# Patient Record
Sex: Female | Born: 1950 | Race: White | Hispanic: No | Marital: Married | Smoking: Never smoker
Health system: Southern US, Community
[De-identification: ages and names within clinical notes are randomized; demographics above are authoritative.]

## PROBLEM LIST (undated history)

## (undated) DIAGNOSIS — J189 Pneumonia, unspecified organism: Secondary | ICD-10-CM

## (undated) DIAGNOSIS — R0602 Shortness of breath: Secondary | ICD-10-CM

## (undated) DIAGNOSIS — I341 Nonrheumatic mitral (valve) prolapse: Secondary | ICD-10-CM

## (undated) DIAGNOSIS — R296 Repeated falls: Secondary | ICD-10-CM

## (undated) DIAGNOSIS — Z87898 Personal history of other specified conditions: Secondary | ICD-10-CM

## (undated) DIAGNOSIS — R0600 Dyspnea, unspecified: Secondary | ICD-10-CM

## (undated) DIAGNOSIS — R5383 Other fatigue: Secondary | ICD-10-CM

## (undated) DIAGNOSIS — K219 Gastro-esophageal reflux disease without esophagitis: Secondary | ICD-10-CM

## (undated) DIAGNOSIS — M3500 Sicca syndrome, unspecified: Secondary | ICD-10-CM

## (undated) DIAGNOSIS — R208 Other disturbances of skin sensation: Secondary | ICD-10-CM

## (undated) DIAGNOSIS — R911 Solitary pulmonary nodule: Secondary | ICD-10-CM

## (undated) DIAGNOSIS — J45909 Unspecified asthma, uncomplicated: Secondary | ICD-10-CM

## (undated) DIAGNOSIS — N319 Neuromuscular dysfunction of bladder, unspecified: Secondary | ICD-10-CM

## (undated) DIAGNOSIS — Z8489 Family history of other specified conditions: Secondary | ICD-10-CM

## (undated) DIAGNOSIS — G373 Acute transverse myelitis in demyelinating disease of central nervous system: Secondary | ICD-10-CM

## (undated) DIAGNOSIS — R112 Nausea with vomiting, unspecified: Secondary | ICD-10-CM

## (undated) DIAGNOSIS — R011 Cardiac murmur, unspecified: Secondary | ICD-10-CM

## (undated) DIAGNOSIS — R159 Full incontinence of feces: Secondary | ICD-10-CM

## (undated) DIAGNOSIS — R261 Paralytic gait: Secondary | ICD-10-CM

## (undated) DIAGNOSIS — Z978 Presence of other specified devices: Secondary | ICD-10-CM

## (undated) DIAGNOSIS — Z860100 Personal history of colon polyps, unspecified: Secondary | ICD-10-CM

## (undated) DIAGNOSIS — G35 Multiple sclerosis: Secondary | ICD-10-CM

## (undated) DIAGNOSIS — Z8719 Personal history of other diseases of the digestive system: Secondary | ICD-10-CM

## (undated) DIAGNOSIS — Z9889 Other specified postprocedural states: Secondary | ICD-10-CM

## (undated) DIAGNOSIS — M1990 Unspecified osteoarthritis, unspecified site: Secondary | ICD-10-CM

## (undated) DIAGNOSIS — G43909 Migraine, unspecified, not intractable, without status migrainosus: Secondary | ICD-10-CM

## (undated) HISTORY — PX: HEMORROIDECTOMY: SUR656

## (undated) HISTORY — PX: BREAST ENHANCEMENT SURGERY: SHX7

## (undated) HISTORY — PX: ANTERIOR CERVICAL DECOMP/DISCECTOMY FUSION: SHX1161

## (undated) HISTORY — DX: Acute transverse myelitis in demyelinating disease of central nervous system: G37.3

## (undated) HISTORY — DX: Multiple sclerosis: G35

## (undated) HISTORY — PX: TONSILLECTOMY: SUR1361

## (undated) HISTORY — PX: BUNIONECTOMY: SHX129

## (undated) HISTORY — PX: OTHER SURGICAL HISTORY: SHX169

## (undated) HISTORY — PX: SHOULDER ARTHROSCOPY: SHX128

## (undated) HISTORY — PX: ELBOW SURGERY: SHX618

---

## 1979-12-28 HISTORY — PX: VAGINAL HYSTERECTOMY: SUR661

## 1980-12-27 HISTORY — PX: BILATERAL SALPINGOOPHORECTOMY: SHX1223

## 1987-12-28 DIAGNOSIS — G35 Multiple sclerosis: Secondary | ICD-10-CM

## 1987-12-28 HISTORY — DX: Multiple sclerosis: G35

## 1992-12-27 HISTORY — PX: BLADDER SUSPENSION: SHX72

## 1998-03-27 ENCOUNTER — Encounter: Admission: RE | Admit: 1998-03-27 | Discharge: 1998-06-25 | Payer: Self-pay | Admitting: Family Medicine

## 1998-06-26 ENCOUNTER — Encounter: Admission: RE | Admit: 1998-06-26 | Discharge: 1998-09-24 | Payer: Self-pay | Admitting: Family Medicine

## 1998-11-05 ENCOUNTER — Other Ambulatory Visit: Admission: RE | Admit: 1998-11-05 | Discharge: 1998-11-05 | Payer: Self-pay | Admitting: *Deleted

## 1999-12-22 ENCOUNTER — Other Ambulatory Visit: Admission: RE | Admit: 1999-12-22 | Discharge: 1999-12-22 | Payer: Self-pay | Admitting: *Deleted

## 2000-05-11 ENCOUNTER — Ambulatory Visit (HOSPITAL_COMMUNITY): Admission: RE | Admit: 2000-05-11 | Discharge: 2000-05-11 | Payer: Self-pay | Admitting: Gastroenterology

## 2000-12-23 ENCOUNTER — Other Ambulatory Visit: Admission: RE | Admit: 2000-12-23 | Discharge: 2000-12-23 | Payer: Self-pay | Admitting: *Deleted

## 2002-01-04 ENCOUNTER — Other Ambulatory Visit: Admission: RE | Admit: 2002-01-04 | Discharge: 2002-01-04 | Payer: Self-pay | Admitting: *Deleted

## 2002-10-07 ENCOUNTER — Emergency Department (HOSPITAL_COMMUNITY): Admission: EM | Admit: 2002-10-07 | Discharge: 2002-10-07 | Payer: Self-pay | Admitting: Emergency Medicine

## 2003-01-07 ENCOUNTER — Other Ambulatory Visit: Admission: RE | Admit: 2003-01-07 | Discharge: 2003-01-07 | Payer: Self-pay | Admitting: Obstetrics & Gynecology

## 2003-05-08 ENCOUNTER — Inpatient Hospital Stay (HOSPITAL_COMMUNITY): Admission: RE | Admit: 2003-05-08 | Discharge: 2003-05-09 | Payer: Self-pay | Admitting: Neurosurgery

## 2003-05-08 ENCOUNTER — Encounter: Payer: Self-pay | Admitting: Neurosurgery

## 2003-07-10 ENCOUNTER — Ambulatory Visit (HOSPITAL_COMMUNITY): Admission: RE | Admit: 2003-07-10 | Discharge: 2003-07-10 | Payer: Self-pay | Admitting: Neurosurgery

## 2003-07-10 ENCOUNTER — Encounter: Payer: Self-pay | Admitting: Neurosurgery

## 2003-12-13 ENCOUNTER — Ambulatory Visit (HOSPITAL_COMMUNITY): Admission: RE | Admit: 2003-12-13 | Discharge: 2003-12-13 | Payer: Self-pay | Admitting: Neurosurgery

## 2004-01-10 ENCOUNTER — Other Ambulatory Visit: Admission: RE | Admit: 2004-01-10 | Discharge: 2004-01-10 | Payer: Self-pay | Admitting: Obstetrics and Gynecology

## 2004-04-28 ENCOUNTER — Encounter: Admission: RE | Admit: 2004-04-28 | Discharge: 2004-06-25 | Payer: Self-pay | Admitting: *Deleted

## 2004-12-03 ENCOUNTER — Encounter: Admission: RE | Admit: 2004-12-03 | Discharge: 2005-01-11 | Payer: Self-pay | Admitting: Neurology

## 2005-01-20 ENCOUNTER — Ambulatory Visit (HOSPITAL_COMMUNITY): Admission: RE | Admit: 2005-01-20 | Discharge: 2005-01-20 | Payer: Self-pay | Admitting: Neurology

## 2005-02-12 ENCOUNTER — Ambulatory Visit (HOSPITAL_COMMUNITY): Admission: RE | Admit: 2005-02-12 | Discharge: 2005-02-12 | Payer: Self-pay | Admitting: Neurosurgery

## 2005-02-15 ENCOUNTER — Inpatient Hospital Stay (HOSPITAL_COMMUNITY): Admission: RE | Admit: 2005-02-15 | Discharge: 2005-02-17 | Payer: Self-pay | Admitting: Neurosurgery

## 2005-03-25 ENCOUNTER — Ambulatory Visit (HOSPITAL_COMMUNITY): Admission: RE | Admit: 2005-03-25 | Discharge: 2005-03-25 | Payer: Self-pay | Admitting: Neurosurgery

## 2005-04-21 ENCOUNTER — Encounter: Admission: RE | Admit: 2005-04-21 | Discharge: 2005-07-12 | Payer: Self-pay | Admitting: Neurology

## 2005-08-11 ENCOUNTER — Ambulatory Visit (HOSPITAL_COMMUNITY): Admission: RE | Admit: 2005-08-11 | Discharge: 2005-08-11 | Payer: Self-pay | Admitting: Neurosurgery

## 2005-10-13 ENCOUNTER — Ambulatory Visit (HOSPITAL_COMMUNITY): Admission: RE | Admit: 2005-10-13 | Discharge: 2005-10-13 | Payer: Self-pay | Admitting: Orthopedic Surgery

## 2005-10-13 ENCOUNTER — Ambulatory Visit (HOSPITAL_BASED_OUTPATIENT_CLINIC_OR_DEPARTMENT_OTHER): Admission: RE | Admit: 2005-10-13 | Discharge: 2005-10-13 | Payer: Self-pay | Admitting: Orthopedic Surgery

## 2005-11-02 ENCOUNTER — Encounter: Admission: RE | Admit: 2005-11-02 | Discharge: 2006-01-05 | Payer: Self-pay | Admitting: Orthopedic Surgery

## 2006-05-24 ENCOUNTER — Ambulatory Visit (HOSPITAL_COMMUNITY): Admission: EM | Admit: 2006-05-24 | Discharge: 2006-05-24 | Payer: Self-pay | Admitting: *Deleted

## 2006-07-21 ENCOUNTER — Ambulatory Visit (HOSPITAL_COMMUNITY): Admission: RE | Admit: 2006-07-21 | Discharge: 2006-07-21 | Payer: Self-pay | Admitting: Neurosurgery

## 2006-10-25 ENCOUNTER — Emergency Department (HOSPITAL_COMMUNITY): Admission: EM | Admit: 2006-10-25 | Discharge: 2006-10-26 | Payer: Self-pay | Admitting: Emergency Medicine

## 2007-07-12 ENCOUNTER — Encounter: Admission: RE | Admit: 2007-07-12 | Discharge: 2007-08-24 | Payer: Self-pay | Admitting: Neurology

## 2007-07-24 ENCOUNTER — Ambulatory Visit (HOSPITAL_COMMUNITY): Admission: RE | Admit: 2007-07-24 | Discharge: 2007-07-24 | Payer: Self-pay | Admitting: Neurology

## 2007-08-24 ENCOUNTER — Encounter: Admission: RE | Admit: 2007-08-24 | Discharge: 2007-08-24 | Payer: Self-pay | Admitting: Neurology

## 2007-09-21 ENCOUNTER — Ambulatory Visit (HOSPITAL_BASED_OUTPATIENT_CLINIC_OR_DEPARTMENT_OTHER): Admission: RE | Admit: 2007-09-21 | Discharge: 2007-09-21 | Payer: Self-pay | Admitting: Orthopedic Surgery

## 2007-09-21 HISTORY — PX: SHOULDER ARTHROSCOPY WITH DISTAL CLAVICLE RESECTION: SHX5675

## 2007-09-28 ENCOUNTER — Encounter: Admission: RE | Admit: 2007-09-28 | Discharge: 2007-10-25 | Payer: Self-pay | Admitting: Orthopedic Surgery

## 2007-10-26 ENCOUNTER — Encounter: Admission: RE | Admit: 2007-10-26 | Discharge: 2007-11-21 | Payer: Self-pay | Admitting: Orthopedic Surgery

## 2007-11-22 ENCOUNTER — Encounter: Admission: RE | Admit: 2007-11-22 | Discharge: 2007-12-27 | Payer: Self-pay | Admitting: Orthopedic Surgery

## 2007-12-25 ENCOUNTER — Encounter: Admission: RE | Admit: 2007-12-25 | Discharge: 2007-12-25 | Payer: Self-pay | Admitting: Gastroenterology

## 2007-12-27 ENCOUNTER — Encounter: Admission: RE | Admit: 2007-12-27 | Discharge: 2007-12-27 | Payer: Self-pay | Admitting: Neurosurgery

## 2008-01-18 ENCOUNTER — Encounter: Admission: RE | Admit: 2008-01-18 | Discharge: 2008-03-04 | Payer: Self-pay | Admitting: Neurology

## 2008-02-05 ENCOUNTER — Encounter: Admission: RE | Admit: 2008-02-05 | Discharge: 2008-02-05 | Payer: Self-pay | Admitting: Orthopedic Surgery

## 2008-03-13 ENCOUNTER — Ambulatory Visit: Payer: Self-pay | Admitting: Internal Medicine

## 2008-03-13 DIAGNOSIS — R059 Cough, unspecified: Secondary | ICD-10-CM | POA: Insufficient documentation

## 2008-03-13 DIAGNOSIS — G43909 Migraine, unspecified, not intractable, without status migrainosus: Secondary | ICD-10-CM

## 2008-03-13 DIAGNOSIS — J310 Chronic rhinitis: Secondary | ICD-10-CM

## 2008-03-13 DIAGNOSIS — G35 Multiple sclerosis: Secondary | ICD-10-CM

## 2008-03-13 DIAGNOSIS — R05 Cough: Secondary | ICD-10-CM

## 2008-03-13 HISTORY — DX: Chronic rhinitis: J31.0

## 2008-03-13 HISTORY — DX: Migraine, unspecified, not intractable, without status migrainosus: G43.909

## 2008-03-18 ENCOUNTER — Encounter: Payer: Self-pay | Admitting: Internal Medicine

## 2008-05-16 ENCOUNTER — Encounter: Payer: Self-pay | Admitting: Internal Medicine

## 2008-05-28 ENCOUNTER — Ambulatory Visit (HOSPITAL_COMMUNITY): Admission: RE | Admit: 2008-05-28 | Discharge: 2008-05-28 | Payer: Self-pay | Admitting: Gastroenterology

## 2008-06-04 ENCOUNTER — Encounter: Payer: Self-pay | Admitting: Internal Medicine

## 2008-06-24 ENCOUNTER — Encounter: Admission: RE | Admit: 2008-06-24 | Discharge: 2008-06-24 | Payer: Self-pay | Admitting: Sports Medicine

## 2008-07-02 ENCOUNTER — Encounter: Admission: RE | Admit: 2008-07-02 | Discharge: 2008-09-11 | Payer: Self-pay | Admitting: Sports Medicine

## 2008-07-10 ENCOUNTER — Encounter: Admission: RE | Admit: 2008-07-10 | Discharge: 2008-07-10 | Payer: Self-pay | Admitting: Sports Medicine

## 2008-08-12 ENCOUNTER — Inpatient Hospital Stay (HOSPITAL_COMMUNITY): Admission: EM | Admit: 2008-08-12 | Discharge: 2008-08-13 | Payer: Self-pay | Admitting: Emergency Medicine

## 2008-09-10 ENCOUNTER — Ambulatory Visit: Payer: Self-pay | Admitting: Critical Care Medicine

## 2008-09-10 DIAGNOSIS — J441 Chronic obstructive pulmonary disease with (acute) exacerbation: Secondary | ICD-10-CM

## 2008-09-10 DIAGNOSIS — K219 Gastro-esophageal reflux disease without esophagitis: Secondary | ICD-10-CM | POA: Insufficient documentation

## 2008-09-10 HISTORY — DX: Gastro-esophageal reflux disease without esophagitis: K21.9

## 2008-09-10 HISTORY — DX: Chronic obstructive pulmonary disease with (acute) exacerbation: J44.1

## 2008-09-11 ENCOUNTER — Encounter: Payer: Self-pay | Admitting: Critical Care Medicine

## 2008-09-24 ENCOUNTER — Ambulatory Visit: Payer: Self-pay | Admitting: Critical Care Medicine

## 2008-09-24 DIAGNOSIS — R0602 Shortness of breath: Secondary | ICD-10-CM | POA: Insufficient documentation

## 2008-09-25 ENCOUNTER — Encounter: Payer: Self-pay | Admitting: Critical Care Medicine

## 2008-10-14 ENCOUNTER — Ambulatory Visit: Payer: Self-pay | Admitting: Critical Care Medicine

## 2008-10-24 ENCOUNTER — Telehealth (INDEPENDENT_AMBULATORY_CARE_PROVIDER_SITE_OTHER): Payer: Self-pay | Admitting: *Deleted

## 2008-10-24 ENCOUNTER — Ambulatory Visit: Payer: Self-pay | Admitting: Internal Medicine

## 2008-11-01 ENCOUNTER — Ambulatory Visit (HOSPITAL_COMMUNITY): Admission: RE | Admit: 2008-11-01 | Discharge: 2008-11-01 | Payer: Self-pay | Admitting: Obstetrics

## 2008-11-07 ENCOUNTER — Encounter: Admission: RE | Admit: 2008-11-07 | Discharge: 2008-12-06 | Payer: Self-pay | Admitting: Sports Medicine

## 2008-12-03 HISTORY — PX: BREAST IMPLANT REMOVAL: SHX5361

## 2009-01-17 ENCOUNTER — Encounter: Admission: RE | Admit: 2009-01-17 | Discharge: 2009-01-17 | Payer: Self-pay | Admitting: Neurosurgery

## 2009-01-17 ENCOUNTER — Encounter: Payer: Self-pay | Admitting: Critical Care Medicine

## 2009-01-21 ENCOUNTER — Encounter: Payer: Self-pay | Admitting: Critical Care Medicine

## 2009-01-22 ENCOUNTER — Ambulatory Visit: Payer: Self-pay | Admitting: Critical Care Medicine

## 2009-01-22 DIAGNOSIS — J984 Other disorders of lung: Secondary | ICD-10-CM

## 2009-01-22 DIAGNOSIS — R911 Solitary pulmonary nodule: Secondary | ICD-10-CM | POA: Insufficient documentation

## 2009-01-22 HISTORY — DX: Solitary pulmonary nodule: R91.1

## 2009-04-03 ENCOUNTER — Encounter: Admission: RE | Admit: 2009-04-03 | Discharge: 2009-04-03 | Payer: Self-pay | Admitting: Gastroenterology

## 2009-04-07 ENCOUNTER — Ambulatory Visit: Payer: Self-pay | Admitting: Internal Medicine

## 2009-04-15 ENCOUNTER — Ambulatory Visit: Payer: Self-pay | Admitting: Critical Care Medicine

## 2009-06-18 ENCOUNTER — Ambulatory Visit: Payer: Self-pay | Admitting: Critical Care Medicine

## 2009-08-25 ENCOUNTER — Encounter: Admission: RE | Admit: 2009-08-25 | Discharge: 2009-11-23 | Payer: Self-pay | Admitting: Neurology

## 2009-09-15 ENCOUNTER — Encounter: Payer: Self-pay | Admitting: Critical Care Medicine

## 2009-10-15 ENCOUNTER — Ambulatory Visit: Payer: Self-pay | Admitting: Genetic Counselor

## 2009-10-21 ENCOUNTER — Ambulatory Visit: Payer: Self-pay | Admitting: Critical Care Medicine

## 2009-10-21 DIAGNOSIS — R1312 Dysphagia, oropharyngeal phase: Secondary | ICD-10-CM | POA: Insufficient documentation

## 2009-10-21 DIAGNOSIS — J383 Other diseases of vocal cords: Secondary | ICD-10-CM

## 2009-10-21 HISTORY — DX: Other diseases of vocal cords: J38.3

## 2009-10-21 HISTORY — DX: Dysphagia, oropharyngeal phase: R13.12

## 2009-10-29 ENCOUNTER — Telehealth: Payer: Self-pay | Admitting: Critical Care Medicine

## 2009-10-30 ENCOUNTER — Encounter: Payer: Self-pay | Admitting: Critical Care Medicine

## 2009-10-30 ENCOUNTER — Ambulatory Visit (HOSPITAL_COMMUNITY): Admission: RE | Admit: 2009-10-30 | Discharge: 2009-10-30 | Payer: Self-pay | Admitting: Critical Care Medicine

## 2009-11-03 ENCOUNTER — Encounter: Payer: Self-pay | Admitting: Critical Care Medicine

## 2009-11-07 ENCOUNTER — Ambulatory Visit (HOSPITAL_COMMUNITY): Admission: RE | Admit: 2009-11-07 | Discharge: 2009-11-07 | Payer: Self-pay | Admitting: Obstetrics

## 2009-11-24 ENCOUNTER — Encounter: Admission: RE | Admit: 2009-11-24 | Discharge: 2009-12-26 | Payer: Self-pay | Admitting: Critical Care Medicine

## 2009-12-05 ENCOUNTER — Encounter: Payer: Self-pay | Admitting: Critical Care Medicine

## 2009-12-25 ENCOUNTER — Telehealth: Payer: Self-pay | Admitting: Critical Care Medicine

## 2009-12-27 DIAGNOSIS — C801 Malignant (primary) neoplasm, unspecified: Secondary | ICD-10-CM

## 2009-12-27 HISTORY — DX: Malignant (primary) neoplasm, unspecified: C80.1

## 2010-01-03 ENCOUNTER — Encounter: Admission: RE | Admit: 2010-01-03 | Discharge: 2010-01-03 | Payer: Self-pay | Admitting: Psychiatry

## 2010-01-07 ENCOUNTER — Telehealth: Payer: Self-pay | Admitting: Critical Care Medicine

## 2010-01-07 ENCOUNTER — Encounter: Admission: RE | Admit: 2010-01-07 | Discharge: 2010-04-07 | Payer: Self-pay | Admitting: Critical Care Medicine

## 2010-01-15 ENCOUNTER — Encounter: Payer: Self-pay | Admitting: Critical Care Medicine

## 2010-01-15 ENCOUNTER — Ambulatory Visit
Admission: RE | Admit: 2010-01-15 | Discharge: 2010-01-15 | Payer: Self-pay | Source: Home / Self Care | Admitting: Critical Care Medicine

## 2010-01-23 ENCOUNTER — Encounter: Admission: RE | Admit: 2010-01-23 | Discharge: 2010-02-04 | Payer: Self-pay | Admitting: Neurology

## 2010-01-30 ENCOUNTER — Encounter: Payer: Self-pay | Admitting: Critical Care Medicine

## 2010-04-14 ENCOUNTER — Encounter (HOSPITAL_COMMUNITY): Admission: RE | Admit: 2010-04-14 | Discharge: 2010-07-13 | Payer: Self-pay | Admitting: Neurology

## 2010-04-21 ENCOUNTER — Telehealth: Payer: Self-pay | Admitting: Critical Care Medicine

## 2010-04-22 ENCOUNTER — Ambulatory Visit: Payer: Self-pay | Admitting: Critical Care Medicine

## 2010-08-04 ENCOUNTER — Encounter (HOSPITAL_COMMUNITY): Admission: RE | Admit: 2010-08-04 | Discharge: 2010-11-02 | Payer: Self-pay | Admitting: Neurology

## 2010-08-05 ENCOUNTER — Ambulatory Visit (HOSPITAL_COMMUNITY): Admission: RE | Admit: 2010-08-05 | Discharge: 2010-08-05 | Payer: Self-pay | Admitting: General Surgery

## 2010-10-02 ENCOUNTER — Encounter: Admission: RE | Admit: 2010-10-02 | Discharge: 2010-10-02 | Payer: Self-pay | Admitting: Neurology

## 2010-11-25 ENCOUNTER — Encounter (HOSPITAL_COMMUNITY)
Admission: RE | Admit: 2010-11-25 | Discharge: 2011-01-26 | Payer: Self-pay | Source: Home / Self Care | Attending: Neurology | Admitting: Neurology

## 2010-12-16 ENCOUNTER — Encounter
Admission: RE | Admit: 2010-12-16 | Discharge: 2010-12-24 | Payer: Self-pay | Source: Home / Self Care | Attending: Sports Medicine | Admitting: Sports Medicine

## 2010-12-29 ENCOUNTER — Encounter
Admission: RE | Admit: 2010-12-29 | Discharge: 2011-01-26 | Payer: Self-pay | Source: Home / Self Care | Attending: Sports Medicine | Admitting: Sports Medicine

## 2011-01-07 ENCOUNTER — Encounter
Admission: RE | Admit: 2011-01-07 | Discharge: 2011-01-26 | Payer: Self-pay | Source: Home / Self Care | Attending: Obstetrics and Gynecology | Admitting: Obstetrics and Gynecology

## 2011-01-07 ENCOUNTER — Encounter: Admit: 2011-01-07 | Payer: Self-pay | Admitting: Sports Medicine

## 2011-01-12 ENCOUNTER — Encounter: Admit: 2011-01-12 | Payer: Self-pay | Admitting: Sports Medicine

## 2011-01-15 ENCOUNTER — Encounter: Admit: 2011-01-15 | Payer: Self-pay | Admitting: Sports Medicine

## 2011-01-17 ENCOUNTER — Encounter: Payer: Self-pay | Admitting: Critical Care Medicine

## 2011-01-17 ENCOUNTER — Encounter: Payer: Self-pay | Admitting: Neurology

## 2011-01-26 NOTE — Progress Notes (Signed)
Summary: appointment  Phone Note Call from Patient   Caller: x-ray//[name] Call For: [name] Summary of Call: FYI:  Patient cancelled her appointment yesterday 1/11. Initial call taken by: Darletta Moll,  January 07, 2010 11:25 AM  Follow-up for Phone Call        will forward to PW as FYI.  Gweneth Dimitri RN  January 07, 2010 12:30 PM   Additional Follow-up for Phone Call Additional follow up Details #1::        noted   Additional Follow-up by: Storm Frisk MD,  January 07, 2010 1:45 PM

## 2011-01-26 NOTE — Progress Notes (Signed)
Summary: appt  Phone Note Call from Patient Call back at [phone]   Caller: Patient Call For: Suhan Paci Reason for Call: Talk to Nurse Summary of Call: pt c/o cough, more she talks more cough, just started tysabri infusion, have pockets of emphysema.  Feels like she's in a smoke filled room.  Nothing has helped.  Need to see PW.  Need to make sure she isn't contagious. Initial call taken by: Eugene Gavia,  April 21, 2010 8:08 AM  Follow-up for Phone Call        Pt c/o productive cough with white phlegm, increased SOB, chest congestion x 3 days.  Pt states she is on Tysabri infusion for her MS and she has to be careful what meds she takes. She states Dr. Sandria Manly cleared her to take Sudafed, but this has not helped. SHe denies fever. She is asking for rx for this as well as wants to know is she contagious? She has a sister going through chemo and she wants to know should she stay away form her? Also pt states she cannot take prednisone 2 weeks before an infusion or she will have to skip her treatment. her next infusion date is May 17th.  Please advise.  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 She is scheduled to see MW tomorrow 04/22/10 at 11:55. pt aware if gets worse to call our office or go to ER. Carron Curie CMA  April 21, 2010 10:02 AM

## 2011-01-26 NOTE — Miscellaneous (Signed)
Summary: Discharge Summary/[facility]  Discharge Summary/[facility]   Imported By: Sherian Rein 02/17/2010 07:27:16  _____________________________________________________________________  External Attachment:    Type:   Image     Comment:   External Document

## 2011-01-26 NOTE — Assessment & Plan Note (Signed)
Summary: Pulmonary/ acute exac cough    Copy to:  Avie Echevaria Primary Provider/Referring Provider:  Theresia Lo  CC:  Acute visit.  Pt c/o increased SOB and chest congestion x 8 days.  She states that she gets SOB with just bending over.  She also has had prod cough with yellow sputum.  Denies fever.Marland Kitchen  History of Present Illness: 69  yowf never smoker with MS since 1989 weak tingling bladder incont  New onset cough 3/09 felt initially to be upper airway syndrome rhinitis vs GERD.   June 18, 2009 Since last ov heartburn and cough are much better.  Now off copaxane.  Now is more weak with MS. Dyspnea is stable.  Trying to follow diet better > upper airway symptoms better.   October 21, 2009  dx laryngitis and bronchitis 2 weeks prior to ov per pcp:  rx avelox x 7 days and 6 days of pred dose pak. better,  less hoarse cc cough now is less productive,  heartburn is better  Regarding her MS:  was off mercaptopurine and folic acid   not working ,  more weakness in legs in arms ? new oral meds to be considered per neurology The pt has more difficulty with liquids than solid food and will aspirate and cough after eating. The pt had a swallow eval 2009   April 22, 2010 Acute visit.  Pt c/o increased SOB and chest congestion x 8 days with SOB with just bending over.  She also has had prod cough with yellow sputum.   Pt denies any significant sore throat, dysphagia, itching, sneezing,  nasal congestion or excess secretions,  fever, chills, sweats, unintended wt loss, pleuritic or exertional cp, hempoptysis, change in activity tolerance  orthopnea pnd or leg swelling Pt also denies any obvious fluctuation in symptoms with weather or environmental change or other alleviating or aggravating factors.       Current Medications (verified): 1)  Premarin 0.3 Mg  Tabs (Estrogens Conjugated) .... Once Daily 2)  Loratadine 10 Mg  Tabs (Loratadine) .... Once Daily 3)  Lyrica 50 Mg Caps (Pregabalin) .... 2 By  Mouth At Bedtime 4)  Multivitamins   Tabs (Multiple Vitamin) .... Once Daily 5)  Calcium-Vitamin D 600-125 Mg-Unit  Tabs (Calcium-Vitamin D) .... Three Times A Day 6)  Albuterol Sulfate (2.5 Mg/56ml) 0.083% Nebu (Albuterol Sulfate) .... 4-6 Times Daily As Needed 7)  Reclast 5 Mg/167ml Soln (Zoledronic Acid) .Marland Kitchen.. 1 Injection Yearly 8)  Tysabri 300 Mg/20ml Conc (Natalizumab) .Marland Kitchen.. 1 Infusion Monthly 9)  Omeprazole 40 Mg Cpdr (Omeprazole) .Marland Kitchen.. 1 Two Times A Day 10)  Premarin 0.625 Mg/gm Crea (Estrogens, Conjugated) .... As Directed Twice Weekly 11)  Topiramate 25 Mg Tabs (Topiramate) .... 2 Once Daily 12)  Treximet 85-500 Mg Tabs (Sumatriptan-Naproxen Sodium) .... As Directed As Needed 13)  Meclizine Hcl 25 Mg Chew (Meclizine Hcl) .... 1/2 Three Times A Day As Needed 14)  Symbicort 160-4.5 Mcg/act Aero (Budesonide-Formoterol Fumarate) .... 2 Puffs Two Times A Day 15)  Qvar 40 Mcg/act Aers (Beclomethasone Dipropionate) .... 2 Puffs Every 4-6 Hours As Needed  Allergies (verified): 1)  ! * Betaseron 2)  ! Imitrex 3)  ! * Latex 4)  Codeine  Past History:  Past Medical History: MIGRAINE, CHRONIC (ICD-346.90) MULTIPLE SCLEROSIS (ICD-340)............................................................Marland KitchenLove     -dx 33      - Tysabri started 04/14/2010 [place]  dr Rudy Jew D.....................................................................................................[place]   - Pos  Esophageal stricture Chronic Cough onset 3/09        -  eval 3/09 Kylie Gros---->cyclic cough/GERD likely dx      -  CT Chest  08/12/2008 :   centrilobular emphysema;  mild chronic bronchitic changes only      -  PFT's nl 09/24/08 Palpitations Mitral Valve Prolapse Sjorgren's syndrome    -Dx 2000  Mouth and eyes dry  Vital Signs:  Patient profile:   60 year old female Weight:      149.50 pounds O2 Sat:      100 % on Room air Temp:     97.6 degrees F oral Pulse rate:   87 / minute BP sitting:   128 / 78  (left  arm)  Vitals Entered By: Vernie Murders (April 22, 2010 12:15 PM)  O2 Flow:  Room air  Physical Exam  Additional Exam:  Ambulatory healthy appearing in no acute distress, pseudowheeze resolves with purse lip maneuver  wt 143 > 149 April 22, 2010  HEENT: nl dentition, turbinates, and orophanx. Neck without JVD/Nodes/TM Lungs  minimal bilateral end exp wheezes  RRR no s3 or murmur or increase in P2 Abd soft and benign with nl excursion in the supine position. No bruits or organomegaly Ext warm without calf tenderness, cyanosis clubbing or edema Skin warm and dry without lesions     Impression & Recommendations:  Problem # 1:  COUGH (ICD-786.2)  prob uri vs allergic rhinitis with exac of chronic cough.  Explained natural h/o uri and why it's necessary in patients at risk to rx short term with PPI to reduce risk of evolving cyclical cough triggered by epithelial injury and a heightened sensitivty to the effects of any upper airway irritants,  most importantly acid - related    Each maintenance medication was reviewed in detail including most importantly the difference between maintenance prns and under what circumstances the prns are to be used.  In addition, these two groups (for which the patient should keep up with refills) were distinguished from a third group :  meds that are used only short term with the intent to complete a course of therapy and then not refill them.  The med list was then fully reconciled and reorganized to reflect this important distinction. See instructions for specific recommendations   Orders: Est. Patient Level IV ([address])  Medications Added to Medication List This Visit: 1)  Tysabri 300 Mg/36ml Conc (Natalizumab) .Marland Kitchen.. 1 infusion monthly 2)  Omeprazole 40 Mg Cpdr (Omeprazole) .Marland Kitchen.. 1 two times a day 3)  Omeprazole 40 Mg Cpdr (Omeprazole) .... Take one 30-60 min before first and last meals of the day 4)  Premarin 0.625 Mg/gm Crea (Estrogens, conjugated) ....  As directed twice weekly 5)  Topiramate 25 Mg Tabs (Topiramate) .... 2 once daily 6)  Treximet 85-500 Mg Tabs (Sumatriptan-naproxen sodium) .... As directed as needed 7)  Meclizine Hcl 25 Mg Chew (Meclizine hcl) .... 1/2 three times a day as needed 8)  Symbicort 160-4.5 Mcg/act Aero (Budesonide-formoterol fumarate) .... 2 puffs two times a day 9)  Qvar 40 Mcg/act Aers (Beclomethasone dipropionate) .... 2 puffs every 4-6 hours as needed 10)  Xopenex 0.63 Mg/51ml Nebu (Levalbuterol hcl) .... One every 6 hours if cough short of breath wheeze 11)  Doxycycline Hyclate 100 Mg Tabs (Doxycycline hyclate) .... One twice daily with water before eat 12)  Prednisone 10 Mg Tabs (Prednisone) .... 4 each am x 2days, 2x2days, 1x2days and stop  Patient Instructions: 1)  Symbicort 160 2 puffs first thing  in am and 2 puffs again in pm about  12 hours later if any respiratory symptoms at all 2)  Xopenex 0.6 mg every 6 hours if needed for wheeze, cough, sob 3)  Doxy x 7 days should mucus back to clear 4)  Prednisone 4 each am x 2days, 2x2days, 1x2days and stop 5)  Delsym 2 tsp every 12 hours for cough as needed  6)  GERD (REFLUX)  is a common cause of respiratory symptoms. It commonly presents without heartburn and can be treated with medication, but also with lifestyle changes including avoidance of late meals, excessive alcohol, smoking cessation, and avoid fatty foods, chocolate, peppermint, colas, red wine, and acidic juices such as orange juice. NO MINT OR MENTHOL PRODUCTS SO NO COUGH DROPS  7)  USE SUGARLESS CANDY INSTEAD (jolley ranchers)  8)  NO OIL BASED VITAMINS  9)  call if not improving  Prescriptions: PREDNISONE 10 MG  TABS (PREDNISONE) 4 each am x 2days, 2x2days, 1x2days and stop  #14 x 0   Entered and Authorized by:   Nyoka Cowden MD   Signed by:   Nyoka Cowden MD on 04/22/2010   Method used:   Electronically to        [facility] (retail)       [address]       [place], [place]  [address]       Ph: [phone]       Fax: [phone]   RxID:   [id number] DOXYCYCLINE HYCLATE 100 MG TABS (DOXYCYCLINE HYCLATE) one twice daily with water before eat  #14 x 0   Entered and Authorized by:   Nyoka Cowden MD   Signed by:   Nyoka Cowden MD on 04/22/2010   Method used:   Electronically to        [facility] (retail)       [address]       [place], [place]  [address]       Ph: [phone]       Fax: [phone]   RxID:   [id number] SYMBICORT 160-4.5 MCG/ACT AERO (BUDESONIDE-FORMOTEROL FUMARATE) 2 puffs two times a day  #1 x 11   Entered and Authorized by:   Nyoka Cowden MD   Signed by:   Nyoka Cowden MD on 04/22/2010   Method used:   Electronically to        [facility] [place] [facility] (retail)       [address]       [place], [place]  [address]       Ph: [phone]       Fax: [phone]   RxID:   [id number] XOPENEX 0.63 MG/3ML NEBU (LEVALBUTEROL HCL) one every 6 hours if cough short of breath wheeze  #25 x 11   Entered and Authorized by:   Nyoka Cowden MD   Signed by:   Nyoka Cowden MD on 04/22/2010   Method used:   Electronically to        [facility] (retail)       [address]       [place], [place]  [address]       Ph: [phone]       Fax: [phone]  RxID:   [id number]

## 2011-01-28 ENCOUNTER — Ambulatory Visit: Payer: Medicare Other | Attending: Sports Medicine | Admitting: Physical Therapy

## 2011-01-28 ENCOUNTER — Ambulatory Visit: Payer: Medicare Other | Attending: Obstetrics and Gynecology | Admitting: Physical Therapy

## 2011-01-28 DIAGNOSIS — M545 Low back pain, unspecified: Secondary | ICD-10-CM | POA: Insufficient documentation

## 2011-01-28 DIAGNOSIS — R262 Difficulty in walking, not elsewhere classified: Secondary | ICD-10-CM | POA: Insufficient documentation

## 2011-01-28 DIAGNOSIS — IMO0001 Reserved for inherently not codable concepts without codable children: Secondary | ICD-10-CM | POA: Insufficient documentation

## 2011-01-28 DIAGNOSIS — M25559 Pain in unspecified hip: Secondary | ICD-10-CM | POA: Insufficient documentation

## 2011-02-02 ENCOUNTER — Ambulatory Visit: Payer: Medicare Other | Admitting: Physical Therapy

## 2011-02-04 ENCOUNTER — Ambulatory Visit: Payer: Medicare Other | Admitting: Physical Therapy

## 2011-02-09 ENCOUNTER — Ambulatory Visit: Payer: Medicare Other | Admitting: Physical Therapy

## 2011-02-11 ENCOUNTER — Ambulatory Visit: Payer: Medicare Other | Admitting: Physical Therapy

## 2011-02-16 ENCOUNTER — Ambulatory Visit: Payer: Medicare Other | Admitting: Physical Therapy

## 2011-02-18 ENCOUNTER — Ambulatory Visit: Payer: Medicare Other | Admitting: Physical Therapy

## 2011-02-23 ENCOUNTER — Ambulatory Visit: Payer: Medicare Other | Admitting: Physical Therapy

## 2011-02-23 ENCOUNTER — Encounter (HOSPITAL_COMMUNITY)
Admission: RE | Admit: 2011-02-23 | Discharge: 2011-02-23 | Disposition: A | Discharge status: Home / Self Care | Payer: Medicare Other | Source: Ambulatory Visit | Attending: Neurology | Admitting: Neurology

## 2011-02-23 DIAGNOSIS — M810 Age-related osteoporosis without current pathological fracture: Secondary | ICD-10-CM | POA: Insufficient documentation

## 2011-02-23 DIAGNOSIS — G35 Multiple sclerosis: Secondary | ICD-10-CM | POA: Insufficient documentation

## 2011-02-25 ENCOUNTER — Ambulatory Visit: Payer: Medicare Other | Attending: Sports Medicine | Admitting: Physical Therapy

## 2011-02-25 DIAGNOSIS — IMO0001 Reserved for inherently not codable concepts without codable children: Secondary | ICD-10-CM | POA: Insufficient documentation

## 2011-02-25 DIAGNOSIS — M25559 Pain in unspecified hip: Secondary | ICD-10-CM | POA: Insufficient documentation

## 2011-02-25 DIAGNOSIS — M545 Low back pain, unspecified: Secondary | ICD-10-CM | POA: Insufficient documentation

## 2011-02-25 DIAGNOSIS — R262 Difficulty in walking, not elsewhere classified: Secondary | ICD-10-CM | POA: Insufficient documentation

## 2011-03-02 ENCOUNTER — Ambulatory Visit: Payer: Medicare Other | Admitting: Physical Therapy

## 2011-03-04 ENCOUNTER — Ambulatory Visit: Payer: Medicare Other | Admitting: Physical Therapy

## 2011-03-09 ENCOUNTER — Encounter: Payer: Medicare Other | Admitting: Physical Therapy

## 2011-03-11 ENCOUNTER — Ambulatory Visit: Payer: Medicare Other | Admitting: Physical Therapy

## 2011-03-12 ENCOUNTER — Encounter: Payer: Medicare Other | Admitting: Physical Therapy

## 2011-03-12 LAB — BASIC METABOLIC PANEL
CO2: 30 mEq/L (ref 19–32)
Chloride: 107 mEq/L (ref 96–112)
GFR calc Af Amer: >60 mL/min (ref >60)
Potassium: 3.3 mEq/L — ABNORMAL LOW (ref 3.5–5.1)
Sodium: 140 mEq/L (ref 135–145)

## 2011-03-12 LAB — CBC
HCT: 38 % (ref 36.0–46.0)
Hemoglobin: 12.6 g/dL (ref 12.0–15.0)
MCH: 29.7 pg (ref 26.0–34.0)
MCHC: 33.2 g/dL (ref 30.0–36.0)
MCV: 89.6 fL (ref 78.0–100.0)
Platelets: 161 K/uL (ref 150–400)
RBC: 4.24 MIL/uL (ref 3.87–5.11)
RDW: 13.2 % (ref 11.5–15.5)
WBC: 5 K/uL (ref 4.0–10.5)

## 2011-03-16 ENCOUNTER — Ambulatory Visit: Payer: Medicare Other | Admitting: Physical Therapy

## 2011-03-23 ENCOUNTER — Ambulatory Visit (INDEPENDENT_AMBULATORY_CARE_PROVIDER_SITE_OTHER): Payer: Medicare Other

## 2011-03-23 DIAGNOSIS — G35 Multiple sclerosis: Secondary | ICD-10-CM

## 2011-05-11 NOTE — Discharge Summary (Signed)
[name], [name]            ACCOUNT NO.:  [id number]   MEDICAL RECORD NO.:  [id number]          PATIENT TYPE:  INP   LOCATION:  1502                         FACILITY[facility]  [facility]   PHYSICIAN:  Corinna L. Lendell Caprice, MDDATE OF BIRTH:  15-Jun-1951   DATE OF ADMISSION:  08/12/2008  DATE OF DISCHARGE:  08/13/2008                               DISCHARGE SUMMARY   DISCHARGE DIAGNOSES:  1. Acute bronchitis with bronchospasm and suspect underlying asthma.  2. Hypokalemia.  3. Steroid-induced hyperglycemia.  4. History of ulcerative colitis.  5. Multiple sclerosis.  6. Gastroesophageal reflux disease.  7. Osteoporosis.   DISCHARGE MEDICATIONS:  1. Avelox 400 mg a day until gone.  2. Prednisone taper.  3. Prilosec OTC daily.  4. Tessalon Perles 200 mg p.o. t.i.d. as needed for cough.  5. Continue Lyrica 100 mg 3 times a day.  6. Avonex.  7. Premarin 0.3 mg a day.  8. Mercaptopurine 25 mg daily.  9. Apriso 0.375 g 4 p.o. daily.  10.Folic acid 1 mg a day.  11.Topamax 20 mg a day.  12.Treximet 85/500 as needed for migraines.  13.Loratadine 10 mg a day.  14.Multivitamin a day.  15.Calcium with vitamin D 600 mg p.o. t.i.d.  16.Vitamin D 5000 international units weekly.  17.Actonel 150 mg monthly.  18.Mucinex as needed.  19.Albuterol MDI 2 puffs every 4 hours as needed.   CONDITION:  Stable.   ACTIVITY:  Increase slowly.   CONSULTATIONS:  None.   PROCEDURES:  None.   FOLLOWUP:  With primary care physician in 2-4 weeks.   DIET:  Regular.   LABORATORY DATA:  CBC unremarkable.  Basic metabolic panel on admission  significant for a potassium of 2.9, glucose of 288, and creatinine of  1.3.  Her hemoglobin A1c was 5.8.  At discharge, her potassium is 4.0  and her creatinine is 0.73.   SPECIAL STUDIES/RADIOLOGY:  Chest x-ray showed nothing acute.  CT of the  chest showed bronchitis and equivocal finding suggestive of mild central  lobular emphysema.   HISTORY AND HOSPITAL COURSE:   [name] is a pleasant 60 year old  white female who has been struggling with bronchitis for several weeks  now.  She had been on a Z-Pak and subsequently Avelox.  She also was  given an albuterol inhaler to use as needed.  She presented to the  emergency room due to severe shortness of breath and wheezing.  She does  not smoke, but she reports perfumes and secondhand cigarette smoke makes  her chest tight.  She initially was tachypneic.  She had a normal  oxygen saturation.  She had a cough, which was continuous.  She had dry  mucous membranes.  She had bilateral expiratory wheeze.  She was  admitted and started on steroids, handheld nebulizers, and antibiotics.  She also had hypokalemia and dehydration on admission, which was  corrected.  She had a lot of questions about hoarseness and her lungs  and her multiple sclerosis.  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  She reports occasional reflux and  has been on  reflux medication in the past, but she apparently only took  it as needed.  In addition to treating her acute bronchitis with  bronchospasm, we talked about starting a proton pump inhibitor back to  see if this would help.   At the time of discharge, her wheezing was much improved.  She had no  shortness of breath, and she was ready for discharge.  Total time on the  day of discharge is   Dictation ended at this point.      Corinna L. Lendell Caprice, MD  Electronically Signed     CLS/MEDQ  D:  08/13/2008  T:  08/14/2008  Job:  [id number]   cc:   Vikki Ports, M.D.

## 2011-05-11 NOTE — H&P (Signed)
[name], [name]            ACCOUNT NO.:  [id number]   MEDICAL RECORD NO.:  [id number]          PATIENT TYPE:  OBV   LOCATION:  0101                         [facility]ACILITY[facility]  [facility]   PHYSICIAN:  Hollice Espy, M.D.DATE OF BIRTH:  [birth year]   DATE OF ADMISSION:  08/12/2008  DATE OF DISCHARGE:                              HISTORY & PHYSICAL   PRIMARY CARE PHYSICIAN:  Dr. Lanell Persons.   CHIEF COMPLAINT:  Shortness of breath and wheezing.   HISTORY OF PRESENT ILLNESS:  The patient is a 60 year old white female  with past medical history of MS, migraines and ulcerative colitis who as  of late has been having problems with worsening shortness of breath and  wheezing.  She has been followed by her PCP and his partner after they  noted that her x-ray showed some increased atelectasis.  Last night she  was having especially marked shortness of breath and wheezing which  initially went away, but then today came back without cessation.  She  came in to the emergency room feeling very short of breath and wheezing  quite a bit.  She had recently been on a course of Z-Pak which had not  been done much since then to relieve her symptoms.  Which she initially  thought had helped her symptoms.  When she came the emergency room,  chest x-ray showed chronic lung changes, low lung volumes of vascular  crowding and streaky bibasilar atelectasis.  Labs were done.  She is  having normal white count with no shift.  However she was slightly  tachycardia with heart rate 117, and her respiratory rate was quite  labored.  She had 40 down to only as low as 26.  She was however, noted  to be satting 97% on room air.  The patient was given a dose of IV  antibiotics, multiple doses of nebulizer treatment and IV steroids, but  her wheezing quite persisted.  With these findings, it was felt best she  come for further evaluation and treatment.  Currently, she is doing  okay.  She still is having some  wheezing and shortness of breath.  She  denies any headaches, vision changes, dysphagia, no chest pain,  palpitations, some mild cough but nonproductive.  No abdominal pain.  No  hematuria, dysuria, constipation, diarrhea, no focal extremity numbness,  weakness or pain.   REVIEW OF SYSTEMS:  Otherwise negative.  The patient's past medical  history includes recently diagnosed ulcerative colitis and a previous  history of MS as well as osteoporosis.   MEDICATIONS:  1. Lyrica 100, believed to be t.i.d.  2. Avonex 30 mcg IM.  3. Premarin 0.2 mg p.o. daily.  4. Mercaptopurine 50 mg p.o. daily.  5. Topamax 25 mg p.o. b.i.d. p.r.n.  6. Treximet 85/500 p.o. p.r.n. migraines.  7. Claritin 10 p.o. daily.  8. Multivitamin p.o. daily.  9. Os-Cal D 700 mg p.o. daily.  10.Simply Sleep p.r.n.  11.Actonel 150 mg p.o. daily.  12.Mucinex 1200 p.o. daily.  13.Albuterol inhaler.   ALLERGIES:  CODEINE, IMITREX AND B INJECTION   SOCIAL HISTORY:  No tobacco, alcohol  or drug use.   FAMILY HISTORY:  Noncontributory.   PHYSICAL EXAMINATION:  VITAL SIGNS:  Vitals on admission temperature  97.7, heart rate 117, blood pressure 147/101, now down to 137/69,  respirations 40 now down to 26, O2 sat 95% on room air.  GENERAL:  She is alert and oriented x3 in no apparent distress.  HEENT:  Normocephalic atraumatic.  Mucous membranes are slightly dry.  She has no carotid bruits.  HEART:  Regular rhythm, mild tachycardia.  LUNGS:  She has bilateral expiratory wheezing throughout.  ABDOMEN:  Soft, nontender, nondistended.  Positive bowel sounds.  EXTREMITIES:  Clubbing, cyanosis, trace pitting edema.   LABORATORY DATA:  White count 3.7, H&H 13.4 and 39, MCV 94, platelet  count 156, 40% neutrophils.  Sodium 142, potassium 2.9, chloride 111,  BUN 15, creatinine 1.3, glucose 228.  Chest x-ray as per HPI.   ASSESSMENT/PLAN:  1. Bronchitis.  Will go ahead and check a CT scan of the chest given      reports of  PCP seeing previous lung issues to look for any other      type of underlying lung disease.  In the meantime, will treat with      IV antibiotics, round the clock nebulizer treatments and steroids      plus oxygen.  2. Hypokalemia.  Will replace.  3. Hyperglycemia.  I will give him these labs to be checked after she      got her dose of steroids.  Will cover with sliding scale.  Continue      to follow.  4. History of MS.  currently she is stable.  5. Ulcerative colitis.  Continue Apriso.      Hollice Espy, M.D.  Electronically Signed     SKK/MEDQ  D:  08/12/2008  T:  08/12/2008  Job:  [id number]   cc:   Vikki Ports, M.D.  Fax: [phone]

## 2011-05-11 NOTE — Op Note (Signed)
NAMECARSON, [name]            ACCOUNT NO.:  [id number]   MEDICAL RECORD NO.:  [id number]          PATIENT TYPE:  AMB   LOCATION:  DSC                          FACILITY:  [facility]   PHYSICIAN:  Loreta Ave, M.D. DATE OF BIRTH:  1951/05/06   DATE OF PROCEDURE:  09/21/2007  DATE OF DISCHARGE:  09/21/2007                               OPERATIVE REPORT   PREOPERATIVE DIAGNOSES:  1. Left shoulder impingement.  2. Distal clavicle osteolysis.   POSTOPERATIVE DIAGNOSES:  1. Left shoulder impingement.  2. Distal clavicle osteolysis.  3. Partial tearing of rotator cuff above and below.   PROCEDURE:  Left shoulder exam under anesthesia, arthroscopy,  debridement rotator cuff.  Acromioplasty, coracoacromial ligament  release, and excision of distal clavicle.   SURGEON:  Loreta Ave, M.D.   ASSISTANT:  Zonia Kief, P.A.   ANESTHESIA:  General.   BLOOD LOSS:  Minimal.   SPECIMENS:  None.   COMPLICATIONS:  None.   PROCEDURE:  Soft compressive with sling.   PROCEDURE:  The patient was brought to the operating room and placed on  the operative table in supine position.  After adequate anesthesia had  been obtained, left shoulder was examined.  Full motion and stable  shoulder.  Placed in a beach-chair position on the shoulder positioner,  prepped and draped in the usual sterile fashion.  Three portals,  anterior, posterior and lateral.  Shoulder entered with a blunt  obturator, arthroscope introduced, shoulder distended and inspected.  Partial tearing of the undersurface of the supraspinatus tendon and  subscapularis tendon all debrided.  No full-thickness tears.  Articular  cartilage, capsule, ligamentous structures, labrum, biceps tendon and  biceps anchor intact.  Cannula redirected subacromially.  Type II to III  acromion.  Obvious impingement.  Abrasive tearing on the top of the  cuff, the cuff debrided, bursa resected.  Acromioplasty to a type 1  acromion, releasing  the CA ligament.  Grade 3 changes approaching grade  4 AC joint.  Periarticular spurs and the lateral centimeter of clavicle  resected.  Adequacy of decompression confirmed, viewing from all  portals.  Instruments and fluid removed.  Portals in shoulder and bursa  injected with Marcaine.  Portals closed with 4-0 nylon.  Sterile  compressive dressing applied.  Anesthesia reversed.  Brought to recovery  room.  Tolerated surgery well.  No complications.      Loreta Ave, M.D.  Electronically Signed     DFM/MEDQ  D:  09/21/2007  T:  09/22/2007  Job:  [id number]

## 2011-05-14 ENCOUNTER — Other Ambulatory Visit: Payer: Self-pay | Admitting: Gastroenterology

## 2011-05-14 NOTE — Procedures (Signed)
[facility]  Patient:    [name], [name]                   MRN: [id number] Proc. Date: 05/11/00 Adm. Date:  [id number] Disc. Date: [id number] Attending:  Nelda Marseille CC:         Petra Kuba, M.D.             Vikki Ports, M.D.                           Procedure Report  PROCEDURE:  Colonoscopy with biopsy.  INDICATIONS FOR PROCEDURE:  This is a patient with history of bright red blood per rectum, IBS-like symptoms over the years, but actually those have improved.  Referred for colonoscopy.  Consent was signed after risks, benefits, methods, and options thoroughly discussed in the office.  MEDICINES USED:  Demerol 75, versed 7.  DESCRIPTION OF THE PROCEDURE:  Rectal inspection is pertinent for external hemorrhoids.  Digital exam was negative.  The video pediatric colonoscope was inserted and with some difficulty due to a tortuous spastic sigmoid.  Were able to be advanced to the cecum once past the sigmoid.  The scope passed easily around the colon.  This did not require any abdominal pressure or any position changes.  No obvious abnormality was seen on insertion.  The cecum was identified by the appendiceal orifice and the ileocecal valve.  In the fact the scope was inserted showing in the terminal ileum which was normal. Photo documentation was obtained.  A few scattered biopsies of the terminal ileum were obtained.  We went ahead and did the biopsies because of a questionable history of Crohns years ago; although that was never proven. The scope was slowly withdrawn.  The prep was adequate.  There was some liquid stool that required washing and suctioning.  She did have a tortuous colon. Random occasional scattered biopsies of the colon were obtained and put in a second container.  The cecum, ascending, transverse, and majority of the descending was normal.  The sigmoid was tortuous.  There was a rare diverticulitis seen and  there was some wall edema probably due to scope trauma.  Some random biopsies of this area were obtained as well.  Once back in the rectum, the scope was retroflexed and pertinent for some small internal hemorrhoids.  Scope was drained, the evidence withdrawn, and the scope removed.  The patient tolerated the procedure well.  There was no obvious immediate complication.  ENDOSCOPIC DIAGNOSES: 1. Internal/external small hemorrhoids. 2. Left-sided sigmoid rare diverticula, tortuosity, and edema, status post    biopsy. 3. Otherwise within normal limits to the terminal ileum with random biopsies    occasionally throughout.  PLAN: 1. Trial of antispasmodics if okay with her neurologist and she will    confirm its use with Betaserone and her other medicines with them at    [facility]. 2. I will be able to see her back sooner p.r.n.  Will touch base with her in a    week about the biopsies and otherwise follow up in 6-8 weeks, recheck    symptoms, guaiacs, and make sure no further workup plans are needed. DD:  05/11/00 TD:  05/16/00 Job: [id number]

## 2011-05-21 ENCOUNTER — Ambulatory Visit
Admission: RE | Admit: 2011-05-21 | Discharge: 2011-05-21 | Disposition: A | Discharge status: Home / Self Care | Payer: Medicare Other | Source: Ambulatory Visit | Attending: Gastroenterology | Admitting: Gastroenterology

## 2011-05-21 MED ORDER — IOHEXOL 300 MG/ML  SOLN
100.0000 mL | Freq: Once | INTRAMUSCULAR | Status: AC | PRN
  Administered 2011-05-21: 100 mL via INTRAVENOUS

## 2011-05-28 DIAGNOSIS — D490 Neoplasm of unspecified behavior of digestive system: Secondary | ICD-10-CM | POA: Insufficient documentation

## 2011-06-04 ENCOUNTER — Encounter (INDEPENDENT_AMBULATORY_CARE_PROVIDER_SITE_OTHER): Payer: Self-pay | Admitting: General Surgery

## 2011-06-18 ENCOUNTER — Encounter (HOSPITAL_COMMUNITY)
Admission: RE | Admit: 2011-06-18 | Discharge: 2011-06-18 | Disposition: A | Discharge status: Home / Self Care | Payer: Medicare Other | Source: Ambulatory Visit | Attending: General Surgery | Admitting: General Surgery

## 2011-06-18 LAB — CBC
Hemoglobin: 13.7 g/dL (ref 12.0–15.0)
RBC: 4.54 MIL/uL (ref 3.87–5.11)

## 2011-06-18 LAB — COMPREHENSIVE METABOLIC PANEL
ALT: 17 U/L (ref 0–35)
Alkaline Phosphatase: 71 U/L (ref 39–117)
CO2: 32 mEq/L (ref 19–32)
Chloride: 106 mEq/L (ref 96–112)
GFR calc Af Amer: >60 mL/min (ref >60)
GFR calc non Af Amer: >60 mL/min (ref >60)
Glucose, Bld: 77 mg/dL (ref 70–99)
Potassium: 4.1 mEq/L (ref 3.5–5.1)
Sodium: 143 mEq/L (ref 135–145)

## 2011-06-18 LAB — DIFFERENTIAL
Basophils Absolute: 0.1 10*3/uL (ref 0.0–0.1)
Basophils Relative: 2 % — ABNORMAL HIGH (ref 0–1)
Eosinophils Relative: 4 % (ref 0–5)
Lymphocytes Relative: 35 % (ref 12–46)

## 2011-06-22 ENCOUNTER — Ambulatory Visit (HOSPITAL_COMMUNITY)
Admission: RE | Admit: 2011-06-22 | Discharge: 2011-06-22 | Disposition: A | Discharge status: Home / Self Care | Payer: Medicare Other | Source: Ambulatory Visit | Attending: General Surgery | Admitting: General Surgery

## 2011-06-22 ENCOUNTER — Other Ambulatory Visit (INDEPENDENT_AMBULATORY_CARE_PROVIDER_SITE_OTHER): Payer: Self-pay | Admitting: General Surgery

## 2011-06-22 DIAGNOSIS — K219 Gastro-esophageal reflux disease without esophagitis: Secondary | ICD-10-CM | POA: Insufficient documentation

## 2011-06-22 DIAGNOSIS — D126 Benign neoplasm of colon, unspecified: Secondary | ICD-10-CM | POA: Insufficient documentation

## 2011-06-22 DIAGNOSIS — G35 Multiple sclerosis: Secondary | ICD-10-CM | POA: Insufficient documentation

## 2011-06-22 DIAGNOSIS — Z01812 Encounter for preprocedural laboratory examination: Secondary | ICD-10-CM | POA: Insufficient documentation

## 2011-06-22 DIAGNOSIS — J45909 Unspecified asthma, uncomplicated: Secondary | ICD-10-CM | POA: Insufficient documentation

## 2011-06-22 DIAGNOSIS — R1031 Right lower quadrant pain: Secondary | ICD-10-CM | POA: Insufficient documentation

## 2011-06-22 DIAGNOSIS — Z79899 Other long term (current) drug therapy: Secondary | ICD-10-CM | POA: Insufficient documentation

## 2011-06-22 DIAGNOSIS — I059 Rheumatic mitral valve disease, unspecified: Secondary | ICD-10-CM | POA: Insufficient documentation

## 2011-06-22 HISTORY — PX: APPENDECTOMY: SHX54

## 2011-06-23 ENCOUNTER — Telehealth (INDEPENDENT_AMBULATORY_CARE_PROVIDER_SITE_OTHER): Payer: Self-pay | Admitting: General Surgery

## 2011-06-23 NOTE — Telephone Encounter (Signed)
Pt called and needed something for  Nausea// talked with dr Andrey Campanile and he ok zofran 4mg  po q6hr prn nausea called to [facility] [phone]/ [phone] eh

## 2011-06-24 ENCOUNTER — Telehealth (INDEPENDENT_AMBULATORY_CARE_PROVIDER_SITE_OTHER): Payer: Self-pay

## 2011-06-24 NOTE — Telephone Encounter (Signed)
Call completed

## 2011-06-24 NOTE — Telephone Encounter (Addendum)
Pt advised not to drink greasy broths, and hold dairy products. Hx of M/S was having diarrhea before surgery. Take immodium as directed--Dr. Andrey Campanile will be notified. --Per Dr. Andrey Campanile  Take Lomotil very sparingly. Charise Carwin 06/24/2011 11:45AM   Per Dr. Andrey Campanile make sure pt stays hydrated.  Patient aware.   Will call back if problem does not improve.Charise Carwin 06/24/2011 12:00PM

## 2011-06-27 ENCOUNTER — Encounter (INDEPENDENT_AMBULATORY_CARE_PROVIDER_SITE_OTHER): Payer: Self-pay | Admitting: General Surgery

## 2011-06-28 ENCOUNTER — Telehealth (INDEPENDENT_AMBULATORY_CARE_PROVIDER_SITE_OTHER): Payer: Self-pay | Admitting: General Surgery

## 2011-06-28 NOTE — Telephone Encounter (Signed)
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 Per Dr Andrey Campanile, they will discuss her at GI Tumor board and go over path report in more detail when she comes in for her appt on 07/09/11. Patient to call back with any questions or problems prior to this.

## 2011-06-28 NOTE — Telephone Encounter (Signed)
Message copied by Liliana Cline on Mon Jun 28, 2011 12:19 PM ------      Message from: Andrey Campanile, ERIC M      Created: Sun Jun 27, 2011  6:16 PM       Can call pt with results.  Will discuss in more detail at postop visit.  Needs no more treatment, but will discuss at GI tumor board on 7/11

## 2011-07-08 NOTE — Op Note (Signed)
[name], [name]            ACCOUNT NO.:  [id number]  MEDICAL RECORD NO.:  [id number]  LOCATION:  [facility]                         FACILITY:  [facility]  PHYSICIAN:  Mary Sella. Andrey Campanile, MD     DATE OF BIRTH:  March 05, 1951  DATE OF PROCEDURE:  06/22/2011 DATE OF DISCHARGE:                              OPERATIVE REPORT   PREOPERATIVE DIAGNOSIS:  Chronic right lower quadrant abdominal pain.  POSTOPERATIVE DIAGNOSIS:  Chronic right lower quadrant abdominal pain.  PROCEDURE: 1. Diagnostic laparoscopy. 2. Laparoscopic appendectomy.  SURGEON:  Mary Sella. Andrey Campanile, MD  ASSISTANT SURGEON:  None.  ANESTHESIA:  General plus 24 mL of 0.25% Marcaine with epi.  SPECIMEN:  Appendix.  ESTIMATED BLOOD LOSS:  Minimal.  FINDINGS:  She had one large filmy adhesive band in her ascending colon to the right lateral abdominal wall which was lysed.  The body of her appendix was somewhat dilated compared to the tip of the appendix. There was no gross adenopathy within the mesoappendix.  The small bowel appeared normal.  INDICATIONS FOR PROCEDURE:  The patient is a 60 year old Caucasian female with multiple sclerosis who has chronic right-sided abdominal pain.  It has been there for over a year.  It occurs on a daily basis, whereby increases in intensity with certain movements.  She also has alternating bouts of loose stool and solid stools, as well as constipation.  She has had some prior pelvic surgery with respect to hysterectomy and oophorectomy and bladder suspension.  She had a CT scan of her abdomen and pelvis which demonstrated a dilated fluid-filled appendix, possible early mucocele.  We talked about the risk and benefits of surgery versus additional workup such as a colonoscopy.  The patient elected to proceed to surgery which I agreed with.  The appendix had changed in appearance compared to previous imaging.  We talked about the possibility of need for additional surgery should she have  an abnormality on her final pathology report of her appendix.  We discussed the risks and benefits of surgery including but not limited to bleeding, infection, injury to surrounding structures, staple line complications such as bleeding or leak, DVT occurrence, urinary retention, multiple sclerosis flare, umbilical hernia, need for additional procedures, and failure to ameliorate her abdominal pain.  DESCRIPTION OF PROCEDURE:  The patient was given 100 mcg of octreotide subcutaneously prior to going to the operating room.  She has a nephew with history of appendiceal carcinoid although there was no radiological features consistent with carcinoid on her CT.  She did report a history of intermittent diarrhea, but no flushing.  She was then taken to the operating room and placed supine on the operating table.  General endotracheal anesthesia was established.  A Foley catheter was placed. Her abdomen was prepped and draped in usual standard surgical fashion. She received cefoxitin prior to skin incision.  Surgical time-out was performed.  I injected local at the base of her umbilicus.  I then made a curvilinear incision below her umbilicus through an older scar.  The fascia was grasped and lifted anteriorly.  The fascia was incised with #11 blade and the abdominal cavity was entered.  A pursestring suture was placed around the fascial edges  consisting of 0 Vicryl on the UR-6 needle.  The Hasson trocar was placed directly into the abdominal cavity on direct visualization.  Pneumoperitoneum was smoothly established up to a patient pressure of 15 mmHg.  The abdominal cavity was surveilled. There was no evidence of injury to surrounding structures.  I placed a 5- mm trocar in the suprapubic area using her old Pfannenstiel incision, as well as a 5-mm trocar in the left lower quadrant all under direct visualization after local has been infiltrated.  She had a thin filmy adhesion from her  ascending colon to her right lateral abdominal wall, this was taken down with a Harmonic scalpel.  I then visualized her appendix.  The tip was normal, however, the body of appendix appeared a little bit dilated, it was not inflamed but it did appear dilated.  I made a small window in the mesoappendix at the base with a Jola Babinski, then using an articulating laparoscopic 45-mm stapler with a blue load stapler across the base of the appendix taking a small amount of cecum. I then took down the mesoappendix in a serial fashion using the Harmonic scalpel.  The appendix was then placed in the specimen bag and brought out through the Hasson trocar and pneumoperitoneum was reestablished. Then, using atraumatic graspers, I ran the bowel starting at the terminal ileum all the way up to the ligament of Treitz.  There was no evidence of Meckel diverticulum.  There was no evidence of creeping fat. There were no signs of inflammation within the mesentery.  There were no signs of any type of stricture within the lumen of the small bowel.  The gallbladder appeared normal.  There were no signs of distention of the gallbladder.  The liver both the left and right lobes appeared normal without any signs of cirrhosis.  Stomach appeared normal.  Looking in the pelvis, the sigmoid colon and upper rectum appeared normal.  The right ureter and iliac vessels were identified with no signs of scar tissue or adhesions in the right pelvic area.  The left colon appeared normal.  I reinspected the staple line and there was a little bit of ooze from the mesoappendix.  I could not identify a particular bleeder in particular, just had a little bit of ooze, therefore, I placed a piece of surgical snow in the mesoappendix.  I reinspected the small bowel and then after several minutes looked back down in the right lower quadrant and hemostasis had been achieved.  I then removed the Hasson trocar and tied down the previously  placed pursestring suture thus obliterating the fascial defect.  There were no signs of air leak at the umbilicus and the fascia was well approximated.  I injected some additional local in the preperitoneal space.  The remaining trocars were removed.  All skin incisions were closed with 4-0 Monocryl in a subcuticular fashion followed by application of Dermabond.  All needle, instrument, and sponge counts were correct x2.  The Foley catheter was removed.  The patient was awakened and taken to recovery in stable condition.  There were no immediate complications.  The patient tolerated the procedure well.     Mary Sella. Andrey Campanile, MD     EMW/MEDQ  D:  06/22/2011  T:  06/22/2011  Job:  [id number]  cc:   Petra Kuba, M.D. Genene Churn. Love, M.D. Sigmund I. Patsi Sears, M.D.  Electronically Signed by Gaynelle Adu M.D. on 07/08/2011 02:23:39 PM

## 2011-07-09 ENCOUNTER — Ambulatory Visit (INDEPENDENT_AMBULATORY_CARE_PROVIDER_SITE_OTHER): Payer: Medicare Other | Admitting: General Surgery

## 2011-07-09 ENCOUNTER — Encounter (INDEPENDENT_AMBULATORY_CARE_PROVIDER_SITE_OTHER): Payer: Self-pay | Admitting: General Surgery

## 2011-07-09 DIAGNOSIS — D490 Neoplasm of unspecified behavior of digestive system: Secondary | ICD-10-CM

## 2011-07-09 DIAGNOSIS — L02214 Cutaneous abscess of groin: Secondary | ICD-10-CM

## 2011-07-09 DIAGNOSIS — L03319 Cellulitis of trunk, unspecified: Secondary | ICD-10-CM

## 2011-07-09 DIAGNOSIS — C181 Malignant neoplasm of appendix: Secondary | ICD-10-CM

## 2011-07-09 NOTE — Patient Instructions (Signed)
Try adding fiber to your diet.  Abscess/Boil Care After (Furuncle) An abscess (also called a boil or furuncle) is an infected area that contains a collection of pus. Signs and symptoms of an abscess include pain, tenderness, redness, or hardness, or you may feel a moveable soft area under your skin. An abscess can occur anywhere in the body. The infection may spread to surrounding tissues causing cellulitis. A cut (incision) by the surgeon was made over your abscess and the pus was drained out. Gauze may have been packed into the space to provide a drain that will allow the cavity to heal from the inside outwards. The boil may be painful for 5 to 7 days. Most people with a boil do not have high fevers. Your abscess, if seen early, may not have localized, and may not have been lanced. If not, another appointment may be required for this if it does not get better on its own or with medications. HOME CARE INSTRUCTIONS  Only take over-the-counter or prescription medicines for pain, discomfort, or fever as directed by your caregiver.   When you bathe, soak and then remove gauze or iodoform packs at least daily or as directed by your caregiver. You may then wash the wound gently with mild soapy water.place  gauze over wound or do as your caregiver directs.  SEEK IMMEDIATE MEDICAL CARE IF:  You develop increased pain, swelling, redness, drainage, or bleeding in the wound site.   You develop signs of generalized infection including muscle aches, chills, fever, or a general ill feeling.   An oral temperature above 101.5 develops, not controlled by medication.  See your caregiver for a recheck if you develop any of the symptoms described above. If medications (antibiotics) were prescribed, take them as directed. Document Released: 07/01/2005 Document Re-Released: 06/02/2010 [facility] Patient Information 2011 [place], [place].

## 2011-07-12 ENCOUNTER — Encounter (INDEPENDENT_AMBULATORY_CARE_PROVIDER_SITE_OTHER): Payer: Self-pay | Admitting: General Surgery

## 2011-07-12 NOTE — Progress Notes (Addendum)
Procedure: Diagnostic laparoscopy, laparoscopic appendectomy June 22, 2011  History of Present Ilness: 60 year old Caucasian female with history of multiple sclerosis who comes in today for her first postoperative appointment. She is accompanied by her family. We took her to the operating room on June 26 for her chronic right-sided abdominal pain. She was also found to have an abnormal appearing appendix on a preoperative CT scan consistent with a mucocele.  She went home the same day of surgery. She reports some continued intermittent diffuse abdominal pain. She denies any fevers, chills, nausea, or vomiting. She reports that her appetite is still not back to baseline. She also reports semi- formed stool. She denies any problems with her incisions.  She reports about a one-week history of a lump in her right inguinal region. It has been red and swollen. It is tender as well. She denies any drainage or injury to the area.  Physical Exam: There were no vitals taken for this visit.  Well-developed well-nourished Caucasian female in no apparent distress Lungs-clear to auscultation Cardiac-regular rate and rhythm- Abdomen-soft, nontender, nondistended. Well-healed 3 trocar incisions. No signs of incisional hernia. No rebound or guarding. No signs of cellulitis. Right groin shows a 2 cm x 1 cm area of fluctuance in the groin crease. There is no overlying cellulitis. But it is tender on exam.  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 No high-grade dysplasia. Margins are clear. No mucin in the lumen of the appendix  Assessment and Plan: 1-Status post laparoscopic appendectomy and diagnostic laparoscopy for chronic right-sided abdominal pain as well as low grade appendiceal mucinous neoplasm.  I'm hopeful that her bowel habits we'll regulate over the next week or 2. I encouraged her to try to have some fiber to her diet. We discussed foods that were high in fiber as well as  a supplemental fiber such as Benefiber. We also discussed taking some Imodium if needed. I'm also hopeful that her nonspecific abdominal discomfort will improve with time. However it may not and I did mention this to the patient and her family.  I discussed the operative findings. We discussed her pathology report in detail. I explained this is a very rare finding. I do not believe she will need any adjuvant therapy. However I will present her case of the GI tumor board which will make next Wednesday, July 18. I told her her family I would call them after that conference. During the diagnostic laparoscopy I did not appreciate any additional abnormal pathology.  She mentioned that she was informed that Dr. Lenis Noon at [facility] has a study regarding appendiceal tumors and she would like to be referred. I explained that I would like to wait to tumor board meets and if no additional treatment is recommended that I would be happy to refer her to Dr. Lenis Noon for consideration to enroll in the study. In the interim the patient has requested that we faxed her pathology report to Dr. Lenis Noon and I told her that I would be happy to do so.  She was given a copy of her pathology report. I'll see her back in the office in several weeks.  2- RT groin abscess  After obtaining verbal consent, the right groin was prepped with Betadine. 5 cc of 1% Xylocaine was infiltrated over the area of maximal fluctuance. I then made a 1-1/2 inch incision with a #11 blade. There is a little bit of drainage of purulent material. The wound was probed with  a hemostat. The wound was packed with iodoform gauze and covered with dry gauze. The patient tolerated the procedure well. She was given wound care instructions. I advised her to keep the wound packed for the remainder of the day. I told her she can remove the packing in the shower the following morning and just cover the wound with dry gauze. I do not  see a need to place her on antibiotics since there is no cellulitis.  She was instructed to call the office should she develop fever, worsening pain, persistent bleeding, or development of cellulitis.

## 2011-07-29 ENCOUNTER — Telehealth (INDEPENDENT_AMBULATORY_CARE_PROVIDER_SITE_OTHER): Payer: Self-pay | Admitting: General Surgery

## 2011-07-29 NOTE — Telephone Encounter (Signed)
I left message on machine with Britta Mccreedy after calling her back to let her know the patient should be okay to have a colonoscopy now per Dr Andrey Campanile and to call with any questions.

## 2011-08-06 ENCOUNTER — Encounter (INDEPENDENT_AMBULATORY_CARE_PROVIDER_SITE_OTHER): Payer: Medicare Other | Admitting: General Surgery

## 2011-08-19 ENCOUNTER — Ambulatory Visit (INDEPENDENT_AMBULATORY_CARE_PROVIDER_SITE_OTHER): Payer: Medicare Other | Admitting: General Surgery

## 2011-08-19 ENCOUNTER — Encounter (INDEPENDENT_AMBULATORY_CARE_PROVIDER_SITE_OTHER): Payer: Self-pay | Admitting: General Surgery

## 2011-08-19 VITALS — BP 128/86 | HR 60 | Temp 96.8°F

## 2011-08-19 DIAGNOSIS — Z09 Encounter for follow-up examination after completed treatment for conditions other than malignant neoplasm: Secondary | ICD-10-CM

## 2011-08-19 DIAGNOSIS — C181 Malignant neoplasm of appendix: Secondary | ICD-10-CM

## 2011-08-19 DIAGNOSIS — D490 Neoplasm of unspecified behavior of digestive system: Secondary | ICD-10-CM

## 2011-08-19 NOTE — Progress Notes (Signed)
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 I last saw her on July 13. Since she was last seen she states that her right-sided abdominal pain is better. It still occurs on a daily basis however it is not as intense. She will occasionally still get bloated. She denies any nausea or vomiting. She denies any fevers or chills. She denies any weight loss. Her appetite is improving. She was seen at [facility] for a second opinion regarding her surgical pathology and postoperative management. They agreed with our care plan. Unfortunately there is no known tumor marker or surveillance strategy for this type of appendiceal neoplasm. She is scheduled for colonoscopy next week.  Physical Exam: Well-developed well-nourished Caucasian female in no apparent distress Pulmonary-lungs are clear to auscultation Cardiac-regular rate and rhythm Abdomen-soft, nontender, nondistended. Well-healed trocar incisions. no incisional hernia. No masses   Assessment and Plan: Status post diagnostic laparoscopy and laparoscopic appendectomy for low grade appendiceal mucinous neoplasm.  I agree with the colonoscopy. I am not sure of the etiology of her chronic right-sided abdominal discomfort. It may be due to her underlying multiple sclerosis. There appear to be no complications with respect to her appendectomy.  I will see her in 6 months.

## 2011-09-03 ENCOUNTER — Other Ambulatory Visit: Payer: Self-pay | Admitting: Gastroenterology

## 2011-10-07 LAB — POCT HEMOGLOBIN-HEMACUE: Hemoglobin: 14.7

## 2011-12-27 ENCOUNTER — Other Ambulatory Visit: Payer: Self-pay | Admitting: Gastroenterology

## 2011-12-30 ENCOUNTER — Ambulatory Visit
Admission: RE | Admit: 2011-12-30 | Discharge: 2011-12-30 | Disposition: A | Discharge status: Home / Self Care | Payer: Medicare Other | Source: Ambulatory Visit | Attending: Gastroenterology | Admitting: Gastroenterology

## 2011-12-30 MED ORDER — IOHEXOL 300 MG/ML  SOLN
100.0000 mL | Freq: Once | INTRAMUSCULAR | Status: AC | PRN
  Administered 2011-12-30: 100 mL via INTRAVENOUS

## 2012-01-20 ENCOUNTER — Encounter (INDEPENDENT_AMBULATORY_CARE_PROVIDER_SITE_OTHER): Payer: Self-pay | Admitting: General Surgery

## 2012-02-15 ENCOUNTER — Ambulatory Visit: Payer: Medicare Other | Admitting: Physical Therapy

## 2012-03-07 ENCOUNTER — Ambulatory Visit: Payer: Medicare Other | Attending: Orthopedic Surgery | Admitting: Physical Therapy

## 2012-03-07 DIAGNOSIS — R5381 Other malaise: Secondary | ICD-10-CM | POA: Insufficient documentation

## 2012-03-07 DIAGNOSIS — IMO0001 Reserved for inherently not codable concepts without codable children: Secondary | ICD-10-CM | POA: Insufficient documentation

## 2012-03-07 DIAGNOSIS — M545 Low back pain, unspecified: Secondary | ICD-10-CM | POA: Insufficient documentation

## 2012-03-07 DIAGNOSIS — M25559 Pain in unspecified hip: Secondary | ICD-10-CM | POA: Insufficient documentation

## 2012-03-07 DIAGNOSIS — R262 Difficulty in walking, not elsewhere classified: Secondary | ICD-10-CM | POA: Insufficient documentation

## 2012-03-09 ENCOUNTER — Ambulatory Visit: Payer: Medicare Other

## 2012-03-13 ENCOUNTER — Encounter: Payer: Medicare Other | Admitting: Physical Therapy

## 2012-03-14 ENCOUNTER — Ambulatory Visit: Payer: Medicare Other | Admitting: Physical Therapy

## 2012-03-16 ENCOUNTER — Ambulatory Visit (INDEPENDENT_AMBULATORY_CARE_PROVIDER_SITE_OTHER): Payer: BC Managed Care – PPO | Admitting: General Surgery

## 2012-03-16 ENCOUNTER — Encounter (INDEPENDENT_AMBULATORY_CARE_PROVIDER_SITE_OTHER): Payer: Self-pay | Admitting: General Surgery

## 2012-03-16 VITALS — BP 122/77 | HR 56 | Temp 97.8°F | Ht 70.0 in | Wt 156.4 lb

## 2012-03-16 DIAGNOSIS — D490 Neoplasm of unspecified behavior of digestive system: Secondary | ICD-10-CM

## 2012-03-16 NOTE — Progress Notes (Signed)
Chief complaint: Followup  Procedure: Status post laparoscopic appendectomy June 22, 2011  History of Present Ilness: 61 year old Caucasian female comes in for long-term followup. She was found to have a low-grade mucinous neoplasm of her appendix. She has been doing well. She had some macular edema related to her MS. However her vision has improved since she started some eyedrops. She still has some intermittent right lower quadrant abdominal pain. It is overall better. Dr. Ewing Schlein put her on Amitiza which has helped. She has also undergone a colonoscopy since I last saw her. 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 She denies any weight loss. She denies any fevers or chills. She denies any diarrhea. She has some intermittent constipation. She has intermittent abdominal bloating. She denies any nausea or vomiting.  Physical Exam: BP 122/77  Pulse 56  Temp(Src) 97.8 F (36.6 C) (Temporal)  Ht 5\' 10"  (1.778 m)  Wt 156 lb 6.4 oz (70.943 kg)  BMI 22.44 kg/m2  SpO2 95%  Gen: alert, NAD, non-toxic appearing Pupils: equal, no scleral icterus Pulm: Lungs clear to auscultation, symmetric chest rise CV: regular rate and rhythm Abd: soft, nontender, nondistended. Well-healed trocar sites. No cellulitis. 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  overall doing well  She agrees with doing relatively good from a GI standpoint. 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  Followup p.r.n.

## 2012-03-17 ENCOUNTER — Ambulatory Visit: Payer: Medicare Other | Admitting: Physical Therapy

## 2012-03-21 ENCOUNTER — Ambulatory Visit: Payer: Medicare Other | Admitting: Physical Therapy

## 2012-03-23 ENCOUNTER — Ambulatory Visit: Payer: Medicare Other | Admitting: Physical Therapy

## 2012-03-28 ENCOUNTER — Ambulatory Visit: Payer: Medicare Other | Admitting: Physical Therapy

## 2012-03-30 ENCOUNTER — Ambulatory Visit: Payer: Medicare Other | Admitting: Physical Therapy

## 2012-04-04 ENCOUNTER — Ambulatory Visit: Payer: Medicare Other | Attending: Orthopedic Surgery | Admitting: Physical Therapy

## 2012-04-04 DIAGNOSIS — M545 Low back pain, unspecified: Secondary | ICD-10-CM | POA: Insufficient documentation

## 2012-04-04 DIAGNOSIS — M25559 Pain in unspecified hip: Secondary | ICD-10-CM | POA: Insufficient documentation

## 2012-04-04 DIAGNOSIS — R262 Difficulty in walking, not elsewhere classified: Secondary | ICD-10-CM | POA: Insufficient documentation

## 2012-04-04 DIAGNOSIS — R5381 Other malaise: Secondary | ICD-10-CM | POA: Insufficient documentation

## 2012-04-04 DIAGNOSIS — IMO0001 Reserved for inherently not codable concepts without codable children: Secondary | ICD-10-CM | POA: Insufficient documentation

## 2012-04-07 ENCOUNTER — Ambulatory Visit: Payer: Medicare Other | Admitting: Physical Therapy

## 2012-04-14 ENCOUNTER — Ambulatory Visit: Payer: Medicare Other | Admitting: Physical Therapy

## 2012-04-19 ENCOUNTER — Ambulatory Visit: Payer: Medicare Other | Admitting: Physical Therapy

## 2012-04-24 ENCOUNTER — Encounter: Payer: Medicare Other | Admitting: Physical Therapy

## 2012-04-26 ENCOUNTER — Encounter: Payer: Medicare Other | Admitting: Physical Therapy

## 2012-05-02 ENCOUNTER — Encounter: Payer: Medicare Other | Admitting: Physical Therapy

## 2012-05-05 ENCOUNTER — Encounter: Payer: Medicare Other | Admitting: Physical Therapy

## 2012-05-13 ENCOUNTER — Emergency Department (HOSPITAL_COMMUNITY)
Admission: EM | Admit: 2012-05-13 | Discharge: 2012-05-13 | Disposition: A | Discharge status: Home / Self Care | Payer: Medicare Other | Attending: Emergency Medicine | Admitting: Emergency Medicine

## 2012-05-13 ENCOUNTER — Encounter (HOSPITAL_COMMUNITY): Payer: Self-pay | Admitting: *Deleted

## 2012-05-13 DIAGNOSIS — J45909 Unspecified asthma, uncomplicated: Secondary | ICD-10-CM | POA: Insufficient documentation

## 2012-05-13 DIAGNOSIS — R1031 Right lower quadrant pain: Secondary | ICD-10-CM | POA: Insufficient documentation

## 2012-05-13 DIAGNOSIS — K921 Melena: Secondary | ICD-10-CM

## 2012-05-13 DIAGNOSIS — Z8509 Personal history of malignant neoplasm of other digestive organs: Secondary | ICD-10-CM | POA: Insufficient documentation

## 2012-05-13 DIAGNOSIS — Z79899 Other long term (current) drug therapy: Secondary | ICD-10-CM | POA: Insufficient documentation

## 2012-05-13 DIAGNOSIS — G35 Multiple sclerosis: Secondary | ICD-10-CM | POA: Insufficient documentation

## 2012-05-13 DIAGNOSIS — K625 Hemorrhage of anus and rectum: Secondary | ICD-10-CM | POA: Insufficient documentation

## 2012-05-13 LAB — CBC
HCT: 41 % (ref 36.0–46.0)
Hemoglobin: 13.9 g/dL (ref 12.0–15.0)
MCH: 30.8 pg (ref 26.0–34.0)
MCHC: 33.9 g/dL (ref 30.0–36.0)

## 2012-05-13 LAB — BASIC METABOLIC PANEL
BUN: 13 mg/dL (ref 6–23)
Chloride: 105 mEq/L (ref 96–112)
Creatinine, Ser: 0.94 mg/dL (ref 0.50–1.10)
GFR calc Af Amer: 75 mL/min — ABNORMAL LOW (ref >90)

## 2012-05-13 LAB — DIFFERENTIAL
Basophils Relative: 1 % (ref 0–1)
Monocytes Absolute: 0.6 10*3/uL (ref 0.1–1.0)
Monocytes Relative: 13 % — ABNORMAL HIGH (ref 3–12)
Neutro Abs: 2.6 10*3/uL (ref 1.7–7.7)

## 2012-05-13 NOTE — ED Notes (Signed)
Discharge instructions reviewed w pt, verbalizes understanding.

## 2012-05-13 NOTE — ED Notes (Signed)
Pt from home with reports of a large amount of bright red blood in toilet after bowel movement. Pt endorses hx of MS and hemorrhoids but denies constipation or N/V.

## 2012-05-13 NOTE — ED Provider Notes (Addendum)
History     CSN: [id number]  Arrival date & time 05/13/12  1009   First MD Initiated Contact with Patient 05/13/12 1025      Chief Complaint  Patient presents with  . Rectal Bleeding  . Abdominal Pain    RLQ    (Consider location/radiation/quality/duration/timing/severity/associated sxs/prior treatment) Patient is a 61 y.o. female presenting with hematochezia and abdominal pain. The history is provided by the patient and a relative.  Rectal Bleeding  Associated symptoms include abdominal pain. Pertinent negatives include no diarrhea, no nausea and no vomiting.  Abdominal Pain The primary symptoms of the illness include abdominal pain and hematochezia. The primary symptoms of the illness do not include shortness of breath, nausea, vomiting or diarrhea.  Symptoms associated with the illness do not include constipation.  24 y female c/o bright red blood per rectum.  She denies abd pain, constipation, light headedness, sob. She is not on anticoagulants.  She has hx of hemorrhoidectomy.   PCP is Duane Lope.  She has seen Dr. Ewing Schlein as well.  Told she had polyps.     Past Medical History  Diagnosis Date  . Multiple sclerosis   . Asthma   . Incontinence   . Joint pain   . Rectal bleeding   . Diarrhea   . 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 Back pain   . Cancer     Past Surgical History  Procedure Date  . Tonsillectomy   . Vaginal hysterectomy   . Oophorectomy   . Appendectomy 06/22/2011    laparoscopic; Dr Andrey Campanile  . Shoulder surgery   . Spine surgery     c3 and c4 fusion and c4 and c5 fusion  . Bladder suspension   . Bunion removal   . Foot surgery   . Breast surgery     augmentation and removal  . Excisional hemorrhoidectomy 08/05/2010    external; Dr Donell Beers    Family History  Problem Relation Age of Onset  . Liver disease Father   . Stroke Mother   . Cancer Brother     twin; tonsillar?  . Cancer Sister 40    breast ca, lung ca  .  Cancer Other     Nephew; appendiceal carcnoid, melanoma  . Cancer Paternal Grandmother     cervical  . Cancer Mother     oropharyngeal    History  Substance Use Topics  . Smoking status: Never Smoker   . Smokeless tobacco: Never Used  . Alcohol Use: Yes     socially    OB History    Grav Para Term Preterm Abortions TAB SAB Ect Mult Living                  Review of Systems  Respiratory: Negative for shortness of breath.   Cardiovascular: Negative for palpitations.  Gastrointestinal: Positive for abdominal pain, blood in stool and hematochezia. Negative for nausea, vomiting, diarrhea and constipation.  Neurological: Negative for dizziness and light-headedness.  Hematological: Does not bruise/bleed easily.  All other systems reviewed and are negative.    Allergies  Codeine; Gilenya; Latex; Morphine and related; Sumatriptan; and Tysabri  Home Medications   Current Outpatient Rx  Name Route Sig Dispense Refill  . ALBUTEROL SULFATE (2.5 MG/3ML) 0.083% IN NEBU Nebulization Take 2.5 mg by nebulization every 6 (six) hours as needed.      . BUDESONIDE-FORMOTEROL FUMARATE 160-4.5 MCG/ACT IN AERO Inhalation Inhale 2 puffs into the  lungs 2 (two) times daily.      Marland Kitchen CALCIUM 600 + D PO Oral Take by mouth 3 (three) times daily.      Marland Kitchen LORATADINE 10 MG PO TABS Oral Take 10 mg by mouth daily.      Marland Kitchen MECLIZINE HCL 25 MG PO TABS Oral Take 25 mg by mouth 3 (three) times daily as needed. Half tablet      . ONE-DAILY MULTI VITAMINS PO TABS Oral Take 1 tablet by mouth daily.      Marland Kitchen NATALIZUMAB 300 MG/15ML IV CONC Intravenous Inject into the vein every 30 (thirty) days.      Marland Kitchen NEPAFENAC 0.1 % OP SUSP Both Eyes Place 3 drops into both eyes 3 (three) times daily.    Marland Kitchen OMEPRAZOLE 40 MG PO CPDR Oral Take 40 mg by mouth daily.      Marland Kitchen PREDNISOLONE ACETATE 1 % OP SUSP Both Eyes Place 1 drop into both eyes 4 (four) times daily.    Marland Kitchen PREGABALIN 50 MG PO CAPS Oral Take 50 mg by mouth 3 (three) times  daily. At bedtime     . INHALER COMPANIONS MISC  Pro air inhaler 8.5 grams prn      . SUMATRIPTAN-NAPROXEN SODIUM 85-500 MG PO TABS Oral Take 1 tablet by mouth every 2 (two) hours as needed.      Marland Kitchen FORTEO [place] Subcutaneous Inject into the skin daily.        BP 118/67  Pulse 90  Temp(Src) 98.2 F (36.8 C) (Oral)  Resp 20  Ht 5\' 10"  (1.778 m)  Wt 150 lb (68.04 kg)  BMI 21.52 kg/m2  SpO2 99%  Physical Exam  Vitals reviewed. Constitutional: She is oriented to person, place, and time. She appears well-developed and well-nourished.  HENT:  Head: Normocephalic and atraumatic.  Eyes: Conjunctivae are normal.  Neck: Normal range of motion. Neck supple.  Pulmonary/Chest: Effort normal. No respiratory distress.  Abdominal: Soft. She exhibits no distension. There is no tenderness. There is no rebound and no guarding.  Genitourinary: Guaiac positive stool.       Rectal done with RN chaperone.  + Bright red blood.  Musculoskeletal: Normal range of motion. She exhibits no edema.  Neurological: She is alert and oriented to person, place, and time.  Skin: Skin is warm and dry.  Psychiatric: She has a normal mood and affect. Thought content normal.    ED Course  Procedures (including critical care time) hematechezia in 75 y female.  No sxs of orthostasis.  Will check labs.     Labs Reviewed  OCCULT BLOOD, POC DEVICE  CBC  DIFFERENTIAL  BASIC METABOLIC PANEL   No results found.   No diagnosis found.  12:14 PM Spoke with dr. Ewing Schlein.  He did not rec any further tests.  He knows her well.  He said can d/c pt. She should f/u in office next week. She should call for appt.   reeval'd pt.  She remains asx.  Explained findings and plan. She understands and agrees.    MDM  hematechezia No acute abdomen.  No anemia. No hypotension.         Cheri Guppy, MD 05/13/12 1112  Cheri Guppy, MD 05/13/12 [phone]

## 2012-05-13 NOTE — Discharge Instructions (Signed)
Are not anemic from your bleeding.  You do not have hemorrhoids.  Today.  Followup with Dr. Ewing Schlein next week.  Call for appointment time.  Return for uncontrolled pain, lightheadedness, shortness of breath, or any other concerns

## 2012-07-11 ENCOUNTER — Ambulatory Visit
Admission: RE | Admit: 2012-07-11 | Discharge: 2012-07-11 | Disposition: A | Discharge status: Home / Self Care | Payer: Medicare Other | Source: Ambulatory Visit | Attending: Gastroenterology | Admitting: Gastroenterology

## 2012-07-11 ENCOUNTER — Other Ambulatory Visit: Payer: Self-pay | Admitting: Gastroenterology

## 2012-07-11 DIAGNOSIS — R109 Unspecified abdominal pain: Secondary | ICD-10-CM

## 2012-07-11 DIAGNOSIS — K219 Gastro-esophageal reflux disease without esophagitis: Secondary | ICD-10-CM

## 2012-07-27 ENCOUNTER — Other Ambulatory Visit: Payer: Self-pay | Admitting: Gastroenterology

## 2012-08-02 ENCOUNTER — Other Ambulatory Visit: Payer: Self-pay | Admitting: Gastroenterology

## 2012-08-04 ENCOUNTER — Encounter (INDEPENDENT_AMBULATORY_CARE_PROVIDER_SITE_OTHER): Payer: Self-pay | Admitting: Surgery

## 2012-08-04 ENCOUNTER — Ambulatory Visit (INDEPENDENT_AMBULATORY_CARE_PROVIDER_SITE_OTHER): Payer: Medicare Other | Admitting: Surgery

## 2012-08-04 VITALS — BP 114/68 | HR 66 | Temp 97.3°F | Resp 14 | Ht 70.0 in | Wt 145.5 lb

## 2012-08-04 DIAGNOSIS — N6459 Other signs and symptoms in breast: Secondary | ICD-10-CM

## 2012-08-04 DIAGNOSIS — N6452 Nipple discharge: Secondary | ICD-10-CM

## 2012-08-04 NOTE — Patient Instructions (Signed)
Will send for ductogram.  Return after.

## 2012-08-04 NOTE — Progress Notes (Signed)
Patient ID: [name], female   DOB: Dec 17, 1951, 61 y.o.   MRN: [id number]  Chief Complaint  Patient presents with  . New Evaluation    Nipple lesion    HPI [name] is a 61 y.o. female.   HPIPatient's had the request of Dr. Billy Coast due to discharge the left nipple. It has been present for one to 2 weeks. Discharge is yellow in appearance. Patient denies any blood in the discharge. A smaller area crusty change the tip of the left nipple. There is mild tenderness. She has no lump. There's been no redness or or warmth.  Past Medical History  Diagnosis Date  . Multiple sclerosis   . Asthma   . Incontinence   . Joint pain   . Rectal bleeding   . Diarrhea   . 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 Back pain   . Cancer   . Emphysema of lung     non smoker  . Osteoporosis   . Neuromuscular disorder   . Rectal bleeding   . Visual disturbance   . Weakness   . Nipple lesion     Past Surgical History  Procedure Date  . Tonsillectomy   . Oophorectomy   . Appendectomy 06/22/2011    laparoscopic; Dr Andrey Campanile  . Shoulder surgery   . Bladder suspension   . Bunion removal   . Foot surgery   . Excisional hemorrhoidectomy 08/05/2010    external; Dr Donell Beers  . Vaginal hysterectomy   . Spine surgery     c3 and c4 fusion and c4 and c5 fusion  . Breast surgery     augmentation and removal    Family History  Problem Relation Age of Onset  . Liver disease Father   . Cirrhosis Father   . Stroke Mother   . Cancer Mother     oropharyngeal  . Heart attack Mother   . Cancer Brother     twin; tonsillar?  . Cancer Sister 80    breast ca, lung ca  . Cancer Other     Nephew; appendiceal carcnoid, melanoma  . Cancer Paternal Grandmother     cervical    Social History History  Substance Use Topics  . Smoking status: Never Smoker   . Smokeless tobacco: Never Used  . Alcohol Use: Yes     socially    Allergies  Allergen Reactions  .  Gilenya (Fingolimod Hydrochloride) Anaphylaxis    MACULAR EDEMA  . Betaseron (Interferon Beta-1b) Other (See Comments)    REACTION : HARDEN TISSUE AT THE INJECTION SITE, REQUIRED REMOVAL   . Codeine Nausea And Vomiting  . Morphine And Related Other (See Comments)    REACTION: VEINS BECAME RED STREAKED  . Tysabri (Natalizumab) Other (See Comments)    REACTiON: DEVELOPED ANTIBODY  . Latex Rash  . Sumatriptan Hives, Swelling and Palpitations    THIS REACTION IS ONLY WHEN USING THE INJECTION.  TABLET FORM CAUSES NO PROBLEMS    Current Outpatient Prescriptions  Medication Sig Dispense Refill  . albuterol (PROAIR HFA) 108 (90 BASE) MCG/ACT inhaler Inhale 2 puffs into the lungs as needed.      . Budesonide (UCERIS) 9 MG TB24 Take by mouth daily.      . budesonide-formoterol (SYMBICORT) 160-4.5 MCG/ACT inhaler Inhale 2 puffs into the lungs 2 (two) times daily.        . Calcium Carbonate-Vit D-Min (CALTRATE 600+D PLUS) 600-400 MG-UNIT per tablet Take  3 tablets by mouth every morning.       Marland Kitchen dexlansoprazole (DEXILANT) 60 MG capsule Take 60 mg by mouth 2 (two) times daily.      . meclizine (ANTIVERT) 25 MG tablet Take 25 mg by mouth 3 (three) times daily as needed. FOR MOTION SICKNESS       . meloxicam (MOBIC) 7.5 MG tablet Take 7.5 mg by mouth 2 (two) times daily.      . Multiple Vitamins-Minerals (MULTIVITAMIN PO) Take by mouth. Brand name - Mature Multivitamin from [employer]      . pregabalin (LYRICA) 50 MG capsule Take 150 mg by mouth at bedtime. At bedtime       . Teriparatide, Recombinant, (FORTEO [place]) Inject into the skin at bedtime.       Marland Kitchen DISCONTD: estrogens, conjugated, (PREMARIN) 0.3 MG tablet Take 0.3 mg by mouth every other day. Take daily for 21 days then do not take for 7 days.        Review of Systems Review of Systems  Constitutional: Negative.   HENT: Negative.   Eyes: Negative.   Respiratory: Negative.   Cardiovascular: Negative.   Gastrointestinal: Negative.     Genitourinary: Negative.   Musculoskeletal: Negative.   Neurological: Negative.   Hematological: Negative.   Psychiatric/Behavioral: Negative.     Blood pressure 114/68, pulse 66, temperature 97.3 F (36.3 C), temperature source Temporal, resp. rate 14, height 5\' 10"  (1.778 m), weight 145 lb 8 oz (65.998 kg).  Physical Exam Physical Exam  Constitutional: She is oriented to person, place, and time. She appears well-developed and well-nourished.  HENT:  Head: Normocephalic and atraumatic.  Eyes: EOM are normal. Pupils are equal, round, and reactive to light.  Neck: Normal range of motion. Neck supple.  Pulmonary/Chest: Effort normal and breath sounds normal. Left breast exhibits nipple discharge.    Musculoskeletal: Normal range of motion.  Neurological: She is alert and oriented to person, place, and time.  Skin: Skin is warm and dry.  Psychiatric: She has a normal mood and affect. Her behavior is normal. Judgment and thought content normal.      Assessment    Left breast nipple discharge. Family history of breast cancer    Plan    Ductogram left breast.  Obtain mammogram from [facility].  Return after.  If ductogram unrevealing or unable to do,  Punch biopsy of nipple.       Kacen Mellinger A. 08/04/2012, 10:08 AM

## 2012-08-08 ENCOUNTER — Telehealth (INDEPENDENT_AMBULATORY_CARE_PROVIDER_SITE_OTHER): Payer: Self-pay | Admitting: General Surgery

## 2012-08-08 NOTE — Telephone Encounter (Signed)
[facility] at [facility]

## 2012-08-08 NOTE — Telephone Encounter (Signed)
Called [facility]- confirmed patient scheduled 08/09/12 for ductogram. Janett Billow and made her aware this is a [facility] patient.

## 2012-08-08 NOTE — Telephone Encounter (Signed)
[name] with [facility] called re: ductogram order. Patient has not had any imaging at their location. I don't see where a mammogram is in the system. Has patient had a mammogram somewhere else or do we need to order mammogram? Please advise.

## 2012-08-18 ENCOUNTER — Encounter (INDEPENDENT_AMBULATORY_CARE_PROVIDER_SITE_OTHER): Payer: Self-pay | Admitting: Surgery

## 2012-08-18 ENCOUNTER — Ambulatory Visit (INDEPENDENT_AMBULATORY_CARE_PROVIDER_SITE_OTHER): Payer: Medicare Other | Admitting: Surgery

## 2012-08-18 VITALS — BP 114/76 | HR 76 | Resp 14 | Ht 70.0 in | Wt 147.2 lb

## 2012-08-18 DIAGNOSIS — N6459 Other signs and symptoms in breast: Secondary | ICD-10-CM

## 2012-08-18 DIAGNOSIS — N6452 Nipple discharge: Secondary | ICD-10-CM

## 2012-08-18 NOTE — Progress Notes (Signed)
Subjective:     Patient ID: [name], female   DOB: 12-Aug-1951, 61 y.o.   MRN: [id number]  HPI Patient returns after attempted ductogram for left breast nipple discharge. The ductogram could not be performed. Ultrasound was done and no suspicious abnormality is located under left nipple. A small skin tag fell off in the left nipple.new more significant drainage from the left breast noted.  Review of Systems  Constitutional: Negative.   HENT: Negative.        Objective:   Physical Exam  Pulmonary/Chest:         Assessment:     Left breast nipple drainage resolved.  Negative U/S .    Plan:     Return in 6 months for recheck give family history of breast cancer

## 2012-08-18 NOTE — Patient Instructions (Signed)
Return 6 months.  Call if drainage increases or becomes bloody.

## 2012-08-23 ENCOUNTER — Encounter (INDEPENDENT_AMBULATORY_CARE_PROVIDER_SITE_OTHER): Payer: Medicare Other | Admitting: General Surgery

## 2012-09-11 ENCOUNTER — Encounter (INDEPENDENT_AMBULATORY_CARE_PROVIDER_SITE_OTHER): Payer: Self-pay

## 2012-11-01 ENCOUNTER — Other Ambulatory Visit: Payer: Self-pay | Admitting: Dermatology

## 2013-01-23 ENCOUNTER — Other Ambulatory Visit: Payer: Self-pay | Admitting: Dermatology

## 2013-02-21 ENCOUNTER — Ambulatory Visit: Payer: Medicare Other | Attending: Neurology | Admitting: Physical Therapy

## 2013-02-21 DIAGNOSIS — IMO0001 Reserved for inherently not codable concepts without codable children: Secondary | ICD-10-CM | POA: Insufficient documentation

## 2013-02-21 DIAGNOSIS — R5381 Other malaise: Secondary | ICD-10-CM | POA: Insufficient documentation

## 2013-02-21 DIAGNOSIS — M2550 Pain in unspecified joint: Secondary | ICD-10-CM | POA: Insufficient documentation

## 2013-02-21 DIAGNOSIS — G35 Multiple sclerosis: Secondary | ICD-10-CM | POA: Insufficient documentation

## 2013-02-21 DIAGNOSIS — Z9181 History of falling: Secondary | ICD-10-CM | POA: Insufficient documentation

## 2013-02-22 ENCOUNTER — Ambulatory Visit: Payer: Medicare Other | Admitting: Physical Therapy

## 2013-02-23 ENCOUNTER — Ambulatory Visit: Payer: Medicare Other | Admitting: Physical Therapy

## 2013-02-26 ENCOUNTER — Ambulatory Visit: Payer: Medicare Other | Attending: Neurology | Admitting: Physical Therapy

## 2013-02-26 DIAGNOSIS — R5381 Other malaise: Secondary | ICD-10-CM | POA: Insufficient documentation

## 2013-02-26 DIAGNOSIS — G35 Multiple sclerosis: Secondary | ICD-10-CM | POA: Insufficient documentation

## 2013-02-26 DIAGNOSIS — IMO0001 Reserved for inherently not codable concepts without codable children: Secondary | ICD-10-CM | POA: Insufficient documentation

## 2013-02-26 DIAGNOSIS — Z9181 History of falling: Secondary | ICD-10-CM | POA: Insufficient documentation

## 2013-02-26 DIAGNOSIS — M810 Age-related osteoporosis without current pathological fracture: Secondary | ICD-10-CM | POA: Insufficient documentation

## 2013-03-02 ENCOUNTER — Ambulatory Visit: Payer: Medicare Other | Admitting: Physical Therapy

## 2013-03-05 ENCOUNTER — Ambulatory Visit: Payer: Medicare Other | Admitting: Physical Therapy

## 2013-03-08 ENCOUNTER — Ambulatory Visit: Payer: Medicare Other | Admitting: Physical Therapy

## 2013-03-12 ENCOUNTER — Encounter: Payer: Medicare Other | Admitting: Physical Therapy

## 2013-03-15 ENCOUNTER — Encounter: Payer: Medicare Other | Admitting: Physical Therapy

## 2013-03-19 ENCOUNTER — Ambulatory Visit: Payer: Medicare Other | Admitting: Physical Therapy

## 2013-03-20 ENCOUNTER — Other Ambulatory Visit: Payer: Self-pay

## 2013-03-20 DIAGNOSIS — G35 Multiple sclerosis: Secondary | ICD-10-CM

## 2013-03-21 LAB — HEPATIC FUNCTION PANEL
ALT: 21 IU/L (ref 0–32)
AST: 19 IU/L (ref 0–40)
Albumin: 4.3 g/dL (ref 3.6–4.8)
Alkaline Phosphatase: 79 IU/L (ref 39–117)

## 2013-03-22 ENCOUNTER — Ambulatory Visit: Payer: Medicare Other | Admitting: Physical Therapy

## 2013-03-26 ENCOUNTER — Ambulatory Visit: Payer: Medicare Other | Admitting: Physical Therapy

## 2013-03-29 ENCOUNTER — Ambulatory Visit: Payer: Medicare Other | Attending: Neurology | Admitting: Physical Therapy

## 2013-03-29 DIAGNOSIS — IMO0001 Reserved for inherently not codable concepts without codable children: Secondary | ICD-10-CM | POA: Insufficient documentation

## 2013-03-29 DIAGNOSIS — Z9181 History of falling: Secondary | ICD-10-CM | POA: Insufficient documentation

## 2013-03-29 DIAGNOSIS — G35 Multiple sclerosis: Secondary | ICD-10-CM | POA: Insufficient documentation

## 2013-03-29 DIAGNOSIS — M810 Age-related osteoporosis without current pathological fracture: Secondary | ICD-10-CM | POA: Insufficient documentation

## 2013-03-29 DIAGNOSIS — R5381 Other malaise: Secondary | ICD-10-CM | POA: Insufficient documentation

## 2013-04-02 ENCOUNTER — Ambulatory Visit (INDEPENDENT_AMBULATORY_CARE_PROVIDER_SITE_OTHER): Payer: Medicare Other | Admitting: Diagnostic Neuroimaging

## 2013-04-02 ENCOUNTER — Ambulatory Visit: Payer: Medicare Other | Admitting: Physical Therapy

## 2013-04-02 ENCOUNTER — Encounter: Payer: Self-pay | Admitting: Diagnostic Neuroimaging

## 2013-04-02 VITALS — BP 121/70 | HR 80 | Ht 70.0 in | Wt 145.0 lb

## 2013-04-02 DIAGNOSIS — G35 Multiple sclerosis: Secondary | ICD-10-CM

## 2013-04-02 NOTE — Progress Notes (Signed)
[facility]  PATIENT: [name] DOB: [birth year]  REFERRING CLINICIAN:  HISTORY FROM: patient REASON FOR VISIT: follow up   HISTORICAL  CHIEF COMPLAINT:  Chief Complaint  Patient presents with  . Multiple Sclerosis    HISTORY OF PRESENT ILLNESS:   UPDATE 04/02/13: since last visit, patient continues to have gait difficulty and is working with physical therapy. She's tolerating aubagio better now (hair loss stabilized). Over past 5 weeks she has had some increasing numbness in her right hand and right leg. She continues to use a cane for ambulation. I reviewed her initial symptoms, diagnosis and treatment as well. Patient tells me that her first symptoms and diagnosis was in 1988 with spinal fluid analysis. She tells me she was started on Betaseron in 1993 with in a clinical trial. Over the years she was tried on Avonex, Copaxone, Tysabri, Gilenya, Tecfidera. Interestingly her MRI studies have never demonstrated abnormal T2 hyperintensities.  PRIOR HPI (11/13/12, Dr. Sandria Manly): "62 year old right-handed white married female with bilateral lower and upper extremity weakness, normal MRI studies of the brain and cervical spine, the last 10/02/2010, and abnormal CSF with positive oligoclonal bands in 1995. She has a history of Betaseron therapy for suspected multiple sclerosis and was changed to Avonex in 2008 because of injection site sores. On Avonex she did not do well and was changed to Copaxone but did not tolerate that medication  She has recurrent falls with surgery to her right foot 06/2009 and an injury from a fall 11/10 when she "cracked her pelvis in 2 places and her left hip".  She has osteoporosis and is being followed by Dr. Charlett Blake. She  changed from Reclast  to [facility]. She has urinary incontinence followed by Dr. Patsi Sears and colitis symptoms followed by Dr. Ewing Schlein. She had been on 6 mercaptopurine. This was discontinued for 6 months and then she was started on  Tysabri. She  had 12 doses of Tysabri. Prior to beginning Tysabri she was evaluated by Dr. Leotis Shames regarding the use of that medication. With the lack of positive information except CSF oligoclonal IgG. It was then decided to give her a 6 month trial. She denied side effects from the medication except recurrent  vaginitis. She has  cramps in her right lower  leg and in her feet and hands at night. She has not improved in her neurologic symptoms. She became JC virus anybody positive and had been on 6 mercaptopurine giving her a  risk factor of 1-500  for PML. She discontinued Tysabri 02/2011 and started [facility] 06/2011. She was seen by Dr. Randon Goldsmith 01/26/2012 with evidence of macular edema left eye greater than right.Gilenya was discontinued.  I received a telephone call from Dr. Randon Goldsmith 5/31 indicating that her left eye vision never totally improved. Her visual fields were worse in her left eye. She complained of pain in moving the left eye. He was concerned about retrobulbar neuritis.   He obtained visual acuity 20/25 right eye and 20/50 in the left. I saw her 05/29/2012 with visual acuity of 20/70 right eye 20/200 and the left. There was no Marcus Gunn pupil. Visual evoked responses were normal. She started Tecfidera 05/2012 and developed GI symptoms. Tecfidera has been discontinued She has severe bladder and bowel incontinence followed with Dr. Patsi Sears which had improved. She has  back pain radiating to buttock and left leg. She has seen Dr. Newell Coral with lumbar MRI at L4-5 and L5-S1 and evidence of degenerative disc disease She has pain in  her left buttock which goes into her left leg. she notes increasing problems picking up her left leg. She has been on Aubagio for 2 months.  She has intermittent right sided  headache pain, lying down suggestive of right occipital neuralgia. This comes and goes"  REVIEW OF SYSTEMS: Full 14 system review of systems performed and notable only for fatigue eye pain incontinence  trouble swallowing cramps feeling hot feeling cold easy bruising numbness weakness difficulty swallowing.  ALLERGIES: Allergies  Allergen Reactions  . Gilenya (Fingolimod Hydrochloride) Anaphylaxis    MACULAR EDEMA  . Betaseron (Interferon Beta-1b) Other (See Comments)    REACTION : HARDEN TISSUE AT THE INJECTION SITE, REQUIRED REMOVAL   . Codeine Nausea And Vomiting  . Morphine And Related Other (See Comments)    REACTION: VEINS BECAME RED STREAKED  . Tysabri (Natalizumab) Other (See Comments)    DEVELOPED JCV ANTIBODY  . Latex Rash  . Sumatriptan Hives, Swelling and Palpitations    THIS REACTION IS ONLY WHEN USING THE INJECTION.  TABLET FORM CAUSES NO PROBLEMS    HOME MEDICATIONS: Outpatient Prescriptions Prior to Visit  Medication Sig Dispense Refill  . albuterol (PROAIR HFA) 108 (90 BASE) MCG/ACT inhaler Inhale 2 puffs into the lungs as needed.      . Budesonide (UCERIS) 9 MG TB24 Take by mouth daily.      . budesonide-formoterol (SYMBICORT) 160-4.5 MCG/ACT inhaler Inhale 2 puffs into the lungs 2 (two) times daily.        . Calcium Carbonate-Vit D-Min (CALTRATE 600+D PLUS) 600-400 MG-UNIT per tablet Take 3 tablets by mouth every morning.       Marland Kitchen dexlansoprazole (DEXILANT) 60 MG capsule Take 60 mg by mouth 2 (two) times daily.      . Multiple Vitamins-Minerals (MULTIVITAMIN PO) Take by mouth. Brand name - Mature Multivitamin from [employer]      . pregabalin (LYRICA) 50 MG capsule Take 150 mg by mouth at bedtime. At bedtime       . Teriparatide, Recombinant, (FORTEO [place]) Inject into the skin at bedtime.       . meclizine (ANTIVERT) 25 MG tablet Take 25 mg by mouth 3 (three) times daily as needed. FOR MOTION SICKNESS       . meloxicam (MOBIC) 7.5 MG tablet Take 7.5 mg by mouth 2 (two) times daily.       No facility-administered medications prior to visit.    PAST MEDICAL HISTORY: Past Medical History  Diagnosis Date  . Multiple sclerosis   . Asthma   . Incontinence   .  Joint pain   . Rectal bleeding   . Diarrhea   . 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 Back pain   . Cancer   . Emphysema of lung     non smoker  . Osteoporosis   . Neuromuscular disorder   . Rectal bleeding   . Visual disturbance   . Weakness   . Nipple lesion   . Multiple sclerosis     PAST SURGICAL HISTORY: Past Surgical History  Procedure Laterality Date  . Tonsillectomy    . Oophorectomy    . Appendectomy  06/22/2011    laparoscopic; Dr Andrey Campanile  . Shoulder surgery    . Bladder suspension    . Bunion removal    . Foot surgery    . Excisional hemorrhoidectomy  08/05/2010    external; Dr Donell Beers  . Vaginal hysterectomy    . Spine surgery  c3 and c4 fusion and c4 and c5 fusion  . Breast surgery      augmentation and removal    FAMILY HISTORY: Family History  Problem Relation Age of Onset  . Liver disease Father   . Cirrhosis Father   . Stroke Mother   . Cancer Mother     oropharyngeal  . Heart attack Mother   . Cancer Brother     twin; tonsillar?  . Cancer Sister 64    breast ca, lung ca  . Cancer Other     Nephew; appendiceal carcnoid, melanoma  . Cancer Paternal Grandmother     cervical    SOCIAL HISTORY:  History   Social History  . Marital Status: Married    Spouse Name: N/A    Number of Children: 2  . Years of Education: college   Occupational History  . Not on file.   Social History Main Topics  . Smoking status: Never Smoker   . Smokeless tobacco: Never Used  . Alcohol Use: Yes     Comment: socially  . Drug Use: No  . Sexually Active: Not on file   Other Topics Concern  . Not on file   Social History Narrative   Pt lives at home with her spouse of 37 years.   Caffeine Use- Drinks caffeine occasionally.     PHYSICAL EXAM  Filed Vitals:   04/02/13 1326  BP: 121/70  Pulse: 80  Height: 5\' 10"  (1.778 m)  Weight: 145 lb (65.772 kg)   Body mass index is 20.81 kg/(m^2).  GENERAL EXAM: Patient  is in no distress  CARDIOVASCULAR: Regular rate and rhythm, no murmurs, no carotid bruits  NEUROLOGIC: MENTAL STATUS: awake, alert, language fluent, comprehension intact, naming intact CRANIAL NERVE: no papilledema on fundoscopic exam, pupils equal and reactive to light, visual fields full to confrontation, extraocular muscles intact, no nystagmus, facial sensation and strength symmetric, uvula midline, shoulder shrug symmetric, tongue midline. MOTOR: POSTURAL TREMOR WITH "SEE-SAW" OF BUE WITH EYES CLOSED AND ARMS EXTENDED. Normal bulk and tone. SIGNIFICANT GIVE WAY WEAKNESS IN BUE AND BLE. DIFFICULT TO TELL IF THIS IS EFFORT DEPENDED OR NOT. SENSORY: DECR IN BUE AND RLE.  COORDINATION: ATAXIA IN BUE FTN. REFLEXES: TRACE IN ARMS, ABSENT IN BLE. GAIT/STATION: ATAXIC GAIT WITH PELVIC TILTING. USES SINGLE POINT CANE.   DIAGNOSTIC DATA (LABS, IMAGING, TESTING) - I reviewed patient records, labs, notes, testing and imaging myself where available.  Lab Results  Component Value Date   WBC 4.9 05/13/2012   HGB 13.9 05/13/2012   HCT 41.0 05/13/2012   MCV 90.9 05/13/2012   PLT 209 05/13/2012      Component Value Date/Time   NA 141 05/13/2012 1050   K 3.6 05/13/2012 1050   CL 105 05/13/2012 1050   CO2 25 05/13/2012 1050   GLUCOSE 120* 05/13/2012 1050   BUN 13 05/13/2012 1050   CREATININE 0.94 05/13/2012 1050   CALCIUM 9.3 05/13/2012 1050   PROT 6.3 03/20/2013 0933   PROT 6.1 06/18/2011 0932   ALBUMIN 3.8 06/18/2011 0932   AST 19 03/20/2013 0933   ALT 21 03/20/2013 0933   ALKPHOS 79 03/20/2013 0933   BILITOT 0.3 03/20/2013 0933   GFRNONAA 65* 05/13/2012 1050   GFRAA 75* 05/13/2012 1050   No results found for this basename: CHOL, HDL, LDLCALC, LDLDIRECT, TRIG, CHOLHDL   No results found for this basename: HGBA1C   No results found for this basename: VITAMINB12   No results found for this  basename: TSH    ASSESSMENT AND PLAN  62 y.o. year old female  has a past medical history of Multiple  sclerosis; Asthma; Incontinence; Joint pain; Rectal bleeding; Diarrhea; Neoplasm of appendix (June 2012); Back pain; Cancer; Emphysema of lung; Osteoporosis; Neuromuscular disorder; Rectal bleeding; Visual disturbance; Weakness; Nipple lesion; and Multiple sclerosis. here with followup evaluation of MS. In the past she has been diagnosed on the basis of "positive oligoclonal bands" from the late 1980s. However MRI studies thus far have been unremarkable.  I will check MRI brain and cervical spine, to evaluate for progression as well as the recent onset of right-sided numbness symptoms. She also has diffusely decreased reflexes throughout, and I will check EMG nerve conduction study for further evaluation. For now she should continue on her current medication and I will renew physical therapy.   Orders Placed This Encounter  Procedures  . MR Brain W Wo Contrast  . MR Cervical Spine W Wo Contrast  . NCV with EMG(electromyography)    Suanne Marker, MD 04/02/2013, 1:38 PM Certified in Neurology, Neurophysiology and Neuroimaging  [facility] [address] [place], [place] [address] [phone]

## 2013-04-02 NOTE — Patient Instructions (Addendum)
I will check MRI brain and cervical spine, as well as EMG studies. I will renew physical therapy. Continue current medications.

## 2013-04-04 ENCOUNTER — Ambulatory Visit: Payer: Medicare Other | Admitting: Physical Therapy

## 2013-04-09 ENCOUNTER — Encounter (INDEPENDENT_AMBULATORY_CARE_PROVIDER_SITE_OTHER): Payer: Medicare Other

## 2013-04-09 ENCOUNTER — Ambulatory Visit: Payer: Medicare Other | Admitting: Physical Therapy

## 2013-04-09 ENCOUNTER — Ambulatory Visit (INDEPENDENT_AMBULATORY_CARE_PROVIDER_SITE_OTHER): Payer: Medicare Other | Admitting: Diagnostic Neuroimaging

## 2013-04-09 DIAGNOSIS — R200 Anesthesia of skin: Secondary | ICD-10-CM

## 2013-04-09 DIAGNOSIS — Z0289 Encounter for other administrative examinations: Secondary | ICD-10-CM

## 2013-04-09 DIAGNOSIS — R209 Unspecified disturbances of skin sensation: Secondary | ICD-10-CM

## 2013-04-09 DIAGNOSIS — G35 Multiple sclerosis: Secondary | ICD-10-CM

## 2013-04-09 NOTE — Procedures (Signed)
   [facility]  NCS (NERVE CONDUCTION STUDY) WITH EMG (ELECTROMYOGRAPHY) REPORT   STUDY DATE: 04/09/13 PATIENT NAME: [name] DOB: 1951-08-17 MRN: [id number]  ORDERING CLINICIAN: Joycelyn Schmid, MD   TECHNOLOGIST: Gearldine Shown ELECTROMYOGRAPHER: Glenford Bayley. Shatara Stanek, MD  CLINICAL INFORMATION: 62 year old female with bilateral upper and left lower extremity numbness.  FINDINGS: NERVE CONDUCTION STUDY: Bilateral median, ulnar, peroneal and tibial motor responses have normal distal latencies, amplitudes, conduction velocities and F-wave latencies. Bilateral median, ulnar, superficial peroneal sensory responses are normal. Right H reflex responses with stimulation of the tibial nerves and recording over the soleus muscle is normal.  NEEDLE ELECTROMYOGRAPHY: Needle examination of selected muscles of the left upper extremity (deltoid, biceps, triceps, flexor carpi radialis, first dorsal interosseous) shows occasional complex repetitive discharges in the left first dorsal interosseous muscle but otherwise normal. There is intermittent motor unit recruitment on exertion.  IMPRESSION:  Normal study. No electrodiagnostic evidence of large fiber neuropathy at this time.   INTERPRETING PHYSICIAN:  Suanne Marker, MD Certified in Neurology, Neurophysiology and Neuroimaging  [facility] [address] [place], [place] [address] [phone]

## 2013-04-10 ENCOUNTER — Other Ambulatory Visit: Payer: Medicare Other

## 2013-04-10 ENCOUNTER — Ambulatory Visit
Admission: RE | Admit: 2013-04-10 | Discharge: 2013-04-10 | Disposition: A | Discharge status: Home / Self Care | Payer: Medicare Other | Source: Ambulatory Visit | Attending: Diagnostic Neuroimaging | Admitting: Diagnostic Neuroimaging

## 2013-04-10 DIAGNOSIS — G35 Multiple sclerosis: Secondary | ICD-10-CM

## 2013-04-10 MED ORDER — GADOBENATE DIMEGLUMINE 529 MG/ML IV SOLN
13.0000 mL | Freq: Once | INTRAVENOUS | Status: AC | PRN
  Administered 2013-04-10: 13 mL via INTRAVENOUS

## 2013-04-11 ENCOUNTER — Ambulatory Visit: Payer: Medicare Other | Admitting: Physical Therapy

## 2013-04-12 ENCOUNTER — Other Ambulatory Visit: Payer: Self-pay | Admitting: Diagnostic Neuroimaging

## 2013-04-12 ENCOUNTER — Encounter: Payer: Medicare Other | Admitting: Physical Therapy

## 2013-04-12 DIAGNOSIS — G35 Multiple sclerosis: Secondary | ICD-10-CM

## 2013-04-16 ENCOUNTER — Ambulatory Visit: Payer: Medicare Other | Admitting: Physical Therapy

## 2013-04-18 ENCOUNTER — Ambulatory Visit: Payer: Medicare Other | Admitting: Physical Therapy

## 2013-04-20 ENCOUNTER — Ambulatory Visit: Payer: Medicare Other | Admitting: Physical Therapy

## 2013-04-23 ENCOUNTER — Ambulatory Visit: Payer: Medicare Other | Admitting: Physical Therapy

## 2013-04-25 ENCOUNTER — Telehealth: Payer: Self-pay | Admitting: Diagnostic Neuroimaging

## 2013-04-25 ENCOUNTER — Ambulatory Visit: Payer: Medicare Other | Admitting: Physical Therapy

## 2013-04-25 NOTE — Telephone Encounter (Signed)
Pt called and is asking about her MRI results.    I called her and gave her the results.   No change of cervical MRI from 10-02-10.  Faxed to Dr. Pati Gallo (reports) since pt is having L arm/neck pain.  She saw Dr. Farris Has yesterday.  I relayed he may want to look at CD ([facility]).

## 2013-04-30 ENCOUNTER — Ambulatory Visit: Payer: Medicare Other | Attending: Neurology | Admitting: Physical Therapy

## 2013-04-30 DIAGNOSIS — M25519 Pain in unspecified shoulder: Secondary | ICD-10-CM | POA: Insufficient documentation

## 2013-04-30 DIAGNOSIS — M549 Dorsalgia, unspecified: Secondary | ICD-10-CM | POA: Insufficient documentation

## 2013-04-30 DIAGNOSIS — IMO0001 Reserved for inherently not codable concepts without codable children: Secondary | ICD-10-CM | POA: Insufficient documentation

## 2013-04-30 DIAGNOSIS — R5381 Other malaise: Secondary | ICD-10-CM | POA: Insufficient documentation

## 2013-04-30 DIAGNOSIS — R209 Unspecified disturbances of skin sensation: Secondary | ICD-10-CM | POA: Insufficient documentation

## 2013-05-02 ENCOUNTER — Ambulatory Visit: Payer: Medicare Other | Attending: Sports Medicine | Admitting: Physical Therapy

## 2013-05-02 ENCOUNTER — Ambulatory Visit: Payer: Medicare Other | Admitting: Physical Therapy

## 2013-05-02 DIAGNOSIS — IMO0001 Reserved for inherently not codable concepts without codable children: Secondary | ICD-10-CM | POA: Insufficient documentation

## 2013-05-02 DIAGNOSIS — M25529 Pain in unspecified elbow: Secondary | ICD-10-CM | POA: Insufficient documentation

## 2013-05-02 DIAGNOSIS — R5381 Other malaise: Secondary | ICD-10-CM | POA: Insufficient documentation

## 2013-05-02 DIAGNOSIS — R209 Unspecified disturbances of skin sensation: Secondary | ICD-10-CM | POA: Insufficient documentation

## 2013-05-02 DIAGNOSIS — M549 Dorsalgia, unspecified: Secondary | ICD-10-CM | POA: Insufficient documentation

## 2013-05-07 ENCOUNTER — Ambulatory Visit: Payer: Medicare Other | Admitting: Physical Therapy

## 2013-05-09 ENCOUNTER — Ambulatory Visit: Payer: Medicare Other | Admitting: Physical Therapy

## 2013-05-14 ENCOUNTER — Ambulatory Visit: Payer: Medicare Other | Admitting: Physical Therapy

## 2013-05-16 ENCOUNTER — Encounter: Payer: Medicare Other | Admitting: Physical Therapy

## 2013-05-16 ENCOUNTER — Ambulatory Visit: Payer: Medicare Other | Admitting: Physical Therapy

## 2013-05-22 ENCOUNTER — Ambulatory Visit: Payer: Medicare Other | Admitting: Physical Therapy

## 2013-05-24 ENCOUNTER — Ambulatory Visit: Payer: Medicare Other | Admitting: Physical Therapy

## 2013-05-28 ENCOUNTER — Ambulatory Visit: Payer: Medicare Other | Attending: Sports Medicine | Admitting: Physical Therapy

## 2013-05-28 DIAGNOSIS — M549 Dorsalgia, unspecified: Secondary | ICD-10-CM | POA: Insufficient documentation

## 2013-05-28 DIAGNOSIS — M25529 Pain in unspecified elbow: Secondary | ICD-10-CM | POA: Insufficient documentation

## 2013-05-28 DIAGNOSIS — IMO0001 Reserved for inherently not codable concepts without codable children: Secondary | ICD-10-CM | POA: Insufficient documentation

## 2013-05-28 DIAGNOSIS — R5381 Other malaise: Secondary | ICD-10-CM | POA: Insufficient documentation

## 2013-05-28 DIAGNOSIS — R209 Unspecified disturbances of skin sensation: Secondary | ICD-10-CM | POA: Insufficient documentation

## 2013-05-30 ENCOUNTER — Ambulatory Visit: Payer: Medicare Other | Attending: Neurology | Admitting: Physical Therapy

## 2013-05-30 DIAGNOSIS — M25539 Pain in unspecified wrist: Secondary | ICD-10-CM | POA: Insufficient documentation

## 2013-05-30 DIAGNOSIS — M542 Cervicalgia: Secondary | ICD-10-CM | POA: Insufficient documentation

## 2013-05-30 DIAGNOSIS — M25519 Pain in unspecified shoulder: Secondary | ICD-10-CM | POA: Insufficient documentation

## 2013-05-30 DIAGNOSIS — IMO0001 Reserved for inherently not codable concepts without codable children: Secondary | ICD-10-CM | POA: Insufficient documentation

## 2013-05-31 ENCOUNTER — Ambulatory Visit: Payer: Medicare Other | Admitting: Physical Therapy

## 2013-06-04 ENCOUNTER — Ambulatory Visit: Payer: Medicare Other | Admitting: Physical Therapy

## 2013-06-05 ENCOUNTER — Ambulatory Visit: Payer: Medicare Other | Admitting: Physical Therapy

## 2013-09-03 ENCOUNTER — Telehealth: Payer: Self-pay

## 2013-09-03 NOTE — Telephone Encounter (Signed)
[facility] called, left message saying they have tried to contact this patient multiple times regarding co-pay assistance for Aubagio, but have not been able to reach her.  They need her to return the application if she would still to be considered for assisatnce.  She asked that we try to reach out to the patient and see if we are able to get in touch with her.  I called the patient.  She said they have been out of the country, and just returned last week.  Says her husband has gotten all of the info together that the program has requested and they will contact the program/mail them the info needed.

## 2013-09-12 ENCOUNTER — Telehealth: Payer: Self-pay | Admitting: Neurology

## 2013-09-12 NOTE — Telephone Encounter (Signed)
I Spoke with [facility].  They will try to contact the patient again today.

## 2013-09-17 ENCOUNTER — Other Ambulatory Visit: Payer: Self-pay | Admitting: Family Medicine

## 2013-09-17 DIAGNOSIS — R634 Abnormal weight loss: Secondary | ICD-10-CM

## 2013-09-24 ENCOUNTER — Ambulatory Visit
Admission: RE | Admit: 2013-09-24 | Discharge: 2013-09-24 | Disposition: A | Discharge status: Home / Self Care | Payer: Medicare Other | Source: Ambulatory Visit | Attending: Family Medicine | Admitting: Family Medicine

## 2013-09-24 DIAGNOSIS — R918 Other nonspecific abnormal finding of lung field: Secondary | ICD-10-CM | POA: Insufficient documentation

## 2013-09-24 DIAGNOSIS — R634 Abnormal weight loss: Secondary | ICD-10-CM

## 2013-09-24 MED ORDER — IOHEXOL 300 MG/ML  SOLN
100.0000 mL | Freq: Once | INTRAMUSCULAR | Status: AC | PRN
  Administered 2013-09-24: 100 mL via INTRAVENOUS

## 2013-09-30 ENCOUNTER — Other Ambulatory Visit: Payer: Self-pay

## 2013-09-30 MED ORDER — TERIFLUNOMIDE 7 MG PO TABS: 1.0000 | ORAL_TABLET | Freq: Every day | ORAL | Status: DC

## 2013-10-15 ENCOUNTER — Telehealth: Payer: Self-pay | Admitting: Diagnostic Neuroimaging

## 2013-10-17 ENCOUNTER — Telehealth (INDEPENDENT_AMBULATORY_CARE_PROVIDER_SITE_OTHER): Payer: Self-pay

## 2013-10-17 DIAGNOSIS — N6452 Nipple discharge: Secondary | ICD-10-CM

## 2013-10-17 NOTE — Telephone Encounter (Signed)
Called patient regarding 6 month follow up. She doesn't recall receiving a phone call to schedule appt. Does not think she has had recent mammogram. Advised I will put an order in for her to have one at [facility] and we will call her with the appt and a f/u appt with Dr Luisa Hart.

## 2013-10-18 ENCOUNTER — Telehealth (INDEPENDENT_AMBULATORY_CARE_PROVIDER_SITE_OTHER): Payer: Self-pay | Admitting: *Deleted

## 2013-10-18 NOTE — Telephone Encounter (Signed)
I spoke with pt to inform her of her appt for MM at [facility] located on [address] on 10/19/13 at 10:15am.  I also informed pt of her f/u appt with Dr. Luisa Hart on 11/26/13 with an arrival time of 10:25am.  Pt agreeable.

## 2013-10-25 ENCOUNTER — Other Ambulatory Visit: Payer: Self-pay | Admitting: Dermatology

## 2013-10-29 ENCOUNTER — Encounter (INDEPENDENT_AMBULATORY_CARE_PROVIDER_SITE_OTHER): Payer: Self-pay

## 2013-10-30 NOTE — Telephone Encounter (Signed)
I called pt and after speaking to her.  Last seen 03/2013 and having sx after restarting aubagio.  Did not have these sx when on aubagio before,  increased tingling/nerve feeling thighs down, joints sore knuckles and fingers R > L.  Made appt with Dr. Marjory Lies 11-02-13 at 1030, wanted to see MD.  Will address labs, after restarting on aubagio one month ago.

## 2013-10-30 NOTE — Telephone Encounter (Signed)
Message copied by Hermenia Fiscal on Tue Oct 30, 2013 12:51 PM ------      Message from: Seth Bake      Created: Mon Oct 29, 2013  4:26 PM       Calling again.  Called Oct. 20 and did not get a call back.  Please call [phone] ------

## 2013-10-31 ENCOUNTER — Encounter (INDEPENDENT_AMBULATORY_CARE_PROVIDER_SITE_OTHER): Payer: Self-pay

## 2013-11-02 ENCOUNTER — Ambulatory Visit (INDEPENDENT_AMBULATORY_CARE_PROVIDER_SITE_OTHER): Payer: Medicare Other | Admitting: Diagnostic Neuroimaging

## 2013-11-02 ENCOUNTER — Encounter (INDEPENDENT_AMBULATORY_CARE_PROVIDER_SITE_OTHER): Payer: Self-pay

## 2013-11-02 ENCOUNTER — Encounter: Payer: Self-pay | Admitting: Diagnostic Neuroimaging

## 2013-11-02 VITALS — BP 143/73 | HR 79 | Temp 98.2°F | Ht 70.0 in | Wt 136.0 lb

## 2013-11-02 DIAGNOSIS — R209 Unspecified disturbances of skin sensation: Secondary | ICD-10-CM

## 2013-11-02 DIAGNOSIS — G35 Multiple sclerosis: Secondary | ICD-10-CM

## 2013-11-02 DIAGNOSIS — R200 Anesthesia of skin: Secondary | ICD-10-CM

## 2013-11-02 MED ORDER — GABAPENTIN 300 MG PO CAPS: 300.0000 mg | ORAL_CAPSULE | Freq: Three times a day (TID) | ORAL | Status: DC

## 2013-11-02 NOTE — Progress Notes (Signed)
[facility]  PATIENT: [name] DOB: 07-25-1951  REFERRING CLINICIAN:  HISTORY FROM: patient REASON FOR VISIT: follow up   HISTORICAL  CHIEF COMPLAINT:  Chief Complaint  Patient presents with  . Follow-up    MS    HISTORY OF PRESENT ILLNESS:   UPDATE 11/02/13: Since last visit patient had to stopaubagio do to loss of insurance. She was off of medication from June to September 2014. She is restarted the medication has been on for past 21 days. She's doing well otherwise. Continues to have intermittent numbness and tingling in her hands and arms. She feels weakness in her arms and right leg. She continues to have fatigue memory loss numbness weakness difficulty swallowing tremor weight loss.  UPDATE 04/02/13: since last visit, patient continues to have gait difficulty and is working with physical therapy. She's tolerating aubagio better now (hair loss stabilized). Over past 5 weeks she has had some increasing numbness in her right hand and right leg. She continues to use a cane for ambulation. I reviewed her initial symptoms, diagnosis and treatment as well. Patient tells me that her first symptoms and diagnosis was in 1988 with spinal fluid analysis. She tells me she was started on Betaseron in 1993 with in a clinical trial. Over the years she was tried on Avonex, Copaxone, Tysabri, Gilenya, Tecfidera. Interestingly her MRI studies have never demonstrated abnormal T2 hyperintensities.  PRIOR HPI (11/13/12, Dr. Sandria Manly): "62 year old right-handed white married female with bilateral lower and upper extremity weakness, normal MRI studies of the brain and cervical spine, the last 10/02/2010, and abnormal CSF with positive oligoclonal bands in 1995. She has a history of Betaseron therapy for suspected multiple sclerosis and was changed to Avonex in 2008 because of injection site sores. On Avonex she did not do well and was changed to Copaxone but did not tolerate that  medication  She has recurrent falls with surgery to her right foot 06/2009 and an injury from a fall 11/10 when she "cracked her pelvis in 2 places and her left hip".  She has osteoporosis and is being followed by Dr. Charlett Blake. She changed from Reclast to [facility]. She has urinary incontinence followed by Dr. Patsi Sears and colitis symptoms followed by Dr. Ewing Schlein. She had been on 6 mercaptopurine. This was discontinued for 6 months and then she was started on Tysabri. She  had 12 doses of Tysabri. Prior to beginning Tysabri she was evaluated by Dr. Leotis Shames regarding the use of that medication. With the lack of positive information except CSF oligoclonal IgG. It was then decided to give her a 6 month trial. She denied side effects from the medication except recurrent  vaginitis. She has  cramps in her right lower  leg and in her feet and hands at night. She has not improved in her neurologic symptoms. She became JC virus anybody positive and had been on 6 mercaptopurine giving her a  risk factor of 1-500  for PML. She discontinued Tysabri 02/2011 and started Gilenya 06/2011. She was seen by Dr. Randon Goldsmith 01/26/2012 with evidence of macular edema left eye greater than right. Gilenya was discontinued.  I received a telephone call from Dr. Randon Goldsmith 5/31 indicating that her left eye vision never totally improved. Her visual fields were worse in her left eye. She complained of pain in moving the left eye. He was concerned about retrobulbar neuritis.   He obtained visual acuity 20/25 right eye and 20/50 in the left. I saw her 05/29/2012 with visual acuity  of 20/70 right eye 20/200 and the left. There was no Marcus Gunn pupil. Visual evoked responses were normal. She started Tecfidera 05/2012 and developed GI symptoms. Tecfidera has been discontinued She has severe bladder and bowel incontinence followed with Dr. Patsi Sears which had improved. She has  back pain radiating to buttock and left leg. She has seen Dr. Newell Coral with lumbar MRI at  L4-5 and L5-S1 and evidence of degenerative disc disease She has pain in her left buttock which goes into her left leg. she notes increasing problems picking up her left leg. She has been on Aubagio for 2 months. She has intermittent right sided headache pain, lying down suggestive of right occipital neuralgia. This comes and goes"  REVIEW OF SYSTEMS: Full 14 system review of systems performed and notable only for weight loss fatigue swelling in legs trouble swallowing incontinence urination problems joint swelling tremor difficulty swallowing weakness numbness memory loss.   ALLERGIES: Allergies  Allergen Reactions  . Gilenya [Fingolimod Hydrochloride] Anaphylaxis    MACULAR EDEMA  . Betaseron [Interferon Beta-1b] Other (See Comments)    REACTION : HARDEN TISSUE AT THE INJECTION SITE, REQUIRED REMOVAL   . Codeine Nausea And Vomiting  . Morphine And Related Other (See Comments)    REACTION: VEINS BECAME RED STREAKED  . Tysabri [Natalizumab] Other (See Comments)    DEVELOPED JCV ANTIBODY  . Latex Rash  . Sumatriptan Hives, Swelling and Palpitations    THIS REACTION IS ONLY WHEN USING THE INJECTION.  TABLET FORM CAUSES NO PROBLEMS    HOME MEDICATIONS: Outpatient Prescriptions Prior to Visit  Medication Sig Dispense Refill  . albuterol (PROAIR HFA) 108 (90 BASE) MCG/ACT inhaler Inhale 2 puffs into the lungs as needed.      . budesonide-formoterol (SYMBICORT) 160-4.5 MCG/ACT inhaler Inhale 2 puffs into the lungs 2 (two) times daily.        . Calcium Carbonate-Vit D-Min (CALTRATE 600+D PLUS) 600-400 MG-UNIT per tablet Take 3 tablets by mouth every morning.       Marland Kitchen dexlansoprazole (DEXILANT) 60 MG capsule Take 60 mg by mouth 2 (two) times daily.      . meclizine (ANTIVERT) 25 MG tablet Take 25 mg by mouth 3 (three) times daily as needed. FOR MOTION SICKNESS       . Multiple Vitamins-Minerals (MULTIVITAMIN PO) Take by mouth. Brand name - Mature Multivitamin from [employer]      . Budesonide  (UCERIS) 9 MG TB24 Take by mouth daily.      . meloxicam (MOBIC) 7.5 MG tablet Take 7.5 mg by mouth 2 (two) times daily.      . pregabalin (LYRICA) 50 MG capsule Take 150 mg by mouth at bedtime. At bedtime       . Teriflunomide 7 MG TABS Take 1 tablet by mouth daily.  30 tablet  6  . Teriparatide, Recombinant, (FORTEO Turley) Inject into the skin at bedtime.        No facility-administered medications prior to visit.    PAST MEDICAL HISTORY: Past Medical History  Diagnosis Date  . Multiple sclerosis   . Asthma   . Incontinence   . Joint pain   . Rectal bleeding   . Diarrhea   . 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 Back pain   . Cancer   . Emphysema of lung     non smoker  . Osteoporosis   . Neuromuscular disorder   . Rectal bleeding   .  Visual disturbance   . Weakness   . Nipple lesion   . Multiple sclerosis     PAST SURGICAL HISTORY: Past Surgical History  Procedure Laterality Date  . Tonsillectomy    . Oophorectomy    . Appendectomy  06/22/2011    laparoscopic; Dr Andrey Campanile  . Shoulder surgery    . Bladder suspension    . Bunion removal    . Foot surgery    . Excisional hemorrhoidectomy  08/05/2010    external; Dr Donell Beers  . Vaginal hysterectomy    . Spine surgery      c3 and c4 fusion and c4 and c5 fusion  . Breast surgery      augmentation and removal    FAMILY HISTORY: Family History  Problem Relation Age of Onset  . Liver disease Father   . Cirrhosis Father   . Stroke Mother   . Cancer Mother     oropharyngeal  . Heart attack Mother   . Cancer Brother     twin; tonsillar?  . Cancer Sister 76    breast ca, lung ca  . Cancer Other     Nephew; appendiceal carcnoid, melanoma  . Cancer Paternal Grandmother     cervical    SOCIAL HISTORY:  History   Social History  . Marital Status: Married    Spouse Name: Tom    Number of Children: 2  . Years of Education: college   Occupational History  . Retired    Social  History Main Topics  . Smoking status: Never Smoker   . Smokeless tobacco: Never Used  . Alcohol Use: Yes     Comment: socially  . Drug Use: No  . Sexual Activity: Not on file   Other Topics Concern  . Not on file   Social History Narrative   Pt lives at home with her spouse of 37 years.   Caffeine Use- Drinks caffeine occasionally.     PHYSICAL EXAM  Filed Vitals:   11/02/13 1038  BP: 143/73  Pulse: 79  Temp: 98.2 F (36.8 C)  TempSrc: Oral  Height: 5\' 10"  (1.778 m)  Weight: 136 lb (61.689 kg)   Body mass index is 19.51 kg/(m^2).  GENERAL EXAM: Patient is in no distress  CARDIOVASCULAR: Regular rate and rhythm, no murmurs, no carotid bruits  NEUROLOGIC: MENTAL STATUS: awake, alert, language fluent, comprehension intact, naming intact CRANIAL NERVE: no papilledema on fundoscopic exam, pupils equal and reactive to light, visual fields full to confrontation, extraocular muscles intact, no nystagmus, facial sensation and strength symmetric, uvula midline, shoulder shrug symmetric, tongue midline. MOTOR: POSTURAL TREMOR WITH "SEE-SAW" OF BUE WITH EYES CLOSED AND ARMS EXTENDED. Normal bulk and tone. MODERATE GIVE WAY WEAKNESS IN BUE AND BLE.  SENSORY: DECR IN BUE AND RLE.  COORDINATION: ATAXIA IN BUE FTN. REFLEXES: TRACE IN ARMS, ABSENT IN BLE. GAIT/STATION: ATAXIC GAIT WITH PELVIC TILTING. USES SINGLE POINT CANE.   DIAGNOSTIC DATA (LABS, IMAGING, TESTING) - I reviewed patient records, labs, notes, testing and imaging myself where available.  Lab Results  Component Value Date   WBC 4.9 05/13/2012   HGB 13.9 05/13/2012   HCT 41.0 05/13/2012   MCV 90.9 05/13/2012   PLT 209 05/13/2012      Component Value Date/Time   NA 141 05/13/2012 1050   K 3.6 05/13/2012 1050   CL 105 05/13/2012 1050   CO2 25 05/13/2012 1050   GLUCOSE 120* 05/13/2012 1050   BUN 13 05/13/2012 1050   CREATININE 0.94  05/13/2012 1050   CALCIUM 9.3 05/13/2012 1050   PROT 6.3 03/20/2013 0933   PROT 6.1  06/18/2011 0932   ALBUMIN 3.8 06/18/2011 0932   AST 19 03/20/2013 0933   ALT 21 03/20/2013 0933   ALKPHOS 79 03/20/2013 0933   BILITOT 0.3 03/20/2013 0933   GFRNONAA 65* 05/13/2012 1050   GFRAA 75* 05/13/2012 1050   No results found for this basename: CHOL,  HDL,  LDLCALC,  LDLDIRECT,  TRIG,  CHOLHDL   No results found for this basename: HGBA1C   No results found for this basename: VITAMINB12   No results found for this basename: TSH    ASSESSMENT AND PLAN  62 y.o. year old female  has a past medical history of Multiple sclerosis; Asthma; Incontinence; Joint pain; Rectal bleeding; Diarrhea; Neoplasm of appendix (June 2012); Back pain; Cancer; Emphysema of lung; Osteoporosis; Neuromuscular disorder; Rectal bleeding; Visual disturbance; Weakness; Nipple lesion; and Multiple sclerosis. here with followup evaluation of MS. In the past she has been diagnosed on the basis of "positive oligoclonal bands" from the late 1980s. However MRI brain and cervical spine show no evidence of demyelinating plaques.   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 Based on her description this localizes to the thoracic spine. I will check MRI thoracic spine to see if we can confirm any evidence of chronic transverse myelitis/gliosis. Regarding patient's upper extremity symptoms, this may be related to patient's history of cervical spondylosis, disc herniation, status post decompression and fusion. Patient's more diffuse symptoms of fatigue and memory loss, variable, fluctuating neurologic exam with astasia abasia gait and seesaw movements of the upper extremities, may represent a nonorganic etiology.  Ddx: cervical radiculopathy/myelopathy, transverse myelitis, multiple sclerosis   PLAN: 1. MRI thoracic spine and repeat lumbar puncture for definitive diagnosis; if oligoclonal bands are negative and MRI of the  thoracic spine is unremarkable, then I will suggest patient stop Aubagio. Otherwise we will continue patient on medication and observe her over time. 2. Gabapentin for pain   Orders Placed This Encounter  Procedures  . MR Thoracic Spine W Wo Contrast  . DG FLUORO GUIDE LUMBAR PUNCTURE   Return in about 3 months (around 02/02/2014).     Suanne Marker, MD 11/02/2013, 11:46 AM Certified in Neurology, Neurophysiology and Neuroimaging  [facility] [address], [address] [place], [place] [address] [phone]

## 2013-11-02 NOTE — Patient Instructions (Signed)
Continue aubagio.  Start gabapentin 300mg  at bedtime.  I will check MRI thoracic spine and lumbar puncture.

## 2013-11-08 ENCOUNTER — Ambulatory Visit
Admission: RE | Admit: 2013-11-08 | Discharge: 2013-11-08 | Disposition: A | Discharge status: Home / Self Care | Payer: Medicare Other | Source: Ambulatory Visit | Attending: Diagnostic Neuroimaging | Admitting: Diagnostic Neuroimaging

## 2013-11-08 VITALS — BP 112/55 | HR 71

## 2013-11-08 DIAGNOSIS — G35 Multiple sclerosis: Secondary | ICD-10-CM

## 2013-11-08 DIAGNOSIS — J310 Chronic rhinitis: Secondary | ICD-10-CM

## 2013-11-08 DIAGNOSIS — D490 Neoplasm of unspecified behavior of digestive system: Secondary | ICD-10-CM

## 2013-11-08 DIAGNOSIS — R05 Cough: Secondary | ICD-10-CM

## 2013-11-08 DIAGNOSIS — R1312 Dysphagia, oropharyngeal phase: Secondary | ICD-10-CM

## 2013-11-08 DIAGNOSIS — G35D Multiple sclerosis, unspecified: Secondary | ICD-10-CM

## 2013-11-08 DIAGNOSIS — J984 Other disorders of lung: Secondary | ICD-10-CM

## 2013-11-08 DIAGNOSIS — R059 Cough, unspecified: Secondary | ICD-10-CM

## 2013-11-08 DIAGNOSIS — J441 Chronic obstructive pulmonary disease with (acute) exacerbation: Secondary | ICD-10-CM

## 2013-11-08 DIAGNOSIS — N6452 Nipple discharge: Secondary | ICD-10-CM

## 2013-11-08 DIAGNOSIS — R0602 Shortness of breath: Secondary | ICD-10-CM

## 2013-11-08 DIAGNOSIS — J383 Other diseases of vocal cords: Secondary | ICD-10-CM

## 2013-11-08 DIAGNOSIS — K219 Gastro-esophageal reflux disease without esophagitis: Secondary | ICD-10-CM

## 2013-11-08 DIAGNOSIS — G43909 Migraine, unspecified, not intractable, without status migrainosus: Secondary | ICD-10-CM

## 2013-11-08 DIAGNOSIS — R200 Anesthesia of skin: Secondary | ICD-10-CM

## 2013-11-08 LAB — CSF CELL COUNT WITH DIFFERENTIAL: WBC, CSF: 3 cu mm (ref 0–5)

## 2013-11-08 LAB — GLUCOSE, CSF: Glucose, CSF: 59 mg/dL (ref 43–76)

## 2013-11-08 NOTE — Progress Notes (Signed)
One tiger-topped tube of blood drawn for labs for LP; from left [facility] space without difficulty.  DD

## 2013-11-08 NOTE — Progress Notes (Signed)
Pt doing well post LP. Husband at bedside.

## 2013-11-09 ENCOUNTER — Encounter: Payer: Self-pay | Admitting: Diagnostic Radiology

## 2013-11-11 LAB — CSF CULTURE W GRAM STAIN
Gram Stain: NONE SEEN
Gram Stain: NONE SEEN

## 2013-11-12 ENCOUNTER — Other Ambulatory Visit: Payer: Self-pay | Admitting: Diagnostic Neuroimaging

## 2013-11-12 ENCOUNTER — Telehealth: Payer: Self-pay | Admitting: Diagnostic Neuroimaging

## 2013-11-12 DIAGNOSIS — G971 Other reaction to spinal and lumbar puncture: Secondary | ICD-10-CM

## 2013-11-12 LAB — CNS IGG SYNTHESIS RATE, CSF+BLOOD
Albumin, Serum(Neph): 4.5 g/dL (ref 3.2–4.6)
IgG Index, CSF: 0.47 (ref <0.66)
MS CNS IgG Synthesis Rate: -2.5 mg/24 h (ref <=3.3)

## 2013-11-12 MED ORDER — BUTALBITAL-APAP-CAFFEINE 50-325-40 MG PO TABS: 1.0000 | ORAL_TABLET | Freq: Three times a day (TID) | ORAL | Status: DC | PRN

## 2013-11-12 NOTE — Telephone Encounter (Signed)
Pt's prescription was faxed over to [facility] at [phone].

## 2013-11-12 NOTE — Telephone Encounter (Signed)
I called patient and she is still having significant orthostatic headache, s/p LP on 11/08/13. Worse with standing up and better when laying down. She had nausea vomiting over the weekend.  I asked patient to stay well-hydrated, use caffeine, and I called in Fioricet #[phone]. I will also order epidural blood patch for tomorrow afternoon. If patient is feeling better in the morning and she may call to cancel/postpone blood patch. Otherwise she'll proceed.  Suanne Marker, MD 11/12/2013, 3:51 PM Certified in Neurology, Neurophysiology and Neuroimaging  [facility] [address] [place], [place] [address] [phone]

## 2013-11-12 NOTE — Telephone Encounter (Signed)
Pt called at 07:35 this am to ask for a blood patch as she has had headache, nausea and vomiting all weekend. She spoke with Dr. Karin Golden over the weekend and he asked her to call us this morning.  Dr. Richrd Humbles office called at 8:05 am to ask for blood patch order.

## 2013-11-13 ENCOUNTER — Other Ambulatory Visit: Payer: Self-pay | Admitting: Diagnostic Neuroimaging

## 2013-11-13 ENCOUNTER — Inpatient Hospital Stay
Admission: RE | Admit: 2013-11-13 | Discharge: 2013-11-13 | Disposition: A | Discharge status: Home / Self Care | Payer: Medicare Other | Source: Ambulatory Visit

## 2013-11-13 ENCOUNTER — Ambulatory Visit
Admission: RE | Admit: 2013-11-13 | Discharge: 2013-11-13 | Disposition: A | Discharge status: Home / Self Care | Payer: Medicare Other | Source: Ambulatory Visit | Attending: Diagnostic Neuroimaging | Admitting: Diagnostic Neuroimaging

## 2013-11-13 DIAGNOSIS — G971 Other reaction to spinal and lumbar puncture: Secondary | ICD-10-CM

## 2013-11-13 MED ORDER — IOHEXOL 180 MG/ML  SOLN
1.0000 mL | Freq: Once | INTRAMUSCULAR | Status: AC | PRN
  Administered 2013-11-13: 1 mL via EPIDURAL

## 2013-11-13 NOTE — Progress Notes (Signed)
Discharge instructions explained for blood patch. At 8:16 am blood drawn from left [facility], site is unremarkable, and pt tolerated procedure well. 20 cc of blood obtained.

## 2013-11-14 NOTE — Telephone Encounter (Signed)
This encounter was created in error - please disregard.

## 2013-11-15 ENCOUNTER — Inpatient Hospital Stay: Admission: RE | Admit: 2013-11-15 | Payer: Medicare Other | Source: Ambulatory Visit

## 2013-11-26 ENCOUNTER — Ambulatory Visit (INDEPENDENT_AMBULATORY_CARE_PROVIDER_SITE_OTHER): Payer: Self-pay | Admitting: Surgery

## 2013-11-26 ENCOUNTER — Ambulatory Visit
Admission: RE | Admit: 2013-11-26 | Discharge: 2013-11-26 | Disposition: A | Discharge status: Home / Self Care | Payer: Medicare Other | Source: Ambulatory Visit | Attending: Diagnostic Neuroimaging | Admitting: Diagnostic Neuroimaging

## 2013-11-26 DIAGNOSIS — R200 Anesthesia of skin: Secondary | ICD-10-CM

## 2013-11-26 DIAGNOSIS — G35 Multiple sclerosis: Secondary | ICD-10-CM

## 2013-11-26 MED ORDER — GADOBENATE DIMEGLUMINE 529 MG/ML IV SOLN
12.0000 mL | Freq: Once | INTRAVENOUS | Status: AC | PRN
  Administered 2013-11-26: 12 mL via INTRAVENOUS

## 2013-12-02 ENCOUNTER — Other Ambulatory Visit: Payer: Self-pay

## 2013-12-02 MED ORDER — GABAPENTIN 300 MG PO CAPS: 300.0000 mg | ORAL_CAPSULE | Freq: Three times a day (TID) | ORAL | Status: DC

## 2013-12-27 HISTORY — PX: CATARACT EXTRACTION W/ INTRAOCULAR LENS  IMPLANT, BILATERAL: SHX1307

## 2013-12-27 HISTORY — PX: INTERCOSTAL NERVE BLOCK: SHX5021

## 2014-01-02 ENCOUNTER — Encounter: Payer: Self-pay | Admitting: Diagnostic Neuroimaging

## 2014-01-02 ENCOUNTER — Telehealth: Payer: Self-pay | Admitting: Diagnostic Neuroimaging

## 2014-01-02 NOTE — Telephone Encounter (Signed)
Left message for patient rescheduling 02/14/14 appointment, Dr. Leta Baptist not in office, letter sent.

## 2014-02-14 ENCOUNTER — Ambulatory Visit: Payer: Medicare Other | Admitting: Diagnostic Neuroimaging

## 2014-03-25 ENCOUNTER — Encounter: Payer: Self-pay | Admitting: Diagnostic Neuroimaging

## 2014-03-25 ENCOUNTER — Encounter (INDEPENDENT_AMBULATORY_CARE_PROVIDER_SITE_OTHER): Payer: Self-pay

## 2014-03-25 ENCOUNTER — Ambulatory Visit (INDEPENDENT_AMBULATORY_CARE_PROVIDER_SITE_OTHER): Payer: Medicare HMO | Admitting: Diagnostic Neuroimaging

## 2014-03-25 VITALS — BP 110/73 | HR 89 | Ht 70.0 in | Wt 131.0 lb

## 2014-03-25 DIAGNOSIS — G373 Acute transverse myelitis in demyelinating disease of central nervous system: Secondary | ICD-10-CM

## 2014-03-25 NOTE — Patient Instructions (Signed)
I will check additional lab tests.  Stop aubagio.  Keep up the good work with nutritional and activity habits.

## 2014-03-25 NOTE — Progress Notes (Signed)
[facility]  PATIENT: [name] DOB: 01/25/1951  REFERRING CLINICIAN:  HISTORY FROM: patient REASON FOR VISIT: follow up   HISTORICAL  CHIEF COMPLAINT:  Chief Complaint  Patient presents with  . Follow-up    MS    HISTORY OF PRESENT ILLNESS:   UPDATE 03/25/14: Since last visit, doing well. MRI thoracic spine and LP done and reviewed with patient. Still with intermittent arm weakness, balance difficulty, urinary issues. Still struggling with colitis. Has had 20 lb weight loss over 6-9 months (no change in diet or exercise).   UPDATE 11/02/13: Since last visit patient had to stop aubagio do to loss of insurance. She was off of medication from June to September 2014. She is restarted the medication has been on for past 21 days. She's doing well otherwise. Continues to have intermittent numbness and tingling in her hands and arms. She feels weakness in her arms and right leg. She continues to have fatigue memory loss numbness weakness difficulty swallowing tremor weight loss.  UPDATE 04/02/13: since last visit, patient continues to have gait difficulty and is working with physical therapy. She's tolerating aubagio better now (hair loss stabilized). Over past 5 weeks she has had some increasing numbness in her right hand and right leg. She continues to use a cane for ambulation. I reviewed her initial symptoms, diagnosis and treatment as well. Patient tells me that her first symptoms and diagnosis was in 1988 with spinal fluid analysis. She tells me she was started on Betaseron in 1993 with in a clinical trial. Over the years she was tried on Avonex, Copaxone, Tysabri, Gilenya, Tecfidera. Interestingly her MRI studies have never demonstrated abnormal T2 hyperintensities.  PRIOR HPI (11/13/12, Dr. Erling Cruz): "63 year old right-handed white married female with bilateral lower and upper extremity weakness, normal MRI studies of the brain and cervical spine, the last  10/02/2010, and abnormal CSF with positive oligoclonal bands in 1995. She has a history of Betaseron therapy for suspected multiple sclerosis and was changed to Avonex in 2008 because of injection site sores. On Avonex she did not do well and was changed to Copaxone but did not tolerate that medication  She has recurrent falls with surgery to her right foot 06/2009 and an injury from a fall 11/10 when she "cracked her pelvis in 2 places and her left hip".  She has osteoporosis and is being followed by Dr. Lynann Bologna. She changed from Reclast to [facility]. She has urinary incontinence followed by Dr. Gaynelle Arabian and colitis symptoms followed by Dr. Watt Climes. She had been on 6 mercaptopurine. This was discontinued for 6 months and then she was started on Tysabri. She  had 12 doses of Tysabri. Prior to beginning Tysabri she was evaluated by Dr. Jacqulynn Cadet regarding the use of that medication. With the lack of positive information except CSF oligoclonal IgG. It was then decided to give her a 6 month trial. She denied side effects from the medication except recurrent  vaginitis. She has  cramps in her right lower  leg and in her feet and hands at night. She has not improved in her neurologic symptoms. She became JC virus anybody positive and had been on 6 mercaptopurine giving her a  risk factor of 1-500  for PML. She discontinued Tysabri 02/2011 and started Gilenya 06/2011. She was seen by Dr. Prudencio Burly 01/26/2012 with evidence of macular edema left eye greater than right. Gilenya was discontinued.  I received a telephone call from Dr. Prudencio Burly 5/31 indicating that her left eye  vision never totally improved. Her visual fields were worse in her left eye. She complained of pain in moving the left eye. He was concerned about retrobulbar neuritis.   He obtained visual acuity 20/25 right eye and 20/50 in the left. I saw her 05/29/2012 with visual acuity of 20/70 right eye 20/200 and the left. There was no Marcus Gunn pupil. Visual evoked responses  were normal. She started Tecfidera 05/2012 and developed GI symptoms. Tecfidera has been discontinued She has severe bladder and bowel incontinence followed with Dr. Gaynelle Arabian which had improved. She has  back pain radiating to buttock and left leg. She has seen Dr. Sherwood Gambler with lumbar MRI at L4-5 and L5-S1 and evidence of degenerative disc disease She has pain in her left buttock which goes into her left leg. she notes increasing problems picking up her left leg. She has been on Aubagio for 2 months. She has intermittent right sided headache pain, lying down suggestive of right occipital neuralgia. This comes and goes"  REVIEW OF SYSTEMS: Full 14 system review of systems performed and notable only for weight loss fatigue swelling in legs trouble swallowing incontinence urination problems joint swelling tremor weakness numbness.   ALLERGIES: Allergies  Allergen Reactions  . Betaseron [Interferon Beta-1b] Other (See Comments)    REACTION : HARDEN TISSUE AT THE INJECTION SITE, REQUIRED REMOVAL   . Gilenya [Fingolimod Hydrochloride] Anaphylaxis    MACULAR EDEMA  . Morphine And Related Other (See Comments)    VEINS BECAME RED STREAKED   . Tysabri [Natalizumab] Other (See Comments)    DEVELOPED JCV ANTIBODY  . Codeine Nausea And Vomiting  . Latex Rash  . Sumatriptan Hives, Swelling and Palpitations    THIS REACTION IS ONLY WHEN USING THE INJECTION.  TABLET FORM CAUSES NO PROBLEMS    HOME MEDICATIONS: Outpatient Prescriptions Prior to Visit  Medication Sig Dispense Refill  . Calcium Carbonate-Vit D-Min (CALTRATE 600+D PLUS) 600-400 MG-UNIT per tablet Take 3 tablets by mouth every morning.       Marland Kitchen dexlansoprazole (DEXILANT) 60 MG capsule Take 60 mg by mouth 2 (two) times daily.      . Multiple Vitamins-Minerals (MULTIVITAMIN PO) Take by mouth. Brand name - Mature Multivitamin from [employer]      . Teriflunomide (AUBAGIO) 14 MG TABS Take 1 tablet by mouth daily.      Marland Kitchen albuterol (PROAIR HFA)  108 (90 BASE) MCG/ACT inhaler Inhale 2 puffs into the lungs as needed.      . budesonide-formoterol (SYMBICORT) 160-4.5 MCG/ACT inhaler Inhale 2 puffs into the lungs 2 (two) times daily.        . butalbital-acetaminophen-caffeine (FIORICET, ESGIC) 50-325-40 MG per tablet Take 1 tablet by mouth every 8 (eight) hours as needed for headache.  30 tablet  0  . gabapentin (NEURONTIN) 300 MG capsule Take 1 capsule (300 mg total) by mouth 3 (three) times daily.  270 capsule  3  . meclizine (ANTIVERT) 25 MG tablet Take 25 mg by mouth 3 (three) times daily as needed. FOR MOTION SICKNESS        No facility-administered medications prior to visit.    PAST MEDICAL HISTORY: Past Medical History  Diagnosis Date  . Multiple sclerosis   . Asthma   . Incontinence   . Joint pain   . Rectal bleeding   . Diarrhea   . 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 Back pain   . Cancer   . Emphysema of  lung     non smoker  . Osteoporosis   . Neuromuscular disorder   . Rectal bleeding   . Visual disturbance   . Weakness   . Nipple lesion   . Multiple sclerosis     PAST SURGICAL HISTORY: Past Surgical History  Procedure Laterality Date  . Tonsillectomy    . Oophorectomy    . Appendectomy  06/22/2011    laparoscopic; Dr Redmond Pulling  . Shoulder surgery    . Bladder suspension    . Bunion removal    . Foot surgery    . Excisional hemorrhoidectomy  08/05/2010    external; Dr Barry Dienes  . Vaginal hysterectomy    . Spine surgery      c3 and c4 fusion and c4 and c5 fusion  . Breast surgery      augmentation and removal    FAMILY HISTORY: Family History  Problem Relation Age of Onset  . Liver disease Father   . Cirrhosis Father   . Stroke Mother   . Cancer Mother     oropharyngeal  . Heart attack Mother   . Cancer Brother     twin; tonsillar?  . Cancer Sister 13    breast ca, lung ca  . Cancer Other     Nephew; appendiceal carcnoid, melanoma  . Cancer Paternal Grandmother      cervical    SOCIAL HISTORY:  History   Social History  . Marital Status: Married    Spouse Name: Tom    Number of Children: 2  . Years of Education: college   Occupational History  . Retired    Social History Main Topics  . Smoking status: Never Smoker   . Smokeless tobacco: Never Used  . Alcohol Use: Yes     Comment: socially  . Drug Use: No  . Sexual Activity: Not on file   Other Topics Concern  . Not on file   Social History Narrative   Pt lives at home with her spouse of 19 years.   Caffeine Use- Drinks caffeine occasionally.     PHYSICAL EXAM  Filed Vitals:   03/25/14 1359  BP: 110/73  Pulse: 89  Height: 5\' 10"  (1.778 m)  Weight: 131 lb (59.421 kg)   Body mass index is 18.8 kg/(m^2).  GENERAL EXAM: Patient is in no distress  CARDIOVASCULAR: Regular rate and rhythm, no murmurs, no carotid bruits  NEUROLOGIC: MENTAL STATUS: awake, alert, language fluent, comprehension intact, naming intact CRANIAL NERVE: no papilledema on fundoscopic exam, pupils equal and reactive to light, visual fields full to confrontation, extraocular muscles intact, no nystagmus, facial sensation and strength symmetric, uvula midline, shoulder shrug symmetric, tongue midline. MOTOR: POSTURAL TREMOR WITH MILD "SEE-SAW" OF BUE WITH EYES CLOSED AND ARMS EXTENDED. Normal bulk and tone. MILD GIVE WAY WEAKNESS IN BUE AND BLE.  SENSORY: DECR IN BUE AND RLE.  COORDINATION: ATAXIA IN BUE FTN. REFLEXES: TRACE IN ARMS, ABSENT IN BLE. GAIT/STATION: SPASTIC GAIT WITH PELVIC TILTING. SMOOTH STRIDE. USES SINGLE POINT CANE.   DIAGNOSTIC DATA (LABS, IMAGING, TESTING) - I reviewed patient records, labs, notes, testing and imaging myself where available.  Lab Results  Component Value Date   WBC 4.9 05/13/2012   HGB 13.9 05/13/2012   HCT 41.0 05/13/2012   MCV 90.9 05/13/2012   PLT 209 05/13/2012      Component Value Date/Time   NA 141 05/13/2012 1050   K 3.6 05/13/2012 1050   CL 105  05/13/2012 1050   CO2 25  05/13/2012 1050   GLUCOSE 120* 05/13/2012 1050   BUN 13 05/13/2012 1050   CREATININE 0.94 05/13/2012 1050   CALCIUM 9.3 05/13/2012 1050   PROT 6.3 03/20/2013 0933   PROT 6.1 06/18/2011 0932   ALBUMIN 3.8 06/18/2011 0932   AST 19 03/20/2013 0933   ALT 21 03/20/2013 0933   ALKPHOS 79 03/20/2013 0933   BILITOT 0.3 03/20/2013 0933   GFRNONAA 65* 05/13/2012 1050   GFRAA 75* 05/13/2012 1050   No results found for this basename: CHOL,  HDL,  LDLCALC,  LDLDIRECT,  TRIG,  CHOLHDL   No results found for this basename: HGBA1C   No results found for this basename: VITAMINB12   No results found for this basename: TSH    I reviewed images myself and agree with interpretation. -VRP  05/29/12 VEP - normal  04/10/13 MRI brain (with and without) - normal  04/10/13 MRI cervical spine (with and without) -  1. Anterior cervical interbody fusion of C4 and C5 with metalhardware 2. No spinal stenosis or foraminal narrowing  3. No abnormal intrinsic or enhancing spinal cord lesions 4. No significant change from MRI on 10/02/10  11/26/13 MRI thoracic spine (with and without): 1. Intrinsic T2 hyperintense spinal cord lesion at T4 level, measuring 3x3x31mm (APxtransxSI) may represent chronic myelitis, gliosis or chronic demyelinating plaque.  2. No abnormal enhancing lesions.  11/08/13 CSF studies: OCB none/zero, IgG index nl, WBC 3, RBC 250, protein 28, glucose 59   ASSESSMENT AND PLAN  64 y.o. year old female  has a past medical history of Multiple sclerosis; Asthma; Incontinence; Joint pain; Rectal bleeding; Diarrhea; Neoplasm of appendix (June 2012); Back pain; Cancer; Emphysema of lung; Osteoporosis; Neuromuscular disorder; Rectal bleeding; Visual disturbance; Weakness; Nipple lesion; and Multiple sclerosis. here with followup evaluation of MS. In the past she has been diagnosed on the basis of "positive oligoclonal bands" from the late 1980s. However MRI brain and cervical spine show no  evidence of demyelinating plaques. Repeat imaging confirms chronic lesion in thoracic spine (T4 level). CSF studies are essentially normal (i.e. No oligoclonal bands).  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 Based on her description this localizes to the thoracic spine. I will check MRI thoracic spine to see if we can confirm any evidence of chronic transverse myelitis/gliosis. Regarding patient's upper extremity symptoms, this may be related to patient's history of cervical spondylosis, disc herniation, status post decompression and fusion. Patient's more diffuse symptoms of fatigue and memory loss, variable, fluctuating neurologic exam with seesaw movements of the upper extremities, may represent a nonorganic etiology or related to other factors.  Ddx: cervical radiculopathy/myelopathy, idiopathic transverse myelitis, neuromyelitis optica  PLAN: 1. Stop Aubagio 2. Continue Gabapentin  3. Transverse myelitis workup labs   Orders Placed This Encounter  Procedures  . NMO IgG Autoantibodies  . Vitamin B12  . Ceruloplasmin  . Copper, Serum  . Vitamin E   Return in about 6 months (around 09/25/2014).     Penni Bombard, MD [id number], [id number] PM Certified in Neurology, Neurophysiology and Neuroimaging  [facility] [address] [place], [place] [address] [phone]

## 2014-03-28 ENCOUNTER — Telehealth: Payer: Self-pay | Admitting: Diagnostic Neuroimaging

## 2014-03-28 NOTE — Telephone Encounter (Signed)
Pt called and asked if someone could call with the results from the blood work she had done on Monday.  Please contact.  Thank you.

## 2014-03-28 NOTE — Telephone Encounter (Signed)
Called pt to inform her per Dr. Leta Baptist that her Lab results were normal except for the NMO antibody is still pending. I informed the pt that once the results come in that someone would be calling her concerning the results. I advised the pt that if she has any other problems, questions or concerns to call the office. Pt verbalized understanding.

## 2014-03-28 NOTE — Telephone Encounter (Signed)
Labs are normal except for NMO antibody, which is still pending. Let patient know. -VRP

## 2014-03-28 NOTE — Telephone Encounter (Signed)
Pt calling requesting Lab work results. Please advise

## 2014-03-29 LAB — VITAMIN E: VITAMIN E (ALPHA TOCOPHEROL): 13.8 mg/L (ref 4.6–17.8)

## 2014-03-29 LAB — COPPER, SERUM: Copper: 108 ug/dL (ref 72–166)

## 2014-03-29 LAB — CERULOPLASMIN: Ceruloplasmin: 24.4 mg/dL (ref 16.0–45.0)

## 2014-03-29 LAB — VITAMIN B12: VITAMIN B 12: 560 pg/mL (ref 211–946)

## 2014-03-29 LAB — NMO IGG AUTOANTIBODIES

## 2014-04-24 ENCOUNTER — Other Ambulatory Visit: Payer: Self-pay | Admitting: Dermatology

## 2014-06-26 ENCOUNTER — Ambulatory Visit: Payer: Medicare Other | Admitting: Diagnostic Neuroimaging

## 2014-08-27 ENCOUNTER — Other Ambulatory Visit: Payer: Self-pay | Admitting: Gastroenterology

## 2015-01-07 ENCOUNTER — Other Ambulatory Visit: Payer: Self-pay | Admitting: Orthopedic Surgery

## 2015-01-09 ENCOUNTER — Encounter (HOSPITAL_BASED_OUTPATIENT_CLINIC_OR_DEPARTMENT_OTHER): Payer: Self-pay | Admitting: *Deleted

## 2015-01-16 ENCOUNTER — Encounter (HOSPITAL_BASED_OUTPATIENT_CLINIC_OR_DEPARTMENT_OTHER): Payer: Self-pay | Admitting: Certified Registered"

## 2015-01-16 ENCOUNTER — Encounter (HOSPITAL_BASED_OUTPATIENT_CLINIC_OR_DEPARTMENT_OTHER)
Admission: RE | Disposition: A | Discharge status: Home / Self Care | Payer: Self-pay | Source: Ambulatory Visit | Attending: Orthopedic Surgery

## 2015-01-16 ENCOUNTER — Ambulatory Visit (HOSPITAL_BASED_OUTPATIENT_CLINIC_OR_DEPARTMENT_OTHER)
Admission: RE | Admit: 2015-01-16 | Discharge: 2015-01-16 | Disposition: A | Discharge status: Home / Self Care | Payer: Medicare HMO | Source: Ambulatory Visit | Attending: Orthopedic Surgery | Admitting: Orthopedic Surgery

## 2015-01-16 ENCOUNTER — Ambulatory Visit (HOSPITAL_BASED_OUTPATIENT_CLINIC_OR_DEPARTMENT_OTHER): Payer: Medicare HMO | Admitting: Certified Registered"

## 2015-01-16 DIAGNOSIS — Z859 Personal history of malignant neoplasm, unspecified: Secondary | ICD-10-CM | POA: Insufficient documentation

## 2015-01-16 DIAGNOSIS — M4690 Unspecified inflammatory spondylopathy, site unspecified: Secondary | ICD-10-CM | POA: Insufficient documentation

## 2015-01-16 DIAGNOSIS — K219 Gastro-esophageal reflux disease without esophagitis: Secondary | ICD-10-CM | POA: Insufficient documentation

## 2015-01-16 DIAGNOSIS — M65312 Trigger thumb, left thumb: Secondary | ICD-10-CM | POA: Insufficient documentation

## 2015-01-16 DIAGNOSIS — G43909 Migraine, unspecified, not intractable, without status migrainosus: Secondary | ICD-10-CM | POA: Diagnosis not present

## 2015-01-16 DIAGNOSIS — Z79899 Other long term (current) drug therapy: Secondary | ICD-10-CM | POA: Insufficient documentation

## 2015-01-16 DIAGNOSIS — J449 Chronic obstructive pulmonary disease, unspecified: Secondary | ICD-10-CM | POA: Insufficient documentation

## 2015-01-16 HISTORY — DX: Sjogren syndrome, unspecified: M35.00

## 2015-01-16 HISTORY — DX: Nonrheumatic mitral (valve) prolapse: I34.1

## 2015-01-16 HISTORY — PX: TRIGGER FINGER RELEASE: SHX641

## 2015-01-16 HISTORY — DX: Neuromuscular dysfunction of bladder, unspecified: N31.9

## 2015-01-16 HISTORY — DX: Repeated falls: R29.6

## 2015-01-16 HISTORY — DX: Unspecified osteoarthritis, unspecified site: M19.90

## 2015-01-16 HISTORY — DX: Family history of other specified conditions: Z84.89

## 2015-01-16 HISTORY — DX: Nausea with vomiting, unspecified: R11.2

## 2015-01-16 HISTORY — DX: Cardiac murmur, unspecified: R01.1

## 2015-01-16 HISTORY — DX: Gastro-esophageal reflux disease without esophagitis: K21.9

## 2015-01-16 HISTORY — DX: Migraine, unspecified, not intractable, without status migrainosus: G43.909

## 2015-01-16 HISTORY — DX: Other specified postprocedural states: Z98.890

## 2015-01-16 HISTORY — DX: Other specified postprocedural states: R11.2

## 2015-01-16 LAB — POCT HEMOGLOBIN-HEMACUE: Hemoglobin: 14 g/dL (ref 12.0–15.0)

## 2015-01-16 SURGERY — RELEASE, A1 PULLEY, FOR TRIGGER FINGER
Anesthesia: Regional | Site: Thumb | Laterality: Left

## 2015-01-16 MED ORDER — MIDAZOLAM HCL 5 MG/5ML IJ SOLN
INTRAMUSCULAR | Status: DC | PRN
  Administered 2015-01-16: 2 mg via INTRAVENOUS

## 2015-01-16 MED ORDER — LACTATED RINGERS IV SOLN
INTRAVENOUS | Status: DC
  Administered 2015-01-16: 08:00:00 via INTRAVENOUS
  Administered 2015-01-16: 10 mL/h via INTRAVENOUS
  Administered 2015-01-16: 08:00:00 via INTRAVENOUS

## 2015-01-16 MED ORDER — FENTANYL CITRATE 0.05 MG/ML IJ SOLN
INTRAMUSCULAR | Status: AC
  Filled 2015-01-16: qty 2

## 2015-01-16 MED ORDER — HYDROMORPHONE HCL 1 MG/ML IJ SOLN: 0.2500 mg | INTRAMUSCULAR | Status: DC | PRN

## 2015-01-16 MED ORDER — MIDAZOLAM HCL 2 MG/ML PO SYRP: 12.0000 mg | ORAL_SOLUTION | Freq: Once | ORAL | Status: DC | PRN

## 2015-01-16 MED ORDER — BUPIVACAINE HCL (PF) 0.25 % IJ SOLN
INTRAMUSCULAR | Status: DC | PRN
  Administered 2015-01-16: 5 mL

## 2015-01-16 MED ORDER — MIDAZOLAM HCL 2 MG/2ML IJ SOLN
INTRAMUSCULAR | Status: AC
  Filled 2015-01-16: qty 2

## 2015-01-16 MED ORDER — CEFAZOLIN SODIUM-DEXTROSE 2-3 GM-% IV SOLR
2.0000 g | INTRAVENOUS | Status: AC
  Administered 2015-01-16: 2 g via INTRAVENOUS

## 2015-01-16 MED ORDER — MEPERIDINE HCL 25 MG/ML IJ SOLN: 6.2500 mg | INTRAMUSCULAR | Status: DC | PRN

## 2015-01-16 MED ORDER — BUPIVACAINE HCL (PF) 0.25 % IJ SOLN
INTRAMUSCULAR | Status: AC
  Filled 2015-01-16: qty 30

## 2015-01-16 MED ORDER — CEFAZOLIN SODIUM-DEXTROSE 2-3 GM-% IV SOLR
INTRAVENOUS | Status: AC
  Filled 2015-01-16: qty 50

## 2015-01-16 MED ORDER — LIDOCAINE HCL (CARDIAC) 20 MG/ML IV SOLN
INTRAVENOUS | Status: DC | PRN
  Administered 2015-01-16: 30 mg via INTRAVENOUS

## 2015-01-16 MED ORDER — PROPOFOL INFUSION 10 MG/ML OPTIME
INTRAVENOUS | Status: DC | PRN
  Administered 2015-01-16: 75 ug/kg/min via INTRAVENOUS

## 2015-01-16 MED ORDER — LIDOCAINE HCL (PF) 0.5 % IJ SOLN
INTRAMUSCULAR | Status: DC | PRN
  Administered 2015-01-16: 30 mL via INTRAVENOUS

## 2015-01-16 MED ORDER — PROPOFOL 10 MG/ML IV EMUL
INTRAVENOUS | Status: AC
  Filled 2015-01-16: qty 50

## 2015-01-16 MED ORDER — CHLORHEXIDINE GLUCONATE 4 % EX LIQD: 60.0000 mL | Freq: Once | CUTANEOUS | Status: DC

## 2015-01-16 MED ORDER — FENTANYL CITRATE 0.05 MG/ML IJ SOLN: 50.0000 ug | INTRAMUSCULAR | Status: DC | PRN

## 2015-01-16 MED ORDER — MIDAZOLAM HCL 2 MG/2ML IJ SOLN: 1.0000 mg | INTRAMUSCULAR | Status: DC | PRN

## 2015-01-16 MED ORDER — ONDANSETRON HCL 4 MG/2ML IJ SOLN
INTRAMUSCULAR | Status: DC | PRN
  Administered 2015-01-16: 4 mg via INTRAVENOUS

## 2015-01-16 MED ORDER — HYDROCODONE-ACETAMINOPHEN 5-325 MG PO TABS: ORAL_TABLET | ORAL | Status: DC

## 2015-01-16 MED ORDER — FENTANYL CITRATE 0.05 MG/ML IJ SOLN
INTRAMUSCULAR | Status: DC | PRN
  Administered 2015-01-16: 50 ug via INTRAVENOUS

## 2015-01-16 MED ORDER — ONDANSETRON HCL 4 MG/2ML IJ SOLN
4.0000 mg | Freq: Once | INTRAMUSCULAR | Status: DC | PRN
Start: 2015-01-16 — End: 2015-01-16

## 2015-01-16 MED ORDER — SUCCINYLCHOLINE CHLORIDE 20 MG/ML IJ SOLN
INTRAMUSCULAR | Status: AC
  Filled 2015-01-16: qty 1

## 2015-01-16 SURGICAL SUPPLY — 35 items
BANDAGE COBAN STERILE 2 (GAUZE/BANDAGES/DRESSINGS) ×3 IMPLANT
BANDAGE GAUZE 4  KLING STR (GAUZE/BANDAGES/DRESSINGS) ×3 IMPLANT
BLADE MINI RND TIP GREEN BEAV (BLADE) IMPLANT
BLADE SURG 15 STRL LF DISP TIS (BLADE) ×2 IMPLANT
BLADE SURG 15 STRL SS (BLADE) ×4
BNDG COHESIVE 3X5 TAN STRL LF (GAUZE/BANDAGES/DRESSINGS) ×3 IMPLANT
BNDG CONFORM 2 STRL LF (GAUZE/BANDAGES/DRESSINGS) ×3 IMPLANT
BNDG ESMARK 4X9 LF (GAUZE/BANDAGES/DRESSINGS) IMPLANT
CHLORAPREP W/TINT 26ML (MISCELLANEOUS) ×3 IMPLANT
CORDS BIPOLAR (ELECTRODE) ×3 IMPLANT
COVER BACK TABLE 60X90IN (DRAPES) ×3 IMPLANT
COVER MAYO STAND STRL (DRAPES) ×3 IMPLANT
CUFF TOURNIQUET SINGLE 18IN (TOURNIQUET CUFF) ×3 IMPLANT
DRAPE EXTREMITY T 121X128X90 (DRAPE) ×3 IMPLANT
DRAPE SURG 17X23 STRL (DRAPES) ×3 IMPLANT
GAUZE SPONGE 4X4 12PLY STRL (GAUZE/BANDAGES/DRESSINGS) ×3 IMPLANT
GAUZE XEROFORM 1X8 LF (GAUZE/BANDAGES/DRESSINGS) ×3 IMPLANT
GLOVE BIO SURGEON STRL SZ7.5 (GLOVE) ×3 IMPLANT
GLOVE BIOGEL PI IND STRL 8 (GLOVE) ×1 IMPLANT
GLOVE BIOGEL PI INDICATOR 8 (GLOVE) ×2
GOWN STRL REUS W/ TWL LRG LVL3 (GOWN DISPOSABLE) ×1 IMPLANT
GOWN STRL REUS W/TWL LRG LVL3 (GOWN DISPOSABLE) ×2
GOWN STRL REUS W/TWL XL LVL3 (GOWN DISPOSABLE) ×3 IMPLANT
NEEDLE HYPO 25X1 1.5 SAFETY (NEEDLE) IMPLANT
NS IRRIG 1000ML POUR BTL (IV SOLUTION) ×3 IMPLANT
PACK BASIN DAY SURGERY FS (CUSTOM PROCEDURE TRAY) ×3 IMPLANT
PADDING CAST ABS 4INX4YD NS (CAST SUPPLIES) ×2
PADDING CAST ABS COTTON 4X4 ST (CAST SUPPLIES) ×1 IMPLANT
SPONGE GAUZE 4X4 12PLY STER LF (GAUZE/BANDAGES/DRESSINGS) ×3 IMPLANT
STOCKINETTE 4X48 STRL (DRAPES) ×3 IMPLANT
SUT ETHILON 4 0 PS 2 18 (SUTURE) ×3 IMPLANT
SYR BULB 3OZ (MISCELLANEOUS) ×3 IMPLANT
SYR CONTROL 10ML LL (SYRINGE) IMPLANT
TOWEL OR 17X24 6PK STRL BLUE (TOWEL DISPOSABLE) ×6 IMPLANT
UNDERPAD 30X30 INCONTINENT (UNDERPADS AND DIAPERS) ×3 IMPLANT

## 2015-01-16 NOTE — Op Note (Signed)
[id number] 

## 2015-01-16 NOTE — Op Note (Signed)
[name], [name]            ACCOUNT NO.:  [id number]  MEDICAL RECORD NO.:  [id number]  LOCATION:                                 FACILITY:  PHYSICIAN:  Leanora Cover, MD             DATE OF BIRTH:  DATE OF PROCEDURE:  01/16/2015 DATE OF DISCHARGE:                              OPERATIVE REPORT   PREOPERATIVE DIAGNOSIS:  Left trigger thumb.  POSTOPERATIVE DIAGNOSIS:  Left trigger thumb.  PROCEDURE:  Left trigger release.  SURGEON:  Leanora Cover, MD.  ASSISTANT:  None.  ANESTHESIA:  Bier block with sedation.  IV FLUIDS:  Per anesthesia flow sheet.  ESTIMATED BLOOD LOSS:  Minimal.  COMPLICATIONS:  None.  SPECIMENS:  None.  TOURNIQUET TIME:  19 minutes.  DISPOSITION:  Stable to PACU.  INDICATIONS:  [name] is a 64 year old female who has had triggering of the left thumb.  This has been injected twice with recurrence.  She wished to have it released.  Risks, benefits, alternatives of surgery were discussed including risk of blood loss, infection, damage to nerves, vessels, tendons, ligaments, bone; failure of surgery; need for additional surgery, complications with wound healing, continued pain, and recurrence of triggering.  She voiced understanding of these risks and elected to proceed.  OPERATIVE COURSE:  After being identified preoperatively by myself, the patient and I agreed upon procedure and site of procedure.  Surgical site was marked.  The risks, benefits, and alternatives of surgery were reviewed and she wished to proceed.  Surgical consent had been signed. She was given IV Ancef as preoperative antibiotic prophylaxis.  She was transferred to the operating room and placed on the operating table in supine position with the left upper extremity on arm board.  Bier block anesthesia was induced by anesthesiologist.  The left upper extremity was prepped and draped in normal sterile orthopedic fashion.  Surgical pause was performed between surgeons,  Anesthesia, and operating room staff, and all were in agreement as to the patient, procedure, and site of procedure.  Tourniquet at the proximal aspect of the forearm had been inflated for the Bier block.  Incision was made at the proximal flexion crease of the thumb and carried into subcutaneous tissues by spreading technique.  The radial and ulnar digital nerves were identified and protected throughout the case.  The A1 pulley was identified and sharply incised.  Care was taken to ensure complete decompression.  The tendon was brought through the wound and there was no triggering.  The thumb was placed through a range of motion.  There was no clicking.  Wounds were copiously irrigated with sterile saline and were closed with 4-0 nylon in a horizontal mattress fashion.  It was injected with 5 mL of 0.25% plain Marcaine to aid in postoperative analgesia.  It was then dressed with sterile Xeroform, 4x4s, and wrapped with a Coban dressing lightly.  The tourniquet was deflated at 19 minutes.  Fingertips were pink with brisk capillary refill after deflation of the tourniquet. Operative drapes were broken down.  The patient was awoken from anesthesia safely.  She was transferred back to stretcher and taken to PACU in stable condition.  I  will see her back in the office in 1 week for postoperative followup.  I will give her Norco 5/325, 1-2 p.o. q.6 hours p.r.n. pain, dispensed #30.     Leanora Cover, MD     KK/MEDQ  D:  01/16/2015  T:  01/16/2015  Job:  [id number]

## 2015-01-16 NOTE — Anesthesia Postprocedure Evaluation (Signed)
Anesthesia Post Note  Patient: [name]  Procedure(s) Performed: Procedure(s) (LRB): LEFT THUMB TRIGGER RELEASE  (Left)  Anesthesia type: general  Patient location: PACU  Post pain: Pain level controlled  Post assessment: Patient's Cardiovascular Status Stable  Last Vitals:  Filed Vitals:   01/16/15 1010  BP: 127/65  Pulse: 68  Temp: 36.6 C  Resp: 18    Post vital signs: Reviewed and stable  Level of consciousness: sedated  Complications: No apparent anesthesia complications

## 2015-01-16 NOTE — Transfer of Care (Signed)
Immediate Anesthesia Transfer of Care Note  Patient: [name]  Procedure(s) Performed: Procedure(s): LEFT THUMB TRIGGER RELEASE  (Left)  Patient Location: PACU  Anesthesia Type:Bier block  Level of Consciousness: awake, alert , oriented and patient cooperative  Airway & Oxygen Therapy: Patient Spontanous Breathing and Patient connected to face mask oxygen  Post-op Assessment: Report given to PACU RN and Post -op Vital signs reviewed and stable  Post vital signs: Reviewed and stable  Complications: No apparent anesthesia complications

## 2015-01-16 NOTE — Anesthesia Preprocedure Evaluation (Addendum)
Anesthesia Evaluation  Patient identified by MRN, date of birth, ID band Patient awake    Reviewed: Allergy & Precautions, NPO status , Patient's Chart, lab work & pertinent test results  History of Anesthesia Complications (+) PONV  Airway Mallampati: I  TM Distance: >3 FB Neck ROM: Full    Dental   Pulmonary shortness of breath, COPD         Cardiovascular     Neuro/Psych  Headaches,    GI/Hepatic GERD-  Medicated,  Endo/Other    Renal/GU      Musculoskeletal  (+) Arthritis -,   Abdominal   Peds  Hematology   Anesthesia Other Findings   Reproductive/Obstetrics                            Anesthesia Physical Anesthesia Plan  ASA: II  Anesthesia Plan: Bier Block   Post-op Pain Management:    Induction: Intravenous  Airway Management Planned: LMA and Simple Face Mask  Additional Equipment:   Intra-op Plan:   Post-operative Plan:   Informed Consent: I have reviewed the patients History and Physical, chart, labs and discussed the procedure including the risks, benefits and alternatives for the proposed anesthesia with the patient or authorized representative who has indicated his/her understanding and acceptance.     Plan Discussed with: CRNA and Surgeon  Anesthesia Plan Comments:         Anesthesia Quick Evaluation

## 2015-01-16 NOTE — Brief Op Note (Signed)
01/16/2015  9:01 AM  PATIENT:  [name]  64 y.o. female  PRE-OPERATIVE DIAGNOSIS:  LEFT THUMB TRIGGER DIGIT   POST-OPERATIVE DIAGNOSIS:  LEFT THUMB TRIGGER DIGIT   PROCEDURE:  Procedure(s): LEFT THUMB TRIGGER RELEASE  (Left)  SURGEON:  Surgeon(s) and Role:    * Leanora Cover, MD - Primary  PHYSICIAN ASSISTANT:   ASSISTANTS: none   ANESTHESIA:   Bier block with sedation  EBL:     BLOOD ADMINISTERED:none  DRAINS: none   LOCAL MEDICATIONS USED:  MARCAINE     SPECIMEN:  No Specimen  DISPOSITION OF SPECIMEN:  N/A  COUNTS:  YES  TOURNIQUET:   Total Tourniquet Time Documented: Forearm (Left) - 19 minutes Total: Forearm (Left) - 19 minutes   DICTATION: .Other Dictation: Dictation Number [id number]  PLAN OF CARE: Discharge to home after PACU  PATIENT DISPOSITION:  PACU - hemodynamically stable.

## 2015-01-16 NOTE — H&P (Signed)
[name] is an 64 y.o. female.   Chief Complaint: trigger thumb HPI: 64 yo rhd female with triggering of left thumb.  Has had this injected twice with recurrence.  She wishes to have a trigger digit release.    Past Medical History  Diagnosis Date  . Cancer   . Osteoporosis   . Multiple sclerosis   . PONV (postoperative nausea and vomiting)   . Migraines   . COPD (chronic obstructive pulmonary disease)     denies SOB with ADLs  . Arthritis     back  . Sjogren's disease     dryness of eyes, mouth  . GERD (gastroesophageal reflux disease)   . Neurogenic bladder   . Trigger thumb of left hand 12/2014  . Mitral valve prolapse     states no problem  . Heart murmur   . 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 Frequent falls     due to MS  . Dental crowns present     also dental caps  . Family history of adverse reaction to anesthesia     pt's mother had hx. of post-op nausea    Past Surgical History  Procedure Laterality Date  . Tonsillectomy  age 65  . Oophorectomy Bilateral   . Vaginal hysterectomy  age 37  . Cataract extraction Bilateral 2015  . Hemorroidectomy  08/05/2010    2-column hemorrhoidectomy  . Shoulder arthroscopy Bilateral   . Appendectomy  06/22/2011    laparoscopic  . Bunionectomy    . Cervical spine surgery  2002; 2005    x 2 - C3-4; C4-5  . Breast enhancement surgery Bilateral   . Breast implant removal Bilateral   . Incontinence surgery    . Elbow surgery Right   . Intercostal nerve block  2015    occipital    Family History  Problem Relation Age of Onset  . Liver disease Father   . Cirrhosis Father   . Stroke Mother   . Cancer Mother     oropharyngeal  . Heart attack Mother   . Anesthesia problems Mother     post-op nausea  . Cancer Brother     twin; tonsillar?  . Cancer Sister 36    breast ca, lung ca  . Cancer Other     Nephew; appendiceal carcnoid, melanoma  . Cancer Paternal Grandmother     cervical    Social History:  reports that she has never smoked. She has never used smokeless tobacco. She reports that she drinks alcohol. She reports that she does not use illicit drugs.  Allergies:  Allergies  Allergen Reactions  . Other Shortness Of Breath    PURPLE LETTUCE  . Betaseron [Interferon Beta-1b] Other (See Comments)    HARD NODULAR AREAS AT INJECTION SITE  . Codeine Nausea And Vomiting  . Gilenya [Fingolimod Hydrochloride] Other (See Comments)    MACULAR EDEMA  . Morphine And Related Other (See Comments)    IV ROUTE - RED STREAKS OF VEIN  . Sumatriptan Nausea Only    INJECTABLE ONLY - RAPID HEART RATE, FLUSHING  . Tysabri [Natalizumab] Other (See Comments)    DEVELOPED JCV ANTIBODY  . Latex Rash    Medications Prior to Admission  Medication Sig Dispense Refill  . albuterol (PROVENTIL HFA;VENTOLIN HFA) 108 (90 BASE) MCG/ACT inhaler Inhale 2 puffs into the lungs 2 (two) times daily.    . budesonide (ENTOCORT EC) 3 MG 24 hr capsule Take  3 mg by mouth 2 (two) times daily.    . Calcium Carbonate-Vit D-Min (CALTRATE 600+D PLUS) 600-400 MG-UNIT per tablet Take 3 tablets by mouth every morning.     . diphenhydramine-acetaminophen (TYLENOL PM) 25-500 MG TABS Take 1 tablet by mouth at bedtime as needed.    . loratadine (CLARITIN) 10 MG tablet Take 10 mg by mouth daily.    . mirabegron ER (MYRBETRIQ) 50 MG TB24 tablet Take 50 mg by mouth daily.    . Multiple Vitamins-Minerals (MULTIVITAMIN PO) Take by mouth. Brand name - Mature Multivitamin from [employer]    . pantoprazole (PROTONIX) 40 MG tablet Take 40 mg by mouth daily.      No results found for this or any previous visit (from the past 48 hour(s)).  No results found.   A comprehensive review of systems was negative except for: Constitutional: positive for weight loss Eyes: positive for cataracts, contacts/glasses and visual disturbance Ears, nose, mouth, throat, and face: positive for hearing loss and tinnitus Respiratory:  positive for asthma and cough Genitourinary: positive for dysuria Hematologic/lymphatic: positive for easy bruising Musculoskeletal: positive for arthralgias and back pain Neurological: positive for gait problems  Blood pressure 107/67, pulse 75, temperature 97.8 F (36.6 C), temperature source Oral, resp. rate 18, height 5\' 10"  (1.778 m), weight 63.73 kg (140 lb 8 oz), SpO2 99 %.  General appearance: alert, cooperative and appears stated age Head: Normocephalic, without obvious abnormality, atraumatic Neck: supple, symmetrical, trachea midline Resp: clear to auscultation bilaterally Cardio: regular rate and rhythm GI: non tender Extremities: intact sensation and capillary refill all digits.  +epl/fpl/io.  no wounds. Pulses: 2+ and symmetric Skin: Skin color, texture, turgor normal. No rashes or lesions Neurologic: Grossly normal Incision/Wound: none  Assessment/Plan Left trigger thumb.  Non operative and operative treatment options were discussed with the patient and patient wishes to proceed with operative treatment. Risks, benefits, and alternatives of surgery were discussed and the patient agrees with the plan of care.   [name] R 01/16/2015, 8:29 AM

## 2015-01-16 NOTE — Discharge Instructions (Addendum)

## 2015-01-16 NOTE — Anesthesia Procedure Notes (Addendum)
Anesthesia Regional Block:  Bier block (IV Regional)  Pre-Anesthetic Checklist: ,, timeout performed, Correct Patient, Correct Site, Correct Laterality, Correct Procedure,, site marked, surgical consent,, at surgeon's request Needles:  Injection technique: Single-shot  Needle Type: Other      Needle Gauge: 20 and 20 G    Additional Needles: Bier block (IV Regional) Narrative:   Performed by: Personally    Procedure Name: MAC Date/Time: 01/16/2015 8:35 AM Performed by: Morrissa Shein Pre-anesthesia Checklist: Patient identified, Emergency Drugs available, Suction available, Patient being monitored and Timeout performed Patient Re-evaluated:Patient Re-evaluated prior to inductionOxygen Delivery Method: Simple face mask

## 2015-01-17 ENCOUNTER — Encounter (HOSPITAL_BASED_OUTPATIENT_CLINIC_OR_DEPARTMENT_OTHER): Payer: Self-pay | Admitting: Orthopedic Surgery

## 2015-02-05 ENCOUNTER — Encounter: Payer: Self-pay | Admitting: Neurology

## 2015-02-05 ENCOUNTER — Ambulatory Visit (INDEPENDENT_AMBULATORY_CARE_PROVIDER_SITE_OTHER): Payer: Medicare HMO | Admitting: Neurology

## 2015-02-05 VITALS — BP 140/68 | HR 78 | Resp 14 | Ht 70.0 in | Wt 142.8 lb

## 2015-02-05 DIAGNOSIS — R5383 Other fatigue: Secondary | ICD-10-CM | POA: Diagnosis not present

## 2015-02-05 DIAGNOSIS — R350 Frequency of micturition: Secondary | ICD-10-CM | POA: Diagnosis not present

## 2015-02-05 DIAGNOSIS — R261 Paralytic gait: Secondary | ICD-10-CM | POA: Insufficient documentation

## 2015-02-05 DIAGNOSIS — R1314 Dysphagia, pharyngoesophageal phase: Secondary | ICD-10-CM | POA: Diagnosis not present

## 2015-02-05 DIAGNOSIS — G35 Multiple sclerosis: Secondary | ICD-10-CM

## 2015-02-05 DIAGNOSIS — G373 Acute transverse myelitis in demyelinating disease of central nervous system: Secondary | ICD-10-CM

## 2015-02-05 DIAGNOSIS — G0489 Other myelitis: Secondary | ICD-10-CM

## 2015-02-05 HISTORY — DX: Frequency of micturition: R35.0

## 2015-02-05 NOTE — Progress Notes (Signed)
[facility]  PATIENT: [name] DOB: 10-12-1951  REFERRING CLINICIAN: Crist Infante HISTORY FROM: patient REASON FOR VISIT: transverse myelitis, MS?   HISTORICAL  CHIEF COMPLAINT:  Chief Complaint  Patient presents with  . Numbness  . Extremity Weakness    Sts.around 1986 she saw Dr. Erling Cruz for unilateral numbness (can't remember which side), fatigue, gait/coordination problems.  Sts. mri at that time showed no lesions, but lp indicated MS. She started [employer], had inj. site problems, relapses of sx on Betaseron.  In 1993 she had sudden paralysis from waist down--was seen at [facility] and dx. with transverse myelitis.  She was still on Betaseron at that time, and additionally was given IV SoluMedrol for exacerbations of sx. When she was not able  . Gait Problem    to tolerate Betaseron any longer, she tried Gilenya, Tysabri, Avonex, but was either allergic to or unable to tolerate all of these. She has not been on any MS therapy in about a yr.  Sts. last mri was 1-2 yrs. ago.  Since that time she reports doing "fairly well."  Sts. fatigue is some worse, she continues to have gait/balance problems with falls more often when she is tired.  She also c/o weakness in her arms.  Also c/o "quivering" feeling in right arm/hand when she is still/trying to go to sleep./fim    HISTORY OF PRESENT ILLNESS:  [name] is a 64 yo woman who presented with extreme fatigue and sleepiness in 1987.   She saw Dr. Erling Cruz but no firm diagnosis was made.   The next year, she had episodes of dropping items.   The next year, 1989, she had severe numbness in one side of her body.  She had clumsiness and poor gait.  MRI was reportedly normal.     She saw Dr. Erling Cruz who did an LP that showed oligoclonal bands.   She received IV steroids and was told she had MS.   In 1993, she woke up numb from the waist down in both legs.  This numbness was more intense than the prior episodes and  gait was very poor.    She was admitted x 28 days, receiving many days of IV steroids followed by Rehab.     An MRI of the brain was normal but the MRI of the thoracic spine showed a plaque at T4.     She was started on Betaseron and did well in general.  She had some fluctuating symptom but nothing resembling any of the other 3 episodes.   In November 2014 she had additional imaging studies showing a normal MRI of the brain, an abnormal MRI of the thoracic spine with a focus at T4, and a normal cervical spinal cord. She did have evidence of prior fusion surgery in the neck. There is no myelopathy in the cervical spine. She also underwent a lumbar puncture in November 2014. It was normal and did not show oligoclonal bands or increased IgG index.  She was on Betaseron x many years and stopped due to large painful knots in the legs.  She was switched to Avonex for a short time and had a possible excacerbation treated with Solu-Medrol.     About 4 -5 years ago she went on Tysabri x one year but stopped due to being JCV Ab positive when tested.    She then went on Gilenya but had macula edema bilaterally.  Vision got better after stopping Gilenya.   She  thinks she was on Tecfidera briefly but had tolerability issues.   Then she was on Aubagio for several months but stopped due to insurance issues. She has stayed off medications the past 1 1/2.      She feels she is doing worse off medications.  Specifically, she is noting more gait issues and has tripped and fallen more.    She notes left leg is worse than the right.  She uses the cane mostly.    She falls more when tired and sometimes will use a wheelchair then.   He reports numbness in both hands of the left foot.  This is tingling mor ethan pain.    She notes a mild tremor sensation in the right hand and arm.  She has had issues with urinary frequency and incontinence since the early 1990s. She is currently on Myrbetriq with minimal benefit.   She also  reports difficulty with urinary hesitancy. He does not feel that she empties. Additionally, she has had fecal incontinence. This occurs a few times a week and she usually wears depends or other pads.  She has noted a lot of of fatigue. She has reduced stamina physically and also has cognitive fatigue with difficulty focusing. Her family has noticed other cognitive issues such as reduced memory. She sometimes has difficulty coming up with the right words. She has had difficulty multitasking and completing tasks.     She denies depression or anxiety.  He falls asleep easily and is able to fall back asleep easily when she wakes up to use the bathroom. This occurs 3 or 4 times a night on medication but was more frequent without   REVIEW OF SYSTEMS:  Constitutional: No fevers, chills, sweats, or change in .  She rpeorts fatigue Eyes: No visual changes, double vision, eye pain Ear, nose and throat: No hearing loss, ear pain, nasal congestion, sore throat Cardiovascular: No chest pain, palpitations Respiratory:  No shortness of breath at rest or with exertion.   No wheezes GastrointestinaI: No nausea, vomiting, diarrhea, abdominal pain.  Reports fecal incontinence.   Reports dysphagia Genitourinary:  see above Musculoskeletal:  No neck pain, back pain.   Hasmuscle cramps Integumentary: No rash, pruritus, skin lesions Neurological: as above Psychiatric: No depression at this time.  No anxiety Endocrine: No palpitations, diaphoresis, change in appetite, change in weigh or increased thirst Hematologic/Lymphatic:  No anemia, purpura, petechiae. Allergic/Immunologic: No itchy/runny eyes, nasal congestion, recent allergic reactions, rashes  ALLERGIES: Allergies  Allergen Reactions  . Other Shortness Of Breath    PURPLE LETTUCE  . Betaseron [Interferon Beta-1b] Other (See Comments)    HARD NODULAR AREAS AT INJECTION SITE  . Codeine Nausea And Vomiting  . Gilenya [Fingolimod Hydrochloride] Other  (See Comments)    MACULAR EDEMA  . Morphine And Related Other (See Comments)    IV ROUTE - RED STREAKS OF VEIN  . Sumatriptan Nausea Only    INJECTABLE ONLY - RAPID HEART RATE, FLUSHING  . Tysabri [Natalizumab] Other (See Comments)    DEVELOPED JCV ANTIBODY  . Latex Rash    HOME MEDICATIONS: Outpatient Prescriptions Prior to Visit  Medication Sig Dispense Refill  . albuterol (PROVENTIL HFA;VENTOLIN HFA) 108 (90 BASE) MCG/ACT inhaler Inhale 2 puffs into the lungs 2 (two) times daily.    . budesonide (ENTOCORT EC) 3 MG 24 hr capsule Take 3 mg by mouth 2 (two) times daily.    . Calcium Carbonate-Vit D-Min (CALTRATE 600+D PLUS) 600-400 MG-UNIT per tablet Take 3 tablets  by mouth every morning.     . diphenhydramine-acetaminophen (TYLENOL PM) 25-500 MG TABS Take 1 tablet by mouth at bedtime as needed.    Marland Kitchen HYDROcodone-acetaminophen (NORCO) 5-325 MG per tablet 1-2 tabs po q6 hours prn pain 30 tablet 0  . loratadine (CLARITIN) 10 MG tablet Take 10 mg by mouth daily.    . mirabegron ER (MYRBETRIQ) 50 MG TB24 tablet Take 50 mg by mouth daily.    . Multiple Vitamins-Minerals (MULTIVITAMIN PO) Take by mouth. Brand name - Mature Multivitamin from [employer]    . pantoprazole (PROTONIX) 40 MG tablet Take 40 mg by mouth daily.     No facility-administered medications prior to visit.    PAST MEDICAL HISTORY: Past Medical History  Diagnosis Date  . Cancer   . Osteoporosis   . Multiple sclerosis   . PONV (postoperative nausea and vomiting)   . Migraines   . COPD (chronic obstructive pulmonary disease)     denies SOB with ADLs  . Arthritis     back  . Sjogren's disease     dryness of eyes, mouth  . GERD (gastroesophageal reflux disease)   . Neurogenic bladder   . Trigger thumb of left hand 12/2014  . Mitral valve prolapse     states no problem  . Heart murmur   . 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 Frequent falls     due to MS  . Dental crowns present     also dental  caps  . Family history of adverse reaction to anesthesia     pt's mother had hx. of post-op nausea  . Movement disorder   . Transverse myelitis     PAST SURGICAL HISTORY: Past Surgical History  Procedure Laterality Date  . Tonsillectomy  age 57  . Oophorectomy Bilateral   . Vaginal hysterectomy  age 43  . Cataract extraction Bilateral 2015  . Hemorroidectomy  08/05/2010    2-column hemorrhoidectomy  . Shoulder arthroscopy Bilateral   . Appendectomy  06/22/2011    laparoscopic  . Bunionectomy    . Cervical spine surgery  2002; 2005    x 2 - C3-4; C4-5  . Breast enhancement surgery Bilateral   . Breast implant removal Bilateral   . Incontinence surgery    . Elbow surgery Right   . Intercostal nerve block  2015    occipital  . Trigger finger release Left 01/16/2015    Procedure: LEFT THUMB TRIGGER RELEASE ;  Surgeon: Leanora Cover, MD;  Location: [place];  Service: Orthopedics;  Laterality: Left;    FAMILY HISTORY: Family History  Problem Relation Age of Onset  . Liver disease Father   . Cirrhosis Father   . Stroke Mother   . Cancer Mother     oropharyngeal  . Heart attack Mother   . Anesthesia problems Mother     post-op nausea  . Cancer Brother     twin; tonsillar?  . Cancer Sister 71    breast ca, lung ca  . Cancer Other     Nephew; appendiceal carcnoid, melanoma  . Cancer Paternal Grandmother     cervical    SOCIAL HISTORY:  History   Social History  . Marital Status: Married    Spouse Name: Gershon Mussel  . Number of Children: 2  . Years of Education: college   Occupational History  . Retired    Social History Main Topics  . Smoking status: Never Smoker   .  Smokeless tobacco: Never Used  . Alcohol Use: Yes     Comment: occasionally  . Drug Use: No  . Sexual Activity: Not on file   Other Topics Concern  . Not on file   Social History Narrative   Pt lives at home with her spouse of 35 years.   Caffeine Use- Drinks caffeine  occasionally.     PHYSICAL EXAM  Filed Vitals:   02/05/15 0928  BP: 140/68  Pulse: 78  Resp: 14  Height: 5\' 10"  (1.778 m)  Weight: 142 lb 12.8 oz (64.774 kg)    Body mass index is 20.49 kg/(m^2).   General: The patient is well-developed and well-nourished and in no acute distress  Eyes:  Funduscopic exam shows normal optic discs and retinal vessels.  Neck: The neck is supple, no carotid bruits are noted.  The neck is nontender.  Respiratory: The respiratory examination is clear.  Cardiovascular: The cardiovascular examination reveals a regular rate and rhythm, no murmurs, gallops or rubs are noted.  Skin: Extremities are without significant edema.  Neurologic Exam  Mental status: The patient is alert and oriented x 3 at the time of the examination. The patient has apparent normal recent and remote memory, with an apparently normal attention span and concentration ability.   Speech is normal.  Cranial nerves: Extraocular movements are full. Pupils are equal, round, and reactive to light and accomodation.  Visual fields are full.  Facial symmetry is present. There is good facial sensation to soft touch bilaterally.Facial strength is normal.  Trapezius and sternocleidomastoid strength is normal. No dysarthria is noted.  The tongue is midline, and the patient has symmetric elevation of the soft palate. Hearing mildly better on the right  Motor:  Muscle bulk and tone are normal. Strength is  5 / 5 in the arms and right leg but 4/5 in ankle dorsiflexion 3/5 EHL.   Sensory: Sensory testing shows reduced sensation to touch in the left thumb region and reduced vibrartion in left hand.    Also reduced sensation to touch in peroneal/L5 distribution of left foot and decreased vibration in lower left leg/foot  Coordination: Cerebellar testing reveals good finger-nose-finger and reduced heel-to-shin, worse on left.  Gait and station: Station is stable eyes open.  Gait is slightly spastic  with a moderate left foot drop.   She can not tandem.  Romberg is positive.  Reflexes: Deep tendon reflexes are symmetric and normal bilaterally. Plantar responses are normal.  25 foot timed walk (average of 2 runs) is 8.9 seconds    DIAGNOSTIC DATA (LABS, IMAGING, TESTING) - I reviewed patient records, labs, notes, testing and imaging myself where available.     ASSESSMENT AND PLAN  Multiple sclerosis - Plan: Vitamin D, 25-hydroxy, MR Brain W Wo Contrast, MR Cervical Spine W Wo Contrast, MR Thoracic Spine W Wo Contrast, CMP  Transverse myelitis - Plan: Vitamin D, 25-hydroxy, ANA w/Reflex if Positive, MR Brain W Wo Contrast, MR Cervical Spine W Wo Contrast, MR Thoracic Spine W Wo Contrast, CMP  Dysphagia, pharyngoesophageal phase - Plan: Vitamin D, 25-hydroxy, ANA w/Reflex if Positive, MR Brain W Wo Contrast, CMP  Spastic gait  Urinary frequency  Other fatigue   In summary, Wendie Diskin is a 65 year old woman who carries a diagnosis of multiple sclerosis and continues to have difficulty with spastic gait, urinary frequency, fatigue and more recently has had difficulties with dysphagia. MRIs show a transverse myelitis at T4 but could easily explain some of her symptoms such as  the poor gait, fatigue and bladder dysfunction. The MRIs of the brain have been normal. I had a long discussion with her about the possibility of MS. Although her symptoms are definitely compatible with that diagnosis, a normal MRI for many years would be unlikely therefore I cannot be certain of her diagnosis. More likely, she has sequela from her transverse myelitis and perhaps other etiologies for her dysphagia.  She has been off any disease modifying therapy for over a year. I will obtain MRI of the brain, cervical and thoracic spine as for the possibility of further demyelination if further changes are noted, I would then recommend that she get back on Aubagio or other medications. If the dysphagia  worsens I would recommend that she see GI for further evaluation.  Her gait is poor, likely from the demyelinating plaque in the spinal cord. We discussed a trial of Ampyra and I will check her renal function and prescribe 10 mg by mouth every 12 hours if creatinine is fine.  Richard A. Felecia Shelling, MD, PhD 04/10/8308, 4:07 PM Certified in Neurology, Clinical Neurophysiology, Sleep Medicine, Pain Medicine and Neuroimaging  [facility] [address] [place], [place] [address] [phone]

## 2015-02-06 LAB — COMPREHENSIVE METABOLIC PANEL
ALBUMIN: 4.8 g/dL (ref 3.6–4.8)
ALK PHOS: 60 IU/L (ref 39–117)
ALT: 15 IU/L (ref 0–32)
AST: 18 IU/L (ref 0–40)
Albumin/Globulin Ratio: 2.5 (ref 1.1–2.5)
BUN / CREAT RATIO: 21 (ref 11–26)
BUN: 17 mg/dL (ref 8–27)
Bilirubin Total: 0.3 mg/dL (ref 0.0–1.2)
CALCIUM: 9.9 mg/dL (ref 8.7–10.3)
CO2: 28 mmol/L (ref 18–29)
CREATININE: 0.81 mg/dL (ref 0.57–1.00)
Chloride: 102 mmol/L (ref 97–108)
GFR calc Af Amer: 90 mL/min/{1.73_m2} (ref >59)
GFR calc non Af Amer: 78 mL/min/{1.73_m2} (ref >59)
GLOBULIN, TOTAL: 1.9 g/dL (ref 1.5–4.5)
Glucose: 87 mg/dL (ref 65–99)
Potassium: 4.8 mmol/L (ref 3.5–5.2)
Sodium: 142 mmol/L (ref 134–144)
Total Protein: 6.7 g/dL (ref 6.0–8.5)

## 2015-02-06 LAB — ANA W/REFLEX IF POSITIVE: ANA: NEGATIVE

## 2015-02-06 LAB — VITAMIN D 25 HYDROXY (VIT D DEFICIENCY, FRACTURES): VIT D 25 HYDROXY: 14.5 ng/mL — AB (ref 30.0–100.0)

## 2015-02-11 ENCOUNTER — Other Ambulatory Visit: Payer: Self-pay | Admitting: *Deleted

## 2015-02-11 ENCOUNTER — Telehealth: Payer: Self-pay | Admitting: *Deleted

## 2015-02-11 DIAGNOSIS — G35 Multiple sclerosis: Secondary | ICD-10-CM

## 2015-02-11 DIAGNOSIS — E559 Vitamin D deficiency, unspecified: Secondary | ICD-10-CM

## 2015-02-11 MED ORDER — VITAMIN D (ERGOCALCIFEROL) 1.25 MG (50000 UNIT) PO CAPS: 50000.0000 [IU] | ORAL_CAPSULE | ORAL | Status: DC

## 2015-02-11 NOTE — Telephone Encounter (Signed)
opened in error/fim

## 2015-02-11 NOTE — Telephone Encounter (Signed)
Spoke with [name] and per RAS, adivsed that vitamin d level is low--advised her of the need for rx. vit. d 50,000iu daily, then 5,000iu daily otc supplement.  She is agreeable with this plan.  Rx. escribed to [facility] on [address] per her request/fim

## 2015-02-11 NOTE — Telephone Encounter (Signed)
error/fim. 

## 2015-02-19 ENCOUNTER — Ambulatory Visit
Admission: RE | Admit: 2015-02-19 | Discharge: 2015-02-19 | Disposition: A | Discharge status: Home / Self Care | Payer: Medicare HMO | Source: Ambulatory Visit | Attending: Neurology | Admitting: Neurology

## 2015-02-19 DIAGNOSIS — G35 Multiple sclerosis: Secondary | ICD-10-CM

## 2015-02-19 DIAGNOSIS — G373 Acute transverse myelitis in demyelinating disease of central nervous system: Secondary | ICD-10-CM

## 2015-02-19 DIAGNOSIS — R1314 Dysphagia, pharyngoesophageal phase: Secondary | ICD-10-CM | POA: Diagnosis not present

## 2015-02-19 DIAGNOSIS — G0489 Other myelitis: Secondary | ICD-10-CM | POA: Diagnosis not present

## 2015-02-19 MED ORDER — GADOBENATE DIMEGLUMINE 529 MG/ML IV SOLN
13.0000 mL | Freq: Once | INTRAVENOUS | Status: AC | PRN
  Administered 2015-02-19: 13 mL via INTRAVENOUS

## 2015-02-25 ENCOUNTER — Encounter: Payer: Self-pay | Admitting: *Deleted

## 2015-05-06 ENCOUNTER — Ambulatory Visit (INDEPENDENT_AMBULATORY_CARE_PROVIDER_SITE_OTHER): Payer: Medicare HMO | Admitting: Neurology

## 2015-05-06 ENCOUNTER — Encounter: Payer: Self-pay | Admitting: Neurology

## 2015-05-06 VITALS — BP 142/82 | HR 64 | Resp 14 | Ht 70.0 in | Wt 146.8 lb

## 2015-05-06 DIAGNOSIS — G0489 Other myelitis: Secondary | ICD-10-CM

## 2015-05-06 DIAGNOSIS — M5481 Occipital neuralgia: Secondary | ICD-10-CM | POA: Diagnosis not present

## 2015-05-06 DIAGNOSIS — G373 Acute transverse myelitis in demyelinating disease of central nervous system: Secondary | ICD-10-CM

## 2015-05-06 DIAGNOSIS — R5383 Other fatigue: Secondary | ICD-10-CM

## 2015-05-06 DIAGNOSIS — R208 Other disturbances of skin sensation: Secondary | ICD-10-CM | POA: Diagnosis not present

## 2015-05-06 DIAGNOSIS — R350 Frequency of micturition: Secondary | ICD-10-CM

## 2015-05-06 DIAGNOSIS — R261 Paralytic gait: Secondary | ICD-10-CM

## 2015-05-06 DIAGNOSIS — M542 Cervicalgia: Secondary | ICD-10-CM | POA: Diagnosis not present

## 2015-05-06 HISTORY — DX: Occipital neuralgia: M54.81

## 2015-05-06 MED ORDER — LAMOTRIGINE 100 MG PO TABS: 100.0000 mg | ORAL_TABLET | Freq: Every day | ORAL | Status: DC

## 2015-05-06 MED ORDER — LAMOTRIGINE 25 MG PO TABS: ORAL_TABLET | ORAL | Status: DC

## 2015-05-06 NOTE — Progress Notes (Signed)
[facility]  PATIENT: [name] DOB: [birth year]  REFERRING CLINICIAN: Crist Infante HISTORY FROM: patient REASON FOR VISIT: transverse myelitis, MS?   HISTORICAL  CHIEF COMPLAINT:  Chief Complaint  Patient presents with  . Transverse Myelitis    Sts. gait is the same--she has not been able to start Ampyra due to cost ($700/month)  She will look into funding for this thru the [employer].  She has new c/o right sided h/a for the last 3-4 weeks.  Sts. onb's have helped in the past.  She also c/o visual disturbance right eye--seeing floaters--she has seen her opthalmologist and was dx. with vittreous gel detachment.  She also c/o increased difficulty swallowing--most often with fluids.[name]  . Vitamin D Deficiency    Sts. her pcp increased vit. d to 50,000iu twice weekly/fim    HISTORY OF PRESENT ILLNESS:  [name] is a 64 yo woman with transverse myelitis, gait disturbance, dysesthesia, urinary issues and right sided headache.     Transverse myelitis/gait/strength/sensation/bladder:   She has left worse than right leg weakness.   She drags her left leg.   She trips easily.    She has had difficulty getting Ampyra due to cost.  We discussed compounded 4-AP.  She used to use a splint (still has).    She feels similar to how she felt at the last visit but her dysesthesias are worse.    This involves both feet and hands with a burning stinging quality.    She stopped Gilenya in 2014 or 2015 due to macular edema.     I personally reviewed her MRI's form the last visit showing the T4 TM plaque, prior cervical fusion and a normal cervical spinal cord and normal brain.   She reports numbness in both hands of the left foot.  This is a burning tingling.    Bladder/bowel:  She has had issues with urinary frequency and incontinence since the early 1990s. She is currently on Myrbetriq with minimal benefit.   She also reports difficulty with urinary hesitancy.  He does not feel that she empties. Additionally, she has had fecal incontinence. This occurs a few times a week and she usually wears depends or other pads.    She sees Dr. Gaynelle Arabian   Right sided headaches:   She has pain in the right side of her head, starting in the occiput and radiating forward.    Pain started mild and built up to be more intense and to associated with visual changes.   Her opthalmologist checked her for macula/retinal issues (she was fine).    She has had ONB's in the past.   She has had most of them by Dr. Isaiah Blakes (Anesthesiology)  Fatigue/sleep/mood:  She has continued fatigue. She has reduced stamina physically and also has cognitive fatigue with difficulty focusing. She sleps well but wakes up x 3-4 for nocturia.   Her family has noticed mild cognitive issues such as reduced memory. She sometimes has difficulty coming up with the right words. She has had difficulty multitasking and completing tasks.   She denies depression or anxiety.   TM/MS History:  In 1989, she had severe numbness in one side of her body.  She had clumsiness and poor gait.  MRI was reportedly normal.     She saw Dr. Erling Cruz who did an LP that showed oligoclonal bands.   She received IV steroids and was told she had MS.   In 1993, she woke up numb from  the waist down in both legs.  This numbness was more intense than the prior episodes and gait was very poor.    She was admitted x 28 days, receiving many days of IV steroids followed by Rehab.     An MRI of the brain was normal but the MRI of the thoracic spine showed a plaque at T4.     She was started on Betaseron and did well in general.  She had some fluctuating symptom but nothing resembling any of the other 3 episodes.   In November 2014 she had additional imaging studies showing a normal MRI of the brain, an abnormal MRI of the thoracic spine with a focus at T4, and a normal cervical spinal cord. She did have evidence of prior fusion surgery in the neck.  There is no myelopathy in the cervical spine. She also underwent a lumbar puncture in November 2014. It was normal and did not show oligoclonal bands or increased IgG index.    She has been treated with Betaseron, Avonex, Tysabri and Gilenya for presumptive MS in the past.   Stopped Tysabri as was JCV Ab positive and stopped Gilenya as she had macula edema.   Tries Aubagio but stopped due to insurance.  No DMT x 2 years now.       REVIEW OF SYSTEMS:  Constitutional: No fevers, chills, sweats, or change in .  She rpeorts fatigue Eyes: No visual changes, double vision, eye pain Ear, nose and throat: No hearing loss, ear pain, nasal congestion, sore throat Cardiovascular: No chest pain, palpitations Respiratory:  No shortness of breath at rest or with exertion.   No wheezes GastrointestinaI: No nausea, vomiting, diarrhea, abdominal pain.  Reports fecal incontinence.   Reports dysphagia Genitourinary:  see above Musculoskeletal:  No neck pain, back pain.   Hasmuscle cramps Integumentary: No rash, pruritus, skin lesions Neurological: as above Psychiatric: No depression at this time.  No anxiety Endocrine: No palpitations, diaphoresis, change in appetite, change in weigh or increased thirst Hematologic/Lymphatic:  No anemia, purpura, petechiae. Allergic/Immunologic: No itchy/runny eyes, nasal congestion, recent allergic reactions, rashes  ALLERGIES: Allergies  Allergen Reactions  . Other Shortness Of Breath    PURPLE LETTUCE  . Betaseron [Interferon Beta-1b] Other (See Comments)    HARD NODULAR AREAS AT INJECTION SITE  . Codeine Nausea And Vomiting  . Gilenya [Fingolimod Hydrochloride] Other (See Comments)    MACULAR EDEMA  . Morphine And Related Other (See Comments)    IV ROUTE - RED STREAKS OF VEIN  . Sumatriptan Nausea Only    INJECTABLE ONLY - RAPID HEART RATE, FLUSHING  . Tysabri [Natalizumab] Other (See Comments)    DEVELOPED JCV ANTIBODY  . Latex Rash    HOME  MEDICATIONS: Outpatient Prescriptions Prior to Visit  Medication Sig Dispense Refill  . albuterol (PROVENTIL HFA;VENTOLIN HFA) 108 (90 BASE) MCG/ACT inhaler Inhale 2 puffs into the lungs 2 (two) times daily.    . ALBUTEROL IN Inhale into the lungs.    . Artificial Tear Ointment (ARTIFICIAL TEARS) ointment as needed.    . budesonide (ENTOCORT EC) 3 MG 24 hr capsule Take 3 mg by mouth 2 (two) times daily.    . budesonide-formoterol (SYMBICORT) 160-4.5 MCG/ACT inhaler Inhale 2 puffs into the lungs.    . calcium carbonate (OS-CAL) 600 MG TABS tablet Take 1,800 mg by mouth.    . Calcium Carbonate-Vit D-Min (CALTRATE 600+D PLUS) 600-400 MG-UNIT per tablet Take 3 tablets by mouth every morning.     Marland Kitchen  dalfampridine 10 MG TB12 Take 10 mg by mouth 2 (two) times daily.    . diphenhydramine-acetaminophen (TYLENOL PM) 25-500 MG TABS Take 1 tablet by mouth at bedtime as needed.    Marland Kitchen HYDROcodone-acetaminophen (NORCO) 5-325 MG per tablet 1-2 tabs po q6 hours prn pain 30 tablet 0  . loratadine (CLARITIN) 10 MG tablet Take 10 mg by mouth daily.    Marland Kitchen loratadine (CLARITIN) 10 MG tablet Take 10 mg by mouth.    . Meclizine HCl 25 MG CHEW Chew by mouth.    . mesalamine (LIALDA) 1.2 G EC tablet Take 1,200 mg by mouth.    . mirabegron ER (MYRBETRIQ) 50 MG TB24 tablet Take 50 mg by mouth daily.    . Multiple Vitamin (MULTIVITAMIN) tablet Take 1 tablet by mouth.    . Multiple Vitamins-Minerals (MULTIVITAMIN PO) Take by mouth. Brand name - Mature Multivitamin from [employer]    . pantoprazole (PROTONIX) 40 MG tablet Take 40 mg by mouth daily.    . Vitamin D, Ergocalciferol, (DRISDOL) 50000 UNITS CAPS capsule Take 1 capsule (50,000 Units total) by mouth every 7 (seven) days. When finished, take an otc daily vitamin d supplement of 5,000iu. 12 capsule 0   No facility-administered medications prior to visit.    PAST MEDICAL HISTORY: Past Medical History  Diagnosis Date  . Cancer   . Osteoporosis   . Multiple  sclerosis   . PONV (postoperative nausea and vomiting)   . Migraines   . COPD (chronic obstructive pulmonary disease)     denies SOB with ADLs  . Arthritis     back  . Sjogren's disease     dryness of eyes, mouth  . GERD (gastroesophageal reflux disease)   . Neurogenic bladder   . Trigger thumb of left hand 12/2014  . Mitral valve prolapse     states no problem  . Heart murmur   . 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 Frequent falls     due to MS  . Dental crowns present     also dental caps  . Family history of adverse reaction to anesthesia     pt's mother had hx. of post-op nausea  . Movement disorder   . Transverse myelitis     PAST SURGICAL HISTORY: Past Surgical History  Procedure Laterality Date  . Tonsillectomy  age 10  . Oophorectomy Bilateral   . Vaginal hysterectomy  age 7  . Cataract extraction Bilateral 2015  . Hemorroidectomy  08/05/2010    2-column hemorrhoidectomy  . Shoulder arthroscopy Bilateral   . Appendectomy  06/22/2011    laparoscopic  . Bunionectomy    . Cervical spine surgery  2002; 2005    x 2 - C3-4; C4-5  . Breast enhancement surgery Bilateral   . Breast implant removal Bilateral   . Incontinence surgery    . Elbow surgery Right   . Intercostal nerve block  2015    occipital  . Trigger finger release Left 01/16/2015    Procedure: LEFT THUMB TRIGGER RELEASE ;  Surgeon: Leanora Cover, MD;  Location: [facility];  Service: Orthopedics;  Laterality: Left;    FAMILY HISTORY: Family History  Problem Relation Age of Onset  . Liver disease Father   . Cirrhosis Father   . Stroke Mother   . Cancer Mother     oropharyngeal  . Heart attack Mother   . Anesthesia problems Mother     post-op nausea  .  Cancer Brother     twin; tonsillar?  . Cancer Sister 59    breast ca, lung ca  . Cancer Other     Nephew; appendiceal carcnoid, melanoma  . Cancer Paternal Grandmother     cervical    SOCIAL HISTORY:  History    Social History  . Marital Status: Married    Spouse Name: Gershon Mussel  . Number of Children: 2  . Years of Education: college   Occupational History  . Retired    Social History Main Topics  . Smoking status: Never Smoker   . Smokeless tobacco: Never Used  . Alcohol Use: Yes     Comment: occasionally  . Drug Use: No  . Sexual Activity: Not on file   Other Topics Concern  . Not on file   Social History Narrative   Pt lives at home with her spouse of 19 years.   Caffeine Use- Drinks caffeine occasionally.     PHYSICAL EXAM  Filed Vitals:   05/06/15 0854  BP: 142/82  Pulse: 64  Resp: 14  Height: 5\' 10"  (1.778 m)  Weight: 146 lb 12.8 oz (66.588 kg)    Body mass index is 21.06 kg/(m^2).   General: The patient is well-developed and well-nourished and in no acute distress  Skin: Extremities are without significant edema.  Neurologic Exam  Mental status: The patient is alert and oriented x 3 at the time of the examination. The patient has apparent normal recent and remote memory, with an apparently normal attention span and concentration ability.   Speech is normal.  Cranial nerves: Extraocular movements are full. Facial symmetry is present. There is good facial sensation to soft touch bilaterally.Facial strength is normal.  Trapezius and sternocleidomastoid strength is normal. No dysarthria is noted.  The tongue is midline, and the patient has symmetric elevation of the soft palate. Hearing mildly better on the right  Motor:  Muscle bulk and tone are normal. Strength is  5 / 5 in the arms and right leg but 4/5 in ankle dorsiflexion 3 to 4-/5 EHL.   Sensory: Sensory testing shows reduced sensation to touch in the left and reduced vibrartion in left hand.    Also reduced sensation to touch in peroneal/L5 distribution of left foot and decreased vibration in lower left leg/foot  Coordination: Cerebellar testing reveals good finger-nose-finger and reduced heel-to-shin, worse on  left.  Gait and station: Station is stable eyes open.  Gait is slightly spastic with a moderate left foot drop.   She can not tandem.   Reflexes: Deep tendon reflexes are symmetric and normal bilaterally.   25 foot timed walk (average of 2 runs) is 9.1 seconds    DIAGNOSTIC DATA (LABS, IMAGING, TESTING) - I reviewed patient records, labs, notes, testing and imaging myself where available.     ASSESSMENT AND PLAN  Transverse myelitis  Spastic gait  Urinary frequency  Other fatigue  Dysesthesia  Neck pain  Bilateral occipital neuralgia     1.   If unable to get Ampyra, consider compounded 4-AP (5 mg po tid) 2.  Lamotrigine titrate to 100 mg po bid for dysesthesia 3.  Remain active 4.  Right occipital nerve block with 80 mg Depo-Medrol in [place]. --   Pain was better after a few minutes RTC 4 months, sooner if problems   Stephie Xu A. Felecia Shelling, MD, PhD 07/21/3663, 4:03 AM Certified in Neurology, Clinical Neurophysiology, Sleep Medicine, Pain Medicine and Neuroimaging  [facility] [address], [address]  [place], [place] [address] [phone]

## 2015-05-08 ENCOUNTER — Telehealth: Payer: Self-pay | Admitting: Neurology

## 2015-05-08 ENCOUNTER — Encounter (HOSPITAL_COMMUNITY): Payer: Self-pay

## 2015-05-08 ENCOUNTER — Ambulatory Visit (HOSPITAL_COMMUNITY)
Admission: RE | Admit: 2015-05-08 | Discharge: 2015-05-08 | Disposition: A | Discharge status: Home / Self Care | Payer: Medicare HMO | Source: Ambulatory Visit | Attending: Internal Medicine | Admitting: Internal Medicine

## 2015-05-08 DIAGNOSIS — Z5181 Encounter for therapeutic drug level monitoring: Secondary | ICD-10-CM | POA: Diagnosis not present

## 2015-05-08 DIAGNOSIS — M810 Age-related osteoporosis without current pathological fracture: Secondary | ICD-10-CM | POA: Diagnosis not present

## 2015-05-08 MED ORDER — 4-AMINOPYRIDINE POWD: 5.0000 mg | Freq: Three times a day (TID) | Status: DC

## 2015-05-08 MED ORDER — DENOSUMAB 60 MG/ML ~~LOC~~ SOLN
60.0000 mg | Freq: Once | SUBCUTANEOUS | Status: AC
  Administered 2015-05-08: 60 mg via SUBCUTANEOUS
  Filled 2015-05-08: qty 1

## 2015-05-08 NOTE — Telephone Encounter (Signed)
Rx. called to [facility] at [facility].  I have also lmom for [name] to let her know this has been done/fim

## 2015-05-08 NOTE — Telephone Encounter (Signed)
noted/fim 

## 2015-05-08 NOTE — Telephone Encounter (Signed)
Misty from [employer] called with a question regarding the patients Rx. She could not tell me the name of the medication. Please call and advise.

## 2015-05-08 NOTE — Telephone Encounter (Signed)
Patient returned call, wanted to let Faith RN know that she received the message and wanted to thank her so much for letting her know, and no worries! No need to return call.

## 2015-05-08 NOTE — Progress Notes (Signed)
Patient in Short Stay at [facility] to receive Prolia injection. Has had this medicine previously at MD office. Tolerated well.

## 2015-05-08 NOTE — Discharge Instructions (Signed)
Prolia Denosumab injection What is this medicine? DENOSUMAB (den oh sue mab) slows bone breakdown. Prolia is used to treat osteoporosis in women after menopause and in men. Delton See is used to prevent bone fractures and other bone problems caused by cancer bone metastases. Delton See is also used to treat giant cell tumor of the bone. This medicine may be used for other purposes; ask your health care provider or pharmacist if you have questions. COMMON BRAND NAME(S): Prolia, XGEVA What should I tell my health care provider before I take this medicine? They need to know if you have any of these conditions: -dental disease -eczema -infection or history of infections -kidney disease or on dialysis -low blood calcium or vitamin D -malabsorption syndrome -scheduled to have surgery or tooth extraction -taking medicine that contains denosumab -thyroid or parathyroid disease -an unusual reaction to denosumab, other medicines, foods, dyes, or preservatives -pregnant or trying to get pregnant -breast-feeding How should I use this medicine? This medicine is for injection under the skin. It is given by a health care professional in a hospital or clinic setting. If you are getting Prolia, a special MedGuide will be given to you by the pharmacist with each prescription and refill. Be sure to read this information carefully each time. For Prolia, talk to your pediatrician regarding the use of this medicine in children. Special care may be needed. For Delton See, talk to your pediatrician regarding the use of this medicine in children. While this drug may be prescribed for children as young as 13 years for selected conditions, precautions do apply. Overdosage: If you think you've taken too much of this medicine contact a poison control center or emergency room at once. Overdosage: If you think you have taken too much of this medicine contact a poison control center or emergency room at once. NOTE: This medicine is only  for you. Do not share this medicine with others. What if I miss a dose? It is important not to miss your dose. Call your doctor or health care professional if you are unable to keep an appointment. What may interact with this medicine? Do not take this medicine with any of the following medications: -other medicines containing denosumab This medicine may also interact with the following medications: -medicines that suppress the immune system -medicines that treat cancer -steroid medicines like prednisone or cortisone This list may not describe all possible interactions. Give your health care provider a list of all the medicines, herbs, non-prescription drugs, or dietary supplements you use. Also tell them if you smoke, drink alcohol, or use illegal drugs. Some items may interact with your medicine. What should I watch for while using this medicine? Visit your doctor or health care professional for regular checks on your progress. Your doctor or health care professional may order blood tests and other tests to see how you are doing. Call your doctor or health care professional if you get a cold or other infection while receiving this medicine. Do not treat yourself. This medicine may decrease your body's ability to fight infection. You should make sure you get enough calcium and vitamin D while you are taking this medicine, unless your doctor tells you not to. Discuss the foods you eat and the vitamins you take with your health care professional. See your dentist regularly. Brush and floss your teeth as directed. Before you have any dental work done, tell your dentist you are receiving this medicine. Do not become pregnant while taking this medicine or for 5 months after  stopping it. Women should inform their doctor if they wish to become pregnant or think they might be pregnant. There is a potential for serious side effects to an unborn child. Talk to your health care professional or pharmacist for  more information. What side effects may I notice from receiving this medicine? Side effects that you should report to your doctor or health care professional as soon as possible: -allergic reactions like skin rash, itching or hives, swelling of the face, lips, or tongue -breathing problems -chest pain -fast, irregular heartbeat -feeling faint or lightheaded, falls -fever, chills, or any other sign of infection -muscle spasms, tightening, or twitches -numbness or tingling -skin blisters or bumps, or is dry, peels, or red -slow healing or unexplained pain in the mouth or jaw -unusual bleeding or bruising Side effects that usually do not require medical attention (Report these to your doctor or health care professional if they continue or are bothersome.): -muscle pain -stomach upset, gas This list may not describe all possible side effects. Call your doctor for medical advice about side effects. You may report side effects to FDA at 1-800-FDA-1088. Where should I keep my medicine? This medicine is only given in a clinic, doctor's office, or other health care setting and will not be stored at home. NOTE: This sheet is a summary. It may not cover all possible information. If you have questions about this medicine, talk to your doctor, pharmacist, or health care provider.  2015, Elsevier/Gold Standard. (2012-06-12 12:37:47)

## 2015-05-22 ENCOUNTER — Ambulatory Visit (HOSPITAL_COMMUNITY): Payer: Medicare HMO

## 2015-09-08 ENCOUNTER — Ambulatory Visit (INDEPENDENT_AMBULATORY_CARE_PROVIDER_SITE_OTHER): Payer: Medicare HMO | Admitting: Neurology

## 2015-09-08 ENCOUNTER — Encounter: Payer: Self-pay | Admitting: Neurology

## 2015-09-08 VITALS — BP 104/78 | HR 70 | Resp 14 | Ht 70.0 in | Wt 146.0 lb

## 2015-09-08 DIAGNOSIS — G35 Multiple sclerosis: Secondary | ICD-10-CM | POA: Diagnosis not present

## 2015-09-08 DIAGNOSIS — G373 Acute transverse myelitis in demyelinating disease of central nervous system: Secondary | ICD-10-CM

## 2015-09-08 DIAGNOSIS — G2581 Restless legs syndrome: Secondary | ICD-10-CM | POA: Diagnosis not present

## 2015-09-08 DIAGNOSIS — R350 Frequency of micturition: Secondary | ICD-10-CM

## 2015-09-08 DIAGNOSIS — R5383 Other fatigue: Secondary | ICD-10-CM

## 2015-09-08 DIAGNOSIS — R261 Paralytic gait: Secondary | ICD-10-CM

## 2015-09-08 DIAGNOSIS — R208 Other disturbances of skin sensation: Secondary | ICD-10-CM | POA: Diagnosis not present

## 2015-09-08 DIAGNOSIS — M5481 Occipital neuralgia: Secondary | ICD-10-CM | POA: Diagnosis not present

## 2015-09-08 DIAGNOSIS — M542 Cervicalgia: Secondary | ICD-10-CM

## 2015-09-08 DIAGNOSIS — G4489 Other headache syndrome: Secondary | ICD-10-CM

## 2015-09-08 DIAGNOSIS — G0489 Other myelitis: Secondary | ICD-10-CM

## 2015-09-08 MED ORDER — LAMOTRIGINE 100 MG PO TABS: 100.0000 mg | ORAL_TABLET | Freq: Two times a day (BID) | ORAL | Status: DC

## 2015-09-08 MED ORDER — ROPINIROLE HCL 0.25 MG PO TABS
ORAL_TABLET | ORAL | Status: DC
Start: 2015-09-08 — End: 2016-09-29

## 2015-09-08 NOTE — Progress Notes (Signed)
[facility]  PATIENT: [name] DOB: 1951-11-18  REFERRING CLINICIAN: Crist Infante HISTORY FROM: patient REASON FOR VISIT: transverse myelitis, MS?   HISTORICAL  CHIEF COMPLAINT:  Chief Complaint  Patient presents with  . Transverse Myelitis    Today [name] has c/o worsening RLS sx. at night only, also sts. hands and feet feel like they are on fire, only at night.  She did start Ampyra and feels walkig is some better./fim  . Headache    She also c/o worsening pain to back of head, requests onb today if appropriate/fim    HISTORY OF PRESENT ILLNESS:  [name] is a 64 yo woman with Multiple Sclerosis.   She has transverse myelitis, gait disturbance, dysesthesia, urinary issues and right sided headache.     Transverse myelitis/gait/strength/sensation/bladder:   She has left worse than right leg weakness.   She drags her left leg.   She trips easily.    She started Ampyra 5 mg po tid (compounded) and tolerates it well.   She feels it is helping her.  .  She does not wear her AFO as it hurts her when it rubs across her skin.     Her dysesthesias are worse.    This involves both feet and hands with a burning stinging quality.    She stopped Gilenya in 2014 or 2015 due to macular edema.  MRI'  show the T4 TM plaque, prior cervical fusion and a normal cervical spinal cord and normal brain.   She reports numbness in both hands of the left foot.  This is a burning tingling.    Bladder/bowel:  She has urinary frequency and incontinence since the early 1990s. She is prescribed on Myrbetriq with minimal benefit.   She also reports difficulty with urinary hesitancy. No recent UTI.     Flomax had not helped in the past.   Additionally, she has had fecal incontinence. This occurs a few times a week and she usually wears depends or other pads.    She sees Dr. Gaynelle Arabian   Right sided headaches:   She has pain in the right side of her head, at the occiput and radiating  forward.   Her right head is numb, too.   When pain is worse, she has  visual changes.   Her opthalmologist found that there was vitreous separation. She has had ONB's in the past.   Results are mixed.  Fatigue/sleep/mood:  She has continued fatigue. She has reduced stamina physically and also has cognitive fatigue with difficulty focusing. She is sleeping worse due to restless legs and PLMS.  Mood/Cognitive:   She denies depression or anxiety.  Her family has noticed mild cognitive issues such as reduced memory. She sometimes has difficulty coming up with the right words. She has had difficulty multitasking and completing tasks.     TM/MS History:  In 1989, she had severe numbness in one side of her body.  She had clumsiness and poor gait.  MRI was reportedly normal.     She saw Dr. Erling Cruz who did an LP that showed oligoclonal bands.   She received IV steroids and was told she had MS.   In 1993, she woke up numb from the waist down in both legs.  This numbness was more intense than the prior episodes and gait was very poor.    She was admitted x 28 days, receiving many days of IV steroids followed by Rehab.     An MRI  of the brain was normal but the MRI of the thoracic spine showed a plaque at T4.     She was started on Betaseron and did well in general.  She had some fluctuating symptom but nothing resembling any of the other 3 episodes.   In November 2014 she had additional imaging studies showing a normal MRI of the brain, an abnormal MRI of the thoracic spine with a focus at T4, and a normal cervical spinal cord. She did have evidence of prior fusion surgery in the neck. There is no myelopathy in the cervical spine. She also underwent a lumbar puncture in November 2014. It was normal and did not show oligoclonal bands or increased IgG index.    She has been treated with Betaseron, Avonex, Tysabri and Gilenya for presumptive MS in the past.   Stopped Tysabri as was JCV Ab positive and stopped Gilenya as she  had macula edema.   Tries Aubagio but stopped due to insurance.  No DMT x 2 years now.       REVIEW OF SYSTEMS:  Constitutional: No fevers, chills, sweats, or change in .  She rpeorts fatigue Eyes: No visual changes, double vision, eye pain Ear, nose and throat: No hearing loss, ear pain, nasal congestion, sore throat Cardiovascular: No chest pain, palpitations Respiratory:  No shortness of breath at rest or with exertion.   No wheezes GastrointestinaI: No nausea, vomiting, diarrhea, abdominal pain.  Reports fecal incontinence.   Reports dysphagia Genitourinary:  see above Musculoskeletal:  No neck pain, back pain.   Hasmuscle cramps Integumentary: No rash, pruritus, skin lesions Neurological: as above Psychiatric: No depression at this time.  No anxiety Endocrine: No palpitations, diaphoresis, change in appetite, change in weigh or increased thirst Hematologic/Lymphatic:  No anemia, purpura, petechiae. Allergic/Immunologic: No itchy/runny eyes, nasal congestion, recent allergic reactions, rashes  ALLERGIES: Allergies  Allergen Reactions  . Other Shortness Of Breath    PURPLE LETTUCE  . Betaseron [Interferon Beta-1b] Other (See Comments)    HARD NODULAR AREAS AT INJECTION SITE  . Codeine Nausea And Vomiting  . Gilenya [Fingolimod Hydrochloride] Other (See Comments)    MACULAR EDEMA  . Morphine And Related Other (See Comments)    IV ROUTE - RED STREAKS OF VEIN  . Sumatriptan Nausea Only    INJECTABLE ONLY - RAPID HEART RATE, FLUSHING  . Tysabri [Natalizumab] Other (See Comments)    DEVELOPED JCV ANTIBODY  . Latex Rash    HOME MEDICATIONS: Outpatient Prescriptions Prior to Visit  Medication Sig Dispense Refill  . albuterol (PROVENTIL HFA;VENTOLIN HFA) 108 (90 BASE) MCG/ACT inhaler Inhale 2 puffs into the lungs 2 (two) times daily.    . Artificial Tear Ointment (ARTIFICIAL TEARS) ointment as needed.    . budesonide-formoterol (SYMBICORT) 160-4.5 MCG/ACT inhaler Inhale 2 puffs  into the lungs.    . calcium carbonate (OS-CAL) 600 MG TABS tablet Take 1,800 mg by mouth.    . dalfampridine 10 MG TB12 Take 10 mg by mouth 2 (two) times daily.    . diphenhydramine-acetaminophen (TYLENOL PM) 25-500 MG TABS Take 1 tablet by mouth at bedtime as needed.    . lamoTRIgine (LAMICTAL) 100 MG tablet Take 1 tablet (100 mg total) by mouth daily. 60 tablet 11  . Multiple Vitamins-Minerals (MULTIVITAMIN PO) Take by mouth. Brand name - Mature Multivitamin from [employer]    . pantoprazole (PROTONIX) 40 MG tablet Take 40 mg by mouth daily.    . Vitamin D, Ergocalciferol, (DRISDOL) 50000 UNITS CAPS capsule  Take 1 capsule (50,000 Units total) by mouth every 7 (seven) days. When finished, take an otc daily vitamin d supplement of 5,000iu. (Patient taking differently: Take 50,000 Units by mouth 2 (two) times a week. When finished, take an otc daily vitamin d supplement of 5,000iu.) 12 capsule 0  . Calcium Carbonate-Vit D-Min (CALTRATE 600+D PLUS) 600-400 MG-UNIT per tablet Take 3 tablets by mouth every morning.     . budesonide (ENTOCORT EC) 3 MG 24 hr capsule Take 3 mg by mouth 2 (two) times daily.    Marland Kitchen loratadine (CLARITIN) 10 MG tablet Take 10 mg by mouth daily.    Marland Kitchen loratadine (CLARITIN) 10 MG tablet Take 10 mg by mouth.    . Meclizine HCl 25 MG CHEW Chew by mouth.    . mesalamine (LIALDA) 1.2 G EC tablet Take 1,200 mg by mouth.    . mirabegron ER (MYRBETRIQ) 50 MG TB24 tablet Take 50 mg by mouth daily.    . ALBUTEROL IN Inhale into the lungs.    . Dalfampridine (4-AMINOPYRIDINE) POWD Take 5 mg by mouth 3 (three) times daily. (Patient not taking: Reported on 09/08/2015) 100 g 0  . HYDROcodone-acetaminophen (NORCO) 5-325 MG per tablet 1-2 tabs po q6 hours prn pain (Patient not taking: Reported on 09/08/2015) 30 tablet 0  . lamoTRIgine (LAMICTAL) 25 MG tablet Take as directed (Patient not taking: Reported on 09/08/2015) 70 tablet 0  . Multiple Vitamin (MULTIVITAMIN) tablet Take 1 tablet by mouth.      No facility-administered medications prior to visit.    PAST MEDICAL HISTORY: Past Medical History  Diagnosis Date  . Cancer   . Osteoporosis   . Multiple sclerosis   . PONV (postoperative nausea and vomiting)   . Migraines   . COPD (chronic obstructive pulmonary disease)     denies SOB with ADLs  . Arthritis     back  . Sjogren's disease     dryness of eyes, mouth  . GERD (gastroesophageal reflux disease)   . Neurogenic bladder   . Trigger thumb of left hand 12/2014  . Mitral valve prolapse     states no problem  . Heart murmur   . 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 Frequent falls     due to MS  . Dental crowns present     also dental caps  . Family history of adverse reaction to anesthesia     pt's mother had hx. of post-op nausea  . Movement disorder   . Transverse myelitis     PAST SURGICAL HISTORY: Past Surgical History  Procedure Laterality Date  . Tonsillectomy  age 64  . Oophorectomy Bilateral   . Vaginal hysterectomy  age 64  . Cataract extraction Bilateral 2015  . Hemorroidectomy  08/05/2010    2-column hemorrhoidectomy  . Shoulder arthroscopy Bilateral   . Appendectomy  06/22/2011    laparoscopic  . Bunionectomy    . Cervical spine surgery  2002; 2005    x 2 - C3-4; C4-5  . Breast enhancement surgery Bilateral   . Breast implant removal Bilateral   . Incontinence surgery    . Elbow surgery Right   . Intercostal nerve block  2015    occipital  . Trigger finger release Left 01/16/2015    Procedure: LEFT THUMB TRIGGER RELEASE ;  Surgeon: Leanora Cover, MD;  Location: [facility];  Service: Orthopedics;  Laterality: Left;    FAMILY HISTORY: Family History  Problem Relation Age of Onset  . Liver disease Father   . Cirrhosis Father   . Stroke Mother   . Cancer Mother     oropharyngeal  . Heart attack Mother   . Anesthesia problems Mother     post-op nausea  . Cancer Brother     twin; tonsillar?  . Cancer Sister  28    breast ca, lung ca  . Cancer Other     Nephew; appendiceal carcnoid, melanoma  . Cancer Paternal Grandmother     cervical    SOCIAL HISTORY:  Social History   Social History  . Marital Status: Married    Spouse Name: Gershon Mussel  . Number of Children: 2  . Years of Education: college   Occupational History  . Retired    Social History Main Topics  . Smoking status: Never Smoker   . Smokeless tobacco: Never Used  . Alcohol Use: Yes     Comment: occasionally  . Drug Use: No  . Sexual Activity: Not on file   Other Topics Concern  . Not on file   Social History Narrative   Pt lives at home with her spouse of 35 years.   Caffeine Use- Drinks caffeine occasionally.     PHYSICAL EXAM  Filed Vitals:   09/08/15 0846  BP: 104/78  Pulse: 70  Resp: 14  Height: 5\' 10"  (1.778 m)  Weight: 146 lb (66.225 kg)    Body mass index is 20.95 kg/(m^2).   General: The patient is well-developed and well-nourished and in no acute distress  Skin: Extremities are without significant edema.  Neurologic Exam  Mental status: The patient is alert and oriented x 3 at the time of the examination. The patient has apparent normal recent and remote memory, with an apparently normal attention span and concentration ability.   Speech is normal.  Cranial nerves: Extraocular movements are full.  There is good facial sensation to soft touch bilaterally.Facial strength is normal.  Trapezius and sternocleidomastoid strength is normal. No dysarthria is noted.  The tongue is midline, and the patient has symmetric elevation of the soft palate. Hearing mildly better on the right  Motor:  Muscle bulk and tone are normal. Strength is  5 / 5 in the arms and right leg but 4/5 in ankle dorsiflexion 3 to 4-/5 EHL.   Sensory: Sensory testing shows reduced sensation to touch in the left and reduced vibrartion in left hand.    Also reduced sensation to touch in peroneal/L5 distribution of left foot and  decreased vibration in lower left leg/foot  Coordination: Cerebellar testing reveals good finger-nose-finger and reduced heel-to-shin, worse on left.  Gait and station: Station is stable eyes open.  Gait is slightly spastic with a moderate left foot drop.   She can not tandem.   Reflexes: Deep tendon reflexes are symmetric and normal bilaterally.   25 foot timed walk (average of 2 runs) is 6.9 seconds    DIAGNOSTIC DATA (LABS, IMAGING, TESTING) - I reviewed patient records, labs, notes, testing and imaging myself where available.     ASSESSMENT AND PLAN  Multiple sclerosis - Plan: Ferritin, CBC with Differential/Platelet  Transverse myelitis  Dysesthesia  Neck pain  Other fatigue  Spastic gait  Urinary frequency  Restless leg - Plan: Ferritin, CBC with Differential/Platelet  Occipital neuralgia of right side    1.   Continue Ampyra (5 mg po tid) 2.  Lamotrigine 100 mg po bid for dysesthesia.  Add ropinirole for RLS/PLMS and  check ferritin 3.  Remain active 4.  Right occipital nerve block with 40 mg Depo-Medrol in [place] using sterile technigue. Right C6, C7 paraspinal and right trapezius muscle trigger point injections using sterile technique --   Pain was better after a few minutes  RTC 4 months, sooner if problems   Elizabeth Paulsen A. Felecia Shelling, MD, PhD 1/82/9937, 1:69 AM Certified in Neurology, Clinical Neurophysiology, Sleep Medicine, Pain Medicine and Neuroimaging  [facility] [address], [place], [place] [address] [phone]

## 2015-09-09 ENCOUNTER — Telehealth: Payer: Self-pay | Admitting: *Deleted

## 2015-09-09 LAB — CBC WITH DIFFERENTIAL/PLATELET
BASOS: 1 %
Basophils Absolute: 0.1 10*3/uL (ref 0.0–0.2)
EOS (ABSOLUTE): 0.1 10*3/uL (ref 0.0–0.4)
EOS: 3 %
HEMOGLOBIN: 13.7 g/dL (ref 11.1–15.9)
Hematocrit: 42.3 % (ref 34.0–46.6)
Immature Grans (Abs): 0 10*3/uL (ref 0.0–0.1)
Immature Granulocytes: 0 %
Lymphocytes Absolute: 1.6 10*3/uL (ref 0.7–3.1)
Lymphs: 31 %
MCH: 30.4 pg (ref 26.6–33.0)
MCHC: 32.4 g/dL (ref 31.5–35.7)
MCV: 94 fL (ref 79–97)
MONOCYTES: 13 %
Monocytes Absolute: 0.7 10*3/uL (ref 0.1–0.9)
NEUTROS PCT: 52 %
Neutrophils Absolute: 2.6 10*3/uL (ref 1.4–7.0)
Platelets: 225 10*3/uL (ref 150–379)
RBC: 4.51 x10E6/uL (ref 3.77–5.28)
RDW: 12.6 % (ref 12.3–15.4)
WBC: 5.1 10*3/uL (ref 3.4–10.8)

## 2015-09-09 LAB — FERRITIN: Ferritin: 242 ng/mL — ABNORMAL HIGH (ref 15–150)

## 2015-09-09 NOTE — Telephone Encounter (Signed)
LMOM (identified vm,) that per RAS, fe levels look good, so rls is probable due to transverse myelitis.  She does not need to return this call unless she has questions/fim

## 2015-09-09 NOTE — Telephone Encounter (Signed)
-----   Message from Britt Bottom, MD sent at 09/09/2015  9:14 AM EDT ----- Please let her know the iron levels look good so her restless leg syndrome is probably more likely due to the transverse myelitis

## 2015-09-10 ENCOUNTER — Other Ambulatory Visit: Payer: Self-pay

## 2015-09-10 MED ORDER — LAMOTRIGINE 100 MG PO TABS: 100.0000 mg | ORAL_TABLET | Freq: Two times a day (BID) | ORAL | Status: DC

## 2015-09-15 ENCOUNTER — Encounter (HOSPITAL_COMMUNITY): Payer: Self-pay | Admitting: Emergency Medicine

## 2015-09-15 ENCOUNTER — Telehealth: Payer: Self-pay | Admitting: Neurology

## 2015-09-15 ENCOUNTER — Emergency Department (HOSPITAL_COMMUNITY)
Admission: EM | Admit: 2015-09-15 | Discharge: 2015-09-15 | Disposition: A | Discharge status: Home / Self Care | Payer: Medicare HMO | Attending: Emergency Medicine | Admitting: Emergency Medicine

## 2015-09-15 ENCOUNTER — Emergency Department (HOSPITAL_COMMUNITY): Payer: Medicare HMO

## 2015-09-15 DIAGNOSIS — G43909 Migraine, unspecified, not intractable, without status migrainosus: Secondary | ICD-10-CM | POA: Diagnosis not present

## 2015-09-15 DIAGNOSIS — R270 Ataxia, unspecified: Secondary | ICD-10-CM | POA: Diagnosis not present

## 2015-09-15 DIAGNOSIS — J449 Chronic obstructive pulmonary disease, unspecified: Secondary | ICD-10-CM | POA: Insufficient documentation

## 2015-09-15 DIAGNOSIS — K219 Gastro-esophageal reflux disease without esophagitis: Secondary | ICD-10-CM | POA: Insufficient documentation

## 2015-09-15 DIAGNOSIS — M1990 Unspecified osteoarthritis, unspecified site: Secondary | ICD-10-CM | POA: Diagnosis not present

## 2015-09-15 DIAGNOSIS — M859 Disorder of bone density and structure, unspecified: Secondary | ICD-10-CM | POA: Insufficient documentation

## 2015-09-15 DIAGNOSIS — R011 Cardiac murmur, unspecified: Secondary | ICD-10-CM | POA: Insufficient documentation

## 2015-09-15 DIAGNOSIS — Z79899 Other long term (current) drug therapy: Secondary | ICD-10-CM | POA: Insufficient documentation

## 2015-09-15 DIAGNOSIS — R269 Unspecified abnormalities of gait and mobility: Secondary | ICD-10-CM | POA: Diagnosis present

## 2015-09-15 DIAGNOSIS — R2681 Unsteadiness on feet: Secondary | ICD-10-CM

## 2015-09-15 DIAGNOSIS — M810 Age-related osteoporosis without current pathological fracture: Secondary | ICD-10-CM | POA: Diagnosis not present

## 2015-09-15 DIAGNOSIS — G35 Multiple sclerosis: Secondary | ICD-10-CM | POA: Diagnosis not present

## 2015-09-15 DIAGNOSIS — Z91040 Latex allergy status: Secondary | ICD-10-CM | POA: Diagnosis not present

## 2015-09-15 LAB — COMPREHENSIVE METABOLIC PANEL
ALBUMIN: 4.3 g/dL (ref 3.5–5.0)
ALK PHOS: 61 U/L (ref 38–126)
ALT: 17 U/L (ref 14–54)
ANION GAP: 7 (ref 5–15)
AST: 18 U/L (ref 15–41)
BUN: 17 mg/dL (ref 6–20)
CALCIUM: 10 mg/dL (ref 8.9–10.3)
CO2: 29 mmol/L (ref 22–32)
Chloride: 106 mmol/L (ref 101–111)
Creatinine, Ser: 1 mg/dL (ref 0.44–1.00)
GFR calc non Af Amer: 59 mL/min — ABNORMAL LOW (ref >60)
GLUCOSE: 92 mg/dL (ref 65–99)
Potassium: 4 mmol/L (ref 3.5–5.1)
Sodium: 142 mmol/L (ref 135–145)
Total Bilirubin: 0.5 mg/dL (ref 0.3–1.2)
Total Protein: 6.6 g/dL (ref 6.5–8.1)

## 2015-09-15 LAB — URINALYSIS, ROUTINE W REFLEX MICROSCOPIC
Bilirubin Urine: NEGATIVE
Glucose, UA: NEGATIVE mg/dL
Hgb urine dipstick: NEGATIVE
Ketones, ur: NEGATIVE mg/dL
Leukocytes, UA: NEGATIVE
NITRITE: NEGATIVE
Protein, ur: NEGATIVE mg/dL
SPECIFIC GRAVITY, URINE: 1.006 (ref 1.005–1.030)
UROBILINOGEN UA: 0.2 mg/dL (ref 0.0–1.0)
pH: 7.5 (ref 5.0–8.0)

## 2015-09-15 LAB — CBC
HCT: 43.2 % (ref 36.0–46.0)
HEMOGLOBIN: 14.3 g/dL (ref 12.0–15.0)
MCH: 30.9 pg (ref 26.0–34.0)
MCHC: 33.1 g/dL (ref 30.0–36.0)
MCV: 93.3 fL (ref 78.0–100.0)
Platelets: 236 10*3/uL (ref 150–400)
RBC: 4.63 MIL/uL (ref 3.87–5.11)
RDW: 12.3 % (ref 11.5–15.5)
WBC: 5.3 10*3/uL (ref 4.0–10.5)

## 2015-09-15 MED ORDER — GADOBENATE DIMEGLUMINE 529 MG/ML IV SOLN
13.0000 mL | Freq: Once | INTRAVENOUS | Status: AC | PRN
  Administered 2015-09-15: 13 mL via INTRAVENOUS

## 2015-09-15 NOTE — ED Notes (Signed)
Pt to MRI at this time.

## 2015-09-15 NOTE — ED Notes (Signed)
Pt up to bathroom attempting to void.

## 2015-09-15 NOTE — Discharge Instructions (Signed)
Follow up with Dr. Felecia Shelling tomorrow morning regarding your medication change.  STOP taking lamictal.  Fall Prevention and Home Safety Falls cause injuries and can affect all age groups. It is possible to use preventive measures to significantly decrease the likelihood of falls. There are many simple measures which can make your home safer and prevent falls. OUTDOORS  Repair cracks and edges of walkways and driveways.  Remove high doorway thresholds.  Trim shrubbery on the main path into your home.  Have good outside lighting.  Clear walkways of tools, rocks, debris, and clutter.  Check that handrails are not broken and are securely fastened. Both sides of steps should have handrails.  Have leaves, snow, and ice cleared regularly.  Use sand or salt on walkways during winter months.  In the garage, clean up grease or oil spills. BATHROOM  Install night lights.  Install grab bars by the toilet and in the tub and shower.  Use non-skid mats or decals in the tub or shower.  Place a plastic non-slip stool in the shower to sit on, if needed.  Keep floors dry and clean up all water on the floor immediately.  Remove soap buildup in the tub or shower on a regular basis.  Secure bath mats with non-slip, double-sided rug tape.  Remove throw rugs and tripping hazards from the floors. BEDROOMS  Install night lights.  Make sure a bedside light is easy to reach.  Do not use oversized bedding.  Keep a telephone by your bedside.  Have a firm chair with side arms to use for getting dressed.  Remove throw rugs and tripping hazards from the floor. KITCHEN  Keep handles on pots and pans turned toward the center of the stove. Use back burners when possible.  Clean up spills quickly and allow time for drying.  Avoid walking on wet floors.  Avoid hot utensils and knives.  Position shelves so they are not too high or low.  Place commonly used objects within easy reach.  If  necessary, use a sturdy step stool with a grab bar when reaching.  Keep electrical cables out of the way.  Do not use floor polish or wax that makes floors slippery. If you must use wax, use non-skid floor wax.  Remove throw rugs and tripping hazards from the floor. STAIRWAYS  Never leave objects on stairs.  Place handrails on both sides of stairways and use them. Fix any loose handrails. Make sure handrails on both sides of the stairways are as long as the stairs.  Check carpeting to make sure it is firmly attached along stairs. Make repairs to worn or loose carpet promptly.  Avoid placing throw rugs at the top or bottom of stairways, or properly secure the rug with carpet tape to prevent slippage. Get rid of throw rugs, if possible.  Have an electrician put in a light switch at the top and bottom of the stairs. OTHER FALL PREVENTION TIPS  Wear low-heel or rubber-soled shoes that are supportive and fit well. Wear closed toe shoes.  When using a stepladder, make sure it is fully opened and both spreaders are firmly locked. Do not climb a closed stepladder.  Add color or contrast paint or tape to grab bars and handrails in your home. Place contrasting color strips on first and last steps.  Learn and use mobility aids as needed. Install an electrical emergency response system.  Turn on lights to avoid dark areas. Replace light bulbs that burn out immediately. Get light  switches that glow.  Arrange furniture to create clear pathways. Keep furniture in the same place.  Firmly attach carpet with non-skid or double-sided tape.  Eliminate uneven floor surfaces.  Select a carpet pattern that does not visually hide the edge of steps.  Be aware of all pets. OTHER HOME SAFETY TIPS  Set the water temperature for 120 F (48.8 C).  Keep emergency numbers on or near the telephone.  Keep smoke detectors on every level of the home and near sleeping areas. Document Released: 12/03/2002  Document Revised: 06/13/2012 Document Reviewed: 03/03/2012 [facility] Patient Information 2015 [place], [place]. This information is not intended to replace advice given to you by your health care provider. Make sure you discuss any questions you have with your health care provider.

## 2015-09-15 NOTE — ED Notes (Signed)
Dr. Doy Mince in to see pt at this time.  Husband remains at bedside

## 2015-09-15 NOTE — ED Provider Notes (Signed)
CSN: [id number]     Arrival date & time 09/15/15  7628 History   First MD Initiated Contact with Patient 09/15/15 1008     Chief Complaint  Patient presents with  . gait problems      (Consider location/radiation/quality/duration/timing/severity/associated sxs/prior Treatment) HPI   [name] is a 64 y.o. female with PMH significant for MS, transverse myelitis, and spastic gait who presents with worsening gait issues.  She reports that her left side has always been weaker than her right.  She normally walks with a cane without difficulty.  She states that starting yesterday AM her balance was off and she was slamming into the wall on her left side.  She states that she feels like a wave is pushing her to the left and she has been unable to walk straight.  Throughout the day it seemed to get better, but it was worse this morning.  Denies hx of vertigo, SOB, CP, V/D, dysphagia, dizziness, HA, numbness, or changes in motor/sensory function.  She is seen by Dr. Felecia Shelling with [facility].  She had an appointment 7 days ago and he changed 2 of her medications.  She had a lot of family in this weekend and was very busy.  Husband states "she over did it".  Denies recent illnesses.   Past Medical History  Diagnosis Date  . Cancer   . Osteoporosis   . Multiple sclerosis   . PONV (postoperative nausea and vomiting)   . Migraines   . COPD (chronic obstructive pulmonary disease)     denies SOB with ADLs  . Arthritis     back  . Sjogren's disease     dryness of eyes, mouth  . GERD (gastroesophageal reflux disease)   . Neurogenic bladder   . Trigger thumb of left hand 12/2014  . Mitral valve prolapse     states no problem  . Heart murmur   . 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 Frequent falls     due to MS  . Dental crowns present     also dental caps  . Family history of adverse reaction to anesthesia     pt's mother had hx. of post-op nausea  . Movement  disorder   . Transverse myelitis    Past Surgical History  Procedure Laterality Date  . Tonsillectomy  age 60  . Oophorectomy Bilateral   . Vaginal hysterectomy  age 52  . Cataract extraction Bilateral 2015  . Hemorroidectomy  08/05/2010    2-column hemorrhoidectomy  . Shoulder arthroscopy Bilateral   . Appendectomy  06/22/2011    laparoscopic  . Bunionectomy    . Cervical spine surgery  2002; 2005    x 2 - C3-4; C4-5  . Breast enhancement surgery Bilateral   . Breast implant removal Bilateral   . Incontinence surgery    . Elbow surgery Right   . Intercostal nerve block  2015    occipital  . Trigger finger release Left 01/16/2015    Procedure: LEFT THUMB TRIGGER RELEASE ;  Surgeon: Leanora Cover, MD;  Location: [place];  Service: Orthopedics;  Laterality: Left;   Family History  Problem Relation Age of Onset  . Liver disease Father   . Cirrhosis Father   . Stroke Mother   . Cancer Mother     oropharyngeal  . Heart attack Mother   . Anesthesia problems Mother     post-op nausea  . Cancer  Brother     twin; tonsillar?  . Cancer Sister 59    breast ca, lung ca  . Cancer Other     Nephew; appendiceal carcnoid, melanoma  . Cancer Paternal Grandmother     cervical   Social History  Substance Use Topics  . Smoking status: Never Smoker   . Smokeless tobacco: Never Used  . Alcohol Use: No   OB History    No data available     Review of Systems All other systems negative unless otherwise stated in HPI    Allergies  Other; Betaseron; Codeine; Gilenya; Morphine and related; Sumatriptan; Tysabri; and Latex  Home Medications   Prior to Admission medications   Medication Sig Start Date End Date Taking? Authorizing Provider  albuterol (PROVENTIL HFA;VENTOLIN HFA) 108 (90 BASE) MCG/ACT inhaler Inhale 2 puffs into the lungs 2 (two) times daily.   Yes Historical Provider, MD  Artificial Tear Ointment (ARTIFICIAL TEARS) ointment Place 1 application into  both eyes as needed (dry eyes). as needed.   Yes Historical Provider, MD  calcium carbonate (OS-CAL) 600 MG TABS tablet Take 1,200 mg by mouth.    Yes Historical Provider, MD  dalfampridine 10 MG TB12 Take 10 mg by mouth 3 (three) times daily.    Yes Historical Provider, MD  diphenhydramine-acetaminophen (TYLENOL PM) 25-500 MG TABS Take 2 tablets by mouth at bedtime as needed (sleep).    Yes Historical Provider, MD  lamoTRIgine (LAMICTAL) 100 MG tablet Take 1 tablet (100 mg total) by mouth 2 (two) times daily. 09/10/15  Yes Britt Bottom, MD  loratadine (CLARITIN) 10 MG tablet Take 10 mg by mouth daily.   Yes Historical Provider, MD  Meclizine HCl 25 MG CHEW Chew 2 tablets by mouth as needed (dizziness).    Yes Historical Provider, MD  Multiple Vitamins-Minerals (MULTIVITAMIN PO) Take by mouth. Brand name - Mature Multivitamin from [employer]   Yes Historical Provider, MD  pantoprazole (PROTONIX) 40 MG tablet Take 40 mg by mouth daily.   Yes Historical Provider, MD  rOPINIRole (REQUIP) 0.25 MG tablet One or two at night 09/08/15  Yes Britt Bottom, MD  Vitamin D, Ergocalciferol, (DRISDOL) 50000 UNITS CAPS capsule Take 1 capsule (50,000 Units total) by mouth every 7 (seven) days. When finished, take an otc daily vitamin d supplement of 5,000iu. Patient taking differently: Take 50,000 Units by mouth 2 (two) times a week. When finished, take an otc daily vitamin d supplement of 5,000iu. 02/11/15  Yes Richard August Saucer, MD   BP 120/80 mmHg  Pulse 76  Temp(Src) 98.2 F (36.8 C) (Oral)  Resp 16  Ht 5\' 10"  (1.778 m)  Wt 129 lb 4 oz (58.627 kg)  BMI 18.55 kg/m2  SpO2 99% Physical Exam  Constitutional: She is oriented to person, place, and time. She appears well-developed and well-nourished.  HENT:  Head: Normocephalic and atraumatic.  Mouth/Throat: Oropharynx is clear and moist.  Eyes: Pupils are equal, round, and reactive to light.  Neck: Normal range of motion. Neck supple.  Cardiovascular:  Normal rate and regular rhythm.   No murmur heard. Pulmonary/Chest: Effort normal and breath sounds normal. No respiratory distress. She has no wheezes. She has no rales.  Abdominal: Soft. Bowel sounds are normal. She exhibits no distension. There is no tenderness.  Musculoskeletal: Normal range of motion.  Lymphadenopathy:    She has no cervical adenopathy.  Neurological: She is alert and oriented to person, place, and time.  Mental Status:   AOx3 Cranial  Nerves:  I-not tested  II-visual acuity grossly intact, PERRLA  III, IV, VI-EOMs intact  V-temporal and masseter strength intact  VII-symmetrical facial movements intact, no facial droop  VIII-hearing grossly intact bilaterally  IX, X-gag intact  XI-strength of sternomastoid and trapezius muscles 5/5  XII-tongue midline Motor:   Good muscle bulk and tone  Strength 5/5 on the right side in upper and lower extremities.  Strength 4/5 on the left side in upper and lower extremities.  Cerebellar--RAMs, finger to nose intact.  She is unable to walk without support.  She drags her left foot.  No pronator drift Sensory:  intact      Skin: Skin is warm and dry.  Psychiatric: She has a normal mood and affect. Her behavior is normal.    ED Course  Procedures (including critical care time) Labs Review Labs Reviewed  COMPREHENSIVE METABOLIC PANEL - Abnormal; Notable for the following:    GFR calc non Af Amer 59 (*)    All other components within normal limits  CBC  URINALYSIS, ROUTINE W REFLEX MICROSCOPIC (NOT AT [facility])    Imaging Review Mr Jeri Cos Wo Contrast  09/15/2015   CLINICAL DATA:  64 year old female with dizziness, unsteady gait for 2 days. Left side weakness. Initial encounter. Current history of multiple sclerosis.  EXAM: MRI HEAD WITHOUT AND WITH CONTRAST  TECHNIQUE: Multiplanar, multiecho pulse sequences of the brain and surrounding structures were obtained without and with intravenous contrast.  CONTRAST:  45mL  MULTIHANCE GADOBENATE DIMEGLUMINE 529 MG/ML IV SOLN  COMPARISON:  Brain MRI 02/19/2015, and earlier  FINDINGS: Stable cerebral volume. Major intracranial vascular flow voids are stable. No restricted diffusion to suggest acute infarction. No midline shift, mass effect, evidence of mass lesion, ventriculomegaly, extra-axial collection or acute intracranial hemorrhage. Cervicomedullary junction and pituitary are within normal limits.  Pearline Cables and white matter signal appears stable and essentially normal for age throughout the brain. No white matter lesions typical of demyelinating disease are identified. No cortical encephalomalacia or chronic cerebral blood products. No abnormal enhancement identified.  Visualized internal auditory structures appear stable and within normal limits. Mastoids are clear. Trace paranasal sinus mucosal thickening is stable. Postoperative changes to both globes re - identified. Negative scalp soft tissues. Postoperative ACDF type changes to the cervical spine are partially visible. Grossly negative visualized cervical spinal cord.  IMPRESSION: Stable and normal MRI appearance of the brain.   Electronically Signed   By: Genevie Ann M.D.   On: 09/15/2015 16:53   I have personally reviewed and evaluated these images and lab results as part of my medical decision-making.   EKG Interpretation None      MDM   Final diagnoses:  Abnormal gait   Patient presents with gait disturbance.  VSS, patient appears nontoxic, NAD.  On exam, decreased strength on the left side vs the right.  Difficulty walking, requires substantial support by her husband.   Suspect gait imbalance due to recent change in medication vs MS flare vs new myelitis.  Low suspicion for stroke, SAH, or infectious etiology.   Imaging not performed.  Labs include UA, CBC, CMP which are all normal .     Will consult neuro hospitalist and Dr. Felecia Shelling.  Per neurology, MR of brain. Dr. Felecia Shelling if the MRIs do not show any acute  findings, she will stop the lamotrigine as it could be causing CNS side effects.  MR of brain is stable and of normal appearance.  Per Dr. Doy Mince, will d/c lamictal and  pt will follow up tomorrow by calling Dr. Felecia Shelling with medication change.   Suspect gait abnormality secondary to recent medication change. Pt stable for d/c.  Advised to follow up tomorrow with Dr. Felecia Shelling.  Discussed return precautions and supportive care.  Patient acknowledges and agrees with the above plan.  Case has been discussed with and seen by Dr. Eulis Foster who agrees with the above plan.      Gloriann Loan, PA-C 09/15/15 1832  Daleen Bo, MD 09/15/15 [phone]

## 2015-09-15 NOTE — ED Notes (Signed)
Neurology at bedside to consult.

## 2015-09-15 NOTE — Telephone Encounter (Signed)
I received a phone call from the neurology service at [facility]. She was complaining of wooziness and veering to the left as she walked.  She will be having imaging studies in the emergency room.  At her last visit I started her on lamotrigine.  If the MRIs do not show any acute findings, she will stop the lamotrigine as it could be causing CNS side effects.

## 2015-09-15 NOTE — ED Notes (Signed)
Patient states for the last 2 days she has been leaning to the left when she tries to walk.   Patient states her baseline is always "off balance and uses a cane all the time".   Patient states she "goes to the left when I try to walk".   Patient denies any additional weakness, nothing more than normal.   Patient states she has been shaking some.   Patient states MS, transverse myelitis.

## 2015-09-15 NOTE — Consult Note (Signed)
NEURO HOSPITALIST CONSULT NOTE   Referring physician: Eulis Foster   Reason for Consult: falling to the left  HPI:                                                                                                                                          [name] is an 64 y.o. female with PMHx of transverse myelitis, gait disturbance, dysesthesia, urinary issues and right sided headache.She recently was seen by Dr. Felecia Shelling of [place]. Per records "She saw Dr. Erling Cruz who did an LP that showed oligoclonal bands. She received IV steroids and was told she had MS. In 1993, she woke up numb from the waist down in both legs. This numbness was more intense than the prior episodes and gait was very poor. She was admitted x 28 days, receiving many days of IV steroids followed by Rehab. An MRI of the brain was normal but the MRI of the thoracic spine showed a plaque at T4. She was started on Betaseron and did well in general. She had some fluctuating symptom but nothing resembling any of the other 3 episodes. In November 2014 she had additional imaging studies showing a normal MRI of the brain, an abnormal MRI of the thoracic spine with a focus at T4, and a normal cervical spinal cord. She did have evidence of prior fusion surgery in the neck. There is no myelopathy in the cervical spine. She also underwent a lumbar puncture in November 2014. It was normal and did not show oligoclonal bands or increased IgG index. She has been treated with Betaseron, Avonex, Tysabri and Gilenya for presumptive MS in the past. Stopped Tysabri as was JCV Ab positive and stopped Gilenya as she had macula edema."  She was last seen by Dr. Felecia Shelling on 09-08-2015.  At that time  She was placed on Lamictal 100 mg BID for dysesthesia   Past Medical History  Diagnosis Date  . Cancer   . Osteoporosis   . Multiple sclerosis   . PONV (postoperative nausea and vomiting)   . Migraines   . COPD (chronic  obstructive pulmonary disease)     denies SOB with ADLs  . Arthritis     back  . Sjogren's disease     dryness of eyes, mouth  . GERD (gastroesophageal reflux disease)   . Neurogenic bladder   . Trigger thumb of left hand 12/2014  . Mitral valve prolapse     states no problem  . Heart murmur   . 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 Frequent falls     due to MS  . Dental crowns present     also dental caps  . Family history of adverse reaction to anesthesia  pt's mother had hx. of post-op nausea  . Movement disorder   . Transverse myelitis     Past Surgical History  Procedure Laterality Date  . Tonsillectomy  age 59  . Oophorectomy Bilateral   . Vaginal hysterectomy  age 14  . Cataract extraction Bilateral 2015  . Hemorroidectomy  08/05/2010    2-column hemorrhoidectomy  . Shoulder arthroscopy Bilateral   . Appendectomy  06/22/2011    laparoscopic  . Bunionectomy    . Cervical spine surgery  2002; 2005    x 2 - C3-4; C4-5  . Breast enhancement surgery Bilateral   . Breast implant removal Bilateral   . Incontinence surgery    . Elbow surgery Right   . Intercostal nerve block  2015    occipital  . Trigger finger release Left 01/16/2015    Procedure: LEFT THUMB TRIGGER RELEASE ;  Surgeon: Leanora Cover, MD;  Location: [place];  Service: Orthopedics;  Laterality: Left;    Family History  Problem Relation Age of Onset  . Liver disease Father   . Cirrhosis Father   . Stroke Mother   . Cancer Mother     oropharyngeal  . Heart attack Mother   . Anesthesia problems Mother     post-op nausea  . Cancer Brother     twin; tonsillar?  . Cancer Sister 80    breast ca, lung ca  . Cancer Other     Nephew; appendiceal carcnoid, melanoma  . Cancer Paternal Grandmother     cervical     Social History:  reports that she has never smoked. She has never used smokeless tobacco. She reports that she does not drink alcohol or use illicit  drugs.  Allergies  Allergen Reactions  . Other Shortness Of Breath    PURPLE LETTUCE  . Betaseron [Interferon Beta-1b] Other (See Comments)    HARD NODULAR AREAS AT INJECTION SITE  . Codeine Nausea And Vomiting  . Gilenya [Fingolimod Hydrochloride] Other (See Comments)    MACULAR EDEMA  . Morphine And Related Other (See Comments)    IV ROUTE - RED STREAKS OF VEIN  . Sumatriptan Nausea Only    INJECTABLE ONLY - RAPID HEART RATE, FLUSHING  . Tysabri [Natalizumab] Other (See Comments)    DEVELOPED JCV ANTIBODY  . Latex Rash    MEDICATIONS:                                                                                                                     No current facility-administered medications for this encounter.   Current Outpatient Prescriptions  Medication Sig Dispense Refill  . albuterol (PROVENTIL HFA;VENTOLIN HFA) 108 (90 BASE) MCG/ACT inhaler Inhale 2 puffs into the lungs 2 (two) times daily.    . Artificial Tear Ointment (ARTIFICIAL TEARS) ointment Place 1 application into both eyes as needed (dry eyes). as needed.    . calcium carbonate (OS-CAL) 600 MG TABS tablet Take 1,200 mg by mouth.     . dalfampridine  10 MG TB12 Take 10 mg by mouth 3 (three) times daily.     . diphenhydramine-acetaminophen (TYLENOL PM) 25-500 MG TABS Take 2 tablets by mouth at bedtime as needed (sleep).     Marland Kitchen lamoTRIgine (LAMICTAL) 100 MG tablet Take 1 tablet (100 mg total) by mouth 2 (two) times daily. 60 tablet 11  . loratadine (CLARITIN) 10 MG tablet Take 10 mg by mouth daily.    . Meclizine HCl 25 MG CHEW Chew 2 tablets by mouth as needed (dizziness).     . Multiple Vitamins-Minerals (MULTIVITAMIN PO) Take by mouth. Brand name - Mature Multivitamin from [employer]    . pantoprazole (PROTONIX) 40 MG tablet Take 40 mg by mouth daily.    Marland Kitchen rOPINIRole (REQUIP) 0.25 MG tablet One or two at night 60 tablet 11  . Vitamin D, Ergocalciferol, (DRISDOL) 50000 UNITS CAPS capsule Take 1 capsule (50,000  Units total) by mouth every 7 (seven) days. When finished, take an otc daily vitamin d supplement of 5,000iu. (Patient taking differently: Take 50,000 Units by mouth 2 (two) times a week. When finished, take an otc daily vitamin d supplement of 5,000iu.) 12 capsule 0  . [DISCONTINUED] estrogens, conjugated, (PREMARIN) 0.3 MG tablet Take 0.3 mg by mouth every other day. Take daily for 21 days then do not take for 7 days.        ROS:                                                                                                                                       History obtained from the patient  General ROS: negative for - chills, fatigue, fever, night sweats, weight gain or weight loss Psychological ROS: negative for - behavioral disorder, hallucinations, memory difficulties, mood swings or suicidal ideation Ophthalmic ROS: negative for - blurry vision, double vision, eye pain or loss of vision ENT ROS: negative for - epistaxis, nasal discharge, oral lesions, sore throat, tinnitus or vertigo Allergy and Immunology ROS: negative for - hives or itchy/watery eyes Hematological and Lymphatic ROS: negative for - bleeding problems, bruising or swollen lymph nodes Endocrine ROS: negative for - galactorrhea, hair pattern changes, polydipsia/polyuria or temperature intolerance Respiratory ROS: negative for - cough, hemoptysis, shortness of breath or wheezing Cardiovascular ROS: negative for - chest pain, dyspnea on exertion, edema or irregular heartbeat Gastrointestinal ROS: negative for - abdominal pain, diarrhea, hematemesis, nausea/vomiting or stool incontinence Genito-Urinary ROS: negative for - dysuria, hematuria, incontinence or urinary frequency/urgency Musculoskeletal ROS: negative for - joint swelling or muscular weakness Neurological ROS: as noted in HPI Dermatological ROS: negative for rash and skin lesion changes   Blood pressure 112/78, pulse 79, temperature 98.2 F (36.8 C),  temperature source Oral, resp. rate 18, height 5\' 10"  (1.778 m), weight 58.627 kg (129 lb 4 oz), SpO2 100 %.   Neurologic Examination:  HEENT-  Normocephalic, no lesions, without obvious abnormality.  Normal external eye and conjunctiva.  Normal TM's bilaterally.  Normal auditory canals and external ears. Normal external nose, mucus membranes and septum.  Normal pharynx. Cardiovascular- S1, S2 normal, pulses palpable throughout   Lungs- chest clear, no wheezing, rales, normal symmetric air entry Abdomen- normal findings: bowel sounds normal Extremities- no edema Lymph-no adenopathy palpable Musculoskeletal-no joint tenderness, deformity or swelling Skin-warm and dry, no hyperpigmentation, vitiligo, or suspicious lesions  Neurological Examination Mental Status: Alert, oriented, thought content appropriate.  Speech fluent without evidence of aphasia.  Able to follow 3 step commands without difficulty. Cranial Nerves: II: Discs flat bilaterally; Visual fields grossly normal, pupils equal, round, reactive to light and accommodation III,IV, VI: ptosis not present, extra-ocular motions intact bilaterally V,VII: smile symmetric, facial light touch sensation normal bilaterally VIII: hearing normal bilaterally IX,X: uvula rises symmetrically XI: bilateral shoulder shrug XII: midline tongue extension Motor: Right : Upper extremity   5/5    Left:     Upper extremity   4/5  Lower extremity   5/5     Lower extremity   4/5 Tone and bulk:normal tone throughout; no atrophy noted Sensory: Pinprick and light touch intact throughout, bilaterally Deep Tendon Reflexes: 2+ and symmetric throughout UE and KJ with no AJ  Plantars: Right: downgoing   Left: downgoing Cerebellar: normal finger-to-nose shows mild dysmetria on the left with jerky movements and normal heel-to-shin test bilaterally with jerky  movements Gait: unsteady on her feet and tends to fall to the left.       Lab Results: Basic Metabolic Panel:  Recent Labs Lab 09/15/15 1006  NA 142  K 4.0  CL 106  CO2 29  GLUCOSE 92  BUN 17  CREATININE 1.00  CALCIUM 10.0    Liver Function Tests:  Recent Labs Lab 09/15/15 1006  AST 18  ALT 17  ALKPHOS 61  BILITOT 0.5  PROT 6.6  ALBUMIN 4.3   No results for input(s): LIPASE, AMYLASE in the last 168 hours. No results for input(s): AMMONIA in the last 168 hours.  CBC:  Recent Labs Lab 09/15/15 1006  WBC 5.3  HGB 14.3  HCT 43.2  MCV 93.3  PLT 236    Cardiac Enzymes: No results for input(s): CKTOTAL, CKMB, CKMBINDEX, TROPONINI in the last 168 hours.  Lipid Panel: No results for input(s): CHOL, TRIG, HDL, CHOLHDL, VLDL, LDLCALC in the last 168 hours.  CBG: No results for input(s): GLUCAP in the last 168 hours.  Microbiology: Results for orders placed or performed during the hospital encounter of 11/08/13  CSF culture     Status: None   Collection Time: 11/08/13  2:00 PM  Result Value Ref Range Status   Gram Stain CYTOSPIN  Final   Gram Stain No WBC Seen  Final   Gram Stain No Organisms Seen  Final   Organism ID, Bacteria NO GROWTH 3 DAYS  Final    Coagulation Studies: No results for input(s): LABPROT, INR in the last 72 hours.  Imaging: No results found.   Etta Quill PA-C [facility] [phone]  09/15/2015, 4:38 PM  Patient seen and examined.  Clinical course and management discussed.  Necessary edits performed.  I agree with the above.  Assessment and plan of care developed and discussed below.    Assessment/Plan: 64 year old with a history of MS and transverse myelitis presenting with gait ataxia.  Although her neurological examination makes a recurrent spinal lesion unlikely can not rule  out the possibility of a MS exacerbation versus acute infarct versus a medication sided effect.  New medications started about a week  ago.    Recommendations: 1.  MRI of the brain with and without contrast 2.  If MRI shows no evidence of am acute ischemic or demyelinating lesion would discontinue Lamictal and have patient follow up with Dr. Felecia Shelling as an outpatient.    Case discussed with Dr. Karn Pickler, MD [facility] [phone]  09/15/2015  4:53 PM

## 2015-09-15 NOTE — Telephone Encounter (Signed)
Dr. Felecia Shelling has spoken with Shanon Brow, [place] in the ER, this afternoon, regarding Dot/fim

## 2015-09-15 NOTE — ED Notes (Addendum)
Pt reports dizziness and balance issues since Sunday evening. Reports L sided weakness and difficulty walking, history of MS. States she does have balance issues per baseline, uses cane at home, but states symptoms have become worse. Denies pain. Pt is alert and oriented x4. Strong and equal bilateral grip strengths. Able to move all extremities, pt leans to L side with walking, requires assistance with ambulation.

## 2015-09-15 NOTE — ED Notes (Signed)
Pt returned from MRI.  Ambulatory to bathroom with assistance.

## 2015-09-15 NOTE — Telephone Encounter (Signed)
noted.  will check with pt. for update this afternoon or tomorrow/fim

## 2015-09-15 NOTE — ED Provider Notes (Signed)
  Face-to-face evaluation   History: She reports acute onset of left-sided balance problems, since yesterday. No headache, fever or current difficulty swallowing. She had some trouble swallowing. Last week. She was started on Lamictal and Requip, last week, by her neurologist. Today, she is unable to walk without support.  And feels like she could not walk even with a walker. She typically uses a cane for intermittent right and left lower extremity weakness.   Physical exam: Alert, calm, cooperative. Left arm and leg, clumsiness, greater than right, with bilateral lower extremity weakness.  Consult neuro hospitalist- 12:10- She will see in ED  Medical screening examination/treatment/procedure(s) were conducted as a shared visit with non-physician practitioner(s) and myself.  I personally evaluated the patient during the encounter  Daleen Bo, MD 09/15/15 2048

## 2015-09-15 NOTE — ED Notes (Signed)
Pt remains in MRI 

## 2015-09-15 NOTE — Telephone Encounter (Signed)
Patient called to advise that yesterday morning she got up to go to bathroom twice, "felt really woozy, fell into everything, somethin is really pulling me to the left, again this morning when she got up to go to the bathroom it was the same thing.It's like she's being slammed into everything, can't get her center of gravity, felt nauseated. At first she thought it was MS but now she's wondering if it is something else like vertigo or possibly stroke". I advised patient to go to the ED. Husband will take her.

## 2015-09-16 ENCOUNTER — Ambulatory Visit (INDEPENDENT_AMBULATORY_CARE_PROVIDER_SITE_OTHER): Payer: Medicare HMO | Admitting: Neurology

## 2015-09-16 ENCOUNTER — Encounter: Payer: Self-pay | Admitting: Neurology

## 2015-09-16 VITALS — BP 124/76 | HR 76 | Resp 16 | Ht 70.0 in | Wt 128.0 lb

## 2015-09-16 DIAGNOSIS — R350 Frequency of micturition: Secondary | ICD-10-CM | POA: Diagnosis not present

## 2015-09-16 DIAGNOSIS — G0489 Other myelitis: Secondary | ICD-10-CM | POA: Diagnosis not present

## 2015-09-16 DIAGNOSIS — G35 Multiple sclerosis: Secondary | ICD-10-CM

## 2015-09-16 DIAGNOSIS — R261 Paralytic gait: Secondary | ICD-10-CM

## 2015-09-16 DIAGNOSIS — G373 Acute transverse myelitis in demyelinating disease of central nervous system: Secondary | ICD-10-CM

## 2015-09-16 MED ORDER — LAMOTRIGINE 100 MG PO TABS: ORAL_TABLET | ORAL | Status: DC

## 2015-09-16 NOTE — Progress Notes (Signed)
[facility]  PATIENT: [name] DOB: November 16, 1951  REFERRING CLINICIAN: Crist Infante HISTORY FROM: patient REASON FOR VISIT: transverse myelitis, MS?   HISTORICAL  CHIEF COMPLAINT:  Chief Complaint  Patient presents with  . Transverse Myelitis    [name] is here today for hospital f/u.  Sts. onset 09-14-15 or much difficulty with balance, leaning to the left.  She was seen at [facility] ER yesterday and ruled out for stroke, sts. sx. possibly due to Lamictal.  Sts. sx. are improved but still present today/fim    HISTORY OF PRESENT ILLNESS:  [name] is a 64 yo woman with Multiple Sclerosis.   She has transverse myelitis, gait disturbance, dysesthesia, urinary issues and right sided headache.     She went to the ER yesterday presenting with poor balance.   Of note, we had started lamotrigine (titration) and ropinirole (night only).    In the past she had been on Neurontin but was switched to Lyrica when her dose became high.  She had weight gain on Lyrica and stopped.   MRI of the brain was almost normal and no new lesions.    Since it was normal, steroid was not infused.    She had bowel incontinence (uncommon for her) today.     Transverse myelitis/gait/strength/sensation/bladder:   She has a history of a T4 transverse myelitis. Since a couple days ago, she is noting more weakness in the legs and worsening gait. She is better today than yesterday.  She drags her left leg.   She trips easily.    She is on  Ampyra 5 mg po tid (compounded) and tolerates it well.   She feels it is helping her.    Dysesthesias improved on lamotrigine.    This involves both feet and hands with a burning stinging quality.     Bladder/bowel:  She has urinary frequency and incontinence since the early 1990s. She is prescribed on Myrbetriq with minimal benefit.   She also reports difficulty with urinary hesitancy but has no recent UTI.     Flomax had not helped.   Additionally, she  has had fecal incontinence. This occurs a few times a week and she usually wears depends or other pads.      Right sided headaches:   Cecilia spinal nerve block at the last visit, she has reported that her headaches are doing much better.    Fatigue/sleep/mood:  She has fatigue and feels it is worse this week. She has reduced stamina physically and also has cognitive fatigue with difficulty focusing. She is sleeping worse due to restless legs and PLMS.  Mood/Cognitive:   She denies depression or anxiety.  Her family has noticed mild cognitive issues such as reduced memory. She sometimes has difficulty coming up with the right words. She has had difficulty multitasking and completing tasks.     TM/MS History:  In 1989, she had severe numbness in one side of her body.  She had clumsiness and poor gait.  MRI was reportedly normal.     She saw Dr. Erling Cruz who did an LP that showed oligoclonal bands.   She received IV steroids and was told she had MS.   In 1993, she woke up numb from the waist down in both legs.  This numbness was more intense than the prior episodes and gait was very poor.    She was admitted x 28 days, receiving many days of IV steroids followed by Rehab.  An MRI of the brain was normal but the MRI of the thoracic spine showed a plaque at T4.     She was started on Betaseron and did well in general.  She had some fluctuating symptom but nothing resembling any of the other 3 episodes.   In November 2014 she had additional imaging studies showing a normal MRI of the brain, an abnormal MRI of the thoracic spine with a focus at T4, and a normal cervical spinal cord. She did have evidence of prior fusion surgery in the neck. There was no myelopathy in the cervical spine. She also underwent a lumbar puncture in November 2014. It was normal and did not show oligoclonal bands or increased IgG index.    She has been treated with Betaseron, Avonex, Tysabri and Gilenya for presumptive MS in the past.    She stopped Tysabri as was JCV Ab positive and stopped Gilenya as she had macula edema.   She tried [place] but stopped due to insurance.  No DMT x 2 years now.       REVIEW OF SYSTEMS:  Constitutional: No fevers, chills, sweats, or change in .  She rpeorts fatigue Eyes: No visual changes, double vision, eye pain Ear, nose and throat: No hearing loss, ear pain, nasal congestion, sore throat Cardiovascular: No chest pain, palpitations Respiratory:  No shortness of breath at rest or with exertion.   No wheezes GastrointestinaI: No nausea, vomiting, diarrhea, abdominal pain.  Reports fecal incontinence.   Reports dysphagia Genitourinary:  see above Musculoskeletal:  No neck pain, back pain.   Hasmuscle cramps Integumentary: No rash, pruritus, skin lesions Neurological: as above Psychiatric: No depression at this time.  No anxiety Endocrine: No palpitations, diaphoresis, change in appetite, change in weigh or increased thirst Hematologic/Lymphatic:  No anemia, purpura, petechiae. Allergic/Immunologic: No itchy/runny eyes, nasal congestion, recent allergic reactions, rashes  ALLERGIES: Allergies  Allergen Reactions  . Other Shortness Of Breath    PURPLE LETTUCE  . Betaseron [Interferon Beta-1b] Other (See Comments)    HARD NODULAR AREAS AT INJECTION SITE  . Codeine Nausea And Vomiting  . Gilenya [Fingolimod Hydrochloride] Other (See Comments)    MACULAR EDEMA  . Morphine And Related Other (See Comments)    IV ROUTE - RED STREAKS OF VEIN  . Sumatriptan Nausea Only    INJECTABLE ONLY - RAPID HEART RATE, FLUSHING  . Tysabri [Natalizumab] Other (See Comments)    DEVELOPED JCV ANTIBODY  . Latex Rash    HOME MEDICATIONS: Outpatient Prescriptions Prior to Visit  Medication Sig Dispense Refill  . albuterol (PROVENTIL HFA;VENTOLIN HFA) 108 (90 BASE) MCG/ACT inhaler Inhale 2 puffs into the lungs 2 (two) times daily.    . Artificial Tear Ointment (ARTIFICIAL TEARS) ointment Place 1  application into both eyes as needed (dry eyes). as needed.    . calcium carbonate (OS-CAL) 600 MG TABS tablet Take 1,200 mg by mouth.     . dalfampridine 10 MG TB12 Take 10 mg by mouth 3 (three) times daily.     . diphenhydramine-acetaminophen (TYLENOL PM) 25-500 MG TABS Take 2 tablets by mouth at bedtime as needed (sleep).     . loratadine (CLARITIN) 10 MG tablet Take 10 mg by mouth daily.    . Meclizine HCl 25 MG CHEW Chew 2 tablets by mouth as needed (dizziness).     . Multiple Vitamins-Minerals (MULTIVITAMIN PO) Take by mouth. Brand name - Mature Multivitamin from [employer]    . pantoprazole (PROTONIX) 40 MG tablet Take  40 mg by mouth daily.    . Vitamin D, Ergocalciferol, (DRISDOL) 50000 UNITS CAPS capsule Take 1 capsule (50,000 Units total) by mouth every 7 (seven) days. When finished, take an otc daily vitamin d supplement of 5,000iu. (Patient taking differently: Take 50,000 Units by mouth 2 (two) times a week. When finished, take an otc daily vitamin d supplement of 5,000iu.) 12 capsule 0  . rOPINIRole (REQUIP) 0.25 MG tablet One or two at night (Patient not taking: Reported on 09/16/2015) 60 tablet 11   No facility-administered medications prior to visit.    PAST MEDICAL HISTORY: Past Medical History  Diagnosis Date  . Cancer   . Osteoporosis   . Multiple sclerosis   . PONV (postoperative nausea and vomiting)   . Migraines   . COPD (chronic obstructive pulmonary disease)     denies SOB with ADLs  . Arthritis     back  . Sjogren's disease     dryness of eyes, mouth  . GERD (gastroesophageal reflux disease)   . Neurogenic bladder   . Trigger thumb of left hand 12/2014  . Mitral valve prolapse     states no problem  . Heart murmur   . 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 Frequent falls     due to MS  . Dental crowns present     also dental caps  . Family history of adverse reaction to anesthesia     pt's mother had hx. of post-op nausea  . Movement  disorder   . Transverse myelitis     PAST SURGICAL HISTORY: Past Surgical History  Procedure Laterality Date  . Tonsillectomy  age 51  . Oophorectomy Bilateral   . Vaginal hysterectomy  age 57  . Cataract extraction Bilateral 2015  . Hemorroidectomy  08/05/2010    2-column hemorrhoidectomy  . Shoulder arthroscopy Bilateral   . Appendectomy  06/22/2011    laparoscopic  . Bunionectomy    . Cervical spine surgery  2002; 2005    x 2 - C3-4; C4-5  . Breast enhancement surgery Bilateral   . Breast implant removal Bilateral   . Incontinence surgery    . Elbow surgery Right   . Intercostal nerve block  2015    occipital  . Trigger finger release Left 01/16/2015    Procedure: LEFT THUMB TRIGGER RELEASE ;  Surgeon: Leanora Cover, MD;  Location: [facility];  Service: Orthopedics;  Laterality: Left;    FAMILY HISTORY: Family History  Problem Relation Age of Onset  . Liver disease Father   . Cirrhosis Father   . Stroke Mother   . Cancer Mother     oropharyngeal  . Heart attack Mother   . Anesthesia problems Mother     post-op nausea  . Cancer Brother     twin; tonsillar?  . Cancer Sister 1    breast ca, lung ca  . Cancer Other     Nephew; appendiceal carcnoid, melanoma  . Cancer Paternal Grandmother     cervical    SOCIAL HISTORY:  Social History   Social History  . Marital Status: Married    Spouse Name: Gershon Mussel  . Number of Children: 2  . Years of Education: college   Occupational History  . Retired    Social History Main Topics  . Smoking status: Never Smoker   . Smokeless tobacco: Never Used  . Alcohol Use: No  . Drug Use: No  . Sexual Activity:  Not on file   Other Topics Concern  . Not on file   Social History Narrative   Pt lives at home with her spouse of 63 years.   Caffeine Use- Drinks caffeine occasionally.     PHYSICAL EXAM  Filed Vitals:   09/16/15 1450  BP: 124/76  Pulse: 76  Resp: 16  Height: 5\' 10"  (1.778 m)  Weight: 128  lb (58.06 kg)    Body mass index is 18.37 kg/(m^2).   General: The patient is well-developed and well-nourished and in no acute distress  Skin: Extremities are without significant edema.  Neurologic Exam  Mental status: The patient is alert and oriented x 3 at the time of the examination. The patient has apparent normal recent and remote memory, with an apparently normal attention span and concentration ability.   Speech is normal.  Cranial nerves: Extraocular movements are full.  There is good facial sensation to soft touch bilaterally.Facial strength is normal.  Trapezius and sternocleidomastoid strength is normal. No dysarthria is noted.  The tongue is midline, and the patient has symmetric elevation of the soft palate. Hearing mildly better on the right  Motor:  Muscle bulk and tone are normal. Strength is  4 / 5 in the left and 5/5 in the right arm.   Leg strength is 4-/5 now and ankle dorsiflexion 3 to 4-/5 EHL.   Sensory: Sensory testing shows reduced sensation to touch in left arm and leg and reduced vibration in left leg.     Coordination: Cerebellar testing reveals reduced finger-nose-finger (worse on left) and she has difficulty doing heel-to-shin, on either side.  Gait and station: Station is stable eyes open.  Gait is spastic and ataxic with a moderate left foot drop.   She can not tandem.  Romberg positive  Reflexes: Deep tendon reflexes are symmetric and normal bilaterally.      DIAGNOSTIC DATA (LABS, IMAGING, TESTING) - I reviewed patient records, labs, notes, testing and imaging myself where available.     ASSESSMENT AND PLAN  Multiple sclerosis  Transverse myelitis  Spastic gait  Urinary frequency    1.   I am concerned that her worsening gait and new onset symptoms in the arms are due to a cervical spine exacerbation. She also has a history of cervical spine fusion. We need to obtain an MRI of the cervical spine to determine if she has had a new  demyelinating event. If this has occurred, we would need to reconsider the addition of therapy. Additionally, we need to rule out a central disc herniation compressing the cord.  2.  Lamotrigine 50 mg by mouth twice a day 7-10 days then increase to 50 mg in the morning and 100 mg at night for dysesthesia.  continue ropinirole for RLS/PLMS 3.  IV Solu-Medrol 1 g today and return tomorrow for a second gram.   RTC 3-4 months, sooner if problems   Richard A. Felecia Shelling, MD, PhD 4/76/5465, 0:35 PM Certified in Neurology, Clinical Neurophysiology, Sleep Medicine, Pain Medicine and Neuroimaging  [facility] [address] [place], [place] [address] [phone]

## 2015-09-23 ENCOUNTER — Ambulatory Visit
Admission: RE | Admit: 2015-09-23 | Discharge: 2015-09-23 | Disposition: A | Discharge status: Home / Self Care | Payer: Medicare HMO | Source: Ambulatory Visit | Attending: Neurology | Admitting: Neurology

## 2015-09-23 DIAGNOSIS — R350 Frequency of micturition: Secondary | ICD-10-CM

## 2015-09-23 DIAGNOSIS — G35 Multiple sclerosis: Secondary | ICD-10-CM

## 2015-09-23 DIAGNOSIS — G373 Acute transverse myelitis in demyelinating disease of central nervous system: Secondary | ICD-10-CM

## 2015-09-23 DIAGNOSIS — R261 Paralytic gait: Secondary | ICD-10-CM

## 2015-09-23 MED ORDER — GADOBENATE DIMEGLUMINE 529 MG/ML IV SOLN
10.0000 mL | Freq: Once | INTRAVENOUS | Status: AC | PRN
  Administered 2015-09-23: 10 mL via INTRAVENOUS

## 2015-09-30 ENCOUNTER — Telehealth: Payer: Self-pay | Admitting: *Deleted

## 2015-09-30 NOTE — Telephone Encounter (Signed)
LMTC./fim 

## 2015-09-30 NOTE — Telephone Encounter (Signed)
-----   Message from Britt Bottom, MD sent at 09/29/2015  4:39 PM EDT ----- Please let her know that the MRI does not show anything new in the spinal cord.

## 2015-09-30 NOTE — Telephone Encounter (Signed)
Patient returned your call. Request that you call her back at your convenience [phone], she has a question regarding your message.

## 2015-09-30 NOTE — Telephone Encounter (Signed)
I have spoken with [name] this afternoon and verified that per RAS, there were no new changes noted in spinal cord.  She sts. she is definitely improving, occasionally still veers to the left, and still feels weak, but definitely is much better./fim

## 2015-09-30 NOTE — Telephone Encounter (Signed)
LMOM (identified vm) that per RAS, mri did not show anything new in spinal cord.  She does not need to return this call unless she has questions/fim

## 2015-10-17 ENCOUNTER — Telehealth: Payer: Self-pay | Admitting: Neurology

## 2015-10-17 NOTE — Telephone Encounter (Signed)
I have spoken with Margareta this morning and advised that Ampyra and dalfampridine are the same/fim

## 2015-10-17 NOTE — Telephone Encounter (Signed)
Pt called and needs to know the compound of the medication of dalfampridine 10 MG TB12. She is currently in a meeting with her insurance and needs to know as soon as possible. Please call [phone]

## 2015-11-12 ENCOUNTER — Ambulatory Visit (HOSPITAL_COMMUNITY): Payer: Medicare HMO

## 2015-11-17 ENCOUNTER — Encounter (HOSPITAL_COMMUNITY): Payer: Self-pay

## 2015-11-17 ENCOUNTER — Ambulatory Visit: Payer: Self-pay | Admitting: Neurology

## 2015-11-17 ENCOUNTER — Telehealth: Payer: Self-pay | Admitting: Neurology

## 2015-11-17 ENCOUNTER — Ambulatory Visit (INDEPENDENT_AMBULATORY_CARE_PROVIDER_SITE_OTHER): Payer: Medicare HMO | Admitting: Neurology

## 2015-11-17 ENCOUNTER — Encounter: Payer: Self-pay | Admitting: Neurology

## 2015-11-17 ENCOUNTER — Ambulatory Visit (HOSPITAL_COMMUNITY)
Admission: RE | Admit: 2015-11-17 | Discharge: 2015-11-17 | Disposition: A | Discharge status: Home / Self Care | Payer: Medicare HMO | Source: Ambulatory Visit | Attending: Internal Medicine | Admitting: Internal Medicine

## 2015-11-17 VITALS — BP 138/84 | HR 70 | Resp 16 | Ht 70.0 in | Wt 147.4 lb

## 2015-11-17 DIAGNOSIS — G373 Acute transverse myelitis in demyelinating disease of central nervous system: Secondary | ICD-10-CM | POA: Diagnosis not present

## 2015-11-17 DIAGNOSIS — G35 Multiple sclerosis: Secondary | ICD-10-CM | POA: Diagnosis not present

## 2015-11-17 DIAGNOSIS — M5481 Occipital neuralgia: Secondary | ICD-10-CM | POA: Diagnosis not present

## 2015-11-17 DIAGNOSIS — R261 Paralytic gait: Secondary | ICD-10-CM | POA: Diagnosis not present

## 2015-11-17 DIAGNOSIS — R208 Other disturbances of skin sensation: Secondary | ICD-10-CM | POA: Diagnosis not present

## 2015-11-17 DIAGNOSIS — M542 Cervicalgia: Secondary | ICD-10-CM | POA: Diagnosis not present

## 2015-11-17 DIAGNOSIS — R5383 Other fatigue: Secondary | ICD-10-CM | POA: Diagnosis not present

## 2015-11-17 DIAGNOSIS — M810 Age-related osteoporosis without current pathological fracture: Secondary | ICD-10-CM | POA: Diagnosis not present

## 2015-11-17 DIAGNOSIS — G4489 Other headache syndrome: Secondary | ICD-10-CM

## 2015-11-17 MED ORDER — DENOSUMAB 60 MG/ML ~~LOC~~ SOLN
60.0000 mg | Freq: Once | SUBCUTANEOUS | Status: AC
  Administered 2015-11-17: 60 mg via SUBCUTANEOUS
  Filled 2015-11-17: qty 1

## 2015-11-17 MED ORDER — LAMOTRIGINE 150 MG PO TABS: ORAL_TABLET | ORAL | Status: DC

## 2015-11-17 NOTE — Telephone Encounter (Signed)
I have spoken with [name] this morning and given appt. for 9am tomorrow am/fim

## 2015-11-17 NOTE — Progress Notes (Signed)
[facility]  PATIENT: [name] DOB: 06/24/1951  REFERRING CLINICIAN: Crist Infante HISTORY FROM: patient REASON FOR VISIT: transverse myelitis, MS?   HISTORICAL  CHIEF COMPLAINT:  Chief Complaint  Patient presents with  . Neck Pain    [name] is here this morning with c/o increased h/a, neck pain, requesting tpi's.  Sts. inj. given at last ov worked very well/fim  . Neck Pain    HISTORY OF PRESENT ILLNESS:  [name] is a 64 yo woman with Multiple Sclerosis.   She has transverse myelitis, gait disturbance, dysesthesia, urinary issues and right sided headache.    MRI of the cervical spine in September 2016 showed stable postoperative ACDF at C4-C5. The spinal cord appeared normal.    She notes she is clumsy, worse than earlier this year.      Right sided headaches and neck pain:   She reports pain in the right neck and occiput that radiates forward to above the right eye at times.   Similar pain was much better x 4-6 weeks after occipital nerve block and TPIs's but returned 3-4 weeks ago.      Transverse myelitis/gait/strength/sensation/bladder:  She feels her gait is worse than earlier in the year and she veers to the left.     She has a history of a T4 transverse myelitis. She drags her left leg.   She trips easily.    She is on  Ampyra 5 mg po tid (compounded) and tolerates it well.   We started lamotrigine and she feels it is helping her burning dysesthesias in both feet and hands.     Bladder/bowel:  She has urinary frequency and incontinence since the early 1990s. She is prescribed on Myrbetriq with minimal benefit.   She also reports difficulty with urinary hesitancy but has no recent UTI.     Flomax had not helped.   Additionally, she has had fecal incontinence. This occurs a few times a week and she usually wears depends or other pads.      Fatigue/sleep/mood:  She has fatigue and feels it is worse with more pain as her sleep is worse.   She  denies depression or anxiety.  Her family has noticed mild cognitive issues such as reduced memory. She sometimes has difficulty coming up with the right words. She has had difficulty multitasking and completing tasks.     TM/MS History:  In 1989, she had severe numbness in one side of her body.  She had clumsiness and poor gait.  MRI was reportedly normal.     She saw Dr. Erling Cruz who did an LP that showed oligoclonal bands.   She received IV steroids and was told she had MS.   In 1993, she woke up numb from the waist down in both legs.  This numbness was more intense than the prior episodes and gait was very poor.    She was admitted x 28 days, receiving many days of IV steroids followed by Rehab.     An MRI of the brain was normal but the MRI of the thoracic spine showed a plaque at T4.     She was started on Betaseron and did well in general.  She had some fluctuating symptom but nothing resembling any of the other 3 episodes.   In November 2014 she had additional imaging studies showing a normal MRI of the brain, an abnormal MRI of the thoracic spine with a focus at T4, and a normal cervical spinal  cord. She did have evidence of prior fusion surgery in the neck. There was no myelopathy in the cervical spine. She also underwent a lumbar puncture in November 2014. It was normal and did not show oligoclonal bands or increased IgG index.    She has been treated with Betaseron, Avonex, Tysabri and Gilenya for presumptive MS in the past.   She stopped Tysabri as was JCV Ab positive and stopped Gilenya as she had macula edema.   She tried [place] but stopped due to insurance.  No DMT x 2 years now.       REVIEW OF SYSTEMS:  Constitutional: No fevers, chills, sweats, or change in .  She rpeorts fatigue Eyes: No visual changes, double vision, eye pain Ear, nose and throat: No hearing loss, ear pain, nasal congestion, sore throat Cardiovascular: No chest pain, palpitations Respiratory:  No shortness of breath at  rest or with exertion.   No wheezes GastrointestinaI: No nausea, vomiting, diarrhea, abdominal pain.  Reports fecal incontinence.   Reports dysphagia Genitourinary:  see above Musculoskeletal:  No neck pain, back pain.   Hasmuscle cramps Integumentary: No rash, pruritus, skin lesions Neurological: as above Psychiatric: No depression at this time.  No anxiety Endocrine: No palpitations, diaphoresis, change in appetite, change in weigh or increased thirst Hematologic/Lymphatic:  No anemia, purpura, petechiae. Allergic/Immunologic: No itchy/runny eyes, nasal congestion, recent allergic reactions, rashes  ALLERGIES: Allergies  Allergen Reactions  . Other Shortness Of Breath    PURPLE LETTUCE  . Betaseron [Interferon Beta-1b] Other (See Comments)    HARD NODULAR AREAS AT INJECTION SITE  . Codeine Nausea And Vomiting  . Gilenya [Fingolimod Hydrochloride] Other (See Comments)    MACULAR EDEMA  . Morphine And Related Other (See Comments)    IV ROUTE - RED STREAKS OF VEIN  . Sumatriptan Nausea Only    INJECTABLE ONLY - RAPID HEART RATE, FLUSHING  . Tysabri [Natalizumab] Other (See Comments)    DEVELOPED JCV ANTIBODY  . Latex Rash    HOME MEDICATIONS: Outpatient Prescriptions Prior to Visit  Medication Sig Dispense Refill  . Artificial Tear Ointment (ARTIFICIAL TEARS) ointment Place 1 application into both eyes as needed (dry eyes). as needed.    . calcium carbonate (OS-CAL) 600 MG TABS tablet Take 1,200 mg by mouth.     . dalfampridine 10 MG TB12 Take 10 mg by mouth 3 (three) times daily.     . diphenhydramine-acetaminophen (TYLENOL PM) 25-500 MG TABS Take 2 tablets by mouth at bedtime as needed (sleep).     Marland Kitchen lamoTRIgine (LAMICTAL) 100 MG tablet 1/2 in am and 1 at night po 45 tablet 11  . loratadine (CLARITIN) 10 MG tablet Take 10 mg by mouth daily.    . Meclizine HCl 25 MG CHEW Chew 2 tablets by mouth as needed (dizziness).     . Multiple Vitamins-Minerals (MULTIVITAMIN PO) Take by  mouth. Brand name - Mature Multivitamin from [employer]    . pantoprazole (PROTONIX) 40 MG tablet Take 40 mg by mouth daily.    Marland Kitchen rOPINIRole (REQUIP) 0.25 MG tablet One or two at night 60 tablet 11  . Vitamin D, Ergocalciferol, (DRISDOL) 50000 UNITS CAPS capsule Take 1 capsule (50,000 Units total) by mouth every 7 (seven) days. When finished, take an otc daily vitamin d supplement of 5,000iu. (Patient taking differently: Take 50,000 Units by mouth 2 (two) times a week. When finished, take an otc daily vitamin d supplement of 5,000iu.) 12 capsule 0  . albuterol (PROVENTIL HFA;VENTOLIN  HFA) 108 (90 BASE) MCG/ACT inhaler Inhale 2 puffs into the lungs 2 (two) times daily.    Marland Kitchen tiZANidine (ZANAFLEX) 4 MG tablet TAKE 1 TABLET BY MOUTH EVERY 6 TO 8 HOURS AS NEEDED FOR SPASMS  0   No facility-administered medications prior to visit.    PAST MEDICAL HISTORY: Past Medical History  Diagnosis Date  . Cancer ([place])   . Osteoporosis   . Multiple sclerosis ([place])   . PONV (postoperative nausea and vomiting)   . Migraines   . COPD (chronic obstructive pulmonary disease) ([place])     denies SOB with ADLs  . Arthritis     back  . Sjogren's disease ([place])     dryness of eyes, mouth  . GERD (gastroesophageal reflux disease)   . Neurogenic bladder   . Trigger thumb of left hand 12/2014  . Mitral valve prolapse     states no problem  . Heart murmur   . 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 Frequent falls     due to MS  . Dental crowns present     also dental caps  . Family history of adverse reaction to anesthesia     pt's mother had hx. of post-op nausea  . Movement disorder   . Transverse myelitis ([place])     PAST SURGICAL HISTORY: Past Surgical History  Procedure Laterality Date  . Tonsillectomy  age 79  . Oophorectomy Bilateral   . Vaginal hysterectomy  age 71  . Cataract extraction Bilateral 2015  . Hemorroidectomy  08/05/2010    2-column hemorrhoidectomy  . Shoulder arthroscopy  Bilateral   . Appendectomy  06/22/2011    laparoscopic  . Bunionectomy    . Cervical spine surgery  2002; 2005    x 2 - C3-4; C4-5  . Breast enhancement surgery Bilateral   . Breast implant removal Bilateral   . Incontinence surgery    . Elbow surgery Right   . Intercostal nerve block  2015    occipital  . Trigger finger release Left 01/16/2015    Procedure: LEFT THUMB TRIGGER RELEASE ;  Surgeon: Leanora Cover, MD;  Location: [facility];  Service: Orthopedics;  Laterality: Left;    FAMILY HISTORY: Family History  Problem Relation Age of Onset  . Liver disease Father   . Cirrhosis Father   . Stroke Mother   . Cancer Mother     oropharyngeal  . Heart attack Mother   . Anesthesia problems Mother     post-op nausea  . Cancer Brother     twin; tonsillar?  . Cancer Sister 3    breast ca, lung ca  . Cancer Other     Nephew; appendiceal carcnoid, melanoma  . Cancer Paternal Grandmother     cervical    SOCIAL HISTORY:  Social History   Social History  . Marital Status: Married    Spouse Name: Gershon Mussel  . Number of Children: 2  . Years of Education: college   Occupational History  . Retired    Social History Main Topics  . Smoking status: Never Smoker   . Smokeless tobacco: Never Used  . Alcohol Use: No  . Drug Use: No  . Sexual Activity: Not on file   Other Topics Concern  . Not on file   Social History Narrative   Pt lives at home with her spouse of 72 years.   Caffeine Use- Drinks caffeine occasionally.     PHYSICAL  EXAM  Filed Vitals:   11/17/15 0947  BP: 138/84  Pulse: 70  Resp: 16  Height: 5\' 10"  (1.778 m)  Weight: 147 lb 6.4 oz (66.86 kg)    Body mass index is 21.15 kg/(m^2).   General: The patient is well-developed and well-nourished and in no acute distress  Skin: Extremities are without significant edema.  Neck:   There is tenderness at the right occiput with radiation forward. Lifting the head reduces the tenderness. She also  has tenderness in the lower cervical paraspinal muscles, trapezius and rhomboids on the right. Right shoulder has good range of motion.  Neurologic Exam  Mental status: The patient is alert and oriented x 3 at the time of the examination. The patient has apparent normal recent and remote memory, with an apparently normal attention span and concentration ability.   Speech is normal.  Cranial nerves: Extraocular movements are full.  There is good facial sensation to soft touch bilaterally.Facial strength is normal.  Trapezius and sternocleidomastoid strength is normal. No dysarthria is noted.  The tongue is midline, and the patient has symmetric elevation of the soft palate. Hearing mildly better on the right  Motor:  Muscle bulk and tone are normal. Strength is  4 / 5 in the left and 5/5 in the right arm.   Leg strength is 4-/5 now and ankle dorsiflexion 3 to 4-/5 EHL.   Sensory: Sensory testing shows reduced sensation to touch in left arm and leg and reduced vibration in left leg.     Coordination: Cerebellar testing reveals reduced finger-nose-finger (worse on left) and she has difficulty doing heel-to-shin, on either side.  Gait and station: Station is stable eyes open.  Gait is spastic and ataxic with a moderate left foot drop.   She can not tandem.  Romberg positive  Reflexes: Deep tendon reflexes are symmetric and normal bilaterally.      DIAGNOSTIC DATA (LABS, IMAGING, TESTING) - I reviewed patient records, labs, notes, testing and imaging myself where available.     ASSESSMENT AND PLAN  MULTIPLE SCLEROSIS  Transverse myelitis (H[facility])  Dysesthesia  Neck pain  Occipital neuralgia of right side  Other fatigue  Spastic gait  Other headache syndrome    1.   Right occipital nerve block with 40 mg Depo-Medrol in [place] using sterile technique. She tolerated the procedure well and there were no complications. 2.   Trigger point injection right C6-C7 paraspinal, right  trapezius and right rhomboid muscles with 40 mg Depo-Medrol in [place]. She tolerated the procedure well and there were no complications 3.  Increase lamotrigine to 150 mg twice a day.   RTC 3-4 months, sooner if problems   Richard A. Felecia Shelling, MD, PhD [id number], [id number] AM Certified in Neurology, Clinical Neurophysiology, Sleep Medicine, Pain Medicine and Neuroimaging  [facility] [address], [place], [place] [address] [phone]

## 2015-11-17 NOTE — Telephone Encounter (Signed)
Pt called requesting rt occipital injections. She is having a lot of pain in head and neck and down neck. Please call and advise

## 2015-11-18 ENCOUNTER — Ambulatory Visit: Payer: Self-pay | Admitting: Neurology

## 2015-12-15 ENCOUNTER — Ambulatory Visit (INDEPENDENT_AMBULATORY_CARE_PROVIDER_SITE_OTHER): Payer: Medicare HMO | Admitting: Neurology

## 2015-12-15 ENCOUNTER — Telehealth: Payer: Self-pay | Admitting: Neurology

## 2015-12-15 ENCOUNTER — Encounter: Payer: Self-pay | Admitting: Neurology

## 2015-12-15 VITALS — BP 128/84 | HR 74 | Resp 16 | Ht 70.0 in | Wt 147.0 lb

## 2015-12-15 DIAGNOSIS — G373 Acute transverse myelitis in demyelinating disease of central nervous system: Secondary | ICD-10-CM

## 2015-12-15 DIAGNOSIS — G4489 Other headache syndrome: Secondary | ICD-10-CM

## 2015-12-15 DIAGNOSIS — R261 Paralytic gait: Secondary | ICD-10-CM | POA: Diagnosis not present

## 2015-12-15 DIAGNOSIS — R350 Frequency of micturition: Secondary | ICD-10-CM | POA: Diagnosis not present

## 2015-12-15 DIAGNOSIS — R208 Other disturbances of skin sensation: Secondary | ICD-10-CM | POA: Diagnosis not present

## 2015-12-15 NOTE — Telephone Encounter (Signed)
I have spoken with [name] this morning and given appt. with RAS this morning--she will be here between 10-1129/fim

## 2015-12-15 NOTE — Telephone Encounter (Signed)
Pt called sts she has been off balance for several days. Yesterday was worse. She said is it not as bad as when she has these symptoms 3 mths ago as she can walk now but she sts she is not stable but she is concerned it could get that bad. Please call and advise

## 2015-12-15 NOTE — Progress Notes (Signed)
[facility]  PATIENT: [name] DOB: 02-15-1951  REFERRING CLINICIAN: Crist Infante HISTORY FROM: patient REASON FOR VISIT: transverse myelitis, MS?   HISTORICAL  CHIEF COMPLAINT:  Chief Complaint  Patient presents with  . Multiple Sclerosis    Sts. increased difficulty with balance, several falls, onset 4-5 days ago./fim    HISTORY OF PRESENT ILLNESS:  [name] is a 64 yo woman with Multiple Sclerosis / transverse myelitis.    This week, she feels weaker and balance is worse.    Her left leg seems weaker and she has fallen x 2.      MRI's earlier this year showed near normal brian and T4 TM, just left of midline.      Transverse myelitis/gait/strength/sensation/bladder:   She has a history of a T4 transverse myelitis. This week, she is noting more weakness in the legs and worsening gait. If she falls, she has trouble getting backup. She drags her left leg.   She trips easily.    She is on  Ampyra 5 mg po tid (compounded) and tolerates it well.   She feels it is helping her.    Dysesthesias (burning in feet and hands) improved on lamotrigine and she will be increasing dose further.     A few months ago, with similar symptoms, she improved after IV Solu-Medrol.       Bladder/bowel:  She has no change in urinary dysfunction with frequency and incontinenceShe also reports difficulty with urinary hesitancy but has no recent UTI.     Flomax had not helped.   Additionally, she has had fecal incontinence. This occurs a few times a week and she usually wears depends or other pads.      Right sided headaches:   She continues to do well since the occipital nerve block last visit.     Fatigue/sleep/mood:  She has fatigue, unchanged this week. She has reduced stamina physically and also has cognitive fatigue with difficulty focusing. She is sleeping poorly due to frequent urination.   RLS/PLMS is not as bad as earlier.  .  Mood/Cognitive:   She denies depression  or anxiety.  . She sometimes has difficulty coming up with the right words. She has had difficulty multitasking and completing tasks.     TM/MS History:  In 1989, she had severe numbness in one side of her body.  She had clumsiness and poor gait.  MRI was reportedly normal.     She saw Dr. Erling Cruz who did an LP that showed oligoclonal bands.   She received IV steroids and was told she had MS.   In 1993, she woke up numb from the waist down in both legs.  This numbness was more intense than the prior episodes and gait was very poor.    She was admitted x 28 days, receiving many days of IV steroids followed by Rehab.     An MRI of the brain was normal but the MRI of the thoracic spine showed a plaque at T4.     She was started on Betaseron and did well in general.  She had some fluctuating symptom but nothing resembling any of the other 3 episodes.   In November 2014 she had additional imaging studies showing a normal MRI of the brain, an abnormal MRI of the thoracic spine with a focus at T4, and a normal cervical spinal cord. She did have evidence of prior fusion surgery in the neck. There was no myelopathy in the  cervical spine. She also underwent a lumbar puncture in November 2014. It was normal and did not show oligoclonal bands or increased IgG index.    She has been treated with Betaseron, Avonex, Tysabri and Gilenya for presumptive MS in the past.   She stopped Tysabri as was JCV Ab positive and stopped Gilenya as she had macula edema.   She tried [place] but stopped due to insurance.  No DMT x 2 years now.       REVIEW OF SYSTEMS:  Constitutional: No fevers, chills, sweats, or change in .  She rpeorts fatigue Eyes: No visual changes, double vision, eye pain Ear, nose and throat: No hearing loss, ear pain, nasal congestion, sore throat Cardiovascular: No chest pain, palpitations Respiratory:  No shortness of breath at rest or with exertion.   No wheezes GastrointestinaI: No nausea, vomiting, diarrhea,  abdominal pain.  Reports fecal incontinence.   Reports dysphagia Genitourinary:  see above Musculoskeletal:  No neck pain, back pain.   Hasmuscle cramps Integumentary: No rash, pruritus, skin lesions Neurological: as above Psychiatric: No depression at this time.  No anxiety Endocrine: No palpitations, diaphoresis, change in appetite, change in weigh or increased thirst Hematologic/Lymphatic:  No anemia, purpura, petechiae. Allergic/Immunologic: No itchy/runny eyes, nasal congestion, recent allergic reactions, rashes  ALLERGIES: Allergies  Allergen Reactions  . Other Shortness Of Breath    PURPLE LETTUCE  . Betaseron [Interferon Beta-1b] Other (See Comments)    HARD NODULAR AREAS AT INJECTION SITE  . Codeine Nausea And Vomiting  . Gilenya [Fingolimod Hydrochloride] Other (See Comments)    MACULAR EDEMA  . Morphine And Related Other (See Comments)    IV ROUTE - RED STREAKS OF VEIN  . Sumatriptan Nausea Only    INJECTABLE ONLY - RAPID HEART RATE, FLUSHING  . Tysabri [Natalizumab] Other (See Comments)    DEVELOPED JCV ANTIBODY  . Latex Rash    HOME MEDICATIONS: Outpatient Prescriptions Prior to Visit  Medication Sig Dispense Refill  . albuterol (PROVENTIL HFA;VENTOLIN HFA) 108 (90 BASE) MCG/ACT inhaler Inhale 2 puffs into the lungs 2 (two) times daily.    . Artificial Tear Ointment (ARTIFICIAL TEARS) ointment Place 1 application into both eyes as needed (dry eyes). as needed.    . calcium carbonate (OS-CAL) 600 MG TABS tablet Take 1,200 mg by mouth.     . dalfampridine 10 MG TB12 Take 5 mg by mouth 3 (three) times daily. Dose change    . diphenhydramine-acetaminophen (TYLENOL PM) 25-500 MG TABS Take 2 tablets by mouth at bedtime as needed (sleep).     Marland Kitchen lamoTRIgine (LAMICTAL) 150 MG tablet One po bid 60 tablet 11  . loratadine (CLARITIN) 10 MG tablet Take 10 mg by mouth daily.    . Meclizine HCl 25 MG CHEW Chew 2 tablets by mouth as needed (dizziness).     . Multiple  Vitamins-Minerals (MULTIVITAMIN PO) Take by mouth. Brand name - Mature Multivitamin from [employer]    . pantoprazole (PROTONIX) 40 MG tablet Take 40 mg by mouth daily.    Marland Kitchen rOPINIRole (REQUIP) 0.25 MG tablet One or two at night 60 tablet 11  . Vitamin D, Ergocalciferol, (DRISDOL) 50000 UNITS CAPS capsule Take 1 capsule (50,000 Units total) by mouth every 7 (seven) days. When finished, take an otc daily vitamin d supplement of 5,000iu. (Patient taking differently: Take 50,000 Units by mouth 2 (two) times a week. When finished, take an otc daily vitamin d supplement of 5,000iu.) 12 capsule 0  . Teriparatide, Recombinant,  600 MCG/2.4ML SOLN Inject 20 mcg into the skin. Reported on 12/15/2015    . tiZANidine (ZANAFLEX) 4 MG tablet Reported on 12/15/2015  0   No facility-administered medications prior to visit.    PAST MEDICAL HISTORY: Past Medical History  Diagnosis Date  . Osteoporosis   . Multiple sclerosis ([place])   . PONV (postoperative nausea and vomiting)   . Migraines   . COPD (chronic obstructive pulmonary disease) ([place])     denies SOB with ADLs  . Arthritis     back  . Sjogren's disease ([place])     dryness of eyes, mouth  . GERD (gastroesophageal reflux disease)   . Neurogenic bladder   . Trigger thumb of left hand 12/2014  . Mitral valve prolapse     states no problem  . Heart murmur   . 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 Frequent falls     due to MS  . Dental crowns present     also dental caps  . Family history of adverse reaction to anesthesia     pt's mother had hx. of post-op nausea  . Movement disorder   . Transverse myelitis ([place])   . Cancer [facility]) 2011    PAST SURGICAL HISTORY: Past Surgical History  Procedure Laterality Date  . Tonsillectomy  age 22  . Oophorectomy Bilateral   . Vaginal hysterectomy  age 55  . Cataract extraction Bilateral 2015  . Hemorroidectomy  08/05/2010    2-column hemorrhoidectomy  . Shoulder arthroscopy Bilateral   .  Appendectomy  06/22/2011    laparoscopic  . Bunionectomy    . Cervical spine surgery  2002; 2005    x 2 - C3-4; C4-5  . Breast enhancement surgery Bilateral   . Breast implant removal Bilateral   . Incontinence surgery    . Elbow surgery Right   . Intercostal nerve block  2015    occipital  . Trigger finger release Left 01/16/2015    Procedure: LEFT THUMB TRIGGER RELEASE ;  Surgeon: Leanora Cover, MD;  Location: [facility];  Service: Orthopedics;  Laterality: Left;    FAMILY HISTORY: Family History  Problem Relation Age of Onset  . Liver disease Father   . Cirrhosis Father   . Stroke Mother   . Cancer Mother     oropharyngeal  . Heart attack Mother   . Anesthesia problems Mother     post-op nausea  . Cancer Brother     twin; tonsillar?  . Cancer Sister 90    breast ca, lung ca  . Cancer Other     Nephew; appendiceal carcnoid, melanoma  . Cancer Paternal Grandmother     cervical    SOCIAL HISTORY:  Social History   Social History  . Marital Status: Married    Spouse Name: Gershon Mussel  . Number of Children: 2  . Years of Education: college   Occupational History  . Retired    Social History Main Topics  . Smoking status: Never Smoker   . Smokeless tobacco: Never Used  . Alcohol Use: No  . Drug Use: No  . Sexual Activity: Not on file   Other Topics Concern  . Not on file   Social History Narrative   Pt lives at home with her spouse of 17 years.   Caffeine Use- Drinks caffeine occasionally.     PHYSICAL EXAM  Filed Vitals:   12/15/15 1117  BP: 128/84  Pulse: 74  Resp: 16  Height: 5\' 10"  (1.778 m)  Weight: 147 lb (66.679 kg)    Body mass index is 21.09 kg/(m^2).   General: The patient is well-developed and well-nourished and in no acute distress  Skin: Extremities are without significant edema.  Neurologic Exam  Mental status: The patient is alert and oriented x 3 at the time of the examination. The patient has apparent normal recent  and remote memory, with an apparently normal attention span and concentration ability.   Speech is normal.  Cranial nerves: Extraocular movements are full.  There is good facial sensation to soft touch bilaterally.Facial strength is normal.  Trapezius and sternocleidomastoid strength is normal. No dysarthria is noted.   Motor:  Muscle bulk and tone are normal. Strength is  4 / 5 in the left and 5/5 in the right arm.   Leg strength is 4+/5 and ankle dorsiflexion 4-/5 EHL, worse on left.   Sensory: Sensory testing shows reduced sensation to touch and vibration in left leg.     Coordination: Cerebellar testing reveals reduced poor heel-to-shin, on either side.  Gait and station: Station is stable eyes open.  Gait is spastic and ataxic with a moderate left > right foot drop.   She can not tandem.  Romberg positive  Reflexes: Deep tendon reflexes are symmetric and normal bilaterally.      DIAGNOSTIC DATA (LABS, IMAGING, TESTING) - I reviewed patient records, labs, notes, testing and imaging myself where available.     ASSESSMENT AND PLAN  Transverse myelitis (HCC)  Spastic gait  Urinary frequency  Dysesthesia  Other headache syndrome    1.     IV Solu-Medrol 1 g today and return tomorrow for a second gram IV 2.     If she does not improve over the next week, consider repeat MRI of the cervical spine and physical therapy. 3.     Continue medications. 4.     RTC 4 months, sooner if problems   Richard A. Felecia Shelling, MD, PhD [id number], [id number] AM Certified in Neurology, Clinical Neurophysiology, Sleep Medicine, Pain Medicine and Neuroimaging  [facility] [address], [place], [place] [address] [phone]

## 2016-01-08 DIAGNOSIS — M25512 Pain in left shoulder: Secondary | ICD-10-CM | POA: Diagnosis not present

## 2016-01-08 DIAGNOSIS — M25562 Pain in left knee: Secondary | ICD-10-CM | POA: Diagnosis not present

## 2016-01-08 DIAGNOSIS — M25552 Pain in left hip: Secondary | ICD-10-CM | POA: Diagnosis not present

## 2016-01-08 DIAGNOSIS — G35 Multiple sclerosis: Secondary | ICD-10-CM | POA: Diagnosis not present

## 2016-01-08 DIAGNOSIS — Z6820 Body mass index (BMI) 20.0-20.9, adult: Secondary | ICD-10-CM | POA: Diagnosis not present

## 2016-01-09 DIAGNOSIS — S8992XA Unspecified injury of left lower leg, initial encounter: Secondary | ICD-10-CM | POA: Diagnosis not present

## 2016-01-09 DIAGNOSIS — S4992XA Unspecified injury of left shoulder and upper arm, initial encounter: Secondary | ICD-10-CM | POA: Diagnosis not present

## 2016-01-09 DIAGNOSIS — S79912A Unspecified injury of left hip, initial encounter: Secondary | ICD-10-CM | POA: Diagnosis not present

## 2016-01-12 ENCOUNTER — Telehealth: Payer: Self-pay | Admitting: Neurology

## 2016-01-12 ENCOUNTER — Ambulatory Visit: Payer: Medicare HMO | Admitting: Neurology

## 2016-01-12 NOTE — Telephone Encounter (Signed)
Patient called regarding Right Occiptal nerve block, states head has been hurting a lot and would like to speak to Faith about this.

## 2016-01-12 NOTE — Telephone Encounter (Signed)
I have spoken with [name] this morning--appt. given for onb tomorrow am/fim

## 2016-01-13 ENCOUNTER — Ambulatory Visit (INDEPENDENT_AMBULATORY_CARE_PROVIDER_SITE_OTHER): Payer: PPO | Admitting: Neurology

## 2016-01-13 ENCOUNTER — Encounter: Payer: Self-pay | Admitting: Neurology

## 2016-01-13 VITALS — BP 120/74 | HR 64 | Resp 16 | Ht 70.0 in | Wt 147.0 lb

## 2016-01-13 DIAGNOSIS — G373 Acute transverse myelitis in demyelinating disease of central nervous system: Secondary | ICD-10-CM | POA: Diagnosis not present

## 2016-01-13 DIAGNOSIS — M542 Cervicalgia: Secondary | ICD-10-CM | POA: Diagnosis not present

## 2016-01-13 DIAGNOSIS — M5481 Occipital neuralgia: Secondary | ICD-10-CM | POA: Diagnosis not present

## 2016-01-13 DIAGNOSIS — R261 Paralytic gait: Secondary | ICD-10-CM

## 2016-01-13 DIAGNOSIS — G4489 Other headache syndrome: Secondary | ICD-10-CM | POA: Diagnosis not present

## 2016-01-13 NOTE — Progress Notes (Signed)
[facility]  PATIENT: [name] DOB: [birth year]  REFERRING CLINICIAN: Crist Infante HISTORY FROM: patient REASON FOR VISIT: transverse myelitis, MS?   HISTORICAL  CHIEF COMPLAINT:  Chief Complaint  Patient presents with  . History of Transverse Myelitis    Sts. she is having more pain/burning back of head over the last 7-10 days.  She requests an onb today if appropriate.  One fall since last ov, but sts. she tripped over threshold--3 weeks ago at home--x-rays show left shoulder separation which she will see ortho for/fim  . Headache    HISTORY OF PRESENT ILLNESS:  [name] is a 65 yo woman with Multiple Sclerosis.   She has transverse myelitis, gait disturbance, dysesthesia, urinary issues and right sided headache.      Right sided headaches and neck pain:   She reports pain in the right neck and occiput that radiates forward to the right vertex.     Similar pain was much better x 7-8 weeks after occipital nerve block and TPIs's but returned last week.   Nothing really intensifies the pain but heat helps a little bit.   Tylenol has not helped.      Transverse myelitis/gait/strength/sensation/bladder:    She has a history of a T4 transverse myelitis.   Gait is about the same but she did have a fall recently (tripped and could not recover) landing on her shoulder (seeing ortho Thursday).   When tired, she drags her left leg and she trips easily.    She is on  Ampyra 5 mg po tid (compounded) and tolerates it well.   Lamotrigine is helping her burning dysesthesias in both feet and hands.     Bladder:  She has urinary frequency and incontinence since the early 1990s. Myrbetriq helped some but other med's did not.   She also reports difficulty with urinary hesitancy but has no recent UTI.     Flomax had not helped.     Fatigue/sleep/mood:  She has more fatigue recently and feels it is because of sore shoulder and headache.  and feels it is worse with more  pain as her sleep is worse.   She denies depression or anxiety.  She sometimes has difficulty coming up with the right words. She has had difficulty multitasking and completing tasks.     TM/MS History:  In 1989, she had severe numbness in one side of her body.  She had clumsiness and poor gait.  MRI was reportedly normal.     She saw Dr. Erling Cruz who did an LP that showed oligoclonal bands.   She received IV steroids and was told she had MS.   In 1993, she woke up numb from the waist down in both legs.  This numbness was more intense than the prior episodes and gait was very poor.    She was admitted x 28 days, receiving many days of IV steroids followed by Rehab.     An MRI of the brain was normal but the MRI of the thoracic spine showed a plaque at T4.     She was started on Betaseron and did well in general.  She had some fluctuating symptom but nothing resembling any of the other 3 episodes.   In November 2014 she had additional imaging studies showing a normal MRI of the brain, an abnormal MRI of the thoracic spine with a focus at T4, and a normal cervical spinal cord. She did have evidence of prior fusion surgery in  the neck.  MRI of the cervical spine in September 2016 showed stable postoperative ACDF at C4-C5. The spinal cord appeared normal.   There was no myelopathy in the cervical spine. She also underwent a lumbar puncture in November 2014. It was normal and did not show oligoclonal bands or increased IgG index.    She has been treated with Betaseron, Avonex, Tysabri and Gilenya for presumptive MS in the past.   She stopped Tysabri as was JCV Ab positive and stopped Gilenya as she had macula edema.   She tried [place] but stopped due to insurance.  No DMT since 2014.       REVIEW OF SYSTEMS:  Constitutional: No fevers, chills, sweats, or change in .  She rpeorts fatigue Eyes: No visual changes, double vision, eye pain Ear, nose and throat: No hearing loss, ear pain, nasal congestion, sore  throat Cardiovascular: No chest pain, palpitations Respiratory:  No shortness of breath at rest or with exertion.   No wheezes GastrointestinaI: No nausea, vomiting, diarrhea, abdominal pain.  Reports fecal incontinence.   Reports dysphagia Genitourinary:  see above Musculoskeletal:  No neck pain, back pain.   Hasmuscle cramps Integumentary: No rash, pruritus, skin lesions Neurological: as above Psychiatric: No depression at this time.  No anxiety Endocrine: No palpitations, diaphoresis, change in appetite, change in weigh or increased thirst Hematologic/Lymphatic:  No anemia, purpura, petechiae. Allergic/Immunologic: No itchy/runny eyes, nasal congestion, recent allergic reactions, rashes  ALLERGIES: Allergies  Allergen Reactions  . Other Shortness Of Breath    PURPLE LETTUCE  . Betaseron [Interferon Beta-1b] Other (See Comments)    HARD NODULAR AREAS AT INJECTION SITE  . Codeine Nausea And Vomiting  . Gilenya [Fingolimod Hydrochloride] Other (See Comments)    MACULAR EDEMA  . Morphine And Related Other (See Comments)    IV ROUTE - RED STREAKS OF VEIN  . Sumatriptan Nausea Only    INJECTABLE ONLY - RAPID HEART RATE, FLUSHING  . Tysabri [Natalizumab] Other (See Comments)    DEVELOPED JCV ANTIBODY  . Latex Rash    HOME MEDICATIONS: Outpatient Prescriptions Prior to Visit  Medication Sig Dispense Refill  . Artificial Tear Ointment (ARTIFICIAL TEARS) ointment Place 1 application into both eyes as needed (dry eyes). as needed.    . calcium carbonate (OS-CAL) 600 MG TABS tablet Take 1,200 mg by mouth.     . dalfampridine 10 MG TB12 Take 5 mg by mouth 3 (three) times daily. Dose change    . diphenhydramine-acetaminophen (TYLENOL PM) 25-500 MG TABS Take 2 tablets by mouth at bedtime as needed (sleep). Reported on 01/13/2016    . lamoTRIgine (LAMICTAL) 150 MG tablet One po bid 60 tablet 11  . loratadine (CLARITIN) 10 MG tablet Take 10 mg by mouth daily.    . Meclizine HCl 25 MG CHEW  Chew 2 tablets by mouth as needed (dizziness).     . Multiple Vitamins-Minerals (MULTIVITAMIN PO) Take by mouth. Brand name - Mature Multivitamin from [employer]    . pantoprazole (PROTONIX) 40 MG tablet Take 40 mg by mouth daily.    . Teriparatide, Recombinant, 600 MCG/2.4ML SOLN Inject 20 mcg into the skin. Reported on 12/15/2015    . Vitamin D, Ergocalciferol, (DRISDOL) 50000 UNITS CAPS capsule Take 1 capsule (50,000 Units total) by mouth every 7 (seven) days. When finished, take an otc daily vitamin d supplement of 5,000iu. (Patient taking differently: Take 50,000 Units by mouth 2 (two) times a week. When finished, take an otc daily vitamin d  supplement of 5,000iu.) 12 capsule 0  . albuterol (PROVENTIL HFA;VENTOLIN HFA) 108 (90 BASE) MCG/ACT inhaler Inhale 2 puffs into the lungs 2 (two) times daily. Reported on 01/13/2016    . rOPINIRole (REQUIP) 0.25 MG tablet One or two at night (Patient not taking: Reported on 01/13/2016) 60 tablet 11  . tiZANidine (ZANAFLEX) 4 MG tablet Reported on 01/13/2016  0   No facility-administered medications prior to visit.    PAST MEDICAL HISTORY: Past Medical History  Diagnosis Date  . Osteoporosis   . Multiple sclerosis ([place])   . PONV (postoperative nausea and vomiting)   . Migraines   . COPD (chronic obstructive pulmonary disease) ([place])     denies SOB with ADLs  . Arthritis     back  . Sjogren's disease ([place])     dryness of eyes, mouth  . GERD (gastroesophageal reflux disease)   . Neurogenic bladder   . Trigger thumb of left hand 12/2014  . Mitral valve prolapse     states no problem  . Heart murmur   . 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 Frequent falls     due to MS  . Dental crowns present     also dental caps  . Family history of adverse reaction to anesthesia     pt's mother had hx. of post-op nausea  . Movement disorder   . Transverse myelitis ([place])   . Cancer [facility]) 2011    PAST SURGICAL HISTORY: Past Surgical History   Procedure Laterality Date  . Tonsillectomy  age 66  . Oophorectomy Bilateral   . Vaginal hysterectomy  age 57  . Cataract extraction Bilateral 2015  . Hemorroidectomy  08/05/2010    2-column hemorrhoidectomy  . Shoulder arthroscopy Bilateral   . Appendectomy  06/22/2011    laparoscopic  . Bunionectomy    . Cervical spine surgery  2002; 2005    x 2 - C3-4; C4-5  . Breast enhancement surgery Bilateral   . Breast implant removal Bilateral   . Incontinence surgery    . Elbow surgery Right   . Intercostal nerve block  2015    occipital  . Trigger finger release Left 01/16/2015    Procedure: LEFT THUMB TRIGGER RELEASE ;  Surgeon: Leanora Cover, MD;  Location: [facility];  Service: Orthopedics;  Laterality: Left;    FAMILY HISTORY: Family History  Problem Relation Age of Onset  . Liver disease Father   . Cirrhosis Father   . Stroke Mother   . Cancer Mother     oropharyngeal  . Heart attack Mother   . Anesthesia problems Mother     post-op nausea  . Cancer Brother     twin; tonsillar?  . Cancer Sister 89    breast ca, lung ca  . Cancer Other     Nephew; appendiceal carcnoid, melanoma  . Cancer Paternal Grandmother     cervical    SOCIAL HISTORY:  Social History   Social History  . Marital Status: Married    Spouse Name: Gershon Mussel  . Number of Children: 2  . Years of Education: college   Occupational History  . Retired    Social History Main Topics  . Smoking status: Never Smoker   . Smokeless tobacco: Never Used  . Alcohol Use: No  . Drug Use: No  . Sexual Activity: Not on file   Other Topics Concern  . Not on file   Social History Narrative  Pt lives at home with her spouse of 37 years.   Caffeine Use- Drinks caffeine occasionally.     PHYSICAL EXAM  Filed Vitals:   01/13/16 0850  BP: 120/74  Pulse: 64  Resp: 16  Height: 5\' 10"  (1.778 m)  Weight: 147 lb (66.679 kg)    Body mass index is 21.09 kg/(m^2).   General: The patient is  well-developed and well-nourished and in no acute distress  Neck:   There is tenderness at the right > left occiput with radiation forward. Lifting the head reduces the tenderness. She also has tenderness in the lower cervical paraspinal muscles, trapezius and rhomboids on the right. Right shoulder has good range of motion.   Very tender over left shoulder with reduced ROM  Neurologic Exam  Mental status: The patient is alert and oriented x 3 at the time of the examination. The patient has apparent normal recent and remote memory, with an apparently normal attention span and concentration ability.   Speech is normal.  Cranial nerves: Extraocular movements are full.  There is good facial sensation to soft touch bilaterally.Facial strength is normal.  Trapezius and sternocleidomastoid strength is normal. No dysarthria is noted.    Motor:  Muscle bulk and tone are normal. Strength is  4+/ 5 in the left and 5/5 in the right arm.   Leg strength is 4-/5 now and ankle dorsiflexion 3 to 4-/5 EHL, slightly worse on left  Sensory: Sensory testing shows reduced sensation to touch in left arm and leg and reduced vibration in left leg.     Coordination: Cerebellar testing reveals reduced left finger-nose-finger and she has difficulty doing heel-to-shin, on either side.  Gait and station: Station is stable eyes open.  Gait is spastic and ataxic with a moderate left foot drop.   She can not tandem.  Romberg positive  Reflexes: Deep tendon reflexes are symmetric and normal bilaterally.      DIAGNOSTIC DATA (LABS, IMAGING, TESTING) - I reviewed patient records, labs, notes, testing and imaging myself where available.     ASSESSMENT AND PLAN  MULTIPLE SCLEROSIS  Transverse myelitis (HCC)  Neck pain  Other headache syndrome  Spastic gait    1.   Right occipital nerve block with 40 mg Depo-Medrol in [place] using sterile technique. She tolerated the procedure well and there were no  complications. 2.   Trigger point injection right C6-C7 and C7T1 paraspinal, right trapezius and right rhomboid muscles with 40 mg Depo-Medrol in [place]. She tolerated the procedure well and there were no complications 3.  Continue lamotrigine 150 mg twice a day.   RTC 4 months, sooner if problems   Kamsiyochukwu Spickler A. Felecia Shelling, MD, PhD [id number], [id number] AM Certified in Neurology, Clinical Neurophysiology, Sleep Medicine, Pain Medicine and Neuroimaging  [facility] [address], [place], [place] [address] [phone]

## 2016-01-15 DIAGNOSIS — M25512 Pain in left shoulder: Secondary | ICD-10-CM | POA: Diagnosis not present

## 2016-01-26 ENCOUNTER — Telehealth: Payer: Self-pay | Admitting: Neurology

## 2016-01-26 NOTE — Telephone Encounter (Signed)
Pt called and says she needs a Rx for new leg braces, whatever the physician feels is necessary. The ones she has now are too old and do not fit. Please call and advise [phone]

## 2016-01-26 NOTE — Telephone Encounter (Signed)
LMTC./fim 

## 2016-01-26 NOTE — Telephone Encounter (Signed)
Patient returned Faith's call °

## 2016-01-27 NOTE — Telephone Encounter (Signed)
I have spoken with [name] this morning--she sts. current afo's are around 10 yrs. old, don't fit well anymore.  I have faxed new order for bilat AFO's to [employer]/fim

## 2016-02-22 DIAGNOSIS — S63502A Unspecified sprain of left wrist, initial encounter: Secondary | ICD-10-CM | POA: Diagnosis not present

## 2016-02-23 ENCOUNTER — Telehealth: Payer: Self-pay | Admitting: Neurology

## 2016-02-23 NOTE — Telephone Encounter (Signed)
Patient is calling back and states that [employer] sent forms to Dr. Felecia Shelling to sign and get back to them so they can send to the insurance company.  Thanks!

## 2016-02-23 NOTE — Telephone Encounter (Signed)
Noted.  Will address forms once I receive them/fim

## 2016-03-01 DIAGNOSIS — M5412 Radiculopathy, cervical region: Secondary | ICD-10-CM | POA: Diagnosis not present

## 2016-03-01 DIAGNOSIS — M25512 Pain in left shoulder: Secondary | ICD-10-CM | POA: Diagnosis not present

## 2016-03-08 DIAGNOSIS — M5412 Radiculopathy, cervical region: Secondary | ICD-10-CM | POA: Diagnosis not present

## 2016-03-08 DIAGNOSIS — M25512 Pain in left shoulder: Secondary | ICD-10-CM | POA: Diagnosis not present

## 2016-03-11 DIAGNOSIS — M542 Cervicalgia: Secondary | ICD-10-CM | POA: Diagnosis not present

## 2016-03-15 DIAGNOSIS — M542 Cervicalgia: Secondary | ICD-10-CM | POA: Diagnosis not present

## 2016-03-15 DIAGNOSIS — M25512 Pain in left shoulder: Secondary | ICD-10-CM | POA: Diagnosis not present

## 2016-03-16 ENCOUNTER — Ambulatory Visit: Payer: Medicare HMO | Admitting: Neurology

## 2016-03-19 DIAGNOSIS — G35 Multiple sclerosis: Secondary | ICD-10-CM | POA: Diagnosis not present

## 2016-03-22 DIAGNOSIS — M5412 Radiculopathy, cervical region: Secondary | ICD-10-CM | POA: Diagnosis not present

## 2016-03-22 DIAGNOSIS — M542 Cervicalgia: Secondary | ICD-10-CM | POA: Diagnosis not present

## 2016-04-13 DIAGNOSIS — E559 Vitamin D deficiency, unspecified: Secondary | ICD-10-CM | POA: Diagnosis not present

## 2016-04-13 DIAGNOSIS — N390 Urinary tract infection, site not specified: Secondary | ICD-10-CM | POA: Diagnosis not present

## 2016-04-13 DIAGNOSIS — M810 Age-related osteoporosis without current pathological fracture: Secondary | ICD-10-CM | POA: Diagnosis not present

## 2016-04-13 DIAGNOSIS — R8299 Other abnormal findings in urine: Secondary | ICD-10-CM | POA: Diagnosis not present

## 2016-04-13 DIAGNOSIS — K529 Noninfective gastroenteritis and colitis, unspecified: Secondary | ICD-10-CM | POA: Diagnosis not present

## 2016-04-13 DIAGNOSIS — Z0000 Encounter for general adult medical examination without abnormal findings: Secondary | ICD-10-CM | POA: Diagnosis not present

## 2016-04-14 ENCOUNTER — Ambulatory Visit: Payer: Medicare HMO | Admitting: Neurology

## 2016-04-19 DIAGNOSIS — G373 Acute transverse myelitis in demyelinating disease of central nervous system: Secondary | ICD-10-CM | POA: Diagnosis not present

## 2016-04-19 DIAGNOSIS — M25512 Pain in left shoulder: Secondary | ICD-10-CM | POA: Diagnosis not present

## 2016-04-19 DIAGNOSIS — E559 Vitamin D deficiency, unspecified: Secondary | ICD-10-CM | POA: Diagnosis not present

## 2016-04-19 DIAGNOSIS — M3500 Sicca syndrome, unspecified: Secondary | ICD-10-CM | POA: Diagnosis not present

## 2016-04-19 DIAGNOSIS — M25562 Pain in left knee: Secondary | ICD-10-CM | POA: Diagnosis not present

## 2016-04-19 DIAGNOSIS — Z681 Body mass index (BMI) 19 or less, adult: Secondary | ICD-10-CM | POA: Diagnosis not present

## 2016-04-19 DIAGNOSIS — Z0000 Encounter for general adult medical examination without abnormal findings: Secondary | ICD-10-CM | POA: Diagnosis not present

## 2016-04-19 DIAGNOSIS — Z1389 Encounter for screening for other disorder: Secondary | ICD-10-CM | POA: Diagnosis not present

## 2016-04-19 DIAGNOSIS — G35 Multiple sclerosis: Secondary | ICD-10-CM | POA: Diagnosis not present

## 2016-04-19 DIAGNOSIS — M25552 Pain in left hip: Secondary | ICD-10-CM | POA: Diagnosis not present

## 2016-04-19 DIAGNOSIS — J45998 Other asthma: Secondary | ICD-10-CM | POA: Diagnosis not present

## 2016-04-19 DIAGNOSIS — Z23 Encounter for immunization: Secondary | ICD-10-CM | POA: Diagnosis not present

## 2016-04-23 ENCOUNTER — Other Ambulatory Visit (HOSPITAL_COMMUNITY): Payer: Self-pay | Admitting: *Deleted

## 2016-04-23 DIAGNOSIS — L814 Other melanin hyperpigmentation: Secondary | ICD-10-CM | POA: Diagnosis not present

## 2016-04-23 DIAGNOSIS — B078 Other viral warts: Secondary | ICD-10-CM | POA: Diagnosis not present

## 2016-04-23 DIAGNOSIS — Z85828 Personal history of other malignant neoplasm of skin: Secondary | ICD-10-CM | POA: Diagnosis not present

## 2016-04-26 ENCOUNTER — Ambulatory Visit (HOSPITAL_COMMUNITY)
Admission: RE | Admit: 2016-04-26 | Discharge: 2016-04-26 | Disposition: A | Discharge status: Home / Self Care | Payer: PPO | Source: Ambulatory Visit | Attending: Internal Medicine | Admitting: Internal Medicine

## 2016-04-26 DIAGNOSIS — M810 Age-related osteoporosis without current pathological fracture: Secondary | ICD-10-CM | POA: Diagnosis not present

## 2016-04-26 MED ORDER — DENOSUMAB 60 MG/ML ~~LOC~~ SOLN
60.0000 mg | Freq: Once | SUBCUTANEOUS | Status: AC
  Administered 2016-04-26: 60 mg via SUBCUTANEOUS
  Filled 2016-04-26: qty 1

## 2016-04-30 ENCOUNTER — Institutional Professional Consult (permissible substitution): Payer: PPO | Admitting: Pulmonary Disease

## 2016-05-03 ENCOUNTER — Ambulatory Visit (INDEPENDENT_AMBULATORY_CARE_PROVIDER_SITE_OTHER): Payer: PPO | Admitting: Pulmonary Disease

## 2016-05-03 ENCOUNTER — Encounter: Payer: Self-pay | Admitting: Pulmonary Disease

## 2016-05-03 VITALS — BP 130/70 | HR 97 | Ht 70.0 in | Wt 141.2 lb

## 2016-05-03 DIAGNOSIS — K219 Gastro-esophageal reflux disease without esophagitis: Secondary | ICD-10-CM | POA: Diagnosis not present

## 2016-05-03 DIAGNOSIS — M3500 Sicca syndrome, unspecified: Secondary | ICD-10-CM | POA: Diagnosis not present

## 2016-05-03 DIAGNOSIS — R059 Cough, unspecified: Secondary | ICD-10-CM

## 2016-05-03 DIAGNOSIS — R05 Cough: Secondary | ICD-10-CM | POA: Diagnosis not present

## 2016-05-03 MED ORDER — ALBUTEROL SULFATE HFA 108 (90 BASE) MCG/ACT IN AERS: 2.0000 | INHALATION_SPRAY | RESPIRATORY_TRACT | Status: DC | PRN

## 2016-05-03 NOTE — Patient Instructions (Signed)
   Please call me if you have any worsening or cough or new breathing problems before your next appointment  We will review your test results at your next appointment  I will see you back in 3-4 weeks  TESTS ORDERED: 1. CT chest without contrast 2. Esophagogram 3. Full pulmonary function testing on or before next appointment 4. Blood work today

## 2016-05-03 NOTE — Progress Notes (Signed)
Subjective:    Patient ID: [name], female    DOB: Jan 02, 1951, 65 y.o.   MRN: [id number]  HPI She reports she started with a cough 2.5 months ago. She reports at the time she had sinus congestion & post nasal drainage with her eyes watering. She reports she had no fever, chills, or sweats at the time. She reports her sinus congestion has resolved but her cough has progressed. Her cough produces a "thick, ropy" mucus. She reports that cough drops and medicines don't seem to help her cough significantly. She has had post-tussive emesis at times as well. She reports she will wake up coughing at times. She does hear wheezing with her cough. She reports dyspnea that seems to be worsening slightly. She reports talking and singing seem to trigger her cough. She reports dyspnea and coughing with bending over. She has been started on Protonix with Prilosec for breakthrough reflux. She reports previously she had a brash water taste that has improved. Denies any dyspepsia. She reports she will cough with water at times. Denies any dysphagia or odynophagia. She has had problems with aspiration with liquids in the past. She has been on Symbicort without any relief.   Review of Systems No chest pain, pressure, or tightness. No rashes but does bruise easily. A pertinent 14 point review of systems is negative except as per the history of presenting illness.  Allergies  Allergen Reactions  . Other Shortness Of Breath    PURPLE LETTUCE  . Betaseron [Interferon Beta-1b] Other (See Comments)    HARD NODULAR AREAS AT INJECTION SITE  . Codeine Nausea And Vomiting  . Gilenya [Fingolimod Hydrochloride] Other (See Comments)    MACULAR EDEMA  . Morphine And Related Other (See Comments)    IV ROUTE - RED STREAKS OF VEIN  . Sumatriptan Nausea Only    INJECTABLE ONLY - RAPID HEART RATE, FLUSHING  . Tysabri [Natalizumab] Other (See Comments)    DEVELOPED JCV ANTIBODY  . Latex Rash    Current Outpatient  Prescriptions on File Prior to Visit  Medication Sig Dispense Refill  . calcium carbonate (OS-CAL) 600 MG TABS tablet Take 1,200 mg by mouth.     . dalfampridine 10 MG TB12 Take 5 mg by mouth 3 (three) times daily. Dose change    . diphenhydramine-acetaminophen (TYLENOL PM) 25-500 MG TABS Take 2 tablets by mouth at bedtime as needed (sleep). Reported on 01/13/2016    . lamoTRIgine (LAMICTAL) 150 MG tablet One po bid 60 tablet 11  . loratadine (CLARITIN) 10 MG tablet Take 10 mg by mouth daily.    . Meclizine HCl 25 MG CHEW Chew 2 tablets by mouth as needed (dizziness).     . Multiple Vitamins-Minerals (MULTIVITAMIN PO) Take by mouth. Brand name - Mature Multivitamin from [employer]    . pantoprazole (PROTONIX) 40 MG tablet Take 40 mg by mouth daily.    Marland Kitchen rOPINIRole (REQUIP) 0.25 MG tablet One or two at night 60 tablet 11  . Teriparatide, Recombinant, 600 MCG/2.4ML SOLN Inject 20 mcg into the skin. Reported on 12/15/2015    . tiZANidine (ZANAFLEX) 4 MG tablet Reported on 01/13/2016  0  . Vitamin D, Ergocalciferol, (DRISDOL) 50000 UNITS CAPS capsule Take 1 capsule (50,000 Units total) by mouth every 7 (seven) days. When finished, take an otc daily vitamin d supplement of 5,000iu. (Patient taking differently: Take 50,000 Units by mouth 2 (two) times a week. When finished, take an otc daily vitamin d supplement of  5,000iu.) 12 capsule 0  . [DISCONTINUED] estrogens, conjugated, (PREMARIN) 0.3 MG tablet Take 0.3 mg by mouth every other day. Take daily for 21 days then do not take for 7 days.     No current facility-administered medications on file prior to visit.    Past Medical History  Diagnosis Date  . Osteoporosis   . Multiple sclerosis ([place])   . PONV (postoperative nausea and vomiting)   . Migraines   . COPD (chronic obstructive pulmonary disease) ([place])     denies SOB with ADLs  . Arthritis     back  . Sjogren's disease ([place])     dryness of eyes, mouth  . GERD (gastroesophageal reflux  disease)   . Neurogenic bladder   . Trigger thumb of left hand 12/2014  . Mitral valve prolapse     states no problem  . Heart murmur   . 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 Frequent falls     due to MS  . Dental crowns present     also dental caps  . Family history of adverse reaction to anesthesia     pt's mother had hx. of post-op nausea  . Movement disorder   . Transverse myelitis ([place])   . Cancer [facility]) 2011    Past Surgical History  Procedure Laterality Date  . Tonsillectomy  age 12  . Oophorectomy Bilateral   . Vaginal hysterectomy  age 77  . Cataract extraction Bilateral 2015  . Hemorroidectomy  08/05/2010    2-column hemorrhoidectomy  . Shoulder arthroscopy Bilateral   . Appendectomy  06/22/2011    laparoscopic  . Bunionectomy    . Cervical spine surgery  2002; 2005    x 2 - C3-4; C4-5  . Breast enhancement surgery Bilateral   . Breast implant removal Bilateral   . Incontinence surgery    . Elbow surgery Right   . Intercostal nerve block  2015    occipital  . Trigger finger release Left 01/16/2015    Procedure: LEFT THUMB TRIGGER RELEASE ;  Surgeon: Leanora Cover, MD;  Location: [place];  Service: Orthopedics;  Laterality: Left;    Family History  Problem Relation Age of Onset  . Liver disease Father   . Cirrhosis Father   . Stroke Mother   . Cancer Mother     oropharyngeal  . Heart attack Mother   . Anesthesia problems Mother     post-op nausea  . Cancer Brother     twin; tonsillar?  . Cancer Sister 46    breast ca, lung ca  . Cancer Other     Nephew; appendiceal carcnoid, melanoma  . Cancer Paternal Grandmother     cervical    Social History   Social History  . Marital Status: Married    Spouse Name: Gershon Mussel  . Number of Children: 2  . Years of Education: college   Occupational History  . Retired    Social History Main Topics  . Smoking status: Passive Smoke Exposure - Never Smoker  . Smokeless tobacco: Never  Used     Comment: parents, husband, & multiple family members  . Alcohol Use: No  . Drug Use: No  . Sexual Activity: Not Asked   Other Topics Concern  . None   Social History Narrative   Pt lives at home with her spouse of 78 years.   Caffeine Use- Drinks caffeine occasionally.      [facility] [facility]:  From [place]. Previously did administrative work. She currently has a cat & dog. No bird exposure. No mold or hot tub exposure. No carpet or draperies.             Objective:   Physical Exam BP 130/70 mmHg  Pulse 97  Ht 5\' 10"  (1.778 m)  Wt 141 lb 3.2 oz (64.048 kg)  BMI 20.26 kg/m2  SpO2 98% General:  Awake. Alert. No acute distress. Thin Caucasian female.  Integument:  Warm & dry. No rash on exposed skin. No bruising. Lymphatics:  No appreciated cervical or supraclavicular lymphadenoapthy. HEENT:  Moist mucus membranes. No oral ulcers. No scleral injection or icterus. Mild bilateral nasal turbinate swelling with erythematous mucosa right greater than left. Cardiovascular:  Regular rate. No edema. No appreciable JVD.  Pulmonary:  Good aeration & clear to auscultation bilaterally. Symmetric chest wall expansion. No accessory muscle use. Abdomen: Soft. Normal bowel sounds. Nondistended. Grossly nontender. Musculoskeletal:  Normal bulk and tone. Hand grip strength 5/5 bilaterally. No joint deformity or effusion appreciated. Neurological:  CN 2-12 grossly in tact. No meningismus. Moving all 4 extremities equally. Symmetric brachioradialis deep tendon reflexes. Psychiatric:  Mood and affect congruent. Speech normal rhythm, rate & tone.   IMAGING CXR PA/LAT 04/19/16 (per radiologist): Heart normal in size. No infiltrate, edema, mass, or effusion. Bony structures unremarkable.  CT CHEST W/ 09/24/13 (personally reviewed by me): 3 mm nodule right upper lobe. No other nodule or opacity appreciated. No pleural effusion or thickening. No pathologic mediastinal adenopathy. No pericardial  effusion.  LABS 04/27/16 CBC: 4.5/13.5/43.9/265 BMP: 142/4.5/109/29/11/1.0/84/9.7 LFT: 3.7/6.4/0.3/67/16/16  02/05/15 ANA:  Negative    Assessment & Plan:  65 year old female with history of Sjogren's presenting with ongoing cough as well as dyspnea does seem to be worsening. Allergic rhinitis does not seem to be contributing at this point but I remain suspicious about ongoing silent laryngo-esophageal reflux. In the absence of wheezing I do not feel that underlying asthma is likely nor do I feel that an infectious etiology is probable without B type symptoms. Certainly parenchymal lung disease from her Sjogren's must be considered but her previous ANA from 2016 was negative. I instructed the patient contact my office if she had any new breathing problems before her next appointment.  1. Cough: Prescribing Ventolin inhaler to use as needed. Recommended stopping Symbicort. Checking CT chest without contrast & full pulmonary function testing on her before next appointment. Checking CT chest without contrast. 2. Sjogren's disease: Checking ANA with reflex to comments panel, SSA, & SSB. 3. GERD: Continuing on Protonix. Checking esophagogram. 4. Follow-up: Patient to return to clinic in 3-4 weeks.  Sonia Baller Ashok Cordia, M.D. [facility] Pulmonary & Critical Care Pager:  [phone] After 3pm or if no response, call [phone] 3:04 PM 05/03/2016

## 2016-05-11 ENCOUNTER — Telehealth: Payer: Self-pay | Admitting: Neurology

## 2016-05-11 NOTE — Telephone Encounter (Signed)
CONFIRMING DATE AND TIME OF NEXT APPT    Pt spoke with Hinton Dyer to confirm appt.

## 2016-05-12 ENCOUNTER — Encounter: Payer: Self-pay | Admitting: Neurology

## 2016-05-12 ENCOUNTER — Ambulatory Visit (INDEPENDENT_AMBULATORY_CARE_PROVIDER_SITE_OTHER): Payer: PPO | Admitting: Neurology

## 2016-05-12 VITALS — BP 102/70 | HR 68 | Resp 14 | Ht 70.0 in | Wt 142.0 lb

## 2016-05-12 DIAGNOSIS — G373 Acute transverse myelitis in demyelinating disease of central nervous system: Secondary | ICD-10-CM | POA: Diagnosis not present

## 2016-05-12 DIAGNOSIS — R261 Paralytic gait: Secondary | ICD-10-CM | POA: Diagnosis not present

## 2016-05-12 DIAGNOSIS — R5383 Other fatigue: Secondary | ICD-10-CM

## 2016-05-12 DIAGNOSIS — M542 Cervicalgia: Secondary | ICD-10-CM

## 2016-05-12 DIAGNOSIS — R208 Other disturbances of skin sensation: Secondary | ICD-10-CM | POA: Diagnosis not present

## 2016-05-12 DIAGNOSIS — G35 Multiple sclerosis: Secondary | ICD-10-CM | POA: Diagnosis not present

## 2016-05-12 NOTE — Progress Notes (Signed)
[facility]  PATIENT: [name] DOB: 1951/04/01  REFERRING CLINICIAN: Crist Infante HISTORY FROM: patient REASON FOR VISIT: transverse myelitis, MS?   HISTORICAL  CHIEF COMPLAINT:  Chief Complaint  Patient presents with  . Transverse Myelitis    Sts. she is walking much better with new bilat AFO's./fim  . Neck Pain  . Headache  . Gait Disturbance  . Visual Disturbance    Sts. has had some left eye pain for several mos., also feels the colors just arent as bright when looking out of left eye.  Plans to see her opthalmologist/fim    HISTORY OF PRESENT ILLNESS:  [name] is a 65 yo woman with Multiple Sclerosis.   She has transverse myelitis, gait disturbance, dysesthesia, urinary issues and right sided headache.      Right sided headaches and neck pain:   She reports pain in the right neck and occiput is much better since the occipital nerve block and trigger point injections (neck muscles) 3 months ago.   Pain was radiating forward to the right vertex.         Transverse myelitis/gait/strength/sensation/bladder:    She has a history of a T4 transverse myelitis.   She has fallen some but feels the new AFO braces are helping more than the older braces.    They are lighter and dont cause her to sweat like the old ones.  When tired, she drags her left leg some but with new AFO's is not tripping.        She is on  Ampyra 5 mg po tid (compounded) and tolerates it well.   Lamotrigine is helping her burning dysesthesias in both feet and hands.    Lyrica and gabapentin only helped a bit and the benfit was less after a while.   Burning is worse at night.  SHe was once on amitriptyline many years ago and had a benefit for a while only.    Vision:   She sees colors better out of the right eye than the left eye.    She first noticed this 2 weeks ago.  OS is more blurry.   She will be seeing ophthalmology.     She had macular edema on Gilenya that improved after  stopping it.      Bladder:  She has urinary frequency and incontinence since the early 1990s. Myrbetriq helped some (only minimal) but other med's did not.   She also reports difficulty with urinary hesitancy but has no recent UTI.     Flomax had not helped.     Fatigue/sleep/mood:  She feels fatigue is better than last visit since she sleeps better without headache.     She denies depression or anxiety.  She sometimes has difficulty coming up with the right words. She has had difficulty multitasking and completing tasks.     Other:   She is seeing pulmonary for a h/o pulmonary nodule and some coughing and will have a f/u CT Monday  TM/MS History:  In 1989, she had severe numbness in one side of her body.  She had clumsiness and poor gait.  MRI was reportedly normal.     She saw Dr. Erling Cruz who did an LP that showed oligoclonal bands.   She received IV steroids and was told she had MS.   In 1993, she woke up numb from the waist down in both legs.  This numbness was more intense than the prior episodes and gait was very poor.  She was admitted x 28 days, receiving many days of IV steroids followed by Rehab.     An MRI of the brain was normal but the MRI of the thoracic spine showed a plaque at T4.     She was started on Betaseron and did well in general.  She had some fluctuating symptom but nothing resembling any of the other 3 episodes.   In November 2014 she had additional imaging studies showing a normal MRI of the brain, an abnormal MRI of the thoracic spine with a focus at T4, and a normal cervical spinal cord. She did have evidence of prior fusion surgery in the neck.  MRI of the cervical spine in September 2016 showed stable postoperative ACDF at C4-C5. The spinal cord appeared normal.   There was no myelopathy in the cervical spine. She also underwent a lumbar puncture in November 2014. It was normal and did not show oligoclonal bands or increased IgG index.    She has been treated with Betaseron,  Avonex, Tysabri and Gilenya for presumptive MS in the past.   She stopped Tysabri as was JCV Ab positive and stopped Gilenya as she had macula edema.   She tried [place] but stopped due to insurance.  No DMT since 2014.       REVIEW OF SYSTEMS:  Constitutional: No fevers, chills, sweats, or change in .  She reports fatigue Eyes: as above Ear, nose and throat: No hearing loss, ear pain, nasal congestion, sore throat Cardiovascular: No chest pain, palpitations Respiratory:  No shortness of breath at rest or with exertion.   No wheezes GastrointestinaI: No nausea, vomiting, diarrhea, abdominal pain.  Reports fecal incontinence.   Reports dysphagia Genitourinary:  see above Musculoskeletal:  No neck pain, back pain.   Hasmuscle cramps Integumentary: No rash, pruritus, skin lesions Neurological: as above Psychiatric: No depression at this time.  No anxiety Endocrine: No palpitations, diaphoresis, change in appetite, change in weigh or increased thirst Hematologic/Lymphatic:  No anemia, purpura, petechiae. Allergic/Immunologic: No itchy/runny eyes, nasal congestion, recent allergic reactions, rashes  ALLERGIES: Allergies  Allergen Reactions  . Other Shortness Of Breath    PURPLE LETTUCE  . Betaseron [Interferon Beta-1b] Other (See Comments)    HARD NODULAR AREAS AT INJECTION SITE  . Codeine Nausea And Vomiting  . Gilenya [Fingolimod Hydrochloride] Other (See Comments)    MACULAR EDEMA  . Morphine And Related Other (See Comments)    IV ROUTE - RED STREAKS OF VEIN  . Sumatriptan Nausea Only    INJECTABLE ONLY - RAPID HEART RATE, FLUSHING  . Tysabri [Natalizumab] Other (See Comments)    DEVELOPED JCV ANTIBODY  . Latex Rash    HOME MEDICATIONS: Outpatient Prescriptions Prior to Visit  Medication Sig Dispense Refill  . albuterol (PROVENTIL HFA;VENTOLIN HFA) 108 (90 Base) MCG/ACT inhaler Inhale 2 puffs into the lungs every 4 (four) hours as needed for shortness of breath. 1 Inhaler 1  .  budesonide-formoterol (SYMBICORT) 160-4.5 MCG/ACT inhaler Inhale into the lungs.    . calcium carbonate (OS-CAL) 600 MG TABS tablet Take 1,200 mg by mouth.     . dalfampridine 10 MG TB12 Take 5 mg by mouth 3 (three) times daily. Dose change    . diphenhydramine-acetaminophen (TYLENOL PM) 25-500 MG TABS Take 2 tablets by mouth at bedtime as needed (sleep). Reported on 01/13/2016    . lamoTRIgine (LAMICTAL) 150 MG tablet One po bid 60 tablet 11  . loratadine (CLARITIN) 10 MG tablet Take 10 mg by mouth  daily.    . Meclizine HCl 25 MG CHEW Chew 2 tablets by mouth as needed (dizziness).     . Multiple Vitamins-Minerals (MULTIVITAMIN PO) Take by mouth. Brand name - Mature Multivitamin from [employer]    . pantoprazole (PROTONIX) 40 MG tablet Take 40 mg by mouth daily.    Marland Kitchen rOPINIRole (REQUIP) 0.25 MG tablet One or two at night 60 tablet 11  . Teriparatide, Recombinant, 600 MCG/2.4ML SOLN Inject 20 mcg into the skin. Reported on 12/15/2015    . tiZANidine (ZANAFLEX) 4 MG tablet Reported on 01/13/2016  0  . Vitamin D, Ergocalciferol, (DRISDOL) 50000 UNITS CAPS capsule Take 1 capsule (50,000 Units total) by mouth every 7 (seven) days. When finished, take an otc daily vitamin d supplement of 5,000iu. (Patient taking differently: Take 50,000 Units by mouth 2 (two) times a week. When finished, take an otc daily vitamin d supplement of 5,000iu.) 12 capsule 0   No facility-administered medications prior to visit.    PAST MEDICAL HISTORY: Past Medical History  Diagnosis Date  . Osteoporosis   . Multiple sclerosis ([place])   . PONV (postoperative nausea and vomiting)   . Migraines   . COPD (chronic obstructive pulmonary disease) ([place])     denies SOB with ADLs  . Arthritis     back  . Sjogren's disease ([place])     dryness of eyes, mouth  . GERD (gastroesophageal reflux disease)   . Neurogenic bladder   . Trigger thumb of left hand 12/2014  . Mitral valve prolapse     states no problem  . Heart murmur   .  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 Frequent falls     due to MS  . Dental crowns present     also dental caps  . Family history of adverse reaction to anesthesia     pt's mother had hx. of post-op nausea  . Movement disorder   . Transverse myelitis ([place])   . Cancer [facility]) 2011    PAST SURGICAL HISTORY: Past Surgical History  Procedure Laterality Date  . Tonsillectomy  age 58  . Oophorectomy Bilateral   . Vaginal hysterectomy  age 33  . Cataract extraction Bilateral 2015  . Hemorroidectomy  08/05/2010    2-column hemorrhoidectomy  . Shoulder arthroscopy Bilateral   . Appendectomy  06/22/2011    laparoscopic  . Bunionectomy    . Cervical spine surgery  2002; 2005    x 2 - C3-4; C4-5  . Breast enhancement surgery Bilateral   . Breast implant removal Bilateral   . Incontinence surgery    . Elbow surgery Right   . Intercostal nerve block  2015    occipital  . Trigger finger release Left 01/16/2015    Procedure: LEFT THUMB TRIGGER RELEASE ;  Surgeon: Leanora Cover, MD;  Location: [place];  Service: Orthopedics;  Laterality: Left;    FAMILY HISTORY: Family History  Problem Relation Age of Onset  . Liver disease Father   . Cirrhosis Father   . Stroke Mother   . Cancer Mother     oropharyngeal  . Heart attack Mother   . Anesthesia problems Mother     post-op nausea  . Cancer Brother     twin; tonsillar?  . Cancer Sister 74    breast ca, lung ca  . Cancer Other     Nephew; appendiceal carcnoid, melanoma  . Cancer Paternal Grandmother  cervical    SOCIAL HISTORY:  Social History   Social History  . Marital Status: Married    Spouse Name: Gershon Mussel  . Number of Children: 2  . Years of Education: college   Occupational History  . Retired    Social History Main Topics  . Smoking status: Passive Smoke Exposure - Never Smoker  . Smokeless tobacco: Never Used     Comment: parents, husband, & multiple family members  . Alcohol Use: No    . Drug Use: No  . Sexual Activity: Not on file   Other Topics Concern  . Not on file   Social History Narrative   Pt lives at home with her spouse of 42 years.   Caffeine Use- Drinks caffeine occasionally.       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 She currently has a cat & dog. No bird exposure. No mold or hot tub exposure. No carpet or draperies.            PHYSICAL EXAM  Filed Vitals:   05/12/16 1519  BP: 102/70  Pulse: 68  Resp: 14  Height: 5\' 10"  (1.778 m)  Weight: 142 lb (64.411 kg)    Body mass index is 20.37 kg/(m^2).   General: The patient is well-developed and well-nourished and in no acute distress  HEENT:    [place]/AT, fundoscopic exam shows normal optic discs and retinal vessels.  VA is 20/40 OS and 20/30 OD  Neck:   There is no significant neck tenderness now.  Good ROM  Very tender over left shoulder with reduced ROM  Neurologic Exam  Mental status: The patient is alert and oriented x 3 at the time of the examination. The patient has apparent normal recent and remote memory, with an apparently normal attention span and concentration ability.   Speech is normal.  Cranial nerves: Extraocular movements are full.  She has a 2+ left APD. Colors are desaturated OS  There is good facial sensation to soft touch bilaterally.Facial strength is normal.  Trapezius and sternocleidomastoid strength is normal. No dysarthria is noted.    Motor:  Muscle bulk and tone are normal. Strength is  4+/ 5 in the left and 5/5 in the right arm.   Leg strength is 4/5 and ankle dorsiflexion 4-/5 EHL, slightly worse on left  Sensory: Sensory testing shows symmetric arm sensation to touch and vibration but reduced vibration in left leg.     Coordination: Cerebellar testing reveals reduced left RAM and left finger-nose-finger and she has difficulty doing heel-to-shin, on either side.  Gait and station: Station is stable eyes open.  Gait is mildly spastic.   She is walking better with the new AFOs she can walk without her cane. .   She can not tandem.  Romberg positive  Reflexes: Deep tendon reflexes are symmetric and normal in arms, 3+ at knees.      DIAGNOSTIC DATA (LABS, IMAGING, TESTING) - I reviewed patient records, labs, notes, testing and imaging myself where available.     ASSESSMENT AND PLAN  MULTIPLE SCLEROSIS  Transverse myelitis (HCC)  Dysesthesia  Neck pain  Other fatigue  Spastic gait    1.  Continue lamotrigine twice a day for dysesthesias.   Continue compounded 4-AP for improved gait/strength. 2.   She will wear the bilateral AFOs daily. 3.  Continue lamotrigine 150 mg twice a day.   RTC 4-6 months, sooner if problems   Richard A. Felecia Shelling, MD, PhD 05/12/2016, 3:22 PM  Certified in Neurology, [place] [facility], Sleep Medicine, Pain Medicine and Neuroimaging  [facility] [address] [place], [place] [address] [phone]

## 2016-05-13 ENCOUNTER — Other Ambulatory Visit (HOSPITAL_COMMUNITY): Payer: PPO

## 2016-05-13 ENCOUNTER — Ambulatory Visit (HOSPITAL_COMMUNITY): Payer: PPO

## 2016-05-13 DIAGNOSIS — H1132 Conjunctival hemorrhage, left eye: Secondary | ICD-10-CM | POA: Diagnosis not present

## 2016-05-17 ENCOUNTER — Other Ambulatory Visit: Payer: Self-pay | Admitting: Pulmonary Disease

## 2016-05-17 ENCOUNTER — Ambulatory Visit (HOSPITAL_COMMUNITY)
Admission: RE | Admit: 2016-05-17 | Discharge: 2016-05-17 | Disposition: A | Discharge status: Home / Self Care | Payer: PPO | Source: Ambulatory Visit | Attending: Pulmonary Disease | Admitting: Pulmonary Disease

## 2016-05-17 DIAGNOSIS — R918 Other nonspecific abnormal finding of lung field: Secondary | ICD-10-CM | POA: Diagnosis not present

## 2016-05-17 DIAGNOSIS — K219 Gastro-esophageal reflux disease without esophagitis: Secondary | ICD-10-CM

## 2016-05-17 DIAGNOSIS — R05 Cough: Secondary | ICD-10-CM | POA: Diagnosis not present

## 2016-05-17 DIAGNOSIS — R059 Cough, unspecified: Secondary | ICD-10-CM

## 2016-05-17 DIAGNOSIS — I700 Atherosclerosis of aorta: Secondary | ICD-10-CM | POA: Insufficient documentation

## 2016-05-17 DIAGNOSIS — R0602 Shortness of breath: Secondary | ICD-10-CM | POA: Diagnosis not present

## 2016-05-27 ENCOUNTER — Telehealth: Payer: Self-pay | Admitting: Neurology

## 2016-05-27 MED ORDER — DALFAMPRIDINE ER 10 MG PO TB12: 5.0000 mg | ORAL_TABLET | Freq: Three times a day (TID) | ORAL | Status: DC

## 2016-05-27 NOTE — Telephone Encounter (Signed)
Ampyra escribed to [place] per pt's request/fim

## 2016-05-27 NOTE — Telephone Encounter (Signed)
Patient is calling. She called [facility] to get a refill for medication Ampyra which she thinks is generic. She was told by the pharmacy that she no longer takes this medication. Please call to discuss because she has not stopped taking this medication.

## 2016-05-31 ENCOUNTER — Telehealth: Payer: Self-pay | Admitting: Neurology

## 2016-05-31 MED ORDER — DALFAMPRIDINE ER 10 MG PO TB12: ORAL_TABLET | ORAL | Status: DC

## 2016-05-31 MED ORDER — DALFAMPRIDINE ER 10 MG PO TB12: 5.0000 mg | ORAL_TABLET | Freq: Three times a day (TID) | ORAL | Status: DC

## 2016-05-31 NOTE — Telephone Encounter (Signed)
Betsy with [facility] is calling about Rx dalampridine 10 mg TB12 and states they have normally been making the 5 mg immediate release capsules for this patient. The pharmacy want to know if you want the compounded capsule immediate release or the extended release tablets. It was stated that the patient was unaware of the dosage going from 5 mg 3 X day to 10 mg 3 X day.  Please call to clarify [phone].

## 2016-05-31 NOTE — Telephone Encounter (Signed)
I have spoken with [facility] with [place] and clarified pt. should be on 4-AP 5mg  po tid./fim

## 2016-06-03 ENCOUNTER — Encounter: Payer: Self-pay | Admitting: Pulmonary Disease

## 2016-06-03 ENCOUNTER — Ambulatory Visit (INDEPENDENT_AMBULATORY_CARE_PROVIDER_SITE_OTHER): Payer: PPO | Admitting: Pulmonary Disease

## 2016-06-03 ENCOUNTER — Other Ambulatory Visit (HOSPITAL_COMMUNITY): Payer: Self-pay | Admitting: Pulmonary Disease

## 2016-06-03 ENCOUNTER — Ambulatory Visit (HOSPITAL_COMMUNITY)
Admission: RE | Admit: 2016-06-03 | Discharge: 2016-06-03 | Disposition: A | Discharge status: Home / Self Care | Payer: PPO | Source: Ambulatory Visit | Attending: Pulmonary Disease | Admitting: Pulmonary Disease

## 2016-06-03 ENCOUNTER — Other Ambulatory Visit: Payer: PPO

## 2016-06-03 VITALS — BP 106/64 | HR 87 | Ht 70.0 in | Wt 141.6 lb

## 2016-06-03 DIAGNOSIS — R918 Other nonspecific abnormal finding of lung field: Secondary | ICD-10-CM

## 2016-06-03 DIAGNOSIS — K219 Gastro-esophageal reflux disease without esophagitis: Secondary | ICD-10-CM | POA: Diagnosis not present

## 2016-06-03 DIAGNOSIS — M3500 Sicca syndrome, unspecified: Secondary | ICD-10-CM

## 2016-06-03 DIAGNOSIS — R1310 Dysphagia, unspecified: Secondary | ICD-10-CM

## 2016-06-03 DIAGNOSIS — R059 Cough, unspecified: Secondary | ICD-10-CM

## 2016-06-03 DIAGNOSIS — R05 Cough: Secondary | ICD-10-CM

## 2016-06-03 LAB — PULMONARY FUNCTION TEST
DL/VA % pred: 65 %
DL/VA: 3.56 ml/min/mmHg/L
DLCO unc % pred: 62 %
DLCO unc: 20.12 ml/min/mmHg
FEF 25-75 Post: 2.93 L/sec
FEF 25-75 Pre: 2.85 L/sec
FEF2575-%CHANGE-POST: 2 %
FEF2575-%PRED-POST: 115 %
FEF2575-%Pred-Pre: 112 %
FEV1-%CHANGE-POST: 0 %
FEV1-%PRED-POST: 97 %
FEV1-%Pred-Pre: 97 %
FEV1-POST: 2.96 L
FEV1-PRE: 2.96 L
FEV1FVC-%CHANGE-POST: 4 %
FEV1FVC-%Pred-Pre: 102 %
FEV6-%Change-Post: -4 %
FEV6-%PRED-PRE: 97 %
FEV6-%Pred-Post: 92 %
FEV6-PRE: 3.73 L
FEV6-Post: 3.55 L
FEV6FVC-%Change-Post: 0 %
FEV6FVC-%PRED-PRE: 103 %
FEV6FVC-%Pred-Post: 103 %
FVC-%CHANGE-POST: -4 %
FVC-%PRED-POST: 90 %
FVC-%PRED-PRE: 94 %
FVC-POST: 3.59 L
FVC-PRE: 3.76 L
POST FEV6/FVC RATIO: 99 %
PRE FEV6/FVC RATIO: 99 %
Post FEV1/FVC ratio: 83 %
Pre FEV1/FVC ratio: 79 %
RV % PRED: 69 %
RV: 1.64 L
TLC % pred: 90 %
TLC: 5.38 L

## 2016-06-03 MED ORDER — ALBUTEROL SULFATE (2.5 MG/3ML) 0.083% IN NEBU
2.5000 mg | INHALATION_SOLUTION | Freq: Once | RESPIRATORY_TRACT | Status: AC
  Administered 2016-06-03: 2.5 mg via RESPIRATORY_TRACT

## 2016-06-03 NOTE — Addendum Note (Signed)
Addended by: Raymondo Band D on: 06/03/2016 03:26 PM   Modules accepted: Orders

## 2016-06-03 NOTE — Patient Instructions (Signed)
   Call me if you have any breathing problems or coughing before your next appointment  We will contact you with the results of your swallowing test.  I will see you back in 2-3 months.  TESTS ORDERED: 1. Modified Barium Swallow

## 2016-06-03 NOTE — Progress Notes (Signed)
Subjective:    Patient ID: [name], female    DOB: 06-Aug-1951, 65 y.o.   MRN: [id number]  C.C.:  Follow-up for Cough, Right Lung Nodules, & GERD.  HPI Cough:  She reports cough remains and is unchanged. Cough is sometimes with exertion & sometimes with eating. Drinking liquids causes cough. Cough seems to be worse with liquids. She reports cough is nonproductive. Does have ongoing dyspnea.   Right Lung Nodules: Nodules noted on CT imaging 05/17/16. Nodules are unchanged when compared with 2014 nodule.   GERD:  No reflux or dyspepsia. No morning brash water taste. Compliant with Protonix.   Review of Systems No chest pain or pressure. No fever, chills, or sweats. No sinus congestion, pressure, or drainage.   Allergies  Allergen Reactions  . Other Shortness Of Breath    PURPLE LETTUCE  . Betaseron [Interferon Beta-1b] Other (See Comments)    HARD NODULAR AREAS AT INJECTION SITE  . Codeine Nausea And Vomiting  . Gilenya [Fingolimod Hydrochloride] Other (See Comments)    MACULAR EDEMA  . Morphine And Related Other (See Comments)    IV ROUTE - RED STREAKS OF VEIN  . Sumatriptan Nausea Only    INJECTABLE ONLY - RAPID HEART RATE, FLUSHING  . Tysabri [Natalizumab] Other (See Comments)    DEVELOPED JCV ANTIBODY  . Latex Rash    Current Outpatient Prescriptions on File Prior to Visit  Medication Sig Dispense Refill  . albuterol (PROVENTIL HFA;VENTOLIN HFA) 108 (90 Base) MCG/ACT inhaler Inhale 2 puffs into the lungs every 4 (four) hours as needed for shortness of breath. 1 Inhaler 1  . calcium carbonate (OS-CAL) 600 MG TABS tablet Take 1,200 mg by mouth.     . dalfampridine 10 MG TB12 Take 1 tablet (10 mg total) by mouth 3 (three) times daily. Dose change (Patient taking differently: Take 5 mg by mouth 3 (three) times daily. Dose change - 5 mg tid) 90 tablet 11  . diphenhydramine-acetaminophen (TYLENOL PM) 25-500 MG TABS Take 2 tablets by mouth at bedtime as needed (sleep).  Reported on 01/13/2016    . lamoTRIgine (LAMICTAL) 150 MG tablet One po bid 60 tablet 11  . loratadine (CLARITIN) 10 MG tablet Take 10 mg by mouth daily.    . Meclizine HCl 25 MG CHEW Chew 2 tablets by mouth as needed (dizziness).     . Multiple Vitamins-Minerals (MULTIVITAMIN PO) Take by mouth. Brand name - Mature Multivitamin from [employer]    . pantoprazole (PROTONIX) 40 MG tablet Take 40 mg by mouth daily.    Marland Kitchen rOPINIRole (REQUIP) 0.25 MG tablet One or two at night 60 tablet 11  . Teriparatide, Recombinant, 600 MCG/2.4ML SOLN Inject 20 mcg into the skin. Reported on 12/15/2015    . Vitamin D, Ergocalciferol, (DRISDOL) 50000 UNITS CAPS capsule Take 1 capsule (50,000 Units total) by mouth every 7 (seven) days. When finished, take an otc daily vitamin d supplement of 5,000iu. (Patient taking differently: Take 50,000 Units by mouth. Take twice weekly.  WHenfinished, take an otc daily vitamin d supplement of 5,000iu.) 12 capsule 0  . budesonide-formoterol (SYMBICORT) 160-4.5 MCG/ACT inhaler Inhale into the lungs. Reported on 06/03/2016    . [DISCONTINUED] estrogens, conjugated, (PREMARIN) 0.3 MG tablet Take 0.3 mg by mouth every other day. Take daily for 21 days then do not take for 7 days.     No current facility-administered medications on file prior to visit.    Past Medical History  Diagnosis Date  .  Osteoporosis   . Multiple sclerosis ([place])   . PONV (postoperative nausea and vomiting)   . Migraines   . COPD (chronic obstructive pulmonary disease) ([place])     denies SOB with ADLs  . Arthritis     back  . Sjogren's disease ([place])     dryness of eyes, mouth  . GERD (gastroesophageal reflux disease)   . Neurogenic bladder   . Trigger thumb of left hand 12/2014  . Mitral valve prolapse     states no problem  . Heart murmur   . 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 Frequent falls     due to MS  . Dental crowns present     also dental caps  . Family history of adverse  reaction to anesthesia     pt's mother had hx. of post-op nausea  . Movement disorder   . Transverse myelitis ([place])   . Cancer [facility]) 2011    Past Surgical History  Procedure Laterality Date  . Tonsillectomy  age 33  . Oophorectomy Bilateral   . Vaginal hysterectomy  age 27  . Cataract extraction Bilateral 2015  . Hemorroidectomy  08/05/2010    2-column hemorrhoidectomy  . Shoulder arthroscopy Bilateral   . Appendectomy  06/22/2011    laparoscopic  . Bunionectomy    . Cervical spine surgery  2002; 2005    x 2 - C3-4; C4-5  . Breast enhancement surgery Bilateral   . Breast implant removal Bilateral   . Incontinence surgery    . Elbow surgery Right   . Intercostal nerve block  2015    occipital  . Trigger finger release Left 01/16/2015    Procedure: LEFT THUMB TRIGGER RELEASE ;  Surgeon: Leanora Cover, MD;  Location: [place];  Service: Orthopedics;  Laterality: Left;    Family History  Problem Relation Age of Onset  . Liver disease Father   . Cirrhosis Father   . Stroke Mother   . Cancer Mother     oropharyngeal  . Heart attack Mother   . Anesthesia problems Mother     post-op nausea  . Cancer Brother     twin; tonsillar?  . Cancer Sister 23    breast ca, lung ca  . Cancer Other     Nephew; appendiceal carcnoid, melanoma  . Cancer Paternal Grandmother     cervical    Social History   Social History  . Marital Status: Married    Spouse Name: Gershon Mussel  . Number of Children: 2  . Years of Education: college   Occupational History  . Retired    Social History Main Topics  . Smoking status: Passive Smoke Exposure - Never Smoker  . Smokeless tobacco: Never Used     Comment: parents, husband, & multiple family members  . Alcohol Use: No  . Drug Use: No  . Sexual Activity: Not Asked   Other Topics Concern  . None   Social History Narrative   Pt lives at home with her spouse of 85 years.   Caffeine Use- Drinks caffeine occasionally.       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 No bird exposure. No mold or hot tub exposure. No carpet or draperies.             Objective:   Physical Exam BP 106/64 mmHg  Pulse 87  Ht 5\' 10"  (1.778 m)  Wt 141 lb 9.6 oz (64.229 kg)  BMI 20.32 kg/m2  SpO2 98% General:  Awake. Alert. No acute distress. Caucasian female.  Integument:  Warm & dry. No rash on exposed skin.  Lymphatics:  No appreciated cervical or supraclavicular lymphadenoapthy. HEENT:  Moist mucus membranes. No oral ulcers. No scleral injection. Minimal nasal turbinate swelling. Cardiovascular:  Regular rate. No edema. Normal S1 & S2.  Pulmonary:  Clear bilaterally to auscultation. Normal work of breathing on room air. Speaking complete sentences.  PFT 06/03/16: FVC 3.76 L (94%) FEV1 2.96 L (97%) FEV1/FVC 0.79 FEF 25-75 2.85 L (112%) no bronchodilator response TLC 5.3 L (90%) RV 69% ERV 92% DLCO uncorrected 62%  IMAGING BARIUM SWALLOW 05/17/16 (per radiologist): Normal esophagogram without mucosal irregularity or mass. No hiatal hernia.  CT CHEST W/O 05/17/16 (personally reviewed by me): No pleural effusion or thickening. No pericardial effusion. No pathologic mediastinal adenopathy. 4 mm right upper lobe nodule & 5 mm right lower lobe nodule.  CXR PA/LAT 04/19/16 (per radiologist): Heart normal in size. No infiltrate, edema, mass, or effusion. Bony structures unremarkable.  CT CHEST W/ 09/24/13 (previously reviewed by me): 3 mm nodule right upper lobe and 60mm right lower lobe nodule. No other nodule or opacity appreciated. No pleural effusion or thickening. No pathologic mediastinal adenopathy. No pericardial effusion.  LABS 04/27/16 CBC: 4.5/13.5/43.9/265 BMP: 142/4.5/109/29/11/1.0/84/9.7 LFT: 3.7/6.4/0.3/67/16/16  02/05/15 ANA:  Negative    Assessment & Plan:  65 year old female with history of Sjogren's With ongoing cough. I do question whether or not the patient  may be having some aspiration with history of multiple sclerosis. We reviewed her chest CT imaging which shows the nodules in her right lung which have been stable since 2014. No further imaging is necessary at this time. If a modified barium swallow does not show aspiration patient will require ENT evaluation with direct laryngoscopy. I instructed the patient to contact my office if she had any further questions or concerns before next appointment.  1. Cough: Checking modified barium swallow to rule out aspiration. May require ENT consultation for direct laryngoscopy depending upon this result. 2. Right Lung Nodules:  Present since 2014. No further testing necessary. 3. GERD: Asymptomatic on Protonix. No changes at this time. 4. Follow-up: Patient to return to clinic in  2-3 months or sooner if needed.  Sonia Baller Ashok Cordia, M.D. [facility] Pulmonary [facility] Critical [facility] Pager:  [phone] After 3pm or if no response, call [phone] 3:24 PM 06/03/2016

## 2016-06-04 LAB — SJOGRENS SYNDROME-A EXTRACTABLE NUCLEAR ANTIBODY: SSA (RO) (ENA) ANTIBODY, IGG: NEGATIVE

## 2016-06-04 LAB — SJOGRENS SYNDROME-B EXTRACTABLE NUCLEAR ANTIBODY: SSB (La) (ENA) Antibody, IgG: <1

## 2016-06-07 LAB — ANA W/REFLEX: ANA: NEGATIVE

## 2016-06-09 ENCOUNTER — Other Ambulatory Visit (HOSPITAL_COMMUNITY): Payer: Self-pay | Admitting: Pulmonary Disease

## 2016-06-09 ENCOUNTER — Ambulatory Visit (HOSPITAL_COMMUNITY)
Admission: RE | Admit: 2016-06-09 | Discharge: 2016-06-09 | Disposition: A | Discharge status: Home / Self Care | Payer: PPO | Source: Ambulatory Visit | Attending: Pulmonary Disease | Admitting: Pulmonary Disease

## 2016-06-09 DIAGNOSIS — T17308A Unspecified foreign body in larynx causing other injury, initial encounter: Secondary | ICD-10-CM | POA: Diagnosis not present

## 2016-06-09 DIAGNOSIS — R1310 Dysphagia, unspecified: Secondary | ICD-10-CM

## 2016-06-09 DIAGNOSIS — R05 Cough: Secondary | ICD-10-CM

## 2016-06-09 DIAGNOSIS — R059 Cough, unspecified: Secondary | ICD-10-CM

## 2016-06-11 NOTE — Progress Notes (Signed)
Quick Note:  Spoke with pt and notified of results per Dr. Ashok Cordia. Pt verbalized understanding and denied any questions.  ______

## 2016-06-15 ENCOUNTER — Telehealth: Payer: Self-pay | Admitting: Neurology

## 2016-06-15 NOTE — Telephone Encounter (Signed)
Pt returned call. I told her the date and time of appt with Dr. Perley Jain office

## 2016-06-15 NOTE — Telephone Encounter (Signed)
LMOM (identified vm) that I have spoken with Tiffany, then ? Barbara at Dr. Perley Jain office--he can see her this Thursday at 2pm (2pm is arrival time.)  She does not need to return this call unless she has questions/fim

## 2016-06-15 NOTE — Telephone Encounter (Signed)
Noted/fim 

## 2016-06-15 NOTE — Telephone Encounter (Signed)
I have spoken with [name] this morning.  She c/o increased dizziness, urinary incontinence for he last 3 days or so.  Does not feel she has a uti. Meds have not helped in the past--Myrbetriq was the last med she tried.  No relief of dizziness with Brandt-Daroff exercises.  Has never had PT/vestibular rehab for dizziness. Sts. she is also being treated for 2 nodules in her lungs that have been present for several yrs. and which she sts. are thought to be benign at this time.  She is having a cough, some shortness of breath, but sts. gas exchange has been normal so it is not thought to be the source of her dizziness.  Sts. she does not feel bad, just frustrated with incontinence and dizziness.  Will check with RAS and call her back/fim

## 2016-06-15 NOTE — Telephone Encounter (Signed)
Pt called said she called Dr Watt Climes office [phone] and the 1st available was 06/30/16. Sts she was told if Dr Felecia Shelling called and spoke with Dr Watt Climes she could be seen sooner.

## 2016-06-15 NOTE — Telephone Encounter (Signed)
Patient called requesting to speak to Faith, has something going on this morning, ncontinence very bad, dizziness, leaning to left and catching herself from falling.

## 2016-06-17 DIAGNOSIS — A09 Infectious gastroenteritis and colitis, unspecified: Secondary | ICD-10-CM | POA: Diagnosis not present

## 2016-06-17 DIAGNOSIS — K921 Melena: Secondary | ICD-10-CM | POA: Diagnosis not present

## 2016-06-17 DIAGNOSIS — R159 Full incontinence of feces: Secondary | ICD-10-CM | POA: Diagnosis not present

## 2016-06-30 DIAGNOSIS — Z85828 Personal history of other malignant neoplasm of skin: Secondary | ICD-10-CM | POA: Diagnosis not present

## 2016-06-30 DIAGNOSIS — D485 Neoplasm of uncertain behavior of skin: Secondary | ICD-10-CM | POA: Diagnosis not present

## 2016-06-30 DIAGNOSIS — L570 Actinic keratosis: Secondary | ICD-10-CM | POA: Diagnosis not present

## 2016-06-30 DIAGNOSIS — L821 Other seborrheic keratosis: Secondary | ICD-10-CM | POA: Diagnosis not present

## 2016-07-02 DIAGNOSIS — Z961 Presence of intraocular lens: Secondary | ICD-10-CM | POA: Diagnosis not present

## 2016-07-02 DIAGNOSIS — Z0100 Encounter for examination of eyes and vision without abnormal findings: Secondary | ICD-10-CM | POA: Diagnosis not present

## 2016-07-14 DIAGNOSIS — R159 Full incontinence of feces: Secondary | ICD-10-CM | POA: Diagnosis not present

## 2016-07-30 DIAGNOSIS — R42 Dizziness and giddiness: Secondary | ICD-10-CM | POA: Diagnosis not present

## 2016-07-30 DIAGNOSIS — K529 Noninfective gastroenteritis and colitis, unspecified: Secondary | ICD-10-CM | POA: Diagnosis not present

## 2016-07-30 DIAGNOSIS — G608 Other hereditary and idiopathic neuropathies: Secondary | ICD-10-CM | POA: Diagnosis not present

## 2016-07-30 DIAGNOSIS — G35 Multiple sclerosis: Secondary | ICD-10-CM | POA: Diagnosis not present

## 2016-07-30 DIAGNOSIS — Z681 Body mass index (BMI) 19 or less, adult: Secondary | ICD-10-CM | POA: Diagnosis not present

## 2016-07-30 DIAGNOSIS — K219 Gastro-esophageal reflux disease without esophagitis: Secondary | ICD-10-CM | POA: Diagnosis not present

## 2016-07-30 DIAGNOSIS — G373 Acute transverse myelitis in demyelinating disease of central nervous system: Secondary | ICD-10-CM | POA: Diagnosis not present

## 2016-08-03 ENCOUNTER — Ambulatory Visit (INDEPENDENT_AMBULATORY_CARE_PROVIDER_SITE_OTHER): Payer: PPO | Admitting: Pulmonary Disease

## 2016-08-03 ENCOUNTER — Telehealth: Payer: Self-pay | Admitting: Pulmonary Disease

## 2016-08-03 ENCOUNTER — Encounter: Payer: Self-pay | Admitting: Pulmonary Disease

## 2016-08-03 VITALS — BP 100/64 | HR 96 | Ht 70.0 in | Wt 137.0 lb

## 2016-08-03 DIAGNOSIS — R05 Cough: Secondary | ICD-10-CM

## 2016-08-03 DIAGNOSIS — K219 Gastro-esophageal reflux disease without esophagitis: Secondary | ICD-10-CM

## 2016-08-03 DIAGNOSIS — R059 Cough, unspecified: Secondary | ICD-10-CM

## 2016-08-03 MED ORDER — RANITIDINE HCL 150 MG PO TABS: 150.0000 mg | ORAL_TABLET | Freq: Every day | ORAL | 3 refills | Status: DC

## 2016-08-03 MED ORDER — PANTOPRAZOLE SODIUM 40 MG PO TBEC
40.0000 mg | DELAYED_RELEASE_TABLET | ORAL | 3 refills | Status: DC
Start: 2016-08-03 — End: 2019-12-07

## 2016-08-03 NOTE — Telephone Encounter (Signed)
PFT 06/03/16: FVC 3.76 L (94%) FEV1 2.96 L (97%) FEV1/FVC 0.79 FEF 25-75 2.85 L (112%) no bronchodilator response TLC 5.38 L (90%) RV 69% ERV 92% DLC uncorrected 62%  IMAGING ESOPHAGRAM (05/17/16):  Normal. No hiatal hernia.  MBS (06/09/16):  Mild oral, pharyngeal, & cervical esophageal phase dysphagia. Mild aspiration risk. Possible LPR symptoms.  LABS 06/03/16 SSA:  <1.0 SSB:  <1.0 ANA:  Negative

## 2016-08-03 NOTE — Patient Instructions (Signed)
   Take your Protonix in the morning 1 pill 30 minutes before your eat.  I'm starting you on Zantac at night before bed.  Remember to call Dr. Watt Climes to see if he thinks you need any additional testing or another upper endoscopy to assess your reflux.  We are referring you to ENT to take a look at your vocal cords.  Call me if you have any questions or worsening in your cough before your next appointment.

## 2016-08-03 NOTE — Progress Notes (Signed)
Subjective:    Patient ID: [name], female    DOB: Oct 04, 1951, 65 y.o.   MRN: [id number]  C.C.:  Follow-up for Cough & GERD.  HPI Cough:  Patient did have modified barium swallow after last visit with mild aspiration risk. She reports she has has decreased frequency in her cough. She does feel like it persisting in intensity when she has it. No wheezing. Cough is still nonproductive.  GERD:  On Protonix 1-2 times per day. Reports she is compliant. No dysphagia or odynophagia. She is still having frequent reflux. She denies any morning brash water taste.   Review of Systems No fever, chills, or sweats. No chest pain or pressure. No sinus drainage, pressure, or congestion. Continues have a raspy voice.   Allergies  Allergen Reactions  . Other Shortness Of Breath    PURPLE LETTUCE  . Betaseron [Interferon Beta-1b] Other (See Comments)    HARD NODULAR AREAS AT INJECTION SITE  . Codeine Nausea And Vomiting  . Gilenya [Fingolimod Hydrochloride] Other (See Comments)    MACULAR EDEMA  . Morphine And Related Other (See Comments)    IV ROUTE - RED STREAKS OF VEIN  . Sumatriptan Nausea Only    INJECTABLE ONLY - RAPID HEART RATE, FLUSHING  . Tysabri [Natalizumab] Other (See Comments)    DEVELOPED JCV ANTIBODY  . Latex Rash    Current Outpatient Prescriptions on File Prior to Visit  Medication Sig Dispense Refill  . albuterol (PROVENTIL HFA;VENTOLIN HFA) 108 (90 Base) MCG/ACT inhaler Inhale 2 puffs into the lungs every 4 (four) hours as needed for shortness of breath. 1 Inhaler 1  . calcium carbonate (OS-CAL) 600 MG TABS tablet Take 1,200 mg by mouth.     . dalfampridine 10 MG TB12 Take 1 tablet (10 mg total) by mouth 3 (three) times daily. Dose change (Patient taking differently: Take 5 mg by mouth 3 (three) times daily. Dose change - 5 mg tid) 90 tablet 11  . diphenhydramine-acetaminophen (TYLENOL PM) 25-500 MG TABS Take 2 tablets by mouth at bedtime as needed (sleep). Reported  on 01/13/2016    . lamoTRIgine (LAMICTAL) 150 MG tablet One po bid 60 tablet 11  . loratadine (CLARITIN) 10 MG tablet Take 10 mg by mouth daily.    . Meclizine HCl 25 MG CHEW Chew 2 tablets by mouth as needed (dizziness).     . Multiple Vitamins-Minerals (MULTIVITAMIN PO) Take by mouth. Brand name - Mature Multivitamin from [employer]    . rOPINIRole (REQUIP) 0.25 MG tablet One or two at night 60 tablet 11  . Vitamin D, Ergocalciferol, (DRISDOL) 50000 UNITS CAPS capsule Take 1 capsule (50,000 Units total) by mouth every 7 (seven) days. When finished, take an otc daily vitamin d supplement of 5,000iu. (Patient taking differently: Take 50,000 Units by mouth. Take twice weekly.  WHenfinished, take an otc daily vitamin d supplement of 5,000iu.) 12 capsule 0  . [DISCONTINUED] estrogens, conjugated, (PREMARIN) 0.3 MG tablet Take 0.3 mg by mouth every other day. Take daily for 21 days then do not take for 7 days.     No current facility-administered medications on file prior to visit.     Past Medical History:  Diagnosis Date  . Arthritis    back  . Cancer ([place]) 2011  . COPD (chronic obstructive pulmonary disease) ([place])    denies SOB with ADLs  . Dental crowns present    also dental caps  . Family history of adverse reaction to anesthesia  pt's mother had hx. of post-op nausea  . Frequent falls    due to MS  . GERD (gastroesophageal reflux disease)   . Heart murmur   . 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 Migraines   . Mitral valve prolapse    states no problem  . Movement disorder   . Multiple sclerosis ([place])   . Neurogenic bladder   . Osteoporosis   . PONV (postoperative nausea and vomiting)   . Sjogren's disease ([place])    dryness of eyes, mouth  . Transverse myelitis ([place])   . Trigger thumb of left hand 12/2014    Past Surgical History:  Procedure Laterality Date  . APPENDECTOMY  06/22/2011   laparoscopic  . BREAST ENHANCEMENT SURGERY Bilateral   . BREAST IMPLANT  REMOVAL Bilateral   . BUNIONECTOMY    . CATARACT EXTRACTION Bilateral 2015  . CERVICAL SPINE SURGERY  2002; 2005   x 2 - C3-4; C4-5  . ELBOW SURGERY Right   . HEMORROIDECTOMY  08/05/2010   2-column hemorrhoidectomy  . INCONTINENCE SURGERY    . INTERCOSTAL NERVE BLOCK  2015   occipital  . OOPHORECTOMY Bilateral   . SHOULDER ARTHROSCOPY Bilateral   . TONSILLECTOMY  age 80  . TRIGGER FINGER RELEASE Left 01/16/2015   Procedure: LEFT THUMB TRIGGER RELEASE ;  Surgeon: Leanora Cover, MD;  Location: [place];  Service: Orthopedics;  Laterality: Left;  Marland Kitchen VAGINAL HYSTERECTOMY  age 21    Family History  Problem Relation Age of Onset  . Liver disease Father   . Cirrhosis Father   . Stroke Mother   . Cancer Mother     oropharyngeal  . Heart attack Mother   . Anesthesia problems Mother     post-op nausea  . Cancer Brother     twin; tonsillar?  . Cancer Sister 11    breast ca, lung ca  . Cancer Other     Nephew; appendiceal carcnoid, melanoma  . Cancer Paternal Grandmother     cervical    Social History   Social History  . Marital status: Married    Spouse name: Gershon Mussel  . Number of children: 2  . Years of education: college   Occupational History  . Retired    Social History Main Topics  . Smoking status: Passive Smoke Exposure - Never Smoker  . Smokeless tobacco: Never Used     Comment: parents, husband, & multiple family members  . Alcohol use No  . Drug use: No  . Sexual activity: Not Asked   Other Topics Concern  . None   Social History Narrative   Pt lives at home with her spouse of 60 years.   Caffeine Use- Drinks caffeine occasionally.      [facility]:   From [place]. Previously did administrative work. She currently has a cat & dog. No bird exposure. No mold or hot tub exposure. No carpet or draperies.             Objective:   Physical Exam BP 100/64 (BP Location: Left Arm, Cuff Size: Normal)   Pulse 96   Ht 5\' 10"  (1.778 m)   Wt 137 lb  (62.1 kg)   SpO2 96%   BMI 19.66 kg/m  General:  Awake. No distress. Thin, caucasian female.  Integument:  Warm & dry. No rash on exposed skin.  Lymphatics:  No appreciated cervical or supraclavicular lymphadenoapthy. HEENT:  Moist mucus membranes. Mild nasal turbinate  swelling right greater than left. No oral ulcers. Cardiovascular:  Regular rate. No edema. Normal S1 & S2.  Pulmonary:  Remains clear on auscultation. Good aeration bilaterally with normal work of breathing on room air. Patient did have one episode of brief cough with deep inspiration.  PFT 06/03/16: FVC 3.76 L (94%) FEV1 2.96 L (97%) FEV1/FVC 0.79 FEF 25-75 2.85 L (112%) no bronchodilator response TLC 5.38 L (90%) RV 69% ERV 92% DLCO uncorrected 62%  IMAGING MBS (06/09/16):  Mild oral, pharyngeal, & cervical esophageal phase dysphagia. Mild aspiration risk. Possible LPR symptoms.  BARIUM SWALLOW 05/17/16 (per radiologist): Normal esophagogram without mucosal irregularity or mass. No hiatal hernia.  CT CHEST W/O 05/17/16 (previously reviewed by me): No pleural effusion or thickening. No pericardial effusion. No pathologic mediastinal adenopathy. 4 mm right upper lobe nodule & 5 mm right lower lobe nodule.  CXR PA/LAT 04/19/16 (per radiologist): Heart normal in size. No infiltrate, edema, mass, or effusion. Bony structures unremarkable.  CT CHEST W/ 09/24/13 (previously reviewed by me): 3 mm nodule right upper lobe and 64mm right lower lobe nodule. No other nodule or opacity appreciated. No pleural effusion or thickening. No pathologic mediastinal adenopathy. No pericardial effusion.  LABS 06/03/16 SSA:  <1.0 SSB:  <1.0 ANA:  Negative  04/27/16 CBC: 4.5/13.5/43.9/265 BMP: 142/4.5/109/29/11/1.0/84/9.7 LFT: 3.7/6.4/0.3/67/16/16  02/05/15 ANA:  Negative    Assessment & Plan:  65 y.o.  female with history of Sjogren's with ongoing cough. Reviewed patient's serum lab tests from last appointment which did not indicate Sjogren's  syndrome. Patient does have ongoing reflux which I feel is contributing to her voice changes as well as her intermittent coughing. Has seen gastroenterology and had an upper endoscopy at some point in the past. I believe she would benefit from an evaluation by ENT with direct laryngoscopy. I instructed the patient to notify my office if she had any new problems breathing or worsening in her cough before next appointment.  1. Cough: Increasing gastric acid suppression. Referring to ENT for evaluation with direct laryngoscopy. 2. GERD: Continuing on Protonix by mouth every morning. Starting Zantac 150 milligrams by mouth daily at bedtime. Patient to touch base with Dr. Watt Climes to see if repeat endoscopy or other studies are necessary. 3. Follow-up: Patient to return to clinic in  6 weeks or sooner if needed.   Sonia Baller Ashok Cordia, M.D. [facility] Pulmonary & Critical Care Pager:  [phone] After 3pm or if no response, call [phone] 9:51 AM 08/03/16

## 2016-08-12 DIAGNOSIS — H8112 Benign paroxysmal vertigo, left ear: Secondary | ICD-10-CM | POA: Diagnosis not present

## 2016-08-12 DIAGNOSIS — G35 Multiple sclerosis: Secondary | ICD-10-CM | POA: Diagnosis not present

## 2016-08-12 DIAGNOSIS — R42 Dizziness and giddiness: Secondary | ICD-10-CM | POA: Diagnosis not present

## 2016-08-12 DIAGNOSIS — R490 Dysphonia: Secondary | ICD-10-CM | POA: Insufficient documentation

## 2016-08-12 DIAGNOSIS — H9113 Presbycusis, bilateral: Secondary | ICD-10-CM | POA: Diagnosis not present

## 2016-08-12 DIAGNOSIS — K219 Gastro-esophageal reflux disease without esophagitis: Secondary | ICD-10-CM | POA: Diagnosis not present

## 2016-08-12 DIAGNOSIS — IMO0002 Reserved for concepts with insufficient information to code with codable children: Secondary | ICD-10-CM | POA: Insufficient documentation

## 2016-08-12 DIAGNOSIS — J383 Other diseases of vocal cords: Secondary | ICD-10-CM | POA: Diagnosis not present

## 2016-08-18 ENCOUNTER — Encounter (HOSPITAL_COMMUNITY): Payer: Self-pay | Admitting: *Deleted

## 2016-08-18 ENCOUNTER — Other Ambulatory Visit: Payer: Self-pay | Admitting: General Surgery

## 2016-08-18 ENCOUNTER — Ambulatory Visit (HOSPITAL_COMMUNITY)
Admission: RE | Admit: 2016-08-18 | Discharge: 2016-08-18 | Disposition: A | Discharge status: Home / Self Care | Payer: PPO | Source: Ambulatory Visit | Attending: General Surgery | Admitting: General Surgery

## 2016-08-18 ENCOUNTER — Encounter (HOSPITAL_COMMUNITY)
Admission: RE | Disposition: A | Discharge status: Home / Self Care | Payer: Self-pay | Source: Ambulatory Visit | Attending: General Surgery

## 2016-08-18 DIAGNOSIS — R159 Full incontinence of feces: Secondary | ICD-10-CM | POA: Insufficient documentation

## 2016-08-18 DIAGNOSIS — G35 Multiple sclerosis: Secondary | ICD-10-CM | POA: Insufficient documentation

## 2016-08-18 DIAGNOSIS — Z9049 Acquired absence of other specified parts of digestive tract: Secondary | ICD-10-CM | POA: Diagnosis not present

## 2016-08-18 DIAGNOSIS — Z79899 Other long term (current) drug therapy: Secondary | ICD-10-CM | POA: Diagnosis not present

## 2016-08-18 DIAGNOSIS — K219 Gastro-esophageal reflux disease without esophagitis: Secondary | ICD-10-CM | POA: Insufficient documentation

## 2016-08-18 HISTORY — PX: ANAL RECTAL MANOMETRY: SHX6358

## 2016-08-18 HISTORY — PX: RECTAL ULTRASOUND: SHX2306

## 2016-08-18 SURGERY — US RECTUM

## 2016-08-18 SURGERY — MANOMETRY, ANORECTAL

## 2016-08-18 NOTE — Op Note (Signed)
08/18/2016  9:50 AM  PATIENT:  [name]  65 y.o. female  Patient Care Team: Crist Infante, MD as PCP - General (Internal Medicine) Sheilah Mins, MD as Referring Physician (Surgical Oncology)  PRE-OPERATIVE DIAGNOSIS:  fecal incontinence  POST-OPERATIVE DIAGNOSIS:  Fecal incontinence  PROCEDURE:   ANO RECTAL ULTRASOUND   Surgeon(s): Leighton Ruff, MD  ANESTHESIA:   none  EBL: none  PATIENT DISPOSITION:  PACU - hemodynamically stable.   INDICATION: 65 y.o. F with neuromuscular disorder who presents to the office with incontinence to solid stool on an almost daily basis.  Bowel diary shows an average of 2.3 episodes per day over a 3 week period.  She has tried fiber supplementation and anti-diarrheals with no noticeable difference.    OR FINDINGS: minimal external sphincter compromise, ~180 degree defect of distal internal anal sphincter  DESCRIPTION: The patient was identified in the holding area and taken to the procedure room where they were laid in lateral decubitus position on the exam room table.  A timeout was performed indicating the correct patient and procedure.  I began by performing a digital rectal exam. There was moderate resting tone. There were no abnormal masses.   The US probe was inserted without difficulty.  The levators were identified and the probe was withdrawn slowly.  The internal sphincter was identified and followed distally. There was an ~180 degree distal defect.  The external sphincter was also identified and followed distally. There was no obvious defect noted.  See below       Levators and proximal internal anal sphincter      Internal anal sphincter (distal)    Ext anal sphincter

## 2016-08-18 NOTE — H&P (Signed)
e   History of Present Illness  The patient is a 65 year old female who presents with fecal incontinence. 65 year old female with MS who presents to the office for evaluation of fecal incontinence. She states that she has approximately 15 major accidents a month. She has leakage on a daily basis. This is been going on for several years and getting worse. 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 She denies any rectal bleeding and is up-to-date on her colonoscopies. She also has some bladder issues.   Other Problems Elbert Ewings, CMA; 07/14/2016 2:36 PM) Back Pain Bladder Problems Cancer Gastroesophageal Reflux Disease General anesthesia - complications Heart murmur Hemorrhoids Migraine Headache Oophorectomy Bilateral. Other disease, cancer, significant illness  Past Surgical History Elbert Ewings, CMA; 07/14/2016 2:36 PM) Appendectomy Breast Augmentation Bilateral. Breast Biopsy Left. Cataract Surgery Bilateral. Colon Polyp Removal - Colonoscopy Foot Surgery Right. Hemorrhoidectomy Hysterectomy (not due to cancer) - Partial Oral Surgery Shoulder Surgery Bilateral. Spinal Surgery - Neck Tonsillectomy  Diagnostic Studies History  Colonoscopy 1-5 years ago Mammogram 1-3 years ago  Allergies Elbert Ewings, CMA; 07/14/2016 2:38 PM) Codeine Sulfate *ANALGESICS - OPIOID* Imitrex *MIGRAINE PRODUCTS* Morphine Sulfate *ANALGESICS - OPIOID* Latex Exam Gloves *MEDICAL DEVICES AND SUPPLIES* Rash.  Medication History Elbert Ewings, CMA; 07/14/2016 2:41 PM) Vitamin D (Ergocalciferol) (50000UNIT Capsule, Oral twice a week) Active. Pantoprazole Sodium (40MG  Tablet DR, Oral) Active. PriLOSEC (20MG  Capsule DR, Oral) Active. Ampyra (10MG  Tablet ER 12HR, Oral) Active. LamoTRIgine (100MG  Tablet, Oral) Active. ROPINIRole HCl (0.25MG  Tablet, Oral) Active. Prolixin (2.5MG /ML Solution, Injection twice yearly)  Active. Loratadine (10MG  Tablet, Oral) Active. Calcium Gluconate (600MG  Tablet, Oral) Active. Mature Adult Century (Oral) Active. Flonase (50MCG/ACT Suspension, Nasal) Active. Colestipol HCl (1GM Tablet, Oral) Active. Align (Oral) Active.  Social History  No alcohol use No caffeine use No drug use Tobacco use Never smoker.  Family History  Alcohol Abuse Brother, Mother. Breast Cancer Sister. Cancer Brother, Mother, Sister. Depression Sister. Heart Disease Mother. Hypertension Brother, Mother. Migraine Headache Daughter, Sister. Respiratory Condition Mother.  Pregnancy / Birth History  Age at menarche 13 years. Age of menopause <46 Contraceptive History Oral contraceptives. Gravida 3 Maternal age 67-25 Para 2    Review of Systems  General Present- Fatigue. Not Present- Appetite Loss, Chills, Fever, Night Sweats, Weight Gain and Weight Loss. Skin Not Present- Change in Wart/Mole, Dryness, Hives, Jaundice, New Lesions, Non-Healing Wounds, Rash and Ulcer. HEENT Present- Hearing Loss, Ringing in the Ears, Seasonal Allergies, Visual Disturbances and Wears glasses/contact lenses. Not Present- Earache, Hoarseness, Nose Bleed, Oral Ulcers, Sinus Pain, Sore Throat and Yellow Eyes. Respiratory Present- Chronic Cough. Not Present- Bloody sputum, Difficulty Breathing, Snoring and Wheezing. Breast Not Present- Breast Mass, Breast Pain, Nipple Discharge and Skin Changes. Cardiovascular Present- Shortness of Breath. Not Present- Chest Pain, Difficulty Breathing Lying Down, Leg Cramps, Palpitations, Rapid Heart Rate and Swelling of Extremities. Gastrointestinal Present- Abdominal Pain, Bloating, Difficulty Swallowing, Excessive gas, Hemorrhoids, Indigestion and Rectal Pain. Not Present- Bloody Stool, Change in Bowel Habits, Chronic diarrhea, Constipation, Gets full quickly at meals, Nausea and Vomiting. Female Genitourinary Present- Frequency, Painful Urination and  Urgency. Not Present- Nocturia and Pelvic Pain. Musculoskeletal Present- Back Pain, Joint Stiffness and Muscle Weakness. Not Present- Joint Pain, Muscle Pain and Swelling of Extremities. Neurological Present- Headaches, Numbness, Tingling, Trouble walking and Weakness. Not Present- Decreased Memory, Fainting, Seizures and Tremor. Psychiatric Not Present- Anxiety, Bipolar, Change in Sleep Pattern, Depression, Fearful and Frequent crying. Endocrine Present- Cold Intolerance and Heat  Intolerance. Not Present- Excessive Hunger, Hair Changes, Hot flashes and New Diabetes. Hematology Not Present- Blood Thinners, Easy Bruising, Excessive bleeding, Gland problems, HIV and Persistent Infections.  There were no vitals taken for this visit.   Physical Exam  General Mental Status-Alert. General Appearance-Not in acute distress. Build & Nutrition-Well nourished. Posture-Normal posture. Gait-Normal.  Head and Neck Head-normocephalic, atraumatic with no lesions or palpable masses. Trachea-midline.  Chest and Lung Exam Chest and lung exam reveals -on auscultation, normal breath sounds, no adventitious sounds and normal vocal resonance.  Cardiovascular Cardiovascular examination reveals -normal heart sounds, regular rate and rhythm with no murmurs.  Abdomen Inspection Inspection of the abdomen reveals - No Hernias. Palpation/Percussion Palpation and Percussion of the abdomen reveal - Soft, Non Tender, No Rigidity (guarding), No hepatosplenomegaly and No Palpable abdominal masses.  Rectal Anorectal Exam External - Note: Gaping noted, moderate rectal tone. Internal - Note: Very weak squeeze pressure, anterior defect noted, normal push.   ANOSCOPY, DIAGNOSTIC [id number]) [ Hemorrhoids ] Procedure Other: Procedure: Anoscopy Surgeon: Marcello Moores After the risks and benefits were explained, verbal consent was obtained for above procedure. A medical assistant chaperone was present  thoroughout the entire procedure. 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 We will have her do a bowel diary for the next couple weeks. We will do an anal manometry and anal ultrasound. Once this is done we will discuss whether she is a good candidate for sacral nerve stimulator. I briefly discussed this process with her as well as all other options for surgical correction. All questions were answered. Current Plans Anal manometry and Anal [place] today.

## 2016-08-18 NOTE — Progress Notes (Signed)
Anal Manometry done per protocol. Pt tolerated well without complications. Pt having rectal ultrasound with Dr. Marcello Moores after manometry.

## 2016-08-18 NOTE — Discharge Instructions (Signed)
Post Colonoscopy Instructions  1. DIET: Follow your regular diet 2. You may have some mild rectal bleeding for the first few days after the procedure.  This should get less and less with time.  Resume any blood thinners 2 days after your procedure unless directed otherwise by your physician. 3. Take your usually prescribed home medications unless otherwise directed. a. If you have any pain, it is helpful to get up and walk around, as it is usually from excess gas. b. If this is not helpful, you can take an over-the-counter pain medication.  Choose one of the following that works best for you: i. Naproxen (Aleve, etc)  Two 220mg  tabs twice a day ii. Ibuprofen (Advil, etc) Three 200mg  tabs four times a day (every meal & bedtime) iii. If you still have pain after using one of these, please call the office 4. It is normal to not have a bowel movement for 2-3 days after colonoscopy.    5. ACTIVITIES as tolerated:   6. You may resume regular daily activities beginning the next day--such as daily self-care, walking, climbing stairs--gradually increasing activities as tolerated.    WHEN TO CALL us [phone]: 1. Fever over 101.5 F (38.5 C)  2. Severe abdominal or chest pain  3. Large amount of rectal bleeding, passing multiple blood clots  4. Dizziness or shortness of breath 5. Increasing nausea or vomiting   The clinic staff is available to answer your questions during regular business hours (8:30am-5pm).  Please dont hesitate to call and ask to speak to one of our nurses for clinical concerns.   If you have a medical emergency, go to the nearest emergency room or call 911.  A surgeon from [facility] is always on call at the [facility], [place], [place], [place], [place]  [address] ? MAIN: [phone] ? TOLL FREE: [phone] ?  FAX [phone] [email or web address]

## 2016-08-19 ENCOUNTER — Encounter (HOSPITAL_COMMUNITY): Payer: Self-pay | Admitting: General Surgery

## 2016-08-23 ENCOUNTER — Encounter (HOSPITAL_COMMUNITY): Payer: Self-pay | Admitting: General Surgery

## 2016-09-14 ENCOUNTER — Ambulatory Visit: Payer: PPO | Admitting: Pulmonary Disease

## 2016-09-23 ENCOUNTER — Encounter (HOSPITAL_BASED_OUTPATIENT_CLINIC_OR_DEPARTMENT_OTHER): Payer: Self-pay | Admitting: *Deleted

## 2016-09-23 NOTE — Progress Notes (Signed)
NPO AFTER MN.  ARRIVE AT 0600.  NEEDS HG.  WILL TAKE AM MEDS W/ SIPS OF WATER DOS.  WILL DO HIBICLENS SHOWER HS BEFORE AND AM DOS.

## 2016-09-25 DIAGNOSIS — Z23 Encounter for immunization: Secondary | ICD-10-CM | POA: Diagnosis not present

## 2016-09-28 ENCOUNTER — Ambulatory Visit (HOSPITAL_BASED_OUTPATIENT_CLINIC_OR_DEPARTMENT_OTHER)
Admission: RE | Admit: 2016-09-28 | Discharge: 2016-09-28 | Disposition: A | Discharge status: Home / Self Care | Payer: PPO | Source: Ambulatory Visit | Attending: General Surgery | Admitting: General Surgery

## 2016-09-28 ENCOUNTER — Encounter (HOSPITAL_BASED_OUTPATIENT_CLINIC_OR_DEPARTMENT_OTHER)
Admission: RE | Disposition: A | Discharge status: Home / Self Care | Payer: Self-pay | Source: Ambulatory Visit | Attending: General Surgery

## 2016-09-28 ENCOUNTER — Other Ambulatory Visit: Payer: Self-pay

## 2016-09-28 ENCOUNTER — Ambulatory Visit (HOSPITAL_COMMUNITY): Payer: PPO

## 2016-09-28 ENCOUNTER — Ambulatory Visit (HOSPITAL_BASED_OUTPATIENT_CLINIC_OR_DEPARTMENT_OTHER): Payer: PPO | Admitting: Anesthesiology

## 2016-09-28 ENCOUNTER — Ambulatory Visit (HOSPITAL_BASED_OUTPATIENT_CLINIC_OR_DEPARTMENT_OTHER): Admission: RE | Admit: 2016-09-28 | Payer: PPO | Source: Ambulatory Visit | Admitting: General Surgery

## 2016-09-28 ENCOUNTER — Encounter (HOSPITAL_BASED_OUTPATIENT_CLINIC_OR_DEPARTMENT_OTHER): Payer: Self-pay | Admitting: *Deleted

## 2016-09-28 DIAGNOSIS — K640 First degree hemorrhoids: Secondary | ICD-10-CM | POA: Diagnosis not present

## 2016-09-28 DIAGNOSIS — R5383 Other fatigue: Secondary | ICD-10-CM | POA: Diagnosis not present

## 2016-09-28 DIAGNOSIS — M1990 Unspecified osteoarthritis, unspecified site: Secondary | ICD-10-CM | POA: Insufficient documentation

## 2016-09-28 DIAGNOSIS — J449 Chronic obstructive pulmonary disease, unspecified: Secondary | ICD-10-CM | POA: Diagnosis not present

## 2016-09-28 DIAGNOSIS — K219 Gastro-esophageal reflux disease without esophagitis: Secondary | ICD-10-CM | POA: Diagnosis not present

## 2016-09-28 DIAGNOSIS — G35 Multiple sclerosis: Secondary | ICD-10-CM | POA: Insufficient documentation

## 2016-09-28 DIAGNOSIS — R159 Full incontinence of feces: Secondary | ICD-10-CM | POA: Insufficient documentation

## 2016-09-28 DIAGNOSIS — Z79899 Other long term (current) drug therapy: Secondary | ICD-10-CM | POA: Insufficient documentation

## 2016-09-28 DIAGNOSIS — Z419 Encounter for procedure for purposes other than remedying health state, unspecified: Secondary | ICD-10-CM

## 2016-09-28 DIAGNOSIS — Z462 Encounter for fitting and adjustment of other devices related to nervous system and special senses: Secondary | ICD-10-CM | POA: Diagnosis not present

## 2016-09-28 HISTORY — DX: Shortness of breath: R06.02

## 2016-09-28 HISTORY — DX: Other disturbances of skin sensation: R20.8

## 2016-09-28 HISTORY — DX: Presence of other specified devices: Z97.8

## 2016-09-28 HISTORY — DX: Personal history of other specified conditions: Z87.898

## 2016-09-28 HISTORY — DX: Paralytic gait: R26.1

## 2016-09-28 HISTORY — DX: Personal history of colon polyps, unspecified: Z86.0100

## 2016-09-28 HISTORY — DX: Full incontinence of feces: R15.9

## 2016-09-28 HISTORY — DX: Personal history of colonic polyps: Z86.010

## 2016-09-28 HISTORY — DX: Solitary pulmonary nodule: R91.1

## 2016-09-28 HISTORY — DX: Other fatigue: R53.83

## 2016-09-28 LAB — POCT HEMOGLOBIN-HEMACUE: Hemoglobin: 12.7 g/dL (ref 12.0–15.0)

## 2016-09-28 SURGERY — INSERTION, NEUROSTIMULATOR, SACRAL
Anesthesia: Monitor Anesthesia Care | Site: Back

## 2016-09-28 MED ORDER — FENTANYL CITRATE (PF) 100 MCG/2ML IJ SOLN
INTRAMUSCULAR | Status: AC
  Filled 2016-09-28: qty 2

## 2016-09-28 MED ORDER — FENTANYL CITRATE (PF) 100 MCG/2ML IJ SOLN
25.0000 ug | INTRAMUSCULAR | Status: DC | PRN
  Filled 2016-09-28: qty 1

## 2016-09-28 MED ORDER — ONDANSETRON HCL 4 MG/2ML IJ SOLN
INTRAMUSCULAR | Status: DC | PRN
  Administered 2016-09-28: 4 mg via INTRAVENOUS

## 2016-09-28 MED ORDER — ACETAMINOPHEN 325 MG PO TABS
650.0000 mg | ORAL_TABLET | ORAL | Status: DC | PRN
  Filled 2016-09-28: qty 2

## 2016-09-28 MED ORDER — CHLORHEXIDINE GLUCONATE CLOTH 2 % EX PADS
6.0000 | MEDICATED_PAD | Freq: Once | CUTANEOUS | Status: DC
  Filled 2016-09-28: qty 6

## 2016-09-28 MED ORDER — SODIUM CHLORIDE 0.9% FLUSH
3.0000 mL | INTRAVENOUS | Status: DC | PRN
  Filled 2016-09-28: qty 3

## 2016-09-28 MED ORDER — SODIUM CHLORIDE 0.9 % IV SOLN
250.0000 mL | INTRAVENOUS | Status: DC | PRN
  Filled 2016-09-28: qty 250

## 2016-09-28 MED ORDER — MIDAZOLAM HCL 2 MG/2ML IJ SOLN
INTRAMUSCULAR | Status: AC
  Filled 2016-09-28: qty 2

## 2016-09-28 MED ORDER — LACTATED RINGERS IV SOLN
INTRAVENOUS | Status: DC
  Filled 2016-09-28: qty 1000

## 2016-09-28 MED ORDER — ONDANSETRON HCL 4 MG/2ML IJ SOLN
INTRAMUSCULAR | Status: AC
  Filled 2016-09-28: qty 2

## 2016-09-28 MED ORDER — MIDAZOLAM HCL 5 MG/5ML IJ SOLN
INTRAMUSCULAR | Status: DC | PRN
  Administered 2016-09-28: 2 mg via INTRAVENOUS

## 2016-09-28 MED ORDER — DEXAMETHASONE SODIUM PHOSPHATE 10 MG/ML IJ SOLN
INTRAMUSCULAR | Status: AC
  Filled 2016-09-28: qty 1

## 2016-09-28 MED ORDER — SODIUM CHLORIDE 0.9% FLUSH
3.0000 mL | Freq: Two times a day (BID) | INTRAVENOUS | Status: DC
  Filled 2016-09-28: qty 3

## 2016-09-28 MED ORDER — CEFAZOLIN SODIUM-DEXTROSE 2-4 GM/100ML-% IV SOLN
2.0000 g | INTRAVENOUS | Status: AC
  Administered 2016-09-28: 2 g via INTRAVENOUS
  Filled 2016-09-28: qty 100

## 2016-09-28 MED ORDER — MEPERIDINE HCL 25 MG/ML IJ SOLN
6.2500 mg | INTRAMUSCULAR | Status: DC | PRN
  Filled 2016-09-28: qty 1

## 2016-09-28 MED ORDER — LIDOCAINE 2% (20 MG/ML) 5 ML SYRINGE
INTRAMUSCULAR | Status: AC
Start: 2016-09-28 — End: 2016-09-28
  Filled 2016-09-28: qty 10

## 2016-09-28 MED ORDER — KETAMINE HCL 10 MG/ML IJ SOLN
INTRAMUSCULAR | Status: AC
  Filled 2016-09-28: qty 1

## 2016-09-28 MED ORDER — ACETAMINOPHEN 500 MG PO TABS
ORAL_TABLET | ORAL | Status: AC
  Filled 2016-09-28: qty 2

## 2016-09-28 MED ORDER — PROPOFOL 500 MG/50ML IV EMUL
INTRAVENOUS | Status: AC
  Filled 2016-09-28: qty 100

## 2016-09-28 MED ORDER — CEFAZOLIN SODIUM-DEXTROSE 2-4 GM/100ML-% IV SOLN
INTRAVENOUS | Status: AC
  Filled 2016-09-28: qty 100

## 2016-09-28 MED ORDER — PROPOFOL 500 MG/50ML IV EMUL
INTRAVENOUS | Status: DC | PRN
  Administered 2016-09-28: 200 ug/kg/min via INTRAVENOUS

## 2016-09-28 MED ORDER — BUPIVACAINE-EPINEPHRINE 0.5% -1:200000 IJ SOLN
INTRAMUSCULAR | Status: DC | PRN
  Administered 2016-09-28: 30 mL

## 2016-09-28 MED ORDER — LIDOCAINE 2% (20 MG/ML) 5 ML SYRINGE
INTRAMUSCULAR | Status: DC | PRN
  Administered 2016-09-28: 50 mg via INTRAVENOUS

## 2016-09-28 MED ORDER — ACETAMINOPHEN 500 MG PO TABS
1000.0000 mg | ORAL_TABLET | ORAL | Status: AC
  Administered 2016-09-28: 1000 mg via ORAL
  Filled 2016-09-28: qty 2

## 2016-09-28 MED ORDER — PROMETHAZINE HCL 25 MG/ML IJ SOLN
6.2500 mg | INTRAMUSCULAR | Status: DC | PRN
  Filled 2016-09-28: qty 1

## 2016-09-28 MED ORDER — FENTANYL CITRATE (PF) 100 MCG/2ML IJ SOLN
INTRAMUSCULAR | Status: DC | PRN
  Administered 2016-09-28 (×4): 25 ug via INTRAVENOUS

## 2016-09-28 MED ORDER — ACETAMINOPHEN 650 MG RE SUPP
650.0000 mg | RECTAL | Status: DC | PRN
  Filled 2016-09-28: qty 1

## 2016-09-28 MED ORDER — LACTATED RINGERS IV SOLN
INTRAVENOUS | Status: DC
  Administered 2016-09-28: 07:00:00 via INTRAVENOUS
  Filled 2016-09-28: qty 1000

## 2016-09-28 MED ORDER — DEXAMETHASONE SODIUM PHOSPHATE 4 MG/ML IJ SOLN
INTRAMUSCULAR | Status: DC | PRN
  Administered 2016-09-28: 10 mg via INTRAVENOUS

## 2016-09-28 MED ORDER — FAMOTIDINE IN NACL 20-0.9 MG/50ML-% IV SOLN
INTRAVENOUS | Status: AC
  Filled 2016-09-28: qty 50

## 2016-09-28 SURGICAL SUPPLY — 63 items
BAG URINE LEG 500ML (DRAIN) IMPLANT
BENZOIN TINCTURE PRP APPL 2/3 (GAUZE/BANDAGES/DRESSINGS) ×3 IMPLANT
BLADE HEX COATED 2.75 (ELECTRODE) ×3 IMPLANT
BLADE SURG 11 STRL SS (BLADE) ×3 IMPLANT
BLADE SURG 15 STRL LF DISP TIS (BLADE) ×1 IMPLANT
BLADE SURG 15 STRL SS (BLADE) ×2
CABLE TEST STIMULATION (UROLOGICAL SUPPLIES) ×3 IMPLANT
CABLE TWIST LOCK 25CM (UROLOGICAL SUPPLIES) ×3 IMPLANT
CATH FOLEY 2WAY SLVR  5CC 16FR (CATHETERS)
CATH FOLEY 2WAY SLVR 5CC 16FR (CATHETERS) IMPLANT
CHLORAPREP W/TINT 26ML (MISCELLANEOUS) ×3 IMPLANT
CLOSURE WOUND 1/2 X4 (GAUZE/BANDAGES/DRESSINGS) ×1
COVER BACK TABLE 60X90IN (DRAPES) ×3 IMPLANT
COVER MAYO STAND STRL (DRAPES) ×3 IMPLANT
COVER PROBE W GEL 5X96 (DRAPES) IMPLANT
DRAPE C-ARM 42X72 X-RAY (DRAPES) ×3 IMPLANT
DRAPE INCISE IOBAN 66X45 STRL (DRAPES) ×3 IMPLANT
DRAPE LAPAROSCOPIC ABDOMINAL (DRAPES) ×3 IMPLANT
DRAPE LG THREE QUARTER DISP (DRAPES) ×3 IMPLANT
DRAPE UTILITY XL STRL (DRAPES) ×3 IMPLANT
DRSG TEGADERM 4X4.75 (GAUZE/BANDAGES/DRESSINGS) ×3 IMPLANT
ELECT REM PT RETURN 9FT ADLT (ELECTROSURGICAL) ×3
ELECTRODE REM PT RTRN 9FT ADLT (ELECTROSURGICAL) ×1 IMPLANT
GLOVE BIO SURGEON STRL SZ 6.5 (GLOVE) IMPLANT
GLOVE BIO SURGEONS STRL SZ 6.5 (GLOVE)
GLOVE BIOGEL PI IND STRL 6.5 (GLOVE) ×3 IMPLANT
GLOVE BIOGEL PI IND STRL 7.0 (GLOVE) ×1 IMPLANT
GLOVE BIOGEL PI IND STRL 7.5 (GLOVE) ×3 IMPLANT
GLOVE BIOGEL PI INDICATOR 6.5 (GLOVE) ×6
GLOVE BIOGEL PI INDICATOR 7.0 (GLOVE) ×2
GLOVE BIOGEL PI INDICATOR 7.5 (GLOVE) ×6
GLOVE INDICATOR 7.0 STRL GRN (GLOVE) ×3 IMPLANT
GOWN STRL REUS W/TWL 2XL LVL3 (GOWN DISPOSABLE) ×6 IMPLANT
GOWN STRL REUS W/TWL LRG LVL3 (GOWN DISPOSABLE) ×3 IMPLANT
INTRODUCER GUIDE DILATR SHEATH (SET/KITS/TRAYS/PACK) ×3 IMPLANT
KIT INTERSTIM LEAD TINED 28CM (Urological Implant) ×3 IMPLANT
KIT ROOM TURNOVER WOR (KITS) ×3 IMPLANT
LIQUID BAND (GAUZE/BANDAGES/DRESSINGS) ×3 IMPLANT
MANIFOLD NEPTUNE II (INSTRUMENTS) IMPLANT
NEEDLE FORAMEN 20GA 3.5  9CM (NEEDLE) IMPLANT
NEEDLE FORAMEN 20GA 5  12.5CM (NEEDLE) IMPLANT
NEEDLE HYPO 22GX1.5 SAFETY (NEEDLE) ×3 IMPLANT
NEUROSTIMULATOR 1.7X2X.06 (UROLOGICAL SUPPLIES) ×3 IMPLANT
NS IRRIG 1000ML POUR BTL (IV SOLUTION) IMPLANT
PACK BASIN DAY SURGERY FS (CUSTOM PROCEDURE TRAY) ×3 IMPLANT
PAD ARMBOARD 7.5X6 YLW CONV (MISCELLANEOUS) IMPLANT
PENCIL BUTTON HOLSTER BLD 10FT (ELECTRODE) ×3 IMPLANT
PROGRAMMER ANTENNA EXT (UROLOGICAL SUPPLIES) IMPLANT
PROGRAMMER STIMUL 2.2X1.1X3.7 (UROLOGICAL SUPPLIES) IMPLANT
SPONGE GAUZE 4X4 12PLY STER LF (GAUZE/BANDAGES/DRESSINGS) ×3 IMPLANT
STAPLER VISISTAT 35W (STAPLE) IMPLANT
STRIP CLOSURE SKIN 1/2X4 (GAUZE/BANDAGES/DRESSINGS) ×2 IMPLANT
SUT SILK 2 0 TIES 17X18 (SUTURE)
SUT SILK 2-0 18XBRD TIE BLK (SUTURE) IMPLANT
SUT VIC AB 3-0 SH 27 (SUTURE) ×2
SUT VIC AB 3-0 SH 27X BRD (SUTURE) ×1 IMPLANT
SUT VICRYL 4-0 PS2 18IN ABS (SUTURE) ×3 IMPLANT
SYR BULB IRRIGATION 50ML (SYRINGE) ×3 IMPLANT
SYR CONTROL 10ML LL (SYRINGE) ×3 IMPLANT
TOWEL OR 17X24 6PK STRL BLUE (TOWEL DISPOSABLE) ×6 IMPLANT
TUBE CONNECTING 12'X1/4 (SUCTIONS)
TUBE CONNECTING 12X1/4 (SUCTIONS) IMPLANT
WATER STERILE IRR 500ML POUR (IV SOLUTION) ×3 IMPLANT

## 2016-09-28 NOTE — Anesthesia Preprocedure Evaluation (Addendum)
Anesthesia Evaluation  Patient identified by MRN, date of birth, ID band Patient awake    Reviewed: Allergy & Precautions, NPO status , Patient's Chart, lab work & pertinent test results  History of Anesthesia Complications (+) PONV and history of anesthetic complications  Airway Mallampati: I  TM Distance: >3 FB Neck ROM: Full    Dental  (+) Teeth Intact, Dental Advisory Given   Pulmonary COPD,    breath sounds clear to auscultation       Cardiovascular negative cardio ROS   Rhythm:Regular Rate:Normal     Neuro/Psych  Headaches, negative psych ROS   GI/Hepatic Neg liver ROS, GERD  Medicated and Controlled,  Endo/Other  negative endocrine ROS  Renal/GU negative Renal ROS  negative genitourinary   Musculoskeletal  (+) Arthritis , Osteoarthritis,    Abdominal   Peds negative pediatric ROS (+)  Hematology negative hematology ROS (+)   Anesthesia Other Findings   Reproductive/Obstetrics negative OB ROS                           09/2016 EKG: normal sinus rhythm.  Anesthesia Physical Anesthesia Plan  ASA: III  Anesthesia Plan: MAC   Post-op Pain Management:    Induction: Intravenous  Airway Management Planned: Natural Airway  Additional Equipment:   Intra-op Plan:   Post-operative Plan:   Informed Consent: I have reviewed the patients History and Physical, chart, labs and discussed the procedure including the risks, benefits and alternatives for the proposed anesthesia with the patient or authorized representative who has indicated his/her understanding and acceptance.     Plan Discussed with: CRNA  Anesthesia Plan Comments: (Famotidine given in PREOP)       Anesthesia Quick Evaluation

## 2016-09-28 NOTE — Anesthesia Postprocedure Evaluation (Signed)
Anesthesia Post Note  Patient: Alzora Eulberg Vanleeuwen  Procedure(s) Performed: Procedure(s) (LRB): INSERTION SACRAL NERVE STIMULATOR (N/A)  Patient location during evaluation: PACU Anesthesia Type: MAC Level of consciousness: awake and alert Pain management: pain level controlled Vital Signs Assessment: post-procedure vital signs reviewed and stable Respiratory status: spontaneous breathing, nonlabored ventilation, respiratory function stable and patient connected to nasal cannula oxygen Cardiovascular status: stable and blood pressure returned to baseline Anesthetic complications: no    Last Vitals:  Vitals:   09/28/16 0930 09/28/16 0940  BP: 104/60   Pulse: 76 77  Resp: 16 12  Temp:      Last Pain:  Vitals:   09/28/16 0626  TempSrc: Oral                 Effie Berkshire

## 2016-09-28 NOTE — Discharge Instructions (Addendum)
°  Post Anesthesia Home Care Instructions  Activity: Get plenty of rest for the remainder of the day. A responsible adult should stay with you for 24 hours following the procedure.  For the next 24 hours, DO NOT: -Drive a car -Paediatric nurse -Drink alcoholic beverages -Take any medication unless instructed by your physician -Make any legal decisions or sign important papers.  Meals: Start with liquid foods such as gelatin or soup. Progress to regular foods as tolerated. Avoid greasy, spicy, heavy foods. If nausea and/or vomiting occur, drink only clear liquids until the nausea and/or vomiting subsides. Call your physician if vomiting continues.  Special Instructions/Symptoms: Your throat may feel dry or sore from the anesthesia or the breathing tube placed in your throat during surgery. If this causes discomfort, gargle with warm salt water. The discomfort should disappear within 24 hours.  If you had a scopolamine patch placed behind your ear for the management of post- operative nausea and/or vomiting:  1. The medication in the patch is effective for 72 hours, after which it should be removed.  Wrap patch in a tissue and discard in the trash. Wash hands thoroughly with soap and water. 2. You may remove the patch earlier than 72 hours if you experience unpleasant side effects which may include dry mouth, dizziness or visual disturbances. 3. Avoid touching the patch. Wash your hands with soap and water after contact with the patch.   POST OP INSTRUCTIONS  Always review your discharge instruction sheet given to you by the facility where your surgery was performed.   1. You make take acetaminophen (Tylenol) or ibuprofen (Advil) as needed.  2. Take your usually prescribed medications unless otherwise directed. 3. If you need a stronger pain medication, please contact our office during regular business hours. 4. You should follow a light diet for the remainder of the day after your  procedure. 5. Most patients will experience some mild swelling and/or bruising in the area of the incision. It may take several days to resolve. 6. It is common to experience some constipation if taking pain medication after surgery. Increasing fluid intake and taking a stool softener (such as Colace) will usually help or prevent this problem from occurring. A mild laxative (Milk of Magnesia or Miralax) should be taken according to package directions if there are no bowel movements after 48 hours.  7. Leave all dressings in place.  Do not shower, only sponge baths.  8. ACTIVITIES:  Limit activity for the next 72 hours. Do no strenuous exercise or activity for 1 week. You may drive when you are no longer taking prescription pain medication, you can comfortably wear a seatbelt, and you can maneuver your car. 10.You may need to see your doctor in the office for a follow-up appointment.  Please       check with your doctor.    WHEN TO CALL YOUR DOCTOR [phone]): 1. Fever over 101.0 2. Chills 3. Continued bleeding from incision 4. Increased redness and tenderness at the site 5. Shortness of breath, difficulty breathing   The clinic staff is available to answer your questions during regular business hours. Please dont hesitate to call and ask to speak to one of the nurses or medical assistants for clinical concerns. If you have a medical emergency, go to the nearest emergency room or call 911.  A surgeon from [facility] is always on call at the hospital.     For further information, please visit [email or web address]

## 2016-09-28 NOTE — Op Note (Signed)
09/28/2016  8:55 AM  PATIENT:  [name]  65 y.o. female  Patient Care Team: Crist Infante, MD as PCP - General (Internal Medicine) Sheilah Mins, MD as Referring Physician (Surgical Oncology)  PRE-OPERATIVE DIAGNOSIS:  fecal incontinence  POST-OPERATIVE DIAGNOSIS:  fecal incontinence  PROCEDURE:   INSERTION SACRAL NERVE STIMULATOR TEST WIRE   Surgeon(s): Leighton Ruff, MD  ASSISTANT: none   ANESTHESIA:   local and MAC  EBL:  Total I/O In: 900 [I.V.:900] Out: 300 [Urine:300]  DRAINS: none   SPECIMEN:  No Specimen  DISPOSITION OF SPECIMEN:  N/A  COUNTS:  YES  PLAN OF CARE: Discharge to home after PACU  PATIENT DISPOSITION:  PACU - hemodynamically stable.  INDICATION: Fecal incontinence refractory to medical treatment.  The patient suffers from fecal incontinence ~ 2xs per day.  she has tried anti-diarrheal and fiber therapies as well as dietary modifications to manage this without success.  Anal manometry shows weak resting tone and minimal squeeze pressure.  Anal US shows an intact external sphincter and a small distal defect in the internal sphincter.  The risks and benefits of the surgery were described to the patient and consent was signed and placed on chart prior to the OR.  DESCRIPTION: the patient was identified in the preoperative holding area and taken to the OR where they were laid prone on the operating room table.  MAC anesthesia was induced without difficulty. SCDs were also noted to be in place prior to the initiation of anesthesia.  Pillows were placed under lower abdomen to flatten the sacrum and under shins to allow the toes to dangle freely. A ground pad was placed on the bottom of the patient's foot and the proximal ends of the j-hook patient cable were connected to the ground pad and the external neurostimulator (ENS).The patient was then prepped and draped in the usual sterile fashion.  A surgical timeout was performed indicating the correct  patient, procedure, positioning and need for preoperative antibiotics.  The c-arm was moved into AP position to provide fluoroscopic guidance of the sacrum. The medial edges of the foramina were identified and marked. The c-arm was then moved into the lateral position to identify the S3 foramen. Once the needle entry point was determined, local injection of 0.5% Marciane was administered bilaterally. A foramen needle was placed in the superior, medial aspect of the right S3 foramen and appropriate needle depth was visualized utilizing fluoroscopy. We could not get proper signals in this location, so we switched to the left S3 foramen.  The needle was placed in similar technique.  Proper S3 needle location was also confirmed by direct observation of the lifting of the perineum or "bellowing," and plantar flexion of the great toe utilizing the j-hook patient cable, the external neurostimulator and Verify controller.  The foramen needle stylet was removed and a directional guide was placed through the needle using markers on the guide to assure appropriate depth. The foramen needle was removed by sliding over the directional guide. A small incision was made peripherally to the directional guide through the skin. The lead introducer with dilator was placed over the directional guide and utilizing fluoroscopic guidance, the lead introducer was advanced until the radiopaque mark was half-way through the foramen. The dilator was removed along with the directional guide. Using fluoroscopy, the tined lead with bent stylet was placed through the introducer until electrodes two and three straddled the anterior surface of the sacrum. All four electrodes were tested, observing "bellows" and  plantar flexion of the great toe utilizing the j-hook patient cable and the external neurostimulator with Verify controller. After satisfactory lead positioning was confirmed, the introducer was retracted over the lead under continuous  fluoroscopy, deploying the tines into presacral tissue. Retesting of all four electrodes confirming appropriate responses was completed.   The potential internal neurostimulator pocket site was identified below the iliac crest and lateral to the sacrum. Local anesthesia was administered and an incision was made into the subcutaneous tissue creating a connection site. Blunt dissection was used to create a small pocket with hemostasis achieved.  A tunneling tool with sheath was placed from the lead exit site subcutaneously to the small incised pocket site. The tunneling tool was removed and the lead was fed through the sheath, exiting at the pocket connection site. The sheath was removed. The lead was cleaned and dried. A protective boot was placed over the lead; the lead was inserted into the temporary percutaneous extension with visual confirmation of blue tip advancement. The four setscrews were tightened with the torque wrench until audible clicks were heard. The boot was slid over the connection and 2-0 silk ties were sutured to the boot grooves on either side of connection. Using the tunneling tool and sheath, a subcutaneous tunnel was created from the pocket site to the contralateral buttock and exited at a localized site. The percutaneous extension was placed through the sheath, the sheath removed, and the extension exiting the site.  The connection components were placed into the incision. The incisions were closed with 3-0 subcuticular Vicryl sutures and 4-0 Vicryl skin sutures. The incisions were closed with Dermabond.  Adhesive strips and gauze were placed over the percutaneous extension exit site and secured with transparent dressing.   The percutaneous extension was attached to the external white twist lock cable. Gauze was placed under the twist-lock connection with cable strain relief and secured using transparent dressing. The twist lock cable was then plugged into the external  neuorstimulator. The patient was transferred to PACU in satisfactory condition. Using the Verify controller and external neurostimulator, the patient was programmed to the electrode of optimum sensation and provided utilization instructions prior to discharge. Patient will complete a bowel diary during testing period to help document results of this test procedure.

## 2016-09-28 NOTE — Transfer of Care (Signed)
Immediate Anesthesia Transfer of Care Note  Patient: [name]  Procedure(s) Performed: Procedure(s): INSERTION SACRAL NERVE STIMULATOR (N/A)  Patient Location: PACU  Anesthesia Type:MAC  Level of Consciousness: awake, alert , oriented and patient cooperative  Airway & Oxygen Therapy: Patient Spontanous Breathing and Patient connected to nasal cannula oxygen  Post-op Assessment: Report given to RN and Post -op Vital signs reviewed and stable  Post vital signs: Reviewed and stable  Last Vitals:  Vitals:   09/28/16 0626  BP: 126/70  Pulse: 72  Resp: 16  Temp: 36.9 C    Last Pain:  Vitals:   09/28/16 0626  TempSrc: Oral      Patients Stated Pain Goal: 5 ([id number])  Complications: No apparent anesthesia complications

## 2016-09-28 NOTE — Anesthesia Procedure Notes (Signed)
Procedure Name: MAC Date/Time: 09/28/2016 7:43 AM Performed by: Wanita Chamberlain Pre-anesthesia Checklist: Patient identified, Timeout performed, Emergency Drugs available, Suction available and Patient being monitored Patient Re-evaluated:Patient Re-evaluated prior to inductionOxygen Delivery Method: Circle system utilized Intubation Type: IV induction Placement Confirmation: CO2 detector Dental Injury: Teeth and Oropharynx as per pre-operative assessment

## 2016-09-28 NOTE — H&P (Signed)
History of Present Illness  The patient is a 65 year old female who presents with fecal incontinence. 65 year old female with MS who presents to the office for evaluation of fecal incontinence. She states that she has approximately 15 major accidents a month. She has leakage on a daily basis. This is been going on for several years and getting worse. 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 She denies any rectal bleeding and is up-to-date on her colonoscopies. She also has some bladder issues.  She underwent anal floor testing which showed relatively intact sphincters and decreased muscle function on manometry.   Other Problems Elbert Ewings, CMA; 07/14/2016 2:36 PM) Back Pain Bladder Problems Cancer Gastroesophageal Reflux Disease General anesthesia - complications Heart murmur Hemorrhoids Migraine Headache Oophorectomy Bilateral. Other disease, cancer, significant illness  Past Surgical History Elbert Ewings, CMA; 07/14/2016 2:36 PM) Appendectomy Breast Augmentation Bilateral. Breast Biopsy Left. Cataract Surgery Bilateral. Colon Polyp Removal - Colonoscopy Foot Surgery Right. Hemorrhoidectomy Hysterectomy (not due to cancer) - Partial Oral Surgery Shoulder Surgery Bilateral. Spinal Surgery - Neck Tonsillectomy  Diagnostic Studies History Elbert Ewings, CMA; 07/14/2016 2:36 PM) Colonoscopy 1-5 years ago Mammogram 1-3 years ago  Allergies Elbert Ewings, CMA; 07/14/2016 2:38 PM) Codeine Sulfate *ANALGESICS - OPIOID* Imitrex *MIGRAINE PRODUCTS* Morphine Sulfate *ANALGESICS - OPIOID* Latex Exam Gloves *MEDICAL DEVICES AND SUPPLIES* Rash.  Medication History Elbert Ewings, CMA; 07/14/2016 2:41 PM) Vitamin D (Ergocalciferol) (50000UNIT Capsule, Oral twice a week) Active. Pantoprazole Sodium (40MG  Tablet DR, Oral) Active. PriLOSEC (20MG  Capsule DR, Oral) Active. Ampyra (10MG  Tablet ER 12HR, Oral)  Active. LamoTRIgine (100MG  Tablet, Oral) Active. ROPINIRole HCl (0.25MG  Tablet, Oral) Active. Prolixin (2.5MG /ML Solution, Injection twice yearly) Active. Loratadine (10MG  Tablet, Oral) Active. Calcium Gluconate (600MG  Tablet, Oral) Active. Mature Adult Century (Oral) Active. Flonase (50MCG/ACT Suspension, Nasal) Active. Colestipol HCl (1GM Tablet, Oral) Active. Align (Oral) Active.  Social History Elbert Ewings, [place]; 07/14/2016 2:36 PM) No alcohol use No caffeine use No drug use Tobacco use Never smoker.  Family History Elbert Ewings, [place]; 07/14/2016 2:36 PM) Alcohol Abuse Brother, Mother. Breast Cancer Sister. Cancer Brother, Mother, Sister. Depression Sister. Heart Disease Mother. Hypertension Brother, Mother. Migraine Headache Daughter, Sister. Respiratory Condition Mother.  Pregnancy / Birth History Elbert Ewings, CMA; 07/14/2016 2:36 PM) Age at menarche 37 years. Age of menopause <46 Contraceptive History Oral contraceptives. Gravida 3 Maternal age 54-25 Para 2    Review of Systems  General Present- Fatigue. Not Present- Appetite Loss, Chills, Fever, Night Sweats, Weight Gain and Weight Loss. Skin Not Present- Change in Wart/Mole, Dryness, Hives, Jaundice, New Lesions, Non-Healing Wounds, Rash and Ulcer. HEENT Present- Hearing Loss, Ringing in the Ears, Seasonal Allergies, Visual Disturbances and Wears glasses/contact lenses. Not Present- Earache, Hoarseness, Nose Bleed, Oral Ulcers, Sinus Pain, Sore Throat and Yellow Eyes. Respiratory Present- Chronic Cough. Not Present- Bloody sputum, Difficulty Breathing, Snoring and Wheezing. Breast Not Present- Breast Mass, Breast Pain, Nipple Discharge and Skin Changes. Cardiovascular Present- Shortness of Breath. Not Present- Chest Pain, Difficulty Breathing Lying Down, Leg Cramps, Palpitations, Rapid Heart Rate and Swelling of Extremities. Gastrointestinal Present- Abdominal Pain, Bloating, Difficulty  Swallowing, Excessive gas, Hemorrhoids, Indigestion and Rectal Pain. Not Present- Bloody Stool, Change in Bowel Habits, Chronic diarrhea, Constipation, Gets full quickly at meals, Nausea and Vomiting. Female Genitourinary Present- Frequency, Painful Urination and Urgency. Not Present- Nocturia and Pelvic Pain. Musculoskeletal Present- Back Pain, Joint Stiffness and Muscle Weakness. Not Present- Joint Pain, Muscle Pain and Swelling of Extremities. Neurological Present- Headaches,  Numbness, Tingling, Trouble walking and Weakness. Not Present- Decreased Memory, Fainting, Seizures and Tremor. Psychiatric Not Present- Anxiety, Bipolar, Change in Sleep Pattern, Depression, Fearful and Frequent crying. Endocrine Present- Cold Intolerance and Heat Intolerance. Not Present- Excessive Hunger, Hair Changes, Hot flashes and New Diabetes. Hematology Not Present- Blood Thinners, Easy Bruising, Excessive bleeding, Gland problems, HIV and Persistent Infections.  BP 126/70   Pulse 72   Temp 98.5 F (36.9 C) (Oral)   Resp 16   Wt 65.5 kg (144 lb 8 oz)   SpO2 99%   BMI 20.73 kg/m      Physical Exam Leighton Ruff MD; [id number] 4:42 PM) General Mental Status-Alert. General Appearance-Not in acute distress. Build & Nutrition-Well nourished. Posture-Normal posture. Gait-Normal.  Head and Neck Head-normocephalic, atraumatic with no lesions or palpable masses. Trachea-midline.  Chest and Lung Exam Chest and lung exam reveals -on auscultation, normal breath sounds, no adventitious sounds and normal vocal resonance.  Cardiovascular Cardiovascular examination reveals -normal heart sounds, regular rate and rhythm with no murmurs.  Abdomen Inspection Inspection of the abdomen reveals - No Hernias. Palpation/Percussion Palpation and Percussion of the abdomen reveal - Soft, Non Tender, No Rigidity (guarding), No hepatosplenomegaly and No Palpable abdominal masses.  Rectal Anorectal  Exam External - Note: Gaping noted, moderate rectal tone. Internal - Note: Very weak squeeze pressure, anterior defect noted, normal push.   ANOSCOPY, DIAGNOSTIC [id number]) [ Hemorrhoids ] Procedure Other: Procedure: Anoscopy Surgeon: Marcello Moores After the risks and benefits were explained, verbal consent was obtained for above procedure. A medical assistant chaperone was present thoroughout the entire procedure. Anesthesia: none Diagnosis: Fecal incontinence Findings: Grade 1 internal hemorrhoids with no signs of inflammation      Assessment & Plan Leighton Ruff MD; [id number] 3:20 PM) FULL INCONTINENCE OF FECES (R15.9) Impression: 65 year old female who presents to the office for evaluation of fecal incontinence. We will have her do a bowel diarrhea for the next couple weeks. We will also get her scheduled for anal manometry and anal ultrasound. I think she is a good candidate for sacral nerve stimulator. I have discussed this process with her as well as all other options for surgical correction. She has agreed to proceed with the test phase.  All questions were answered.

## 2016-09-29 ENCOUNTER — Other Ambulatory Visit: Payer: Self-pay | Admitting: Neurology

## 2016-09-30 ENCOUNTER — Encounter (HOSPITAL_BASED_OUTPATIENT_CLINIC_OR_DEPARTMENT_OTHER): Payer: Self-pay | Admitting: *Deleted

## 2016-09-30 NOTE — Progress Notes (Signed)
NPO AFTER MN.  ARRIVE AT 1030.  CURRENT HG AND EKG IN CHART AND EPIC.  WILL TAKE AM MEDS DOS W/ SIPS OF WATER W/ EXCEPTION NO VITAMINS.

## 2016-10-01 ENCOUNTER — Other Ambulatory Visit: Payer: Self-pay | Admitting: General Surgery

## 2016-10-05 ENCOUNTER — Ambulatory Visit (HOSPITAL_BASED_OUTPATIENT_CLINIC_OR_DEPARTMENT_OTHER): Payer: PPO | Admitting: Anesthesiology

## 2016-10-05 ENCOUNTER — Ambulatory Visit (HOSPITAL_BASED_OUTPATIENT_CLINIC_OR_DEPARTMENT_OTHER)
Admission: RE | Admit: 2016-10-05 | Discharge: 2016-10-05 | Disposition: A | Discharge status: Home / Self Care | Payer: PPO | Source: Ambulatory Visit | Attending: General Surgery | Admitting: General Surgery

## 2016-10-05 ENCOUNTER — Encounter (HOSPITAL_BASED_OUTPATIENT_CLINIC_OR_DEPARTMENT_OTHER): Payer: Self-pay

## 2016-10-05 ENCOUNTER — Encounter (HOSPITAL_BASED_OUTPATIENT_CLINIC_OR_DEPARTMENT_OTHER)
Admission: RE | Disposition: A | Discharge status: Home / Self Care | Payer: Self-pay | Source: Ambulatory Visit | Attending: General Surgery

## 2016-10-05 DIAGNOSIS — Z885 Allergy status to narcotic agent status: Secondary | ICD-10-CM | POA: Diagnosis not present

## 2016-10-05 DIAGNOSIS — J42 Unspecified chronic bronchitis: Secondary | ICD-10-CM | POA: Diagnosis not present

## 2016-10-05 DIAGNOSIS — Z8249 Family history of ischemic heart disease and other diseases of the circulatory system: Secondary | ICD-10-CM | POA: Diagnosis not present

## 2016-10-05 DIAGNOSIS — Z803 Family history of malignant neoplasm of breast: Secondary | ICD-10-CM | POA: Diagnosis not present

## 2016-10-05 DIAGNOSIS — K219 Gastro-esophageal reflux disease without esophagitis: Secondary | ICD-10-CM | POA: Diagnosis not present

## 2016-10-05 DIAGNOSIS — R159 Full incontinence of feces: Secondary | ICD-10-CM | POA: Diagnosis not present

## 2016-10-05 DIAGNOSIS — J449 Chronic obstructive pulmonary disease, unspecified: Secondary | ICD-10-CM | POA: Insufficient documentation

## 2016-10-05 DIAGNOSIS — G35 Multiple sclerosis: Secondary | ICD-10-CM | POA: Insufficient documentation

## 2016-10-05 DIAGNOSIS — Z90722 Acquired absence of ovaries, bilateral: Secondary | ICD-10-CM | POA: Diagnosis not present

## 2016-10-05 DIAGNOSIS — M1990 Unspecified osteoarthritis, unspecified site: Secondary | ICD-10-CM | POA: Insufficient documentation

## 2016-10-05 DIAGNOSIS — R011 Cardiac murmur, unspecified: Secondary | ICD-10-CM | POA: Insufficient documentation

## 2016-10-05 DIAGNOSIS — Z91040 Latex allergy status: Secondary | ICD-10-CM | POA: Diagnosis not present

## 2016-10-05 DIAGNOSIS — Z888 Allergy status to other drugs, medicaments and biological substances status: Secondary | ICD-10-CM | POA: Insufficient documentation

## 2016-10-05 DIAGNOSIS — R1312 Dysphagia, oropharyngeal phase: Secondary | ICD-10-CM | POA: Diagnosis not present

## 2016-10-05 SURGERY — INSERTION, PULSE GENERATOR, NEUROSTIMULATOR, GASTRIC
Anesthesia: Monitor Anesthesia Care | Site: Buttocks | Laterality: Left

## 2016-10-05 MED ORDER — ACETAMINOPHEN 650 MG RE SUPP
650.0000 mg | RECTAL | Status: DC | PRN
  Filled 2016-10-05: qty 1

## 2016-10-05 MED ORDER — DEXAMETHASONE SODIUM PHOSPHATE 4 MG/ML IJ SOLN
INTRAMUSCULAR | Status: DC | PRN
Start: 2016-10-05 — End: 2016-10-05
  Administered 2016-10-05: 10 mg via INTRAVENOUS

## 2016-10-05 MED ORDER — ACETAMINOPHEN 325 MG PO TABS
650.0000 mg | ORAL_TABLET | ORAL | Status: DC | PRN
  Filled 2016-10-05: qty 2

## 2016-10-05 MED ORDER — SODIUM CHLORIDE 0.9% FLUSH
3.0000 mL | Freq: Two times a day (BID) | INTRAVENOUS | Status: DC
  Filled 2016-10-05: qty 3

## 2016-10-05 MED ORDER — FENTANYL CITRATE (PF) 100 MCG/2ML IJ SOLN
INTRAMUSCULAR | Status: DC | PRN
  Administered 2016-10-05: 25 ug via INTRAVENOUS

## 2016-10-05 MED ORDER — BUPIVACAINE-EPINEPHRINE (PF) 0.5% -1:200000 IJ SOLN
INTRAMUSCULAR | Status: AC
  Filled 2016-10-05: qty 30

## 2016-10-05 MED ORDER — SODIUM CHLORIDE 0.9 % IV SOLN
INTRAVENOUS | Status: DC | PRN
  Administered 2016-10-05: 10 ug/kg/min via INTRAVENOUS

## 2016-10-05 MED ORDER — PROPOFOL 500 MG/50ML IV EMUL
INTRAVENOUS | Status: AC
  Filled 2016-10-05: qty 50

## 2016-10-05 MED ORDER — LACTATED RINGERS IV SOLN
INTRAVENOUS | Status: DC
  Administered 2016-10-05: 11:00:00 via INTRAVENOUS
  Filled 2016-10-05: qty 1000

## 2016-10-05 MED ORDER — BUPIVACAINE-EPINEPHRINE 0.5% -1:200000 IJ SOLN
INTRAMUSCULAR | Status: DC | PRN
  Administered 2016-10-05: 20 mL

## 2016-10-05 MED ORDER — PHENYLEPHRINE 40 MCG/ML (10ML) SYRINGE FOR IV PUSH (FOR BLOOD PRESSURE SUPPORT)
PREFILLED_SYRINGE | INTRAVENOUS | Status: AC
  Filled 2016-10-05: qty 10

## 2016-10-05 MED ORDER — LIDOCAINE 2% (20 MG/ML) 5 ML SYRINGE
INTRAMUSCULAR | Status: AC
  Filled 2016-10-05: qty 10

## 2016-10-05 MED ORDER — FENTANYL CITRATE (PF) 100 MCG/2ML IJ SOLN
INTRAMUSCULAR | Status: AC
  Filled 2016-10-05: qty 2

## 2016-10-05 MED ORDER — CEFAZOLIN SODIUM-DEXTROSE 2-4 GM/100ML-% IV SOLN
2.0000 g | INTRAVENOUS | Status: AC
  Administered 2016-10-05: 2 g via INTRAVENOUS
  Filled 2016-10-05: qty 100

## 2016-10-05 MED ORDER — MIDAZOLAM HCL 5 MG/5ML IJ SOLN
INTRAMUSCULAR | Status: DC | PRN
  Administered 2016-10-05: 2 mg via INTRAVENOUS

## 2016-10-05 MED ORDER — PROPOFOL 500 MG/50ML IV EMUL
INTRAVENOUS | Status: DC | PRN
  Administered 2016-10-05: 200 ug/kg/min via INTRAVENOUS

## 2016-10-05 MED ORDER — SODIUM CHLORIDE 0.9 % IV SOLN
250.0000 mL | INTRAVENOUS | Status: DC | PRN
  Filled 2016-10-05: qty 250

## 2016-10-05 MED ORDER — ONDANSETRON HCL 4 MG/2ML IJ SOLN
INTRAMUSCULAR | Status: DC | PRN
  Administered 2016-10-05: 4 mg via INTRAVENOUS

## 2016-10-05 MED ORDER — KETAMINE HCL 10 MG/ML IJ SOLN
INTRAMUSCULAR | Status: AC
  Filled 2016-10-05: qty 1

## 2016-10-05 MED ORDER — MIDAZOLAM HCL 2 MG/2ML IJ SOLN
INTRAMUSCULAR | Status: AC
  Filled 2016-10-05: qty 2

## 2016-10-05 MED ORDER — SODIUM CHLORIDE 0.9% FLUSH
3.0000 mL | INTRAVENOUS | Status: DC | PRN
  Filled 2016-10-05: qty 3

## 2016-10-05 MED ORDER — CEFAZOLIN SODIUM-DEXTROSE 2-4 GM/100ML-% IV SOLN
INTRAVENOUS | Status: AC
  Filled 2016-10-05: qty 100

## 2016-10-05 MED ORDER — ONDANSETRON HCL 4 MG/2ML IJ SOLN
INTRAMUSCULAR | Status: AC
  Filled 2016-10-05: qty 2

## 2016-10-05 SURGICAL SUPPLY — 34 items
BLADE HEX COATED 2.75 (ELECTRODE) ×3 IMPLANT
BLADE SURG 15 STRL LF DISP TIS (BLADE) ×1 IMPLANT
BLADE SURG 15 STRL SS (BLADE) ×2
CHLORAPREP W/TINT 26ML (MISCELLANEOUS) ×3 IMPLANT
COVER BACK TABLE 60X90IN (DRAPES) ×3 IMPLANT
COVER MAYO STAND STRL (DRAPES) ×3 IMPLANT
COVER PROBE W GEL 5X96 (DRAPES) ×3 IMPLANT
DRAPE INCISE IOBAN 66X45 STRL (DRAPES) ×3 IMPLANT
DRAPE LAPAROSCOPIC ABDOMINAL (DRAPES) ×3 IMPLANT
DRAPE UTILITY XL STRL (DRAPES) ×3 IMPLANT
ELECT REM PT RETURN 9FT ADLT (ELECTROSURGICAL) ×3
ELECTRODE REM PT RTRN 9FT ADLT (ELECTROSURGICAL) ×1 IMPLANT
GLOVE BIO SURGEON STRL SZ 6.5 (GLOVE) ×2 IMPLANT
GLOVE BIO SURGEONS STRL SZ 6.5 (GLOVE) ×1
GLOVE INDICATOR 7.0 STRL GRN (GLOVE) ×3 IMPLANT
GOWN STRL REUS W/TWL 2XL LVL3 (GOWN DISPOSABLE) ×3 IMPLANT
KIT ROOM TURNOVER WOR (KITS) ×3 IMPLANT
LIQUID BAND (GAUZE/BANDAGES/DRESSINGS) ×3 IMPLANT
NEEDLE HYPO 22GX1.5 SAFETY (NEEDLE) ×3 IMPLANT
PACK BASIN DAY SURGERY FS (CUSTOM PROCEDURE TRAY) ×3 IMPLANT
PAD ARMBOARD 7.5X6 YLW CONV (MISCELLANEOUS) ×3 IMPLANT
PENCIL BUTTON HOLSTER BLD 10FT (ELECTRODE) ×3 IMPLANT
PROGRAMMER ANTENNA EXT (UROLOGICAL SUPPLIES) ×3 IMPLANT
PROGRAMMER STIMUL 2.2X1.1X3.7 (UROLOGICAL SUPPLIES) ×3 IMPLANT
STIMULATOR INTERSTIM 2X1.7X.3 (Orthopedic Implant) ×3 IMPLANT
SUT VIC AB 3-0 SH 27 (SUTURE) ×4
SUT VIC AB 3-0 SH 27X BRD (SUTURE) ×2 IMPLANT
SUT VICRYL 4-0 PS2 18IN ABS (SUTURE) ×3 IMPLANT
SYR BULB IRRIGATION 50ML (SYRINGE) ×3 IMPLANT
SYR CONTROL 10ML LL (SYRINGE) ×3 IMPLANT
TOWEL OR 17X24 6PK STRL BLUE (TOWEL DISPOSABLE) ×6 IMPLANT
TUBE CONNECTING 12'X1/4 (SUCTIONS) ×1
TUBE CONNECTING 12X1/4 (SUCTIONS) ×2 IMPLANT
WATER STERILE IRR 500ML POUR (IV SOLUTION) ×3 IMPLANT

## 2016-10-05 NOTE — Anesthesia Preprocedure Evaluation (Signed)
Anesthesia Evaluation  Patient identified by MRN, date of birth, ID band Patient awake    Reviewed: Allergy & Precautions, NPO status , Patient's Chart, lab work & pertinent test results  History of Anesthesia Complications (+) PONV and history of anesthetic complications  Airway Mallampati: I  TM Distance: >3 FB Neck ROM: Full    Dental  (+) Teeth Intact, Dental Advisory Given   Pulmonary COPD,    breath sounds clear to auscultation       Cardiovascular negative cardio ROS   Rhythm:Regular Rate:Normal     Neuro/Psych  Headaches, negative psych ROS   GI/Hepatic Neg liver ROS, GERD  Medicated and Controlled,  Endo/Other  negative endocrine ROS  Renal/GU negative Renal ROS  negative genitourinary   Musculoskeletal  (+) Arthritis , Osteoarthritis,    Abdominal   Peds negative pediatric ROS (+)  Hematology negative hematology ROS (+)   Anesthesia Other Findings   Reproductive/Obstetrics negative OB ROS                             09/2016 EKG: normal sinus rhythm.  Anesthesia Physical  Anesthesia Plan  ASA: III  Anesthesia Plan: MAC   Post-op Pain Management:    Induction: Intravenous  Airway Management Planned: Natural Airway  Additional Equipment:   Intra-op Plan:   Post-operative Plan:   Informed Consent: I have reviewed the patients History and Physical, chart, labs and discussed the procedure including the risks, benefits and alternatives for the proposed anesthesia with the patient or authorized representative who has indicated his/her understanding and acceptance.     Plan Discussed with: CRNA  Anesthesia Plan Comments: (Famotidine given in PREOP)        Anesthesia Quick Evaluation

## 2016-10-05 NOTE — H&P (View-Only) (Signed)
History of Present Illness  The patient is a 65 year old female who presents with fecal incontinence. 65 year old female with MS who presents to the office for evaluation of fecal incontinence. She states that she has approximately 15 major accidents a month. She has leakage on a daily basis. This is been going on for several years and getting worse. 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 She denies any rectal bleeding and is up-to-date on her colonoscopies. She also has some bladder issues.  She underwent anal floor testing which showed relatively intact sphincters and decreased muscle function on manometry.   Other Problems Elbert Ewings, CMA; 07/14/2016 2:36 PM) Back Pain Bladder Problems Cancer Gastroesophageal Reflux Disease General anesthesia - complications Heart murmur Hemorrhoids Migraine Headache Oophorectomy Bilateral. Other disease, cancer, significant illness  Past Surgical History Elbert Ewings, CMA; 07/14/2016 2:36 PM) Appendectomy Breast Augmentation Bilateral. Breast Biopsy Left. Cataract Surgery Bilateral. Colon Polyp Removal - Colonoscopy Foot Surgery Right. Hemorrhoidectomy Hysterectomy (not due to cancer) - Partial Oral Surgery Shoulder Surgery Bilateral. Spinal Surgery - Neck Tonsillectomy  Diagnostic Studies History Elbert Ewings, CMA; 07/14/2016 2:36 PM) Colonoscopy 1-5 years ago Mammogram 1-3 years ago  Allergies Elbert Ewings, CMA; 07/14/2016 2:38 PM) Codeine Sulfate *ANALGESICS - OPIOID* Imitrex *MIGRAINE PRODUCTS* Morphine Sulfate *ANALGESICS - OPIOID* Latex Exam Gloves *MEDICAL DEVICES AND SUPPLIES* Rash.  Medication History Elbert Ewings, CMA; 07/14/2016 2:41 PM) Vitamin D (Ergocalciferol) (50000UNIT Capsule, Oral twice a week) Active. Pantoprazole Sodium (40MG  Tablet DR, Oral) Active. PriLOSEC (20MG  Capsule DR, Oral) Active. Ampyra (10MG  Tablet ER 12HR, Oral)  Active. LamoTRIgine (100MG  Tablet, Oral) Active. ROPINIRole HCl (0.25MG  Tablet, Oral) Active. Prolixin (2.5MG /ML Solution, Injection twice yearly) Active. Loratadine (10MG  Tablet, Oral) Active. Calcium Gluconate (600MG  Tablet, Oral) Active. Mature Adult Century (Oral) Active. Flonase (50MCG/ACT Suspension, Nasal) Active. Colestipol HCl (1GM Tablet, Oral) Active. Align (Oral) Active.  Social History Elbert Ewings, [place]; 07/14/2016 2:36 PM) No alcohol use No caffeine use No drug use Tobacco use Never smoker.  Family History Elbert Ewings, [place]; 07/14/2016 2:36 PM) Alcohol Abuse Brother, Mother. Breast Cancer Sister. Cancer Brother, Mother, Sister. Depression Sister. Heart Disease Mother. Hypertension Brother, Mother. Migraine Headache Daughter, Sister. Respiratory Condition Mother.  Pregnancy / Birth History Elbert Ewings, CMA; 07/14/2016 2:36 PM) Age at menarche 74 years. Age of menopause <46 Contraceptive History Oral contraceptives. Gravida 3 Maternal age 2-25 Para 2    Review of Systems  General Present- Fatigue. Not Present- Appetite Loss, Chills, Fever, Night Sweats, Weight Gain and Weight Loss. Skin Not Present- Change in Wart/Mole, Dryness, Hives, Jaundice, New Lesions, Non-Healing Wounds, Rash and Ulcer. HEENT Present- Hearing Loss, Ringing in the Ears, Seasonal Allergies, Visual Disturbances and Wears glasses/contact lenses. Not Present- Earache, Hoarseness, Nose Bleed, Oral Ulcers, Sinus Pain, Sore Throat and Yellow Eyes. Respiratory Present- Chronic Cough. Not Present- Bloody sputum, Difficulty Breathing, Snoring and Wheezing. Breast Not Present- Breast Mass, Breast Pain, Nipple Discharge and Skin Changes. Cardiovascular Present- Shortness of Breath. Not Present- Chest Pain, Difficulty Breathing Lying Down, Leg Cramps, Palpitations, Rapid Heart Rate and Swelling of Extremities. Gastrointestinal Present- Abdominal Pain, Bloating, Difficulty  Swallowing, Excessive gas, Hemorrhoids, Indigestion and Rectal Pain. Not Present- Bloody Stool, Change in Bowel Habits, Chronic diarrhea, Constipation, Gets full quickly at meals, Nausea and Vomiting. Female Genitourinary Present- Frequency, Painful Urination and Urgency. Not Present- Nocturia and Pelvic Pain. Musculoskeletal Present- Back Pain, Joint Stiffness and Muscle Weakness. Not Present- Joint Pain, Muscle Pain and Swelling of Extremities. Neurological Present- Headaches,  Numbness, Tingling, Trouble walking and Weakness. Not Present- Decreased Memory, Fainting, Seizures and Tremor. Psychiatric Not Present- Anxiety, Bipolar, Change in Sleep Pattern, Depression, Fearful and Frequent crying. Endocrine Present- Cold Intolerance and Heat Intolerance. Not Present- Excessive Hunger, Hair Changes, Hot flashes and New Diabetes. Hematology Not Present- Blood Thinners, Easy Bruising, Excessive bleeding, Gland problems, HIV and Persistent Infections.  BP 126/70   Pulse 72   Temp 98.5 F (36.9 C) (Oral)   Resp 16   Wt 65.5 kg (144 lb 8 oz)   SpO2 99%   BMI 20.73 kg/m      Physical Exam Leighton Ruff MD; [id number] 4:42 PM) General Mental Status-Alert. General Appearance-Not in acute distress. Build & Nutrition-Well nourished. Posture-Normal posture. Gait-Normal.  Head and Neck Head-normocephalic, atraumatic with no lesions or palpable masses. Trachea-midline.  Chest and Lung Exam Chest and lung exam reveals -on auscultation, normal breath sounds, no adventitious sounds and normal vocal resonance.  Cardiovascular Cardiovascular examination reveals -normal heart sounds, regular rate and rhythm with no murmurs.  Abdomen Inspection Inspection of the abdomen reveals - No Hernias. Palpation/Percussion Palpation and Percussion of the abdomen reveal - Soft, Non Tender, No Rigidity (guarding), No hepatosplenomegaly and No Palpable abdominal masses.  Rectal Anorectal  Exam External - Note: Gaping noted, moderate rectal tone. Internal - Note: Very weak squeeze pressure, anterior defect noted, normal push.   ANOSCOPY, DIAGNOSTIC [id number]) [ Hemorrhoids ] Procedure Other: Procedure: Anoscopy Surgeon: Marcello Moores After the risks and benefits were explained, verbal consent was obtained for above procedure. A medical assistant chaperone was present thoroughout the entire procedure. Anesthesia: none Diagnosis: Fecal incontinence Findings: Grade 1 internal hemorrhoids with no signs of inflammation      Assessment & Plan Leighton Ruff MD; [id number] 3:20 PM) FULL INCONTINENCE OF FECES (R15.9) Impression: 65 year old female who presents to the office for evaluation of fecal incontinence. We will have her do a bowel diarrhea for the next couple weeks. We will also get her scheduled for anal manometry and anal ultrasound. I think she is a good candidate for sacral nerve stimulator. I have discussed this process with her as well as all other options for surgical correction. She has agreed to proceed with the test phase.  All questions were answered.

## 2016-10-05 NOTE — Anesthesia Postprocedure Evaluation (Signed)
Anesthesia Post Note  Patient: [name]  Procedure(s) Performed: Procedure(s) (LRB): INSERTION OF GASTRIC NEUROSTIMULATOR PULSE GENERATOR (Left)  Patient location during evaluation: PACU Anesthesia Type: MAC Level of consciousness: awake and alert Pain management: pain level controlled Vital Signs Assessment: post-procedure vital signs reviewed and stable Respiratory status: spontaneous breathing, nonlabored ventilation, respiratory function stable and patient connected to nasal cannula oxygen Cardiovascular status: stable and blood pressure returned to baseline Anesthetic complications: no    Last Vitals:  Vitals:   10/05/16 1345 10/05/16 1400  BP: 123/75 117/76  Pulse: 71 77  Resp: (!) 9 15  Temp:      Last Pain:  Vitals:   10/05/16 1400  TempSrc:   PainSc: 0-No pain                 Effie Berkshire

## 2016-10-05 NOTE — Transfer of Care (Signed)
Immediate Anesthesia Transfer of Care Note  Patient: [name]  Procedure(s) Performed: Procedure(s): INSERTION OF GASTRIC NEUROSTIMULATOR PULSE GENERATOR (Left)  Patient Location: PACU  Anesthesia Type:MAC  Level of Consciousness: awake, alert , oriented and patient cooperative  Airway & Oxygen Therapy: Patient Spontanous Breathing and Patient connected to nasal cannula oxygen  Post-op Assessment: Report given to RN and Post -op Vital signs reviewed and stable  Post vital signs: Reviewed and stable  Last Vitals:  Vitals:   10/05/16 1025  BP: 124/73  Pulse: 86  Resp: 16  Temp: 36.6 C    Last Pain:  Vitals:   10/05/16 1044  TempSrc:   PainSc: 2       Patients Stated Pain Goal: 0 ([id number])  Complications: No apparent anesthesia complications

## 2016-10-05 NOTE — Interval H&P Note (Signed)
History and Physical Interval Note:  10/05/2016 12:02 PM  [name]  has presented today for surgery, with the diagnosis of FECAL INCONTINENCE  The various methods of treatment have been discussed with the patient and family. After consideration of risks, benefits and other options for treatment, the patient has consented to  Procedure(s): INSERTION OF GASTRIC NEUROSTIMULATOR PULSE GENERATOR (N/A) as a surgical intervention .  The patient's history has been reviewed, patient examined, no change in status, stable for surgery.  I have reviewed the patient's chart and labs.  Questions were answered to the patient's satisfaction.     Pt has had a 100% response for the test wire over the last week with no episodes of incontinent.  It has also improved her urinary symptoms.  She would like to proceed with implatation.  Risks of infection and device malfunction discussed with the pt.    Rosario Adie, MD  [facility]

## 2016-10-05 NOTE — Discharge Instructions (Addendum)
°Post Anesthesia Home Care Instructions ° °Activity: °Get plenty of rest for the remainder of the day. A responsible adult should stay with you for 24 hours following the procedure.  °For the next 24 hours, DO NOT: °-Drive a car °-Operate machinery °-Drink alcoholic beverages °-Take any medication unless instructed by your physician °-Make any legal decisions or sign important papers. ° °Meals: °Start with liquid foods such as gelatin or soup. Progress to regular foods as tolerated. Avoid greasy, spicy, heavy foods. If nausea and/or vomiting occur, drink only clear liquids until the nausea and/or vomiting subsides. Call your physician if vomiting continues. ° °Special Instructions/Symptoms: °Your throat may feel dry or sore from the anesthesia or the breathing tube placed in your throat during surgery. If this causes discomfort, gargle with warm salt water. The discomfort should disappear within 24 hours. ° °If you had a scopolamine patch placed behind your ear for the management of post- operative nausea and/or vomiting: ° °1. The medication in the patch is effective for 72 hours, after which it should be removed.  Wrap patch in a tissue and discard in the trash. Wash hands thoroughly with soap and water. °2. You may remove the patch earlier than 72 hours if you experience unpleasant side effects which may include dry mouth, dizziness or visual disturbances. °3. Avoid touching the patch. Wash your hands with soap and water after contact with the patch. °  °GENERAL SURGERY: POST OP INSTRUCTIONS ° °1. DIET: Follow a light bland diet the first 24 hours after arrival home, such as soup, liquids, crackers, etc.  Be sure to include lots of fluids daily.  Avoid fast food or heavy meals as your are more likely to get nauseated.   °2. Take your usually prescribed home medications unless otherwise directed. °3. PAIN CONTROL: °a. Pain is best controlled by a usual combination of three different methods  TOGETHER: °i. Ice/Heat °ii. Over the counter pain medication °iii. Prescription pain medication °b. Most patients will experience some swelling and bruising around the incisions.  Ice packs or heating pads (30-60 minutes up to 6 times a day) will help. Use ice for the first few days to help decrease swelling and bruising, then switch to heat to help relax tight/sore spots and speed recovery.  Some people prefer to use ice alone, heat alone, alternating between ice & heat.  Experiment to what works for you.  Swelling and bruising can take several weeks to resolve.   °c. It is helpful to take an over-the-counter pain medication regularly for the first few weeks.  Choose one of the following that works best for you: °i. Naproxen (Aleve, etc)  Two 220mg tabs twice a day °ii. Ibuprofen (Advil, etc) Three 200mg tabs four times a day (every meal & bedtime) °d. A  prescription for pain medication (such as Percocet, oxycodone, hydrocodone, etc) should be given to you upon discharge.  Take your pain medication as prescribed.  °i. If you are having problems/concerns with the prescription medicine (does not control pain, nausea, vomiting, rash, itching, etc), please call us [phone] to see if we need to switch you to a different pain medicine that will work better for you and/or control your side effect better. °ii. If you need a refill on your pain medication, please contact your pharmacy.  They will contact our office to request authorization. Prescriptions will not be filled after 5 pm or on week-ends. °4. Avoid getting constipated.  Between the surgery and the pain medications, it   is common to experience some constipation.  Increasing fluid intake and taking a fiber supplement (such as Metamucil, Citrucel, FiberCon, MiraLax, etc) 1-2 times a day regularly will usually help prevent this problem from occurring.  A mild laxative (prune juice, Milk of Magnesia, MiraLax, etc) should be taken according to package directions  if there are no bowel movements after 48 hours.   °5. Wash / shower every day.  You may shower over the dressings as they are waterproof.  Continue to shower over incision(s) after the dressing is off. °6. Remove your waterproof bandages 5 days after surgery.  You may leave the incision open to air.  You may have skin tapes (Steri Strips) covering the incision(s).  Leave them on until one week, then remove.  You may replace a dressing/Band-Aid to cover the incision for comfort if you wish.  ° ° ° ° °7. ACTIVITIES as tolerated:   °a. You may resume regular (light) daily activities beginning the next day--such as daily self-care, walking, climbing stairs--gradually increasing activities as tolerated.  If you can walk 30 minutes without difficulty, it is safe to try more intense activity such as jogging, treadmill, bicycling, low-impact aerobics, swimming, etc. °b. Save the most intensive and strenuous activity for last such as sit-ups, heavy lifting, contact sports, etc  Refrain from any heavy lifting or straining until you are off narcotics for pain control.   °c. DO NOT PUSH THROUGH PAIN.  Let pain be your guide: If it hurts to do something, don't do it.  Pain is your body warning you to avoid that activity for another week until the pain goes down. °d. You may drive when you are no longer taking prescription pain medication, you can comfortably wear a seatbelt, and you can safely maneuver your car and apply brakes. °e. You may have sexual intercourse when it is comfortable.  °8. FOLLOW UP in our office °a. Please call [facility] at [phone] to set up an appointment to see your surgeon in the office for a follow-up appointment approximately 2-3 weeks after your surgery. °b. Make sure that you call for this appointment the day you arrive home to insure a convenient appointment time. °9. IF YOU HAVE DISABILITY OR FAMILY LEAVE FORMS, BRING THEM TO THE OFFICE FOR PROCESSING.  DO NOT GIVE THEM TO YOUR DOCTOR. ° ° °WHEN TO  CALL US [phone]: °1. Poor pain control °2. Reactions / problems with new medications (rash/itching, nausea, etc)  °3. Fever over 101.5 F (38.5 C) °4. Worsening swelling or bruising °5. Continued bleeding from incision. °6. Increased pain, redness, or drainage from the incision ° ° The clinic staff is available to answer your questions during regular business hours (8:30am-5pm).  Please don’t hesitate to call and ask to speak to one of our nurses for clinical concerns.  ° If you have a medical emergency, go to the nearest emergency room or call 911. ° A surgeon from [facility] is always on call at the hospitals ° ° °[facility] °[address], [address], [place], [place]  [address] ? °MAIN: [phone] ? TOLL FREE: [phone] ?  °FAX [phone] °[email or web address] ° ° °

## 2016-10-06 ENCOUNTER — Telehealth: Payer: Self-pay | Admitting: Neurology

## 2016-10-06 MED ORDER — LAMOTRIGINE 150 MG PO TABS: ORAL_TABLET | ORAL | 11 refills | Status: DC

## 2016-10-06 NOTE — Telephone Encounter (Signed)
Pt called said she has 2 bottles of amoTRIgine (LAMICTAL) 100 MG tablet but does not have any lamoTRIgine (LAMICTAL) 150 MG tablet. She is confused and wants to know what dose is correct

## 2016-10-06 NOTE — Op Note (Addendum)
10/05/2016  8:22 AM  PATIENT:  [name]  65 y.o. female  Patient Care Team: Crist Infante, MD as PCP - General (Internal Medicine) Sheilah Mins, MD as Referring Physician (Surgical Oncology)  PRE-OPERATIVE DIAGNOSIS:  FECAL INCONTINENCE  POST-OPERATIVE DIAGNOSIS:  FECAL INCONTINENCE  PROCEDURE:  INSERTION OF SACRAL NERVE NEUROSTIMULATOR    Surgeon(s): Leighton Ruff, MD  ASSISTANT: none   ANESTHESIA:   local and MAC  EBL:  Total I/O In: 1330 [P.O.:480; I.V.:850] Out: 205 [Urine:200; Blood:5]  DRAINS: none   SPECIMEN:  No Specimen  DISPOSITION OF SPECIMEN:  N/A  COUNTS:  YES  PLAN OF CARE: Discharge to home after PACU  PATIENT DISPOSITION:  PACU - hemodynamically stable.  INDICATION: Fecal incontinence.  The patient has underwent the test phase for 7 days and has had 0 episodes since then.  This is a 100% reduction in symptoms.  The patient agrees to proceed with full implantation of the neuro stimulator device.   OR FINDINGS: Wire lead intact.  Impedence values < 1000.  DESCRIPTION: The patient was identified in the preoperative holding area and taken to the OR where they were laid prone on the operating room table.  MAC3 anesthesia was induced without difficulty. SCDs were also noted to be in place prior to the initiation of anesthesia.  The patient was then prepped and draped in the usual sterile fashion.   A surgical timeout was performed indicating the correct patient, procedure, positioning and need for preoperative antibiotics.  Local injection of 0.5% Marcaine was administered.  The previous upper buttock incision was opened using blunt dissection and the lead/percutaneous connection was identified and evaluated. The percutaneous extension was cut on the braided wire and was removed from the field ensuring sterility. The subcutaneous pocket was enlarged to accommodate the internal neurostimulator (INS) and hemostasis was established. The sutures  securing the protective boot were cut and the boot retracted to expose the four set screws. Using the torque wrench, the screws were loosened and the percutaneous extension was disconnected from the lead. The boot and connection were removed and Discarded.  The lead was cleaned and dried. The lead was inserted into the header of the InterStim neurostimulator until the blue tip was visualized at the distal window. The single set screw was tightened using the torque wrench until audible click was heard. The neurostimulator was placed into the pocket with the etched identification side placed upwards and any excessive lead was placed around the neurostimulator. The clinician programmer telemetry head, covered in a sterile sleeve, was placed over the implanted neurostimulator to ensure proper lead connection and that parameters were within normal range. Impedances were confirmed to be within normal limits, greater than 50 and less than 4,000 ohms. The wound was irrigated with sterile water and closed with a running 3-0 Vicryl subcuticular suture and a 4-0 Vicryl running skin suture. Counts were correct. Dermabond was placed over the incision. The patient was transferred to the PACU in satisfactory condition. Using the clinician programmer, the INS was programmed.   Patient was provided utilization instructions for the patient programmer prior to discharge.

## 2016-10-06 NOTE — Telephone Encounter (Signed)
I have spoken with [name]--she is out of Lamictal 150mg  po bid.  Had been taking 100mg  bid, but due to continued burning pain in legs/feet, RAS increased it to 150mg  po bid.  New rx. escribed to [facility] per her request/fim

## 2016-11-08 ENCOUNTER — Telehealth: Payer: Self-pay | Admitting: Neurology

## 2016-11-08 ENCOUNTER — Encounter: Payer: Self-pay | Admitting: Neurology

## 2016-11-08 ENCOUNTER — Ambulatory Visit (INDEPENDENT_AMBULATORY_CARE_PROVIDER_SITE_OTHER): Payer: PPO | Admitting: Neurology

## 2016-11-08 VITALS — BP 142/90 | HR 80 | Resp 14 | Ht 70.0 in | Wt 147.0 lb

## 2016-11-08 DIAGNOSIS — M5481 Occipital neuralgia: Secondary | ICD-10-CM

## 2016-11-08 DIAGNOSIS — G373 Acute transverse myelitis in demyelinating disease of central nervous system: Secondary | ICD-10-CM | POA: Diagnosis not present

## 2016-11-08 DIAGNOSIS — G35 Multiple sclerosis: Secondary | ICD-10-CM

## 2016-11-08 DIAGNOSIS — R350 Frequency of micturition: Secondary | ICD-10-CM

## 2016-11-08 DIAGNOSIS — M542 Cervicalgia: Secondary | ICD-10-CM | POA: Diagnosis not present

## 2016-11-08 DIAGNOSIS — R261 Paralytic gait: Secondary | ICD-10-CM | POA: Diagnosis not present

## 2016-11-08 DIAGNOSIS — R413 Other amnesia: Secondary | ICD-10-CM | POA: Diagnosis not present

## 2016-11-08 DIAGNOSIS — R5383 Other fatigue: Secondary | ICD-10-CM

## 2016-11-08 MED ORDER — IMIPRAMINE HCL 25 MG PO TABS: 25.0000 mg | ORAL_TABLET | Freq: Every day | ORAL | 5 refills | Status: DC

## 2016-11-08 MED ORDER — AMPHETAMINE-DEXTROAMPHET ER 15 MG PO CP24: 15.0000 mg | ORAL_CAPSULE | ORAL | 0 refills | Status: DC

## 2016-11-08 NOTE — Telephone Encounter (Signed)
Pt called in and needs PA on amphetamine-dextroamphetamine (ADDERALL XR) 15 MG 24 hr capsule. Please call and advise

## 2016-11-08 NOTE — Telephone Encounter (Signed)
PA for Adderall completed over the phone with [facility] (phone# [phone]).  Dx. 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 PA apprpoved thru 12-26-16.  Ref# [id number]

## 2016-11-08 NOTE — Progress Notes (Addendum)
[facility]  PATIENT: [name] DOB: [birth year]  REFERRING CLINICIAN: Crist Infante HISTORY FROM: patient REASON FOR VISIT: transverse myelitis, MS?   HISTORICAL  CHIEF COMPLAINT:  Chief Complaint  Patient presents with  . Multiple Sclerosis    Sts. she continues to wear bilat. 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  Feels her memory is much worse and would like to discuss tx. for this  MOCA done today/fim    HISTORY OF PRESENT ILLNESS:  [name] is a 65 yo woman with Multiple Sclerosis.   Currently, she is reporting a new symptom:  difficulty with her memory.   Memory:   Over the past year, she and her husband has noted that she has had more difficulty with memory and recall. Most of the time, if she gets a headache she is able to remember what she was having difficulty with. Onset was gradual.   She has lost some items around the house. She sometimes goes into a room and is not sure why she is there. She has had a little bit more difficulty with math related tasks. She has noted decreased focus and attention.    She denies any depression. She has no new medical problems  Sleep:    On a typical night, she goes to bed around 10:30 to 11 PM and wakes up at 7. She often has trouble falling asleep and always has trouble staying asleep. She will wake up 3 or 4 times to urinate. She moves around a lot in bed and has difficulty getting comfortable due to the dysesthetic sensations in her legs.   Lamotrigine was her legs more during the day than it does at night.   She will often take a 1-1/2 to two-hour nap.  Right sided headaches and neck pain:   She reports tha the pain in the right neck and occiput has retruned.   She was better after the occipital nerve block and trigger point injections (neck muscles) in the past.   Pain radiates forward to the right vertex.         Transverse  myelitis/gait/strength/sensation/bladder:    She has a history of a T4 transverse myelitis.  She walks better with the new AFO braces but stil stumble sand has some falls.      She is on  Ampyra 5 mg po tid (compounded) and tolerates it well.   Lamotrigine is helping her burning dysesthesias in both feet and hands, but less so at night.    Lyrica and gabapentin only helped a bit and the benfit was less after a while.   Burning is worse at night.  She was once on amitriptyline many years ago and had a benefit for a while only.    Vision:   Earlier in 2017, she had a possible optic neuritis exacerbation.  She sees colors better out of the right eye than the left eye.    She first noticed this 2 weeks ago.  OS is more blurry.   She will be seeing ophthalmology.     She had macular edema on Gilenya that improved after stopping it.      Bladder:  She has urinary frequency and incontinence since the early 1990s but is much better since a stim surgery.      Fatigue/mood:  She feels fatigue is sometimes a problem and she 'wears out quickly' physically and mentally.  She denies depression or anxiety.  She sometimes has difficulty coming up with the right words. She has had difficulty multitasking and completing tasks.       Montreal Cognitive Assessment  11/08/2016  Visuospatial/ Executive (0/5) 3  Naming (0/3) 3  Attention: Read list of digits (0/2) 1  Attention: Read list of letters (0/1) 1  Attention: Serial 7 subtraction starting at 100 (0/3) 3  Language: Repeat phrase (0/2) 1  Language : Fluency (0/1) 1  Abstraction (0/2) 2  Delayed Recall (0/5) 2  Orientation (0/6) 5  Total 22  Adjusted Score (based on education) 22       TM/MS History:  In 1989, she had severe numbness in one side of her body.  She had clumsiness and poor gait.  MRI was reportedly normal.     She saw Dr. Erling Cruz who did an LP that showed oligoclonal bands.   She received IV steroids and was told she had MS.   In 1993, she  woke up numb from the waist down in both legs.  This numbness was more intense than the prior episodes and gait was very poor.    She was admitted x 28 days, receiving many days of IV steroids followed by Rehab.     An MRI of the brain was normal but the MRI of the thoracic spine showed a plaque at T4.     She was started on Betaseron and did well in general.  She had some fluctuating symptom but nothing resembling any of the other 3 episodes.   In November 2014 she had additional imaging studies showing a normal MRI of the brain, an abnormal MRI of the thoracic spine with a focus at T4, and a normal cervical spinal cord. She did have evidence of prior fusion surgery in the neck.  MRI of the cervical spine in September 2016 showed stable postoperative ACDF at C4-C5. The spinal cord appeared normal.   There was no myelopathy in the cervical spine. She also underwent a lumbar puncture in November 2014. It was normal and did not show oligoclonal bands or increased IgG index.    She has been treated with Betaseron, Avonex, Tysabri and Gilenya for presumptive MS in the past.   She stopped Tysabri as was JCV Ab positive and stopped Gilenya as she had macula edema.   She tried [place] but stopped due to insurance.  No DMT since 2014.       REVIEW OF SYSTEMS:  Constitutional: No fevers, chills, sweats, or change in .  She reports fatigue Eyes: as above Ear, nose and throat: No hearing loss, ear pain, nasal congestion, sore throat Cardiovascular: No chest pain, palpitations Respiratory:  No shortness of breath at rest or with exertion.   No wheezes GastrointestinaI: No nausea, vomiting, diarrhea, abdominal pain.  Reports fecal incontinence.   Reports dysphagia Genitourinary:  see above Musculoskeletal:  No neck pain, back pain.   Hasmuscle cramps Integumentary: No rash, pruritus, skin lesions Neurological: as above Psychiatric: No depression at this time.  No anxiety Endocrine: No palpitations, diaphoresis,  change in appetite, change in weigh or increased thirst Hematologic/Lymphatic:  No anemia, purpura, petechiae. Allergic/Immunologic: No itchy/runny eyes, nasal congestion, recent allergic reactions, rashes  ALLERGIES: Allergies  Allergen Reactions  . Other Shortness Of Breath    PURPLE LETTUCE  . Betaseron [Interferon Beta-1b] Other (See Comments)    HARD NODULAR AREAS AT INJECTION SITE  . Codeine Nausea And Vomiting  . Gilenya [Fingolimod Hydrochloride] Other (  See Comments)    MACULAR EDEMA  . Morphine And Related Other (See Comments)    IV ROUTE - RED STREAKS OF VEIN  . Sumatriptan Nausea Only    INJECTABLE ONLY - RAPID HEART RATE, FLUSHING  . Tysabri [Natalizumab] Other (See Comments)    DEVELOPED JCV ANTIBODY  . Latex Rash    HOME MEDICATIONS: Outpatient Medications Prior to Visit  Medication Sig Dispense Refill  . acetaminophen (TYLENOL) 500 MG tablet Take 500 mg by mouth every 6 (six) hours as needed.    . Biotin 10000 MCG TABS Take 1 tablet by mouth daily.    . calcium carbonate (OS-CAL) 600 MG TABS tablet Take 1,200 mg by mouth.     . dalfampridine 10 MG TB12 Take 1 tablet (10 mg total) by mouth 3 (three) times daily. Dose change (Patient taking differently: Take 5 mg by mouth 3 (three) times daily. Dose change - 5 mg tid) 90 tablet 11  . denosumab (PROLIA) 60 MG/ML SOLN injection Inject 60 mg into the skin every 6 (six) months. Administer in upper arm, thigh, or abdomen    . diphenhydramine-acetaminophen (TYLENOL PM) 25-500 MG TABS Take 2 tablets by mouth at bedtime as needed (sleep). Reported on 01/13/2016    . lamoTRIgine (LAMICTAL) 150 MG tablet One po bid 60 tablet 11  . loratadine (CLARITIN) 10 MG tablet Take 10 mg by mouth every morning.     . Meclizine HCl 25 MG CHEW Chew 2 tablets by mouth as needed (dizziness).     . Multiple Vitamins-Minerals (MULTIVITAMIN PO) Take by mouth. Brand name - Mature Multivitamin from [employer]    . pantoprazole (PROTONIX) 40 MG  tablet Take 1 tablet (40 mg total) by mouth every morning. (Patient taking differently: Take 40 mg by mouth 2 (two) times daily. ) 30 tablet 3  . rOPINIRole (REQUIP) 0.25 MG tablet TAKE 1 TO 2 TABLETS BY MOUTH AT NIGHT 60 tablet 9  . Vitamin D, Ergocalciferol, (DRISDOL) 50000 UNITS CAPS capsule Take 1 capsule (50,000 Units total) by mouth every 7 (seven) days. When finished, take an otc daily vitamin d supplement of 5,000iu. (Patient taking differently: Take 50,000 Units by mouth. Take twice weekly.  WHenfinished, take an otc daily vitamin d supplement of 5,000iu.) 12 capsule 0  . fluticasone (FLONASE) 50 MCG/ACT nasal spray Place 2 sprays into both nostrils every morning.     . Ibuprofen (ADVIL) 200 MG CAPS Take by mouth as needed.     No facility-administered medications prior to visit.     PAST MEDICAL HISTORY: Past Medical History:  Diagnosis Date  . Arthritis    back  . Dysesthesia    bilateral feet  . Family history of adverse reaction to anesthesia    mother -- ponv  . Fatigue   . Fecal incontinence   . Frequent falls    due to MS  . GERD (gastroesophageal reflux disease)   . Heart murmur   . History of colon polyps    2013;  2015  . 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 Migraines    right side   . Mitral valve prolapse    states no problem  . Multiple sclerosis [facility]) 1989   neurologist-  dr Felecia Shelling  . Neurogenic bladder   . Osteoporosis   . PONV (postoperative nausea and vomiting)   . Pulmonary nodule, right  pulmologist-  dr Milinda Hirschfeld--  stable per ct 05-17-2016  . Short of breath on exertion   . Sjogren's disease ([place])    dryness of eyes, mouth  . Spastic gait   . Transverse myelitis ([place])    T4  . Wears bilateral ankle braces    AFO braces for stability    PAST SURGICAL HISTORY: Past Surgical History:  Procedure Laterality Date  . ANAL RECTAL MANOMETRY N/A 08/18/2016   Procedure: ANO  RECTAL MANOMETRY;  Surgeon: Leighton Ruff, MD;  Location: [facility];  Service: Endoscopy;  Laterality: N/A;  . ANTERIOR CERVICAL DECOMP/DISCECTOMY FUSION  2004;   2003   C4 -- C5 (2004)/   C3 -- C4 (2003)  . APPENDECTOMY  06/22/2011   laparoscopic  . BILATERAL SALPINGOOPHORECTOMY  1982  . BLADDER SUSPENSION  1994   sling  . BREAST ENHANCEMENT SURGERY Bilateral   . BREAST IMPLANT REMOVAL Bilateral 12/03/2008  . BUNIONECTOMY    . CATARACT EXTRACTION W/ INTRAOCULAR LENS  IMPLANT, BILATERAL  2015  . ELBOW SURGERY Right   . HEMORROIDECTOMY  08/05/2010;  2013  . INSERTION SACRAL NERVE STIMULATOR TEST WIRE  09-28-2016   dr Marcello Moores  . INTERCOSTAL NERVE BLOCK  2015   occipital  . RECTAL ULTRASOUND N/A 08/18/2016   Procedure: RECTAL ULTRASOUND;  Surgeon: Leighton Ruff, MD;  Location: [facility];  Service: Endoscopy;  Laterality: N/A;  . SHOULDER ARTHROSCOPY Left   . SHOULDER ARTHROSCOPY WITH DISTAL CLAVICLE RESECTION Right 09/21/2007   w/  Acrominoplasty,  Debridement Rotator Cuff,  CA ligament release  . TONSILLECTOMY  age 37  . TRIGGER FINGER RELEASE Left 01/16/2015   Procedure: LEFT THUMB TRIGGER RELEASE ;  Surgeon: Leanora Cover, MD;  Location: [facility];  Service: Orthopedics;  Laterality: Left;  Marland Kitchen VAGINAL HYSTERECTOMY  1981    FAMILY HISTORY: Family History  Problem Relation Age of Onset  . Liver disease Father   . Cirrhosis Father   . Stroke Mother   . Cancer Mother     oropharyngeal  . Heart attack Mother   . Anesthesia problems Mother     post-op nausea  . Cancer Brother     twin; tonsillar?  . Cancer Sister 56    breast ca, lung ca  . Cancer Other     Nephew; appendiceal carcnoid, melanoma  . Cancer Paternal Grandmother     cervical    SOCIAL HISTORY:  Social History   Social History  . Marital status: Married    Spouse name: Gershon Mussel  . Number of children: 2  . Years of education: college   Occupational History  . Retired    Social History Main  Topics  . Smoking status: Passive Smoke Exposure - Never Smoker  . Smokeless tobacco: Never Used     Comment: parents, husband, & multiple family members  . Alcohol use No  . Drug use: No  . Sexual activity: Not on file   Other Topics Concern  . Not on file   Social History Narrative   Pt lives at home with her spouse of 64 years.   Caffeine Use- Drinks caffeine occasionally.      [facility]:   From [place]. Previously did administrative work. She currently has a cat & dog. No bird exposure. No mold or hot tub exposure. No carpet or draperies.            PHYSICAL EXAM  Vitals:   11/08/16 0836  BP: (!) 142/90  Pulse: 80  Resp: 14  Weight: 147 lb (66.7 kg)  Height: 5\' 10"  (1.778 m)    Body mass index is 21.09 kg/m.   General: The patient is well-developed and well-nourished and in no acute distress  Neck:   There is no significant neck tenderness now.  Good ROM  Mildly tender over left shoulder with reduced ROM  Neurologic Exam  Mental status: The patient is alert and oriented at the time of the examination. She scored 22/30 on the module cognitive assessment test missing 2 points for visual spatial/executive, 1 point for attention, 1 pont for language repetition and 3 points (2/5) for delayed recall. Of note, with cues she got 5/5 on recall.   Speech is normal.  Cranial nerves: Extraocular movements are full.    Colors are desaturated OS  There is good facial sensation to soft touch bilaterally.Facial strength is normal.  Trapezius and sternocleidomastoid strength is normal. No dysarthria is noted.    Motor:  Muscle bulk and tone are normal. Strength is  4+/ 5 in the left and 5/5 in the right arm.   Leg strength is 4/5 and ankle dorsiflexion 4-/5 EHL, slightly worse on left  Sensory: Sensory testing shows symmetric arm sensation to touch and vibration but reduced touch sensation below T5 and decreased vibration in left leg.     Coordination: Cerebellar testing  reveals reduced left RAM and left finger-nose-finger and she has difficulty doing heel-to-shin, on either side.  Gait and station: Station is stable eyes open.  Gait is spastic.  She is walking better with the new AFOs she can walk without her cane. .   She can not tandem.  Romberg positive  Reflexes: Deep tendon reflexes are symmetric and normal in arms, 3+ at knees.       DIAGNOSTIC DATA (LABS, IMAGING, TESTING) - I reviewed patient records, labs, notes, testing and imaging myself where available.     ASSESSMENT AND PLAN  MULTIPLE SCLEROSIS  Transverse myelitis ([place]) - Plan: Vitamin B12  Other fatigue - Plan: Vitamin B12, TSH  Spastic gait - Plan: Vitamin B12  Occipital neuralgia of right side  Urinary frequency  Memory loss - Plan: Vitamin B12, TSH  Neck pain     1.  Memory difficulties appear to be due more to decreased focus and attention than to a storage problems such as Alzheimer's.   Add Adderall XR 15 mg 1 by mouth every morning to help with her attentional and focus difficulties.  If symptoms worsen, consider repeat imaging the MRI of the brain 2.   She will wear the bilateral AFOs daily. 3.   Change Tylenol PM to imipramine 25 mg by mouth nightly. 4.    Check TSH, B12 Continue lamotrigine twice a day for dysesthesias.   Continue compounded 4-AP for improved gait/strength. 5.    Right splenius capitis trigger point injection with 80 mg Depo-Medrol in [place] using sterile technique. She tolerated the procedure well and there were no complications. RTC 4-6 months, sooner if problems  45 minutes face-to-face evaluation with greater than one of the time counseling or coordinating care about her MS and new symptoms of memory loss.   Richard A. Felecia Shelling, MD, PhD [id number], [id number] PM Certified in Neurology, Clinical Neurophysiology, Sleep Medicine, Pain Medicine and Neuroimaging  [facility] [address], [place], [place] [address] [phone]

## 2016-11-09 ENCOUNTER — Telehealth: Payer: Self-pay | Admitting: Neurology

## 2016-11-09 LAB — TSH: TSH: 2.65 u[IU]/mL (ref 0.450–4.500)

## 2016-11-09 LAB — VITAMIN B12: Vitamin B-12: 591 pg/mL (ref 211–946)

## 2016-11-09 NOTE — Telephone Encounter (Signed)
Spoke with [name] this morning and advised she should continue Lamictal and Requip.  She verbalized understanding of same/fim

## 2016-11-09 NOTE — Telephone Encounter (Signed)
Pt called wanting to know if she is to continue taking lamoTRIgine (LAMICTAL) 150 MG tablet and rOPINIRole (REQUIP) 0.25 MG tablet ? Sts she thinks she is but wants to make sure.

## 2016-11-09 NOTE — Telephone Encounter (Signed)
Fax received from [employer]	[place] phone# [phone].  Adderall approved 11-08-16 thru 12-26-16./fim

## 2016-11-12 ENCOUNTER — Telehealth: Payer: Self-pay | Admitting: *Deleted

## 2016-11-12 NOTE — Telephone Encounter (Signed)
LMOM (identified vm) that per RAS, Vit. B12 and Thyroid labs done in our office were normal.  She does not need to return this call unless she has questions/fim

## 2016-11-12 NOTE — Telephone Encounter (Signed)
-----   Message from Britt Bottom, MD sent at 11/09/2016  5:26 PM EST ----- Please let her know that the vitamin B12 and thyroid tests are normal.

## 2016-11-15 ENCOUNTER — Ambulatory Visit: Payer: PPO | Admitting: Neurology

## 2016-12-06 DIAGNOSIS — Z1231 Encounter for screening mammogram for malignant neoplasm of breast: Secondary | ICD-10-CM | POA: Diagnosis not present

## 2016-12-06 DIAGNOSIS — Z803 Family history of malignant neoplasm of breast: Secondary | ICD-10-CM | POA: Diagnosis not present

## 2016-12-09 ENCOUNTER — Ambulatory Visit (INDEPENDENT_AMBULATORY_CARE_PROVIDER_SITE_OTHER): Payer: PPO | Admitting: Neurology

## 2016-12-09 ENCOUNTER — Telehealth: Payer: Self-pay | Admitting: *Deleted

## 2016-12-09 ENCOUNTER — Encounter: Payer: Self-pay | Admitting: Neurology

## 2016-12-09 VITALS — BP 100/68 | HR 66 | Resp 16 | Ht 70.0 in | Wt 140.0 lb

## 2016-12-09 DIAGNOSIS — F988 Other specified behavioral and emotional disorders with onset usually occurring in childhood and adolescence: Secondary | ICD-10-CM

## 2016-12-09 DIAGNOSIS — M5481 Occipital neuralgia: Secondary | ICD-10-CM | POA: Diagnosis not present

## 2016-12-09 DIAGNOSIS — M542 Cervicalgia: Secondary | ICD-10-CM

## 2016-12-09 DIAGNOSIS — E559 Vitamin D deficiency, unspecified: Secondary | ICD-10-CM

## 2016-12-09 DIAGNOSIS — R350 Frequency of micturition: Secondary | ICD-10-CM | POA: Diagnosis not present

## 2016-12-09 DIAGNOSIS — G35 Multiple sclerosis: Secondary | ICD-10-CM

## 2016-12-09 DIAGNOSIS — G373 Acute transverse myelitis in demyelinating disease of central nervous system: Secondary | ICD-10-CM

## 2016-12-09 DIAGNOSIS — R5383 Other fatigue: Secondary | ICD-10-CM | POA: Diagnosis not present

## 2016-12-09 HISTORY — DX: Vitamin D deficiency, unspecified: E55.9

## 2016-12-09 MED ORDER — AMPHETAMINE-DEXTROAMPHET ER 15 MG PO CP24: 15.0000 mg | ORAL_CAPSULE | ORAL | 0 refills | Status: DC

## 2016-12-09 NOTE — Progress Notes (Signed)
[facility]  PATIENT: [name] DOB: [birth year]  REFERRING CLINICIAN: Crist Infante HISTORY FROM: patient REASON FOR VISIT: transverse myelitis, MS?   HISTORICAL  CHIEF COMPLAINT:  Chief Complaint  Patient presents with  . Transverse Myelitis    Denies new or worsening sx. Sts. memory is some imporved since starting Adderall for focus/concentration.  She had a stimulator placed for bowel/bladder incontinence 2-3 mos. ago and sts. it is working well.  Her husband was dx. with prostate cancer yesterday,/fim  . Memory Loss    HISTORY OF PRESENT ILLNESS:  [name] is a 65 yo woman with Multiple Sclerosis.   She feels more alert and better focused on Adderall.   Her occipital HA has returned.     Right sided headaches and neck pain:   She reports tha the pain in the right neck and occiput has returned.   She was better after the occipital nerve block and trigger point injections in the past.   Pain radiates forward to the right vertex.         Transverse myelitis/gait/strength/sensation/bladder:    She has a history of a T4 transverse myelitis.  She walks better with the new AFO braces.   However, she finds it harder to climb stairs when she wears them.       She is on  Ampyra 5 mg po tid (compounded) and tolerates it well.   Lamotrigine is helping her burning dysesthesias in both feet and hands.  Addition of imipramine has helped some.    Lyrica and gabapentin only helped a bit.    Burning is worse at night.      Vision:   Earlier in 2017, she had a possible optic neuritis exacerbation.  She sees colors better out of the right eye than the left eye.       She had macular edema on Gilenya that improved after stopping it.      Bladder:  She has urinary frequency and incontinence since the early 1990s but is much better since a stim surgery.      Memory:   She feels memory issues improved after starting Adderall and she is much better focused now.      Sleep:    She is sleeping better with less nocturia since starting imipramine.  .  Fatigue/mood:  She feels fatigue is sometimes a problem but is better on Adderall.      She denies depression or anxiety.  She sometimes has difficulty coming up with the right words. She has had difficulty multitasking and completing tasks.       Vit D deficiency:  Since the beginning of the year she has been on 50,000 units twice a week. She does not think that the vitamin D has been rechecked  TM/MS History:  In 1989, she had severe numbness in one side of her body.  She had clumsiness and poor gait.  MRI was reportedly normal.     She saw Dr. Erling Cruz who did an LP that showed oligoclonal bands.   She received IV steroids and was told she had MS.   In 1993, she woke up numb from the waist down in both legs.  This numbness was more intense than the prior episodes and gait was very poor.    She was admitted x 28 days, receiving many days of IV steroids followed by Rehab.     An MRI of the brain was normal but the MRI of the thoracic  spine showed a plaque at T4.     She was started on Betaseron and did well in general.  She had some fluctuating symptom but nothing resembling any of the other 3 episodes.   In November 2014 she had additional imaging studies showing a normal MRI of the brain, an abnormal MRI of the thoracic spine with a focus at T4, and a normal cervical spinal cord. She did have evidence of prior fusion surgery in the neck.  MRI of the cervical spine in September 2016 showed stable postoperative ACDF at C4-C5. The spinal cord appeared normal.   There was no myelopathy in the cervical spine. She also underwent a lumbar puncture in November 2014. It was normal and did not show oligoclonal bands or increased IgG index.    She has been treated with Betaseron, Avonex, Tysabri and Gilenya for presumptive MS in the past.   She stopped Tysabri as was JCV Ab positive and stopped Gilenya as she had macula edema.   She  tried [place] but stopped due to insurance.  No DMT since 2014.       REVIEW OF SYSTEMS:  Constitutional: No fevers, chills, sweats, or change in .  She reports fatigue Eyes: as above Ear, nose and throat: No hearing loss, ear pain, nasal congestion, sore throat Cardiovascular: No chest pain, palpitations Respiratory:  No shortness of breath at rest or with exertion.   No wheezes GastrointestinaI: No nausea, vomiting, diarrhea, abdominal pain.  Reports fecal incontinence.   Reports dysphagia Genitourinary:  see above Musculoskeletal:  No neck pain, back pain.   Hasmuscle cramps Integumentary: No rash, pruritus, skin lesions Neurological: as above Psychiatric: No depression at this time.  No anxiety Endocrine: No palpitations, diaphoresis, change in appetite, change in weigh or increased thirst Hematologic/Lymphatic:  No anemia, purpura, petechiae. Allergic/Immunologic: No itchy/runny eyes, nasal congestion, recent allergic reactions, rashes  ALLERGIES: Allergies  Allergen Reactions  . Other Shortness Of Breath    PURPLE LETTUCE  . Betaseron [Interferon Beta-1b] Other (See Comments)    HARD NODULAR AREAS AT INJECTION SITE  . Codeine Nausea And Vomiting  . Gilenya [Fingolimod Hydrochloride] Other (See Comments)    MACULAR EDEMA  . Morphine And Related Other (See Comments)    IV ROUTE - RED STREAKS OF VEIN  . Sumatriptan Nausea Only    INJECTABLE ONLY - RAPID HEART RATE, FLUSHING  . Tysabri [Natalizumab] Other (See Comments)    DEVELOPED JCV ANTIBODY  . Latex Rash    HOME MEDICATIONS: Outpatient Medications Prior to Visit  Medication Sig Dispense Refill  . acetaminophen (TYLENOL) 500 MG tablet Take 500 mg by mouth every 6 (six) hours as needed.    . Biotin 10000 MCG TABS Take 1 tablet by mouth daily.    . calcium carbonate (OS-CAL) 600 MG TABS tablet Take 1,200 mg by mouth.     . Dalfampridine (4-AMINOPYRIDINE) POWD by Does not apply route.    Marland Kitchen dalfampridine 10 MG TB12 Take  1 tablet (10 mg total) by mouth 3 (three) times daily. Dose change (Patient taking differently: Take 5 mg by mouth 3 (three) times daily. Dose change - 5 mg tid) 90 tablet 11  . denosumab (PROLIA) 60 MG/ML SOLN injection Inject 60 mg into the skin every 6 (six) months. Administer in upper arm, thigh, or abdomen    . fluticasone (FLONASE) 50 MCG/ACT nasal spray Place 2 sprays into both nostrils every morning.     . Ibuprofen (ADVIL) 200 MG CAPS Take by  mouth as needed.    Marland Kitchen imipramine (TOFRANIL) 25 MG tablet Take 1 tablet (25 mg total) by mouth at bedtime. 30 tablet 5  . lamoTRIgine (LAMICTAL) 150 MG tablet One po bid 60 tablet 11  . loratadine (CLARITIN) 10 MG tablet Take 10 mg by mouth every morning.     . Meclizine HCl 25 MG CHEW Chew 2 tablets by mouth as needed (dizziness).     . Multiple Vitamins-Minerals (MULTIVITAMIN PO) Take by mouth. Brand name - Mature Multivitamin from [employer]    . pantoprazole (PROTONIX) 40 MG tablet Take 1 tablet (40 mg total) by mouth every morning. (Patient taking differently: Take 40 mg by mouth 2 (two) times daily. ) 30 tablet 3  . rOPINIRole (REQUIP) 0.25 MG tablet TAKE 1 TO 2 TABLETS BY MOUTH AT NIGHT 60 tablet 9  . Vitamin D, Ergocalciferol, (DRISDOL) 50000 UNITS CAPS capsule Take 1 capsule (50,000 Units total) by mouth every 7 (seven) days. When finished, take an otc daily vitamin d supplement of 5,000iu. (Patient taking differently: Take 50,000 Units by mouth. Take twice weekly.  WHenfinished, take an otc daily vitamin d supplement of 5,000iu.) 12 capsule 0  . amphetamine-dextroamphetamine (ADDERALL XR) 15 MG 24 hr capsule Take 1 capsule by mouth every morning. 30 capsule 0  . diphenhydramine-acetaminophen (TYLENOL PM) 25-500 MG TABS Take 2 tablets by mouth at bedtime as needed (sleep). Reported on 01/13/2016     No facility-administered medications prior to visit.     PAST MEDICAL HISTORY: Past Medical History:  Diagnosis Date  . Arthritis    back  .  Dysesthesia    bilateral feet  . Family history of adverse reaction to anesthesia    mother -- ponv  . Fatigue   . Fecal incontinence   . Frequent falls    due to MS  . GERD (gastroesophageal reflux disease)   . Heart murmur   . History of colon polyps    2013;  2015  . 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 Migraines    right side   . Mitral valve prolapse    states no problem  . Multiple sclerosis [facility]) 1989   neurologist-  dr Felecia Shelling  . Neurogenic bladder   . Osteoporosis   . PONV (postoperative nausea and vomiting)   . Pulmonary nodule, right    pulmologist-  dr Milinda Hirschfeld--  stable per ct 05-17-2016  . Short of breath on exertion   . Sjogren's disease ([place])    dryness of eyes, mouth  . Spastic gait   . Transverse myelitis ([place])    T4  . Wears bilateral ankle braces    AFO braces for stability    PAST SURGICAL HISTORY: Past Surgical History:  Procedure Laterality Date  . ANAL RECTAL MANOMETRY N/A 08/18/2016   Procedure: ANO RECTAL MANOMETRY;  Surgeon: Leighton Ruff, MD;  Location: [facility];  Service: Endoscopy;  Laterality: N/A;  . ANTERIOR CERVICAL DECOMP/DISCECTOMY FUSION  2004;   2003   C4 -- C5 (2004)/   C3 -- C4 (2003)  . APPENDECTOMY  06/22/2011   laparoscopic  . BILATERAL SALPINGOOPHORECTOMY  1982  . BLADDER SUSPENSION  1994   sling  . BREAST ENHANCEMENT SURGERY Bilateral   . BREAST IMPLANT REMOVAL Bilateral 12/03/2008  . BUNIONECTOMY    . CATARACT EXTRACTION W/ INTRAOCULAR LENS  IMPLANT, BILATERAL  2015  . ELBOW SURGERY Right   .  HEMORROIDECTOMY  08/05/2010;  2013  . INSERTION SACRAL NERVE STIMULATOR TEST WIRE  09-28-2016   dr Marcello Moores  . INTERCOSTAL NERVE BLOCK  2015   occipital  . RECTAL ULTRASOUND N/A 08/18/2016   Procedure: RECTAL ULTRASOUND;  Surgeon: Leighton Ruff, MD;  Location: [facility];  Service: Endoscopy;  Laterality: N/A;  . SHOULDER ARTHROSCOPY Left   . SHOULDER  ARTHROSCOPY WITH DISTAL CLAVICLE RESECTION Right 09/21/2007   w/  Acrominoplasty,  Debridement Rotator Cuff,  CA ligament release  . TONSILLECTOMY  age 26  . TRIGGER FINGER RELEASE Left 01/16/2015   Procedure: LEFT THUMB TRIGGER RELEASE ;  Surgeon: Leanora Cover, MD;  Location: [facility];  Service: Orthopedics;  Laterality: Left;  Marland Kitchen VAGINAL HYSTERECTOMY  1981    FAMILY HISTORY: Family History  Problem Relation Age of Onset  . Liver disease Father   . Cirrhosis Father   . Stroke Mother   . Cancer Mother     oropharyngeal  . Heart attack Mother   . Anesthesia problems Mother     post-op nausea  . Cancer Brother     twin; tonsillar?  . Cancer Sister 21    breast ca, lung ca  . Cancer Other     Nephew; appendiceal carcnoid, melanoma  . Cancer Paternal Grandmother     cervical    SOCIAL HISTORY:  Social History   Social History  . Marital status: Married    Spouse name: Gershon Mussel  . Number of children: 2  . Years of education: college   Occupational History  . Retired    Social History Main Topics  . Smoking status: Passive Smoke Exposure - Never Smoker  . Smokeless tobacco: Never Used     Comment: parents, husband, & multiple family members  . Alcohol use No  . Drug use: No  . Sexual activity: Not on file   Other Topics Concern  . Not on file   Social History Narrative   Pt lives at home with her spouse of 21 years.   Caffeine Use- Drinks caffeine occasionally.      [facility]:   From [place]. Previously did administrative work. She currently has a cat & dog. No bird exposure. No mold or hot tub exposure. No carpet or draperies.            PHYSICAL EXAM  Vitals:   12/09/16 0828  BP: 100/68  Pulse: 66  Resp: 16  Weight: 140 lb (63.5 kg)  Height: 5\' 10"  (1.778 m)    Body mass index is 20.09 kg/m.   General: The patient is well-developed and well-nourished and in no acute distress  Neck:   There is neck tenderness at the splenius  capitus/occipital nerve and trapezius on the right.  Good ROM  Mildly tender over left shoulder with reduced ROM  Neurologic Exam  Mental status: The patient is alert and oriented at the time of the examination. Reduced attention, better STM  WORLD-DLROW, [id number].   STM 3/3 without prompts  Speech is normal.  Cranial nerves: Extraocular movements are full.    Colors are desaturated OS  There is good facial sensation to soft touch bilaterally.Facial strength is normal.  Trapezius and sternocleidomastoid strength is normal. No dysarthria is noted.    Motor:  Muscle bulk and tone are normal. Strength is  4+/ 5 in the left and 5/5 in the right arm.   Leg strength is 4/5 and ankle dorsiflexion 4-/5 EHL, slightly worse on left  Sensory:  Sensory testing shows symmetric arm sensation to touch and vibration but reduced touch sensation below T5 and decreased vibration in left leg.     Coordination: Cerebellar testing shows mildly reduced left RAM and left finger-nose-finger.  Gait and station: Station is stable eyes open.  Gait is spastic.  She has a foot drop bilaterally.    She can not tandem.  Romberg positive  Reflexes: Deep tendon reflexes are symmetric and normal in arms, 3+ at knees.       DIAGNOSTIC DATA (LABS, IMAGING, TESTING) - I reviewed patient records, labs, notes, testing and imaging myself where available.     ASSESSMENT AND PLAN  MULTIPLE SCLEROSIS - Plan: VITAMIN D 25 Hydroxy (Vit-D Deficiency, Fractures)  Transverse myelitis (HCC)  Urinary frequency  Other fatigue  Neck pain  Occipital neuralgia of right side  Vitamin D deficiency - Plan: VITAMIN D 25 Hydroxy (Vit-D Deficiency, Fractures)    1.   Right splenius capitis, C6C7 paraspinal and trapezius trigger point injection with 80 mg Depo-Medrol in [place] using sterile technique. She tolerated the procedure well and there were no complications. 2.  Continue Adderall XR 15 mg 1 by mouth every morning to  help with her attentional and focus difficulties.   3.  She will wear the bilateral AFOs daily as needed 4.   Check Vit D adjust dose if needed RTC 4 months, sooner if problems   Richard A. Felecia Shelling, MD, PhD [id number], [id number] AM Certified in Neurology, Clinical Neurophysiology, Sleep Medicine, Pain Medicine and Neuroimaging  [facility] [address] [place], [place] [address] [phone]

## 2016-12-09 NOTE — Telephone Encounter (Signed)
amphetamine-dextroamphetamine (ADDERALL XR) 15 MG 24 hr capsule  Please [facility] prescrip to [facility] [address]

## 2016-12-09 NOTE — Telephone Encounter (Signed)
I have spoken with [name] this afternoon.  RAS gave her an Adderall rx. at her ov today.  She asked if this could be sent electronically to the pharmacy, and I have explained that it can't be--she must take the actual rx. in to be filled/fim

## 2016-12-10 ENCOUNTER — Telehealth: Payer: Self-pay | Admitting: *Deleted

## 2016-12-10 LAB — VITAMIN D 25 HYDROXY (VIT D DEFICIENCY, FRACTURES): VIT D 25 HYDROXY: 99.8 ng/mL (ref 30.0–100.0)

## 2016-12-10 NOTE — Telephone Encounter (Signed)
I have spoken with [name] this morning and per RAS, advised that Vit. D level is now normal; she can decrease to 50,000iu Vit D once a week instead of twice per week.  She verbalized understanding of same/fim

## 2016-12-10 NOTE — Telephone Encounter (Signed)
-----   Message from Britt Bottom, MD sent at 12/10/2016 10:33 AM EST ----- Vitamin D is now in the high normal range. She can step back to just one of the high-dose pill a week instead of 2.

## 2017-01-10 ENCOUNTER — Telehealth: Payer: Self-pay | Admitting: Neurology

## 2017-01-10 MED ORDER — AMPHETAMINE-DEXTROAMPHET ER 15 MG PO CP24: 15.0000 mg | ORAL_CAPSULE | ORAL | 0 refills | Status: DC

## 2017-01-10 NOTE — Telephone Encounter (Signed)
Patient requesting refill of amphetamine-dextroamphetamine (ADDERALL XR) 15 MG 24 hr capsule. ° ° °

## 2017-01-10 NOTE — Addendum Note (Signed)
Addended by: France Ravens I on: 01/10/2017 09:16 AM   Modules accepted: Orders

## 2017-01-10 NOTE — Telephone Encounter (Signed)
Adderall rx. up front [facility]/fim 

## 2017-01-10 NOTE — Telephone Encounter (Signed)
Rx. awaiting RAS sig/fim 

## 2017-01-14 DIAGNOSIS — J019 Acute sinusitis, unspecified: Secondary | ICD-10-CM | POA: Diagnosis not present

## 2017-01-14 DIAGNOSIS — J209 Acute bronchitis, unspecified: Secondary | ICD-10-CM | POA: Diagnosis not present

## 2017-01-14 DIAGNOSIS — R509 Fever, unspecified: Secondary | ICD-10-CM | POA: Diagnosis not present

## 2017-01-14 DIAGNOSIS — R05 Cough: Secondary | ICD-10-CM | POA: Diagnosis not present

## 2017-01-14 DIAGNOSIS — Z6820 Body mass index (BMI) 20.0-20.9, adult: Secondary | ICD-10-CM | POA: Diagnosis not present

## 2017-01-14 DIAGNOSIS — K219 Gastro-esophageal reflux disease without esophagitis: Secondary | ICD-10-CM | POA: Diagnosis not present

## 2017-02-01 ENCOUNTER — Telehealth: Payer: Self-pay | Admitting: *Deleted

## 2017-02-01 MED ORDER — IMIPRAMINE HCL 25 MG PO TABS: 25.0000 mg | ORAL_TABLET | Freq: Every day | ORAL | 3 refills | Status: DC

## 2017-02-01 NOTE — Telephone Encounter (Signed)
Request received for 90 day supply of Imipramine.  [facility]fim

## 2017-02-03 ENCOUNTER — Telehealth: Payer: Self-pay | Admitting: Neurology

## 2017-02-03 MED ORDER — AMPHETAMINE-DEXTROAMPHET ER 15 MG PO CP24: 15.0000 mg | ORAL_CAPSULE | ORAL | 0 refills | Status: DC

## 2017-02-03 NOTE — Telephone Encounter (Signed)
Rx. up front [facility]/fim 

## 2017-02-03 NOTE — Telephone Encounter (Signed)
Pt request refill for amphetamine-dextroamphetamine (ADDERALL XR) 15 MG 24 hr capsule . Pt is out of the medication. Says her household has been very sick and she has missed taking the medication.

## 2017-02-03 NOTE — Telephone Encounter (Signed)
Rx. awaiting RAS sig/fim 

## 2017-02-03 NOTE — Addendum Note (Signed)
Addended by: France Ravens I on: 02/03/2017 03:46 PM   Modules accepted: Orders

## 2017-03-14 ENCOUNTER — Telehealth: Payer: Self-pay | Admitting: Neurology

## 2017-03-14 MED ORDER — AMPHETAMINE-DEXTROAMPHET ER 15 MG PO CP24: 15.0000 mg | ORAL_CAPSULE | ORAL | 0 refills | Status: DC

## 2017-03-14 NOTE — Telephone Encounter (Signed)
Rx. awaiting RAS sig/fim 

## 2017-03-14 NOTE — Addendum Note (Signed)
Addended by: France Ravens I on: 03/14/2017 02:20 PM   Modules accepted: Orders

## 2017-03-14 NOTE — Telephone Encounter (Signed)
Rx. up front [facility]/fim 

## 2017-03-14 NOTE — Telephone Encounter (Signed)
Patient called office requesting refill for amphetamine-dextroamphetamine (ADDERALL XR) 15 MG 24 hr capsule °

## 2017-03-22 ENCOUNTER — Telehealth: Payer: Self-pay | Admitting: *Deleted

## 2017-03-22 NOTE — Telephone Encounter (Signed)
Handicap Parking Placard Application completed and mailed to pt's home address per her request/fim

## 2017-04-13 ENCOUNTER — Ambulatory Visit (INDEPENDENT_AMBULATORY_CARE_PROVIDER_SITE_OTHER): Payer: PPO | Admitting: Neurology

## 2017-04-13 ENCOUNTER — Encounter: Payer: Self-pay | Admitting: Neurology

## 2017-04-13 VITALS — BP 139/75 | HR 106 | Resp 16 | Ht 70.0 in | Wt 134.0 lb

## 2017-04-13 DIAGNOSIS — G373 Acute transverse myelitis in demyelinating disease of central nervous system: Secondary | ICD-10-CM

## 2017-04-13 DIAGNOSIS — M5481 Occipital neuralgia: Secondary | ICD-10-CM

## 2017-04-13 DIAGNOSIS — R5383 Other fatigue: Secondary | ICD-10-CM

## 2017-04-13 DIAGNOSIS — M542 Cervicalgia: Secondary | ICD-10-CM | POA: Diagnosis not present

## 2017-04-13 DIAGNOSIS — F988 Other specified behavioral and emotional disorders with onset usually occurring in childhood and adolescence: Secondary | ICD-10-CM

## 2017-04-13 DIAGNOSIS — G35 Multiple sclerosis: Secondary | ICD-10-CM | POA: Diagnosis not present

## 2017-04-13 MED ORDER — AMPHETAMINE-DEXTROAMPHET ER 15 MG PO CP24: 15.0000 mg | ORAL_CAPSULE | ORAL | 0 refills | Status: DC

## 2017-04-13 NOTE — Progress Notes (Signed)
[facility]  PATIENT: [name] DOB: Apr 27, 1951  REFERRING CLINICIAN: Crist Infante HISTORY FROM: patient REASON FOR VISIT: transverse myelitis, MS?   HISTORICAL  CHIEF COMPLAINT:  Chief Complaint  Patient presents with  . Multiple Sclerosis    Believes walking is improved with compunded 4-AP that she gets from [place] here in [place].  Sts. has more energy since starting Adderall, but still feels she is having trouble with memory.  She is not using bilat AFO's right now b/c she has pain bottoms of bilat feet and braces make it worse.  She needs a r/f of Adderall/fim    HISTORY OF PRESENT ILLNESS:  [name] is a 66 yo woman with Multiple Sclerosis.     Transverse myelitis/gait/strength/sensation/bladder:   She reports no change in her mild leg weakness.   She has a history of a T4 transverse myelitis.  She walks better with the new AFO braces. The brace hurts at times.   She also notes it is harder to climb stairs when she wears them.       She is on  Ampyra 5 mg po tid (compounded) and tolerates it well and notes improvement.   Lamotrigine with imipramine  is helping her burning dysesthesias in both feet and hands.       Lyrica and gabapentin only helped a bit.    Burning is worse at night.      Vision:   Earlier in 2017, she had a possible optic neuritis exacerbation.  She feels she is back to baseline.  Color vision is more symmetric.    She had macular edema on Gilenya that improved after stopping it.      Bladder:  She has urinary frequency and incontinence since the early 1990s but this is much better since a stim surgery.      Memory:   Memory and focus improved after starting Adderall. She tolerates it well. It is not negatively affecting her sleep.   Sleep:    She is sleeping better with less nocturia since starting imipramine.  .  Fatigue/mood:  She feels fatigue is better on Adderall.    She denies depression or anxiety.   She sometimes has difficulty coming up with the right words. She has had difficulty multitasking and completing tasks.        Right sided headaches and neck pain:   The right neck and occiput improved after the occipital nerve block/splenius capitus muscle injection last visit.  She did well for about 2 months but then the headache returned. She would like to have another shot if possible.            Tremor:   She notes a tremor in her hands, worse when she writes.   She has not noted any association with stress or mood.    Her aunt has tremor in her jaw and hands.     TM/MS History:  In 1989, she had severe numbness in one side of her body.  She had clumsiness and poor gait.  MRI was reportedly normal.     She saw Dr. Erling Cruz who did an LP that showed oligoclonal bands.   She received IV steroids and was told she had MS.   In 1993, she woke up numb from the waist down in both legs.  This numbness was more intense than the prior episodes and gait was very poor.    She was admitted x 28 days, receiving many days  of IV steroids followed by Rehab.     An MRI of the brain was normal but the MRI of the thoracic spine showed a plaque at T4.     She was started on Betaseron and did well in general.  She had some fluctuating symptom but nothing resembling any of the other 3 episodes.   In November 2014 she had additional imaging studies showing a normal MRI of the brain, an abnormal MRI of the thoracic spine with a focus at T4, and a normal cervical spinal cord. She did have evidence of prior fusion surgery in the neck.  MRI of the cervical spine in September 2016 showed stable postoperative ACDF at C4-C5. The spinal cord appeared normal.   There was no myelopathy in the cervical spine. She also underwent a lumbar puncture in November 2014. It was normal and did not show oligoclonal bands or increased IgG index.    She has been treated with Betaseron, Avonex, Tysabri and Gilenya for presumptive MS in the past.   She  stopped Tysabri as was JCV Ab positive and stopped Gilenya as she had macula edema.   She tried [place] but stopped due to insurance.  No DMT since 2014.       REVIEW OF SYSTEMS:  Constitutional: No fevers, chills, sweats, or change in .  She reports fatigue Eyes: as above Ear, nose and throat: No hearing loss, ear pain, nasal congestion, sore throat Cardiovascular: No chest pain, palpitations Respiratory:  No shortness of breath at rest or with exertion.   No wheezes GastrointestinaI: No nausea, vomiting, diarrhea, abdominal pain.  Reports fecal incontinence.   Reports dysphagia Genitourinary:  see above Musculoskeletal:  No neck pain, back pain.   Hasmuscle cramps Integumentary: No rash, pruritus, skin lesions Neurological: as above Psychiatric: No depression at this time.  No anxiety Endocrine: No palpitations, diaphoresis, change in appetite, change in weigh or increased thirst Hematologic/Lymphatic:  No anemia, purpura, petechiae. Allergic/Immunologic: No itchy/runny eyes, nasal congestion, recent allergic reactions, rashes  ALLERGIES: Allergies  Allergen Reactions  . Other Shortness Of Breath    PURPLE LETTUCE  . Betaseron [Interferon Beta-1b] Other (See Comments)    HARD NODULAR AREAS AT INJECTION SITE  . Codeine Nausea And Vomiting  . Gilenya [Fingolimod Hydrochloride] Other (See Comments)    MACULAR EDEMA  . Morphine And Related Other (See Comments)    IV ROUTE - RED STREAKS OF VEIN  . Sumatriptan Nausea Only    INJECTABLE ONLY - RAPID HEART RATE, FLUSHING  . Tysabri [Natalizumab] Other (See Comments)    DEVELOPED JCV ANTIBODY  . Latex Rash    HOME MEDICATIONS: Outpatient Medications Prior to Visit  Medication Sig Dispense Refill  . acetaminophen (TYLENOL) 500 MG tablet Take 500 mg by mouth every 6 (six) hours as needed.    . Biotin 10000 MCG TABS Take 1 tablet by mouth daily.    . calcium carbonate (OS-CAL) 600 MG TABS tablet Take 1,200 mg by mouth.     .  Dalfampridine (4-AMINOPYRIDINE) POWD by Does not apply route.    Marland Kitchen denosumab (PROLIA) 60 MG/ML SOLN injection Inject 60 mg into the skin every 6 (six) months. Administer in upper arm, thigh, or abdomen    . Ibuprofen (ADVIL) 200 MG CAPS Take by mouth as needed.    Marland Kitchen imipramine (TOFRANIL) 25 MG tablet Take 1 tablet (25 mg total) by mouth at bedtime. 90 tablet 3  . lamoTRIgine (LAMICTAL) 150 MG tablet One po bid 60 tablet  11  . loratadine (CLARITIN) 10 MG tablet Take 10 mg by mouth every morning.     . Meclizine HCl 25 MG CHEW Chew 2 tablets by mouth as needed (dizziness).     . Multiple Vitamins-Minerals (MULTIVITAMIN PO) Take by mouth. Brand name - Mature Multivitamin from [employer]    . pantoprazole (PROTONIX) 40 MG tablet Take 1 tablet (40 mg total) by mouth every morning. (Patient taking differently: Take 40 mg by mouth 2 (two) times daily. ) 30 tablet 3  . rOPINIRole (REQUIP) 0.25 MG tablet TAKE 1 TO 2 TABLETS BY MOUTH AT NIGHT 60 tablet 9  . Vitamin D, Ergocalciferol, (DRISDOL) 50000 UNITS CAPS capsule Take 1 capsule (50,000 Units total) by mouth every 7 (seven) days. When finished, take an otc daily vitamin d supplement of 5,000iu. (Patient taking differently: Take 50,000 Units by mouth. Take twice weekly.  WHenfinished, take an otc daily vitamin d supplement of 5,000iu.) 12 capsule 0  . amphetamine-dextroamphetamine (ADDERALL XR) 15 MG 24 hr capsule Take 1 capsule by mouth every morning. 30 capsule 0  . fluticasone (FLONASE) 50 MCG/ACT nasal spray Place 2 sprays into both nostrils every morning.     . dalfampridine 10 MG TB12 Take 1 tablet (10 mg total) by mouth 3 (three) times daily. Dose change (Patient not taking: Reported on 04/13/2017) 90 tablet 11   No facility-administered medications prior to visit.     PAST MEDICAL HISTORY: Past Medical History:  Diagnosis Date  . Arthritis    back  . Dysesthesia    bilateral feet  . Family history of adverse reaction to anesthesia    mother  -- ponv  . Fatigue   . Fecal incontinence   . Frequent falls    due to MS  . GERD (gastroesophageal reflux disease)   . Heart murmur   . History of colon polyps    2013;  2015  . 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 Migraines    right side   . Mitral valve prolapse    states no problem  . Multiple sclerosis [facility]) 1989   neurologist-  dr Felecia Shelling  . Neurogenic bladder   . Osteoporosis   . PONV (postoperative nausea and vomiting)   . Pulmonary nodule, right    pulmologist-  dr Milinda Hirschfeld--  stable per ct 05-17-2016  . Short of breath on exertion   . Sjogren's disease ([place])    dryness of eyes, mouth  . Spastic gait   . Transverse myelitis ([place])    T4  . Wears bilateral ankle braces    AFO braces for stability    PAST SURGICAL HISTORY: Past Surgical History:  Procedure Laterality Date  . ANAL RECTAL MANOMETRY N/A 08/18/2016   Procedure: ANO RECTAL MANOMETRY;  Surgeon: Leighton Ruff, MD;  Location: [facility];  Service: Endoscopy;  Laterality: N/A;  . ANTERIOR CERVICAL DECOMP/DISCECTOMY FUSION  2004;   2003   C4 -- C5 (2004)/   C3 -- C4 (2003)  . APPENDECTOMY  06/22/2011   laparoscopic  . BILATERAL SALPINGOOPHORECTOMY  1982  . BLADDER SUSPENSION  1994   sling  . BREAST ENHANCEMENT SURGERY Bilateral   . BREAST IMPLANT REMOVAL Bilateral 12/03/2008  . BUNIONECTOMY    . CATARACT EXTRACTION W/ INTRAOCULAR LENS  IMPLANT, BILATERAL  2015  . ELBOW SURGERY Right   . HEMORROIDECTOMY  08/05/2010;  2013  . INSERTION SACRAL  NERVE STIMULATOR TEST WIRE  09-28-2016   dr Marcello Moores  . INTERCOSTAL NERVE BLOCK  2015   occipital  . RECTAL ULTRASOUND N/A 08/18/2016   Procedure: RECTAL ULTRASOUND;  Surgeon: Leighton Ruff, MD;  Location: [facility];  Service: Endoscopy;  Laterality: N/A;  . SHOULDER ARTHROSCOPY Left   . SHOULDER ARTHROSCOPY WITH DISTAL CLAVICLE RESECTION Right 09/21/2007   w/  Acrominoplasty,  Debridement  Rotator Cuff,  CA ligament release  . TONSILLECTOMY  age 23  . TRIGGER FINGER RELEASE Left 01/16/2015   Procedure: LEFT THUMB TRIGGER RELEASE ;  Surgeon: Leanora Cover, MD;  Location: [place];  Service: Orthopedics;  Laterality: Left;  Marland Kitchen VAGINAL HYSTERECTOMY  1981    FAMILY HISTORY: Family History  Problem Relation Age of Onset  . Liver disease Father   . Cirrhosis Father   . Stroke Mother   . Cancer Mother     oropharyngeal  . Heart attack Mother   . Anesthesia problems Mother     post-op nausea  . Cancer Brother     twin; tonsillar?  . Cancer Sister 68    breast ca, lung ca  . Cancer Other     Nephew; appendiceal carcnoid, melanoma  . Cancer Paternal Grandmother     cervical    SOCIAL HISTORY:  Social History   Social History  . Marital status: Married    Spouse name: Gershon Mussel  . Number of children: 2  . Years of education: college   Occupational History  . Retired    Social History Main Topics  . Smoking status: Passive Smoke Exposure - Never Smoker  . Smokeless tobacco: Never Used     Comment: parents, husband, & multiple family members  . Alcohol use No  . Drug use: No  . Sexual activity: Not on file   Other Topics Concern  . Not on file   Social History Narrative   Pt lives at home with her spouse of 73 years.   Caffeine Use- Drinks caffeine occasionally.      [facility]:   From [place]. Previously did administrative work. She currently has a cat & dog. No bird exposure. No mold or hot tub exposure. No carpet or draperies.            PHYSICAL EXAM  Vitals:   04/13/17 1510  BP: 139/75  Pulse: (!) 106  Resp: 16  Weight: 134 lb (60.8 kg)  Height: 5\' 10"  (1.778 m)    Body mass index is 19.23 kg/m.   General: The patient is well-developed and well-nourished and in no acute distress  Neck:    There is tenderness over the right splenius capitis last occipital nerve and the right trapezius muscles    The neck has good ROM   Mildly tender over left shoulder with reduced ROM  Neurologic Exam  Mental status: The patient is alert and oriented at the time of the examination. Reduced attention, better STM  WORLD-DLROW, [id number]   STM 3/3 without prompts  Speech is normal.  Cranial nerves: Extraocular movements are full.    Colors are desaturated OS  There is good facial sensation to soft touch bilaterally.Facial strength is normal.  Trapezius and sternocleidomastoid strength is normal. No dysarthria is noted.    Motor:  Very mild essential tremor noted with writing.   Muscle bulk and tone are normal. Strength is  4+/ 5 in the left and 5/5 in the right arm.   Leg strength is 4/5 and ankle dorsiflexion  4-/5 EHL, slightly worse on left  Sensory: Sensory testing shows symmetric arm sensation to touch and vibration but reduced touch sensation below T5 and decreased vibration in left leg.     Coordination: Cerebellar testing shows mildly reduced left RAM and left finger-nose-finger.  Gait and station: Station is stable eyes open.  Gait is spastic.  She has a foot drop bilaterally.    She can not tandem.  Romberg positive  Reflexes: Deep tendon reflexes are symmetric and normal in arms, 3+ at knees.       DIAGNOSTIC DATA (LABS, IMAGING, TESTING) - I reviewed patient records, labs, notes, testing and imaging myself where available.     ASSESSMENT AND PLAN  MULTIPLE SCLEROSIS  Transverse myelitis (HCC)  Attention deficit disorder, unspecified hyperactivity presence  Occipital neuralgia of right side  Other fatigue  Neck pain    1.   Right splenius capitis, C6C7 paraspinal and trapezius trigger point injection with 80 mg Depo-Medrol in [place] using sterile technique. She tolerated the procedure well and there were no complications. 2.   Renew Adderall XR 15 mg by mouth every morning to help with her attentional and focus difficulties.   3.  She will wear the bilateral AFOs daily as needed 4.  RTC 6  months, sooner if problems   Richard A. Felecia Shelling, MD, PhD 3/00/5110, 2:11 PM Certified in Neurology, Clinical Neurophysiology, Sleep Medicine, Pain Medicine and Neuroimaging  [facility] [address], [place], [place] [address] [phone]

## 2017-05-02 DIAGNOSIS — E559 Vitamin D deficiency, unspecified: Secondary | ICD-10-CM | POA: Diagnosis not present

## 2017-05-02 DIAGNOSIS — Z0000 Encounter for general adult medical examination without abnormal findings: Secondary | ICD-10-CM | POA: Diagnosis not present

## 2017-05-02 DIAGNOSIS — K529 Noninfective gastroenteritis and colitis, unspecified: Secondary | ICD-10-CM | POA: Diagnosis not present

## 2017-05-09 DIAGNOSIS — M25512 Pain in left shoulder: Secondary | ICD-10-CM | POA: Diagnosis not present

## 2017-05-09 DIAGNOSIS — Z1389 Encounter for screening for other disorder: Secondary | ICD-10-CM | POA: Diagnosis not present

## 2017-05-09 DIAGNOSIS — M3500 Sicca syndrome, unspecified: Secondary | ICD-10-CM | POA: Diagnosis not present

## 2017-05-09 DIAGNOSIS — G608 Other hereditary and idiopathic neuropathies: Secondary | ICD-10-CM | POA: Diagnosis not present

## 2017-05-09 DIAGNOSIS — G35 Multiple sclerosis: Secondary | ICD-10-CM | POA: Diagnosis not present

## 2017-05-09 DIAGNOSIS — M25562 Pain in left knee: Secondary | ICD-10-CM | POA: Diagnosis not present

## 2017-05-09 DIAGNOSIS — Z0000 Encounter for general adult medical examination without abnormal findings: Secondary | ICD-10-CM | POA: Diagnosis not present

## 2017-05-09 DIAGNOSIS — Z681 Body mass index (BMI) 19 or less, adult: Secondary | ICD-10-CM | POA: Diagnosis not present

## 2017-05-09 DIAGNOSIS — R42 Dizziness and giddiness: Secondary | ICD-10-CM | POA: Diagnosis not present

## 2017-05-09 DIAGNOSIS — M25552 Pain in left hip: Secondary | ICD-10-CM | POA: Diagnosis not present

## 2017-05-09 DIAGNOSIS — M810 Age-related osteoporosis without current pathological fracture: Secondary | ICD-10-CM | POA: Diagnosis not present

## 2017-05-09 DIAGNOSIS — G373 Acute transverse myelitis in demyelinating disease of central nervous system: Secondary | ICD-10-CM | POA: Diagnosis not present

## 2017-05-10 DIAGNOSIS — Z1212 Encounter for screening for malignant neoplasm of rectum: Secondary | ICD-10-CM | POA: Diagnosis not present

## 2017-05-16 ENCOUNTER — Telehealth: Payer: Self-pay | Admitting: Neurology

## 2017-05-16 MED ORDER — AMPHETAMINE-DEXTROAMPHET ER 15 MG PO CP24: 15.0000 mg | ORAL_CAPSULE | ORAL | 0 refills | Status: DC

## 2017-05-16 NOTE — Telephone Encounter (Signed)
Pt request refill for amphetamine-dextroamphetamine (ADDERALL XR) 15 MG 24 hr capsule °

## 2017-05-16 NOTE — Telephone Encounter (Signed)
Rx. awaiting RAS sig/fim 

## 2017-05-17 NOTE — Telephone Encounter (Signed)
Rx. up front [facility]/fim 

## 2017-06-14 ENCOUNTER — Telehealth: Payer: Self-pay | Admitting: Neurology

## 2017-06-14 MED ORDER — AMPHETAMINE-DEXTROAMPHET ER 15 MG PO CP24: 15.0000 mg | ORAL_CAPSULE | ORAL | 0 refills | Status: DC

## 2017-06-14 NOTE — Telephone Encounter (Signed)
Rx. awaiting RAS sig/fim 

## 2017-06-14 NOTE — Addendum Note (Signed)
Addended by: France Ravens I on: 06/14/2017 11:09 AM   Modules accepted: Orders

## 2017-06-14 NOTE — Telephone Encounter (Signed)
Rx. up front [facility]/fim 

## 2017-06-14 NOTE — Telephone Encounter (Signed)
Pt request refill for amphetamine-dextroamphetamine (ADDERALL XR) 15 MG 24 hr capsule °

## 2017-06-16 ENCOUNTER — Telehealth: Payer: Self-pay | Admitting: *Deleted

## 2017-06-16 MED ORDER — 4-AMINOPYRIDINE POWD
5.0000 mg | Freq: Three times a day (TID) | 3 refills | Status: DC
Start: 2017-06-16 — End: 2018-07-03

## 2017-06-16 NOTE — Telephone Encounter (Signed)
Compounded 4-Ap rx. (5mg  po tid., #270 with 3 r/f) escribed to [place] per faxed request/fim

## 2017-06-30 DIAGNOSIS — S01112A Laceration without foreign body of left eyelid and periocular area, initial encounter: Secondary | ICD-10-CM | POA: Diagnosis not present

## 2017-07-12 ENCOUNTER — Telehealth: Payer: Self-pay | Admitting: Neurology

## 2017-07-12 MED ORDER — AMPHETAMINE-DEXTROAMPHET ER 15 MG PO CP24: 15.0000 mg | ORAL_CAPSULE | ORAL | 0 refills | Status: DC

## 2017-07-12 NOTE — Telephone Encounter (Signed)
Rx. awaiting RAS sig/fim 

## 2017-07-12 NOTE — Telephone Encounter (Signed)
Patient requesting refill of amphetamine-dextroamphetamine (ADDERALL XR) 15 MG 24 hr capsule. ° ° °

## 2017-07-12 NOTE — Telephone Encounter (Signed)
Rx. up front [facility]/fim 

## 2017-07-12 NOTE — Addendum Note (Signed)
Addended by: France Ravens I on: 07/12/2017 09:11 AM   Modules accepted: Orders

## 2017-07-18 DIAGNOSIS — H52203 Unspecified astigmatism, bilateral: Secondary | ICD-10-CM | POA: Diagnosis not present

## 2017-07-18 DIAGNOSIS — H26493 Other secondary cataract, bilateral: Secondary | ICD-10-CM | POA: Diagnosis not present

## 2017-07-18 DIAGNOSIS — Z961 Presence of intraocular lens: Secondary | ICD-10-CM | POA: Diagnosis not present

## 2017-07-27 DIAGNOSIS — Z0189 Encounter for other specified special examinations: Secondary | ICD-10-CM | POA: Diagnosis not present

## 2017-07-27 DIAGNOSIS — R0602 Shortness of breath: Secondary | ICD-10-CM | POA: Diagnosis not present

## 2017-07-27 DIAGNOSIS — G35 Multiple sclerosis: Secondary | ICD-10-CM | POA: Diagnosis not present

## 2017-07-27 DIAGNOSIS — R002 Palpitations: Secondary | ICD-10-CM | POA: Diagnosis not present

## 2017-07-29 DIAGNOSIS — R002 Palpitations: Secondary | ICD-10-CM | POA: Diagnosis not present

## 2017-08-03 ENCOUNTER — Other Ambulatory Visit: Payer: Self-pay | Admitting: Neurology

## 2017-08-15 ENCOUNTER — Telehealth: Payer: Self-pay | Admitting: Neurology

## 2017-08-15 MED ORDER — AMPHETAMINE-DEXTROAMPHET ER 15 MG PO CP24: 15.0000 mg | ORAL_CAPSULE | ORAL | 0 refills | Status: DC

## 2017-08-15 NOTE — Addendum Note (Signed)
Addended by: France Ravens I on: 08/15/2017 04:17 PM   Modules accepted: Orders

## 2017-08-15 NOTE — Telephone Encounter (Signed)
Rx. up front [facility]/fim 

## 2017-08-15 NOTE — Telephone Encounter (Signed)
Patient called office requesting refill for amphetamine-dextroamphetamine (ADDERALL XR) 15 MG 24 hr capsule °

## 2017-08-15 NOTE — Telephone Encounter (Signed)
Rx. awaiting RAS sig/fim 

## 2017-08-18 ENCOUNTER — Telehealth: Payer: Self-pay | Admitting: *Deleted

## 2017-08-18 DIAGNOSIS — G4719 Other hypersomnia: Secondary | ICD-10-CM

## 2017-08-18 DIAGNOSIS — R002 Palpitations: Secondary | ICD-10-CM

## 2017-08-18 NOTE — Telephone Encounter (Signed)
Per RAS, cardiologist rec. sleep study due to palpitations, EDS. Pt. is agreeable.  S/N study ordered/fim

## 2017-09-02 ENCOUNTER — Other Ambulatory Visit: Payer: Self-pay | Admitting: Gastroenterology

## 2017-09-02 DIAGNOSIS — R109 Unspecified abdominal pain: Secondary | ICD-10-CM

## 2017-09-02 DIAGNOSIS — Z8500 Personal history of malignant neoplasm of unspecified digestive organ: Secondary | ICD-10-CM | POA: Diagnosis not present

## 2017-09-07 ENCOUNTER — Ambulatory Visit
Admission: RE | Admit: 2017-09-07 | Discharge: 2017-09-07 | Disposition: A | Discharge status: Home / Self Care | Payer: PPO | Source: Ambulatory Visit | Attending: Gastroenterology | Admitting: Gastroenterology

## 2017-09-07 ENCOUNTER — Other Ambulatory Visit: Payer: Self-pay | Admitting: Gastroenterology

## 2017-09-07 DIAGNOSIS — R1030 Lower abdominal pain, unspecified: Secondary | ICD-10-CM | POA: Diagnosis not present

## 2017-09-07 DIAGNOSIS — R109 Unspecified abdominal pain: Secondary | ICD-10-CM

## 2017-09-07 MED ORDER — IOPAMIDOL (ISOVUE-300) INJECTION 61%
100.0000 mL | Freq: Once | INTRAVENOUS | Status: AC | PRN
  Administered 2017-09-07: 100 mL via INTRAVENOUS

## 2017-09-14 ENCOUNTER — Telehealth: Payer: Self-pay | Admitting: Neurology

## 2017-09-14 MED ORDER — AMPHETAMINE-DEXTROAMPHET ER 15 MG PO CP24
15.0000 mg | ORAL_CAPSULE | ORAL | 0 refills | Status: DC
Start: 2017-09-14 — End: 2017-10-19

## 2017-09-14 NOTE — Telephone Encounter (Signed)
Patient requesting refill of amphetamine-dextroamphetamine (ADDERALL XR) 15 MG 24 hr capsule.

## 2017-09-14 NOTE — Telephone Encounter (Signed)
Adderall rx. up front [facility]/fim 

## 2017-09-14 NOTE — Telephone Encounter (Signed)
Rx. awaiting RAS sig/fim 

## 2017-09-14 NOTE — Addendum Note (Signed)
Addended by: France Ravens I on: 09/14/2017 01:16 PM   Modules accepted: Orders

## 2017-09-21 NOTE — Telephone Encounter (Signed)
Spoke with pt. who sts. she received ins. approval for sleep study. Would like to schedule an appt.  Will forward this message to the sleep lab with request for them to call pt./fim

## 2017-09-21 NOTE — Telephone Encounter (Signed)
Patient is calling regarding scheduling sleep study. I was going to connect her to the sleep lab but patient wanted to leave a message with the nurse.

## 2017-10-06 ENCOUNTER — Other Ambulatory Visit: Payer: Self-pay | Admitting: Neurology

## 2017-10-08 DIAGNOSIS — Z23 Encounter for immunization: Secondary | ICD-10-CM | POA: Diagnosis not present

## 2017-10-11 ENCOUNTER — Ambulatory Visit (INDEPENDENT_AMBULATORY_CARE_PROVIDER_SITE_OTHER): Payer: PPO | Admitting: Neurology

## 2017-10-11 DIAGNOSIS — R002 Palpitations: Secondary | ICD-10-CM

## 2017-10-11 DIAGNOSIS — G4719 Other hypersomnia: Secondary | ICD-10-CM

## 2017-10-11 DIAGNOSIS — G4710 Hypersomnia, unspecified: Secondary | ICD-10-CM | POA: Diagnosis not present

## 2017-10-13 ENCOUNTER — Ambulatory Visit: Payer: PPO | Admitting: Neurology

## 2017-10-17 DIAGNOSIS — G4719 Other hypersomnia: Secondary | ICD-10-CM | POA: Insufficient documentation

## 2017-10-17 NOTE — Progress Notes (Signed)
PATIENTS NAME:  [name], [name] DOB:      06/18/1951      MR#:    [id number]     DATE OF RECORDING: 10/11/2017 REFERRING M.D.:  Crist Infante, MD Study Performed:   Baseline Polysomnogram HISTORY:  MS, ADHD, fatigue, neck pain  The patients weight 134 pounds with a height of 70 (inches), resulting in a BMI of 19.3 kg/m2.  The patients neck circumference measured 12 inches.  CURRENT MEDICATIONS: Tylenol, Biotin, Prolia, Advil, Tofranil, Lamictal, Claritin, Protonix, Requip, Drisdol, Adderall, Flonase, Dalfampridine   PROCEDURE:  This is a multichannel digital polysomnogram utilizing the Somnostar 11.2 system.  Electrodes and sensors were applied and monitored per AASM Specifications.   EEG, EOG, Chin and Limb EMG, were sampled at 200 Hz.  ECG, Snore and Nasal Pressure, Thermal Airflow, Respiratory Effort, CPAP Flow and Pressure, Oximetry was sampled at 50 Hz. Digital video and audio were recorded.      BASELINE STUDY  Lights Out was at 22:22 and Lights On at 05:03.  Total recording time (TRT) was 401 minutes, with a total sleep time (TST) of  269 minutes.   The patients sleep latency was 76.5 minutes.  REM latency was 62.5 minutes.  The sleep efficiency was 67.1 %.     SLEEP ARCHITECTURE: WASO (Wake after sleep onset) was 58.5 minutes.  There were 23.5 minutes in Stage N1, 101.5 minutes Stage N2, 112.5 minutes Stage N3 and 31.5 minutes in Stage REM.  The percentage of Stage N1 was 8.7%, Stage N2 was 37.7%, Stage N3 was 41.8% and Stage R (REM sleep) was 11.7%.   The arousals were noted as: 19 were spontaneous, 7 were associated with PLMs, 4 were associated with respiratory events.  Audio and video analysis did not show any abnormal or unusual movements, behaviors, phonations or vocalizations.  She had nocturia x 1.   Snoring was noted.   EKG showed normal sinus rhythm (NSR).  RESPIRATORY ANALYSIS:  There were a total of 6 respiratory events:  3 obstructive apneas, 0 central apneas  and 0 mixed apneas with a total of 3 apneas and an apnea index (AI) of .7 /hour. There were 3 hypopneas with a hypopnea index of .7 /hour. The patient also had 0 respiratory event related arousals (RERAs).      The total APNEA/HYPOPNEA INDEX (AHI) was 1.3/hour and the total RESPIRATORY DISTURBANCE INDEX was 1.3 /hour.  2 events occurred in REM sleep and 2 events in NREM. The REM AHI was 3.8 /hour, versus a non-REM AHI of 1.. The patient spent 211.5 minutes of total sleep time in the supine position and 58 minutes in non-supine.. The supine AHI was 1.8 versus a non-supine AHI of 0.0.  OXYGEN SATURATION & C02:  The Wake baseline 02 saturation was 98%, with the lowest being 81%. Time spent below 89% saturation equaled 2 minutes.  PERIODIC LIMB MOVEMENTS:   The patient had a total of 15 Periodic Limb Movements.  The Periodic Limb Movement (PLM) index was 3.3 and the PLM Arousal index was 1.6/hour.  IMPRESSION:  Reduced sleep efficiency of 67% due to delayed sleep latency and increased WASO (wake after sleep onset) There was no significant OSA or PLMS  RECOMMENDATIONS:  If reduced sleep latency and/or frequent awakenings are typical, consider a sleep promoting agent Follow-up with Dr. Felecia Shelling   I certify that I have reviewed the entire raw data recording prior to the issuance of this report in accordance with the Standards of Accreditation  of the American Academy of Sleep Medicine (AASM)      Juanell Fairly, PhD, Darlin Priestly, American Board of Psychiatry and Neurology  [place]               Demographics and Medical History           Name: Mileigh, Tilley. Age: 66 BMI: 19.3 Interp Physician: Arlice Colt, MD  DOB: 24-Aug-1951 Ht-IN: 70 CM: 178 Referred By: Crist Infante, MD  Pt. Tag:  Wt-LB: 134 KG: 61 Tested By: Bo Mcclintock, RPSGT  Pt. #: [id number] Sex: Female Scored By: Bo Mcclintock, RPSGT  Bed Tag: [id number] Race: Caucasian  Occupation: ---  Indication for PS: ---   Sleep Summary    Sleep Time Statistics Minutes Hours    Time in Bed 401    6.7    Total Sleep Time 269    4.5    Total Sleep Time NREM 237.5    4.0    Total Sleep Time REM 31.5    0.5    Sleep Onset 73    1.2    Wake After Sleep Onset 58.5    1.0    Wake After Sleep Period 0.5    0.0    Latency Persistent Sleep 76.5    1.3    Sleep Efficiency 67.1 Percent    Lights out 22:22     Lights on 05:03    Sleep Disruption Events Count Index    Arousals 48 10.7    Awakenings 0 0    Arousals + Awakenings 48 10.7    REM Awakenings 1 0.2     Sleep Stage Statistics Wake N1 N2 N3 REM    Percent Stage to SPT 17.9  7.2  31.0  34.4  9.6  Percent   Sleep Period Time in Stage 58.5 23.5 101.5 112.5 31.5 Minutes   Latency to Stage  73 75.5 13 62.5 Minutes   Percent Stage to TST  8.7 37.7 41.8 11.7 Percent   EKG Summary          EKG Statistics         Heart Rate, Wake 92 BPM  TST Epochs in HR Interval 0 < 29   Heart Rate, Steady Sleep Avg 83 BPM   0 30-59   PAC Events 0 Count   72 60-79   PVC Events 0 Count   462 80-99   Bradycardia 0 Count   4 100-119   Tachycardia 0 Count   0 120-139        0 140-159    NREM REM   0 > 160   Shortest R-R .5 .5       Longest R-R 0.8 .8        Respiration Summary  Event Statistics Total  With Arousal  With Awakening    Count Index  Count Index  Count Index   Apneas, Total 3 .7  3 0.7   0 0.0    Hypopneas, Total 3 .7  1 0.2   0 0.0    Apnea + Hypopnea Index 6 1.3   4 0.9   0 0.0    Apneas, Supine 3 .9     Apneas, Non Supine 0 0     Hypopneas, Supine 3 .9     Hypopneas, Non Supine 0 0     % Sleep Apnea .5 Percent     % Sleep Hypopnea .5 Percent    Oximetry Statistics  SpO2, Mean Wake 98 Percent     SpO2, Minimum 81 Percent     SpO2, Max 96 Percent     SpO2, Mean 94 Percent            Desaturation Index, REM 3.8  Index     Desaturation Index, NREM 1.5  Index     Desaturation Index,  Total 1.8  Index             SpO2 Intervals > 89% 80-89% 70-79% 60-69% 50-59% 40-49% 30-39% < 30%  269 Percent Sleep Time 98.1 1.9 0 0 0 0 0 0  Body Position Statistics   Back Side L Side R Side Prone    Total Sleep Time   211.5 57.5 0 57.5 0 Minutes   Percent Time to TST   78.6  21.4  0.0  21.4  0.0  Percent   Number of Events   6 0.0 0 0 0 Count   Number of Apneas   3 0 0 0 0 Count   Number of Hypopneas   3 0 0 0 0 Count   Apnea Index   0.9  0.0  0.0  0.0  0.0  Index   Hypopnea Index   0.9  0.0  0.0  0.0  0.0  Index   Apnea + Hypopnea Index   1.7  0.0  0.0  0.0  0.0  Index  Respiration Events    Non REM, Pre Rx Statistics Non Supine  Supine    Central Mixed Obstr  Central Mixed Obstr   Apneas 0 0 0  0 0 3 Count  Apneas, Minimum SpO2 0 0 0  0 0 82 Percent     Hypopneas 0 0 0  0 0 1 Count  Hypopneas, Minimum SpO2 0 0 0  0 0 89 Percent     Apnea + Hypopneas Index 0.0 0.0 0.0  0.0 0.0 1.3 Index    REM, Pre Rx Statistics Non Supine  Supine    Central Mixed Obstr  Central Mixed Obstr   Apneas 0 0 0  0 0 0 Count  Apneas, Minimum SpO2 0 0 0  0 0 0 Percent     Hypopneas 0 0 0  0 0 2 Count  Hypopneas, Minimum SpO2 0 0 0  0 0 88 Percent     Apnea + Hypopnea Index 0.0 0.0 0.0  0.0 0.0 5.2 Index  Leg Movement Summary    PLM Non REM (Incl. Wake) REM Total    No Arousal Arousal Wake No Arousal Arousal Wake No Arousal Arousal Wake Total   Isolated 12 17 0 3 1 0 15 18 0 33    PLMS 7 7 0 1 0 0 8 7 0 15    Total 19 24 0 4 1 0 23 25 0 48   PLM Statistics PLMS Total     Count Index Count Index    PLM 15 3.3 48 10.7     PLM with Arousal 7 1.6 25 5.6     PLM, with Wake 0 0 0 0.0     PLM, Arousal + Wake 7 1.6 25 5.6     PLM, No Arousal 8 1.8  23 5.1     PLM, Non REM 14 3.5  43 10.9     PLM, REM 1 1.9  5 9.5     Technician Comments:  Pt scored per 3% hypopnea rule.  Rare respiratory events noted, mild in REM/supine sleep.  Mild PLMs  noted.  NSR observed in  EKG.  Soft snoring heard intermittently.  Pt slept in supine and lateral positions.  Pt observed in sleep stages N1, N2, N3, REM.  Nocturia x1.

## 2017-10-18 ENCOUNTER — Telehealth: Payer: Self-pay | Admitting: Neurology

## 2017-10-18 NOTE — Telephone Encounter (Signed)
Pt calling for her written prescription for her amphetamine-dextroamphetamine (ADDERALL XR) 15 MG 24 hr capsule

## 2017-10-19 ENCOUNTER — Encounter: Payer: Self-pay | Admitting: Neurology

## 2017-10-19 ENCOUNTER — Ambulatory Visit (INDEPENDENT_AMBULATORY_CARE_PROVIDER_SITE_OTHER): Payer: PPO | Admitting: Neurology

## 2017-10-19 VITALS — BP 132/76 | HR 92 | Resp 14 | Ht 70.0 in | Wt 140.0 lb

## 2017-10-19 DIAGNOSIS — G373 Acute transverse myelitis in demyelinating disease of central nervous system: Secondary | ICD-10-CM

## 2017-10-19 DIAGNOSIS — F988 Other specified behavioral and emotional disorders with onset usually occurring in childhood and adolescence: Secondary | ICD-10-CM | POA: Diagnosis not present

## 2017-10-19 DIAGNOSIS — R350 Frequency of micturition: Secondary | ICD-10-CM | POA: Diagnosis not present

## 2017-10-19 DIAGNOSIS — R5383 Other fatigue: Secondary | ICD-10-CM

## 2017-10-19 DIAGNOSIS — R261 Paralytic gait: Secondary | ICD-10-CM | POA: Diagnosis not present

## 2017-10-19 DIAGNOSIS — M542 Cervicalgia: Secondary | ICD-10-CM

## 2017-10-19 DIAGNOSIS — G35 Multiple sclerosis: Secondary | ICD-10-CM | POA: Diagnosis not present

## 2017-10-19 DIAGNOSIS — R208 Other disturbances of skin sensation: Secondary | ICD-10-CM

## 2017-10-19 MED ORDER — IMIPRAMINE HCL 25 MG PO TABS: ORAL_TABLET | ORAL | 3 refills | Status: DC

## 2017-10-19 MED ORDER — AMPHETAMINE-DEXTROAMPHET ER 15 MG PO CP24
15.0000 mg | ORAL_CAPSULE | ORAL | 0 refills | Status: DC
Start: 2017-10-19 — End: 2017-11-14

## 2017-10-19 NOTE — Progress Notes (Signed)
[facility]  PATIENT: [name] DOB: February 23, 1951  REFERRING CLINICIAN: Crist Infante HISTORY FROM: patient REASON FOR VISIT: transverse myelitis, MS?   HISTORICAL  CHIEF COMPLAINT:  Chief Complaint  Patient presents with  . Multiple Sclerosis    She remains off of dmt.  C/O intermittent numbness above upper lip for 1-2 wks.  Shis is wearing bilat AFO's but feels they need to be adjusted as she has a hard time getting out of a seated position.  Appt. with [employer] made for 10/15/17 10am.  She requests tpi for h/a, neck pain today, if appropriate/fim    HISTORY OF PRESENT ILLNESS:  [name] is a 66 yo woman with Multiple Sclerosis.     Update 10/19/2017:     She feels her arms are weak and she sometimes slams her arm around because there is less control.    She sometimes loses her balance and has had one fall since her last visit.     She has bilateral AFO braces which help.   She uses a cane.    She has trouble standing up from a seated position and climbing stairs.   She takes compounded 4-AP 5 mg po tid.    She has dysesthesias in her feet > hands.  Lamotrigine and imipramine help the pain some.   Her bladder and bowel improved by the sacral nerve stimulator.     Mood is fine.   Adderall has helped her focus and attention and helped her sleepiness.   STM is still a problem.   Shje has some verbal fluency issues.   She had a sleep study due to her excessive daytime sleepiness and snoring. It did not show any significant OSA (AHI = 1.3). There was no significant restless leg syndrome she did have delayed sleep onset and increase wake after sleep onset  She has occipital pain as before causing daily headache.   In the past a splenius capitus TPI or occipital nerve block was very helpful.    Pain is right >> left.   She has some neck pain lower as well       From 04/13/2017:    Transverse myelitis/gait/strength/sensation/bladder:   She reports no change  in her mild leg weakness.   She has a history of a T4 transverse myelitis.  She walks better with the new AFO braces. The brace hurts at times.   She also notes it is harder to climb stairs when she wears them.       She is on  Ampyra 5 mg po tid (compounded) and tolerates it well and notes improvement.   Lamotrigine with imipramine  is helping her burning dysesthesias in both feet and hands.       Lyrica and gabapentin only helped a bit.    Burning is worse at night.      Vision:   Earlier in 2017, she had a possible optic neuritis exacerbation.  She feels she is back to baseline.  Color vision is more symmetric.    She had macular edema on Gilenya that improved after stopping it.      Bladder:  She has urinary frequency and incontinence since the early 1990s but this is much better since a stim surgery.      Memory:   Memory and focus improved after starting Adderall. She tolerates it well. It is not negatively affecting her sleep.   Sleep:    She is sleeping better with less nocturia since  starting imipramine.  .  Fatigue/mood:  She feels fatigue is better on Adderall.    She denies depression or anxiety.  She sometimes has difficulty coming up with the right words. She has had difficulty multitasking and completing tasks.        Right sided headaches and neck pain:   The right neck and occiput improved after the occipital nerve block/splenius capitus muscle injection last visit.  She did well for about 2 months but then the headache returned. She would like to have another shot if possible.            Tremor:   She notes a tremor in her hands, worse when she writes.   She has not noted any association with stress or mood.    Her aunt has tremor in her jaw and hands.     TM/MS History:  In 1989, she had severe numbness in one side of her body.  She had clumsiness and poor gait.  MRI was reportedly normal.     She saw Dr. Erling Cruz who did an LP that showed oligoclonal bands.   She received IV steroids  and was told she had MS.   In 1993, she woke up numb from the waist down in both legs.  This numbness was more intense than the prior episodes and gait was very poor.    She was admitted x 28 days, receiving many days of IV steroids followed by Rehab.     An MRI of the brain was normal but the MRI of the thoracic spine showed a plaque at T4.     She was started on Betaseron and did well in general.  She had some fluctuating symptom but nothing resembling any of the other 3 episodes.   In November 2014 she had additional imaging studies showing a normal MRI of the brain, an abnormal MRI of the thoracic spine with a focus at T4, and a normal cervical spinal cord. She did have evidence of prior fusion surgery in the neck.  MRI of the cervical spine in September 2016 showed stable postoperative ACDF at C4-C5. The spinal cord appeared normal.   There was no myelopathy in the cervical spine. She also underwent a lumbar puncture in November 2014. It was normal and did not show oligoclonal bands or increased IgG index.    She has been treated with Betaseron, Avonex, Tysabri and Gilenya for presumptive MS in the past.   She stopped Tysabri as was JCV Ab positive and stopped Gilenya as she had macula edema.   She tried [place] but stopped due to insurance.  No DMT since 2014.       REVIEW OF SYSTEMS:  Constitutional: No fevers, chills, sweats, or change in .  She reports fatigue Eyes: as above Ear, nose and throat: No hearing loss, ear pain, nasal congestion, sore throat Cardiovascular: No chest pain, palpitations Respiratory:  No shortness of breath at rest or with exertion.   No wheezes GastrointestinaI: No nausea, vomiting, diarrhea, abdominal pain.  Reports fecal incontinence.   Reports dysphagia Genitourinary:  see above Musculoskeletal:  No neck pain, back pain.   Hasmuscle cramps Integumentary: No rash, pruritus, skin lesions Neurological: as above Psychiatric: No depression at this time.  No  anxiety Endocrine: No palpitations, diaphoresis, change in appetite, change in weigh or increased thirst Hematologic/Lymphatic:  No anemia, purpura, petechiae. Allergic/Immunologic: No itchy/runny eyes, nasal congestion, recent allergic reactions, rashes  ALLERGIES: Allergies  Allergen Reactions  . Other Shortness  Of Breath    PURPLE LETTUCE  . Betaseron [Interferon Beta-1b] Other (See Comments)    HARD NODULAR AREAS AT INJECTION SITE  . Codeine Nausea And Vomiting  . Gilenya [Fingolimod Hydrochloride] Other (See Comments)    MACULAR EDEMA  . Morphine And Related Other (See Comments)    IV ROUTE - RED STREAKS OF VEIN  . Sumatriptan Nausea Only    INJECTABLE ONLY - RAPID HEART RATE, FLUSHING  . Tysabri [Natalizumab] Other (See Comments)    DEVELOPED JCV ANTIBODY  . Latex Rash    HOME MEDICATIONS: Outpatient Medications Prior to Visit  Medication Sig Dispense Refill  . acetaminophen (TYLENOL) 500 MG tablet Take 500 mg by mouth every 6 (six) hours as needed.    Marland Kitchen amphetamine-dextroamphetamine (ADDERALL XR) 15 MG 24 hr capsule Take 1 capsule by mouth every morning. 30 capsule 0  . Biotin 10000 MCG TABS Take 1 tablet by mouth daily.    . calcium carbonate (OS-CAL) 600 MG TABS tablet Take 1,200 mg by mouth.     . Dalfampridine (4-AMINOPYRIDINE) POWD Take 5 mg by mouth 3 (three) times daily. Pharmacy dispense 270 capsules with 3 refills. Thank you 270 Bottle 3  . denosumab (PROLIA) 60 MG/ML SOLN injection Inject 60 mg into the skin every 6 (six) months. Administer in upper arm, thigh, or abdomen    . Ibuprofen (ADVIL) 200 MG CAPS Take by mouth as needed.    . lamoTRIgine (LAMICTAL) 150 MG tablet TAKE 1 TABLET BY MOUTH TWICE A DAY 60 tablet 11  . loratadine (CLARITIN) 10 MG tablet Take 10 mg by mouth every morning.     . Meclizine HCl 25 MG CHEW Chew 2 tablets by mouth as needed (dizziness).     . Multiple Vitamins-Minerals (MULTIVITAMIN PO) Take by mouth. Brand name - Mature  Multivitamin from [employer]    . pantoprazole (PROTONIX) 40 MG tablet Take 1 tablet (40 mg total) by mouth every morning. (Patient taking differently: Take 40 mg by mouth 2 (two) times daily. ) 30 tablet 3  . rOPINIRole (REQUIP) 0.25 MG tablet TAKE 1 TO 2 TABLETS BY MOUTH AT NIGHT 60 tablet 9  . Vitamin D, Ergocalciferol, (DRISDOL) 50000 UNITS CAPS capsule Take 1 capsule (50,000 Units total) by mouth every 7 (seven) days. When finished, take an otc daily vitamin d supplement of 5,000iu. (Patient taking differently: Take 50,000 Units by mouth. Take twice weekly.  WHenfinished, take an otc daily vitamin d supplement of 5,000iu.) 12 capsule 0  . imipramine (TOFRANIL) 25 MG tablet Take 1 tablet (25 mg total) by mouth at bedtime. 90 tablet 3   No facility-administered medications prior to visit.     PAST MEDICAL HISTORY: Past Medical History:  Diagnosis Date  . Arthritis    back  . Dysesthesia    bilateral feet  . Family history of adverse reaction to anesthesia    mother -- ponv  . Fatigue   . Fecal incontinence   . Frequent falls    due to MS  . GERD (gastroesophageal reflux disease)   . Heart murmur   . History of colon polyps    2013;  2015  . 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 Migraines    right side   . Mitral valve prolapse    states no problem  . Multiple sclerosis [facility]) 1989   neurologist-  dr Felecia Shelling  .  Neurogenic bladder   . Osteoporosis   . PONV (postoperative nausea and vomiting)   . Pulmonary nodule, right    pulmologist-  dr Milinda Hirschfeld--  stable per ct 05-17-2016  . Short of breath on exertion   . Sjogren's disease ([place])    dryness of eyes, mouth  . Spastic gait   . Transverse myelitis ([place])    T4  . Wears bilateral ankle braces    AFO braces for stability    PAST SURGICAL HISTORY: Past Surgical History:  Procedure Laterality Date  . ANAL RECTAL MANOMETRY N/A 08/18/2016   Procedure:  ANO RECTAL MANOMETRY;  Surgeon: Leighton Ruff, MD;  Location: [facility];  Service: Endoscopy;  Laterality: N/A;  . ANTERIOR CERVICAL DECOMP/DISCECTOMY FUSION  2004;   2003   C4 -- C5 (2004)/   C3 -- C4 (2003)  . APPENDECTOMY  06/22/2011   laparoscopic  . BILATERAL SALPINGOOPHORECTOMY  1982  . BLADDER SUSPENSION  1994   sling  . BREAST ENHANCEMENT SURGERY Bilateral   . BREAST IMPLANT REMOVAL Bilateral 12/03/2008  . BUNIONECTOMY    . CATARACT EXTRACTION W/ INTRAOCULAR LENS  IMPLANT, BILATERAL  2015  . ELBOW SURGERY Right   . HEMORROIDECTOMY  08/05/2010;  2013  . INSERTION SACRAL NERVE STIMULATOR TEST WIRE  09-28-2016   dr Marcello Moores  . INTERCOSTAL NERVE BLOCK  2015   occipital  . RECTAL ULTRASOUND N/A 08/18/2016   Procedure: RECTAL ULTRASOUND;  Surgeon: Leighton Ruff, MD;  Location: [facility];  Service: Endoscopy;  Laterality: N/A;  . SHOULDER ARTHROSCOPY Left   . SHOULDER ARTHROSCOPY WITH DISTAL CLAVICLE RESECTION Right 09/21/2007   w/  Acrominoplasty,  Debridement Rotator Cuff,  CA ligament release  . TONSILLECTOMY  age 63  . TRIGGER FINGER RELEASE Left 01/16/2015   Procedure: LEFT THUMB TRIGGER RELEASE ;  Surgeon: Leanora Cover, MD;  Location: [facility];  Service: Orthopedics;  Laterality: Left;  Marland Kitchen VAGINAL HYSTERECTOMY  1981    FAMILY HISTORY: Family History  Problem Relation Age of Onset  . Liver disease Father   . Cirrhosis Father   . Stroke Mother   . Cancer Mother        oropharyngeal  . Heart attack Mother   . Anesthesia problems Mother        post-op nausea  . Cancer Brother        twin; tonsillar?  . Cancer Sister 18       breast ca, lung ca  . Cancer Other        Nephew; appendiceal carcnoid, melanoma  . Cancer Paternal Grandmother        cervical    SOCIAL HISTORY:  Social History   Social History  . Marital status: Married    Spouse name: Gershon Mussel  . Number of children: 2  . Years of education: college   Occupational History  . Retired     Social History Main Topics  . Smoking status: Passive Smoke Exposure - Never Smoker  . Smokeless tobacco: Never Used     Comment: parents, husband, & multiple family members  . Alcohol use No  . Drug use: No  . Sexual activity: Not on file   Other Topics Concern  . Not on file   Social History Narrative   Pt lives at home with her spouse of 70 years.   Caffeine Use- Drinks caffeine occasionally.      [facility]:   From [place]. Previously did administrative work. She currently has a cat &  dog. No bird exposure. No mold or hot tub exposure. No carpet or draperies.            PHYSICAL EXAM  Vitals:   10/19/17 0910  BP: 132/76  Pulse: 92  Resp: 14  Weight: 140 lb (63.5 kg)  Height: 5\' 10"  (1.778 m)    Body mass index is 20.09 kg/m.   General: The patient is well-developed and well-nourished and in no acute distress  Neck:    She has tenderness over the right splenius capitis muscle at the occiput. There is also tenderness over the lower cervical paraspinal muscles, right trapezius muscle and right rhomboid muscle. The neck has a good range of motion. There is mild tenderness over the left occiput.    Neurologic Exam  Mental status: The patient is alert and oriented at the time of the examination. Focus and attention seemed normal. Speech is normal.  Cranial nerves: Extraocular movements are full.    Colors are desaturated OS  There is good facial sensation to soft touch bilaterally.Facial strength is normal.  Trapezius and sternocleidomastoid strength is normal. No dysarthria is noted.    Motor:  Very mild essential tremor noted with writing.   Muscle bulk and tone are normal. Strength is  4+/ 5 in the left and 5/5 in the right arm.   Leg strength is 4/5 and ankle dorsiflexion 4-/5 EHL, slightly worse on left  Sensory: She has reduced sensation below T5 to touch and decreased vibration sensation in the legs..     Coordination: Cerebellar testing shows mildly  reduced left RAM and left finger-nose-finger.  Gait and station: Station is stable eyes open.  Gait is spastic.  She has a foot drop bilaterally.    She can not tandem.  Romberg positive  Reflexes: Deep tendon reflexes are symmetric and normal in arms, 3+ at knees.       DIAGNOSTIC DATA (LABS, IMAGING, TESTING) - I reviewed patient records, labs, notes, testing and imaging myself where available.     ASSESSMENT AND PLAN  MULTIPLE SCLEROSIS  Spastic gait  Urinary frequency  Other fatigue  Transverse myelitis (HCC)  Dysesthesia  Neck pain  Attention deficit disorder, unspecified hyperactivity presence    1.   Right splenius capitis, C6C7 paraspinal, rhomboid and trapezius trigger point injection with 80 mg Depo-Medrol in [place] using sterile technique. She tolerated the procedure well and there were no complications. 2.   Renew Adderall XR 15 mg by mouth every morning to help with her attentional and focus difficulties.   3.  She will follow-up with the orthotist about her braces. 4.   She will increase the imipramine to 2 pills at night. If sleep is not improved, she will let us know and I will add doxepin 10 mg instead  5.   RTC 6 months, sooner if problems   Lillybeth Tal A. Felecia Shelling, MD, PhD 41/66/0630, 1:60 AM Certified in Neurology, Clinical Neurophysiology, Sleep Medicine, Pain Medicine and Neuroimaging  [facility] [address], [place], [place] [address] [phone]

## 2017-10-19 NOTE — Telephone Encounter (Signed)
Rx. awaiting RAS sig/fim 

## 2017-10-19 NOTE — Telephone Encounter (Signed)
Adderall rx. given to pt. during ov this am/fim

## 2017-10-19 NOTE — Addendum Note (Signed)
Addended by: France Ravens I on: 10/19/2017 08:05 AM   Modules accepted: Orders

## 2017-11-14 ENCOUNTER — Telehealth: Payer: Self-pay | Admitting: Neurology

## 2017-11-14 MED ORDER — AMPHETAMINE-DEXTROAMPHET ER 15 MG PO CP24
15.0000 mg | ORAL_CAPSULE | ORAL | 0 refills | Status: DC
Start: 2017-11-14 — End: 2017-12-16

## 2017-11-14 NOTE — Addendum Note (Signed)
Addended by: France Ravens I on: 11/14/2017 01:39 PM   Modules accepted: Orders

## 2017-11-14 NOTE — Telephone Encounter (Signed)
Patient requesting refill of amphetamine-dextroamphetamine (ADDERALL XR) 15 MG 24 hr capsule.

## 2017-11-14 NOTE — Telephone Encounter (Signed)
Rx. up front [facility]/fim 

## 2017-11-14 NOTE — Telephone Encounter (Signed)
Rx. awaiting RAS sig/fim 

## 2017-12-16 ENCOUNTER — Telehealth: Payer: Self-pay | Admitting: Neurology

## 2017-12-16 MED ORDER — AMPHETAMINE-DEXTROAMPHET ER 15 MG PO CP24: 15.0000 mg | ORAL_CAPSULE | ORAL | 0 refills | Status: DC

## 2017-12-16 NOTE — Telephone Encounter (Signed)
Pt calling for a refill of amphetamine-dextroamphetamine (ADDERALL XR) 15 MG 24 hr capsule, she is aware the office will reopen on 12-26

## 2017-12-16 NOTE — Telephone Encounter (Signed)
Rx. up front [facility]/fim 

## 2017-12-16 NOTE — Addendum Note (Signed)
Addended by: France Ravens I on: 12/16/2017 09:13 AM   Modules accepted: Orders

## 2017-12-16 NOTE — Telephone Encounter (Signed)
Rx. awaiting RAS sig/fim 

## 2017-12-28 DIAGNOSIS — K5730 Diverticulosis of large intestine without perforation or abscess without bleeding: Secondary | ICD-10-CM | POA: Diagnosis not present

## 2017-12-28 DIAGNOSIS — Z85038 Personal history of other malignant neoplasm of large intestine: Secondary | ICD-10-CM | POA: Diagnosis not present

## 2017-12-28 DIAGNOSIS — K52839 Microscopic colitis, unspecified: Secondary | ICD-10-CM | POA: Diagnosis not present

## 2018-01-03 DIAGNOSIS — K52839 Microscopic colitis, unspecified: Secondary | ICD-10-CM | POA: Diagnosis not present

## 2018-01-04 DIAGNOSIS — Z803 Family history of malignant neoplasm of breast: Secondary | ICD-10-CM | POA: Diagnosis not present

## 2018-01-04 DIAGNOSIS — Z1231 Encounter for screening mammogram for malignant neoplasm of breast: Secondary | ICD-10-CM | POA: Diagnosis not present

## 2018-01-12 ENCOUNTER — Other Ambulatory Visit (HOSPITAL_COMMUNITY): Payer: Self-pay | Admitting: *Deleted

## 2018-01-13 ENCOUNTER — Encounter (HOSPITAL_COMMUNITY): Payer: PPO

## 2018-01-20 ENCOUNTER — Ambulatory Visit (HOSPITAL_COMMUNITY)
Admission: RE | Admit: 2018-01-20 | Discharge: 2018-01-20 | Disposition: A | Discharge status: Home / Self Care | Payer: PPO | Source: Ambulatory Visit | Attending: Internal Medicine | Admitting: Internal Medicine

## 2018-01-20 DIAGNOSIS — M810 Age-related osteoporosis without current pathological fracture: Secondary | ICD-10-CM | POA: Insufficient documentation

## 2018-01-20 MED ORDER — DENOSUMAB 60 MG/ML ~~LOC~~ SOLN
60.0000 mg | Freq: Once | SUBCUTANEOUS | Status: AC
  Administered 2018-01-20: 60 mg via SUBCUTANEOUS
  Filled 2018-01-20: qty 1

## 2018-01-23 ENCOUNTER — Telehealth: Payer: Self-pay | Admitting: Neurology

## 2018-01-23 NOTE — Telephone Encounter (Signed)
Pt request refill for amphetamine-dextroamphetamine (ADDERALL XR) 15 MG 24 hr capsule

## 2018-01-24 MED ORDER — AMPHETAMINE-DEXTROAMPHET ER 15 MG PO CP24: 15.0000 mg | ORAL_CAPSULE | ORAL | 0 refills | Status: DC

## 2018-01-24 NOTE — Telephone Encounter (Signed)
Pt aware that Rx is at the front

## 2018-01-24 NOTE — Telephone Encounter (Signed)
Last written 12/16/17. Last OV 10/19/17. Rx printed, awaiting Dr. Felecia Shelling sig.

## 2018-02-08 ENCOUNTER — Telehealth: Payer: Self-pay | Admitting: Neurology

## 2018-02-08 NOTE — Telephone Encounter (Signed)
Pt currently at [employer], she says she forgot to ask for a note to be excused from Dr Felecia Shelling. Pt has been told that if before 9:30 something could be faxed over to [facility], pt states the note must state what she is seen for(imability to know when she needs to use the bathroom, fatigue , in abilty to focus.  Please mention all these things in note) @ [phone] she could be excused, if not she will have to go in for jury Duty.Pt also says the [place] will need to know when fax is coming, please call pt and make aware as soon as possible

## 2018-02-08 NOTE — Telephone Encounter (Signed)
Spoke with pt. She has been excused from jury duty/fim

## 2018-02-13 DIAGNOSIS — R05 Cough: Secondary | ICD-10-CM | POA: Diagnosis not present

## 2018-02-13 DIAGNOSIS — K219 Gastro-esophageal reflux disease without esophagitis: Secondary | ICD-10-CM | POA: Diagnosis not present

## 2018-02-13 DIAGNOSIS — J111 Influenza due to unidentified influenza virus with other respiratory manifestations: Secondary | ICD-10-CM | POA: Diagnosis not present

## 2018-02-13 DIAGNOSIS — Z6820 Body mass index (BMI) 20.0-20.9, adult: Secondary | ICD-10-CM | POA: Diagnosis not present

## 2018-02-13 DIAGNOSIS — J09X2 Influenza due to identified novel influenza A virus with other respiratory manifestations: Secondary | ICD-10-CM | POA: Diagnosis not present

## 2018-02-13 DIAGNOSIS — J302 Other seasonal allergic rhinitis: Secondary | ICD-10-CM | POA: Diagnosis not present

## 2018-02-28 ENCOUNTER — Telehealth: Payer: Self-pay | Admitting: Neurology

## 2018-02-28 MED ORDER — AMPHETAMINE-DEXTROAMPHET ER 15 MG PO CP24: 15.0000 mg | ORAL_CAPSULE | ORAL | 0 refills | Status: DC

## 2018-02-28 NOTE — Telephone Encounter (Signed)
Rx. up front [facility]/fim 

## 2018-02-28 NOTE — Telephone Encounter (Signed)
Patient requesting refill of  amphetamine-dextroamphetamine (ADDERALL XR) 15 MG 24 hr capsule.

## 2018-02-28 NOTE — Telephone Encounter (Signed)
Rx. awaiting RAS sig/fim 

## 2018-02-28 NOTE — Addendum Note (Signed)
Addended by: France Ravens I on: 02/28/2018 01:26 PM   Modules accepted: Orders

## 2018-03-14 DIAGNOSIS — M25572 Pain in left ankle and joints of left foot: Secondary | ICD-10-CM | POA: Diagnosis not present

## 2018-03-24 DIAGNOSIS — M25572 Pain in left ankle and joints of left foot: Secondary | ICD-10-CM | POA: Diagnosis not present

## 2018-03-27 ENCOUNTER — Ambulatory Visit (HOSPITAL_COMMUNITY)
Admission: EM | Admit: 2018-03-27 | Discharge: 2018-03-27 | Disposition: A | Discharge status: Home / Self Care | Payer: PPO | Attending: Family Medicine | Admitting: Family Medicine

## 2018-03-27 ENCOUNTER — Other Ambulatory Visit: Payer: Self-pay

## 2018-03-27 ENCOUNTER — Other Ambulatory Visit: Payer: Self-pay | Admitting: Neurology

## 2018-03-27 ENCOUNTER — Ambulatory Visit (INDEPENDENT_AMBULATORY_CARE_PROVIDER_SITE_OTHER): Payer: PPO

## 2018-03-27 ENCOUNTER — Encounter (HOSPITAL_COMMUNITY): Payer: Self-pay

## 2018-03-27 DIAGNOSIS — M545 Low back pain, unspecified: Secondary | ICD-10-CM

## 2018-03-27 DIAGNOSIS — M25511 Pain in right shoulder: Secondary | ICD-10-CM | POA: Diagnosis not present

## 2018-03-27 MED ORDER — AMPHETAMINE-DEXTROAMPHET ER 15 MG PO CP24: 15.0000 mg | ORAL_CAPSULE | ORAL | 0 refills | Status: DC

## 2018-03-27 NOTE — Telephone Encounter (Signed)
Pt requesting a refill for amphetamine-dextroamphetamine (ADDERALL XR) 15 MG 24 hr capsule sent to [facility] on [address]
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
## 2018-03-28 MED ORDER — AMPHETAMINE-DEXTROAMPHET ER 15 MG PO CP24: 15.0000 mg | ORAL_CAPSULE | ORAL | 0 refills | Status: DC

## 2018-03-28 NOTE — Addendum Note (Signed)
Addended by: France Ravens I on: 03/28/2018 02:44 PM   Modules accepted: Orders

## 2018-04-06 ENCOUNTER — Ambulatory Visit (HOSPITAL_COMMUNITY)
Admission: EM | Admit: 2018-04-06 | Discharge: 2018-04-06 | Disposition: A | Discharge status: Home / Self Care | Payer: PPO | Attending: Internal Medicine | Admitting: Internal Medicine

## 2018-04-06 ENCOUNTER — Ambulatory Visit (INDEPENDENT_AMBULATORY_CARE_PROVIDER_SITE_OTHER): Payer: PPO

## 2018-04-06 ENCOUNTER — Encounter (HOSPITAL_COMMUNITY): Payer: Self-pay | Admitting: Emergency Medicine

## 2018-04-06 DIAGNOSIS — M25562 Pain in left knee: Secondary | ICD-10-CM

## 2018-04-06 DIAGNOSIS — S8992XA Unspecified injury of left lower leg, initial encounter: Secondary | ICD-10-CM | POA: Diagnosis not present

## 2018-04-06 MED ORDER — DICLOFENAC SODIUM 75 MG PO TBEC: 75.0000 mg | DELAYED_RELEASE_TABLET | Freq: Two times a day (BID) | ORAL | 0 refills | Status: AC
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
## 2018-04-06 NOTE — Discharge Instructions (Signed)
Please begin diclofenac twice daily for the next 10 days.  You may also continue to use Tylenol.  If you have more relief with ibuprofen instead of diclofenac, you may continue to use ibuprofen.  Please ice knee multiple times a day.  Please continue to follow-up with physical therapy.  If you have persistent pain and sensations of instability please follow-up with orthopedics or your primary care provider.
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
## 2018-04-19 ENCOUNTER — Telehealth: Payer: Self-pay | Admitting: Neurology

## 2018-04-19 ENCOUNTER — Encounter: Payer: Self-pay | Admitting: Neurology

## 2018-04-19 ENCOUNTER — Other Ambulatory Visit: Payer: Self-pay | Admitting: Neurology

## 2018-04-19 ENCOUNTER — Other Ambulatory Visit: Payer: Self-pay

## 2018-04-19 ENCOUNTER — Ambulatory Visit (INDEPENDENT_AMBULATORY_CARE_PROVIDER_SITE_OTHER): Payer: PPO | Admitting: Neurology

## 2018-04-19 VITALS — BP 128/72 | HR 102 | Resp 16 | Ht 70.0 in | Wt 143.0 lb

## 2018-04-19 DIAGNOSIS — R5383 Other fatigue: Secondary | ICD-10-CM | POA: Diagnosis not present

## 2018-04-19 DIAGNOSIS — F988 Other specified behavioral and emotional disorders with onset usually occurring in childhood and adolescence: Secondary | ICD-10-CM

## 2018-04-19 DIAGNOSIS — G4489 Other headache syndrome: Secondary | ICD-10-CM | POA: Diagnosis not present

## 2018-04-19 DIAGNOSIS — R208 Other disturbances of skin sensation: Secondary | ICD-10-CM | POA: Diagnosis not present

## 2018-04-19 DIAGNOSIS — R261 Paralytic gait: Secondary | ICD-10-CM | POA: Diagnosis not present

## 2018-04-19 DIAGNOSIS — G373 Acute transverse myelitis in demyelinating disease of central nervous system: Secondary | ICD-10-CM | POA: Diagnosis not present

## 2018-04-19 DIAGNOSIS — G35 Multiple sclerosis: Secondary | ICD-10-CM

## 2018-04-19 DIAGNOSIS — R350 Frequency of micturition: Secondary | ICD-10-CM

## 2018-04-19 DIAGNOSIS — M5481 Occipital neuralgia: Secondary | ICD-10-CM

## 2018-04-19 MED ORDER — AMPHET-DEXTROAMPHET 3-BEAD ER 25 MG PO CP24: ORAL_CAPSULE | ORAL | 0 refills | Status: DC

## 2018-04-19 NOTE — Telephone Encounter (Signed)
duplicate/fim  

## 2018-04-19 NOTE — Telephone Encounter (Signed)
Patient requesting call back from nurse regarding medication not covered by insurance. Best call back is [phone]
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
## 2018-04-19 NOTE — Telephone Encounter (Signed)
Patient is at [facility] on [address]. They are telling her insurance will not cover Amphet-Dextroamphet 3-Bead ER 25 MG CP24 but will cover Adderal which she has taken before. Please call patient and discuss.

## 2018-04-19 NOTE — Addendum Note (Signed)
Addended by: France Ravens I on: 04/19/2018 03:23 PM   Modules accepted: Orders

## 2018-04-20 MED ORDER — AMPHETAMINE-DEXTROAMPHET ER 25 MG PO CP24: 25.0000 mg | ORAL_CAPSULE | ORAL | 0 refills | Status: DC

## 2018-04-20 NOTE — Addendum Note (Signed)
Addended by: France Ravens I on: 04/20/2018 03:51 PM   Modules accepted: Orders

## 2018-04-26 ENCOUNTER — Telehealth: Payer: Self-pay | Admitting: Neurology

## 2018-04-26 ENCOUNTER — Ambulatory Visit: Payer: PPO | Attending: Internal Medicine | Admitting: Physical Therapy

## 2018-04-26 ENCOUNTER — Encounter: Payer: Self-pay | Admitting: Physical Therapy

## 2018-04-26 ENCOUNTER — Other Ambulatory Visit: Payer: Self-pay

## 2018-04-26 DIAGNOSIS — M6281 Muscle weakness (generalized): Secondary | ICD-10-CM

## 2018-04-26 DIAGNOSIS — R262 Difficulty in walking, not elsewhere classified: Secondary | ICD-10-CM | POA: Diagnosis not present

## 2018-04-26 DIAGNOSIS — M25562 Pain in left knee: Secondary | ICD-10-CM

## 2018-04-26 DIAGNOSIS — M25511 Pain in right shoulder: Secondary | ICD-10-CM | POA: Diagnosis not present
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
## 2018-04-26 NOTE — Telephone Encounter (Signed)
LMOM (identified vm) that she was to stop the Imipramine.  Please call back if she has other questions/fim

## 2018-04-26 NOTE — Telephone Encounter (Signed)
Pt states that she knows Dr Felecia Shelling had her to stop one medication and increased the dosage of 1.  Pt does not recall which of the medications were to be stopped, she knows which of them were to be increased.  Please call

## 2018-04-27 ENCOUNTER — Ambulatory Visit: Payer: PPO

## 2018-04-28 ENCOUNTER — Encounter: Payer: Self-pay | Admitting: Physical Therapy

## 2018-04-28 ENCOUNTER — Ambulatory Visit: Payer: PPO | Admitting: Physical Therapy

## 2018-04-28 DIAGNOSIS — M25511 Pain in right shoulder: Secondary | ICD-10-CM

## 2018-04-28 DIAGNOSIS — M25562 Pain in left knee: Secondary | ICD-10-CM | POA: Diagnosis not present

## 2018-04-28 DIAGNOSIS — R262 Difficulty in walking, not elsewhere classified: Secondary | ICD-10-CM

## 2018-04-28 DIAGNOSIS — M6281 Muscle weakness (generalized): Secondary | ICD-10-CM
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
## 2018-05-03 ENCOUNTER — Ambulatory Visit: Payer: PPO | Admitting: Physical Therapy

## 2018-05-03 ENCOUNTER — Encounter: Payer: Self-pay | Admitting: Physical Therapy

## 2018-05-03 DIAGNOSIS — R262 Difficulty in walking, not elsewhere classified: Secondary | ICD-10-CM

## 2018-05-03 DIAGNOSIS — M6281 Muscle weakness (generalized): Secondary | ICD-10-CM

## 2018-05-03 DIAGNOSIS — M25511 Pain in right shoulder: Secondary | ICD-10-CM

## 2018-05-03 DIAGNOSIS — M25562 Pain in left knee: Secondary | ICD-10-CM | POA: Diagnosis not present
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
## 2018-05-05 ENCOUNTER — Ambulatory Visit: Payer: PPO | Admitting: Physical Therapy

## 2018-05-05 ENCOUNTER — Encounter: Payer: Self-pay | Admitting: Physical Therapy

## 2018-05-05 DIAGNOSIS — M25562 Pain in left knee: Secondary | ICD-10-CM

## 2018-05-05 DIAGNOSIS — M6281 Muscle weakness (generalized): Secondary | ICD-10-CM

## 2018-05-05 DIAGNOSIS — M25511 Pain in right shoulder: Secondary | ICD-10-CM

## 2018-05-05 DIAGNOSIS — R262 Difficulty in walking, not elsewhere classified: Secondary | ICD-10-CM

## 2018-05-05 NOTE — Therapy (Signed)
[place] [place], [place], [address] Phone: [phone]   Fax:  [phone]  Physical Therapy Treatment  Patient Details  Name: [name] MRN: [id number] Date of Birth: 1951-01-11 Referring Provider: Debara Pickett PA   Encounter Date: 05/05/2018  PT End of Session - 05/05/18 1200    Visit Number  4    Number of Visits  13    PT Start Time  0930    PT Stop Time  1856    PT Time Calculation (min)  45 min    Activity Tolerance  Patient tolerated treatment well    Behavior During Therapy  [facility] for tasks assessed/performed       Past Medical History:  Diagnosis Date   Arthritis    back   Dysesthesia    bilateral feet   Family history of adverse reaction to anesthesia    mother -- ponv   Fatigue    Fecal incontinence    Frequent falls    due to MS   GERD (gastroesophageal reflux disease)    Heart murmur    History of colon polyps    2013;  2015   History of neoplasm    06-22-2011  s/p  appendectomy --  per path , low grade appendiceal mucinous neoplasm    Migraines    right side    Mitral valve prolapse    states no problem   Multiple sclerosis [facility]) 1989   neurologist-  dr sater   Neurogenic bladder    Osteoporosis    PONV (postoperative nausea and vomiting)    Pulmonary nodule, right    pulmologist-  dr Milinda Hirschfeld--  stable per ct 05-17-2016   Short of breath on exertion    Sjogren's disease ([place])    dryness of eyes, mouth   Spastic gait    Transverse myelitis ([facility])    T4   Wears bilateral ankle braces    AFO braces for stability    Past Surgical History:  Procedure Laterality Date   ANAL RECTAL MANOMETRY N/A 08/18/2016   Procedure: ANO RECTAL MANOMETRY;  Surgeon: Leighton Ruff, MD;  [facility];  Service: Endoscopy;  Laterality: N/A;   ANTERIOR CERVICAL DECOMP/DISCECTOMY FUSION  2004;   2003   C4 -- C5 (2004)/   C3 -- C4 (2003)    APPENDECTOMY  06/22/2011   laparoscopic   BILATERAL SALPINGOOPHORECTOMY  1982   BLADDER SUSPENSION  1994   sling   BREAST ENHANCEMENT SURGERY Bilateral    BREAST IMPLANT REMOVAL Bilateral 12/03/2008   BUNIONECTOMY     CATARACT EXTRACTION W/ INTRAOCULAR LENS  IMPLANT, BILATERAL  2015   ELBOW SURGERY Right    HEMORROIDECTOMY  08/05/2010;  2013   INSERTION SACRAL NERVE STIMULATOR TEST WIRE  09-28-2016   dr Marcello Moores   INTERCOSTAL NERVE BLOCK  2015   occipital   RECTAL ULTRASOUND N/A 08/18/2016   Procedure: RECTAL ULTRASOUND;  Surgeon: Leighton Ruff, MD;  [facility];  Service: Endoscopy;  Laterality: N/A;   SHOULDER ARTHROSCOPY Left    SHOULDER ARTHROSCOPY WITH DISTAL CLAVICLE RESECTION Right 09/21/2007   w/  Acrominoplasty,  Debridement Rotator Cuff,  CA ligament release   TONSILLECTOMY  age 35   TRIGGER FINGER RELEASE Left 01/16/2015   Procedure: LEFT THUMB TRIGGER RELEASE ;  Surgeon: Leanora Cover, MD;  Location: [facility];  Service: Orthopedics;  Laterality: Left;   VAGINAL HYSTERECTOMY  1981    There  were no vitals filed for this visit.  Subjective Assessment - 05/05/18 1158    Subjective  Arrives with report of soreness following PT apointments, more muscular than joint.  Does cont to have some Left knee pain.                        [facility] Adult PT Treatment/Exercise - 05/05/18 0001      Knee/Hip Exercises: Standing   Other Standing Knee Exercises  sit to stand 2x10    Other Standing Knee Exercises  lateral, A/P weight shifts.  Tactile cues for righting reactions      Knee/Hip Exercises: Seated   Clamshell with TheraBand  Red unilateral 2x10ea b    Marching  Strengthening;Both;1 set;10 reps 2#    Marching Limitations  decreased Left ROM      Knee/Hip Exercises: Supine   Quad Sets  Strengthening;Both;2 sets;10 reps    Short Arc Quad Sets  Strengthening;Left;2 sets;10 reps    Bridges  AROM;Strengthening;2 sets;10 reps     Bridges Limitations  minimal elevation, more of a PPT    Straight Leg Raises  AAROM;Strengthening;Both;2 sets;5 reps;Limitations    Straight Leg Raises Limitations  limited lift off the mat      Knee/Hip Exercises: Sidelying   Clams  10 reps x 2 sets, with only about 1-2 inch lift    Other Sidelying Knee/Hip Exercises  reverve clams x 10 reps               PT Short Term Goals - 04/26/18 1058      PT SHORT TERM GOAL #1   Title  Pt will be independent in her HEP    Time  3    Period  Weeks    Status  New      PT SHORT TERM GOAL #2   Title  Pt will be able to perform sit to stand using bilateral UE support with bilateral knees flexed and in correct sitting position.     Baseline  Pt leaning to right with left knee extended. and increased time for transfer.     Time  3    Period  Weeks    Status  New      PT SHORT TERM GOAL #3   Title  Further evaluate R shoulder pain    Time  3    Period  Weeks    Status  New        PT Long Term Goals - 05/03/18 1044      PT LONG TERM GOAL #1   Title  Pt will be able to perform 5 time sit to stand in </= 20 seconds using UE support as needed.     Baseline  32 seconds    Time  6    Period  Weeks    Status  New      PT LONG TERM GOAL #2   Title  Pt will be able to report left knee pain </= 3/10 when walking >/= 500 feet on community surfaces using her single point cane.     Time  6    Status  New      PT LONG TERM GOAL #3   Title  Pt will improve her bilateral LE strength to >/= 3+/5 in order to improve gait and funtional mobility.     Time  6    Period  Weeks    Status  New  PT LONG TERM GOAL #4   Title  Pt will improve her FOTO score from 74% limitation to </= 64% limitation.     Time  6    Period  Weeks    Status  New            Plan - 05/05/18 1200    Clinical Impression Statement  Cont with considerable compensatory strategies stemming from MS.  Left quad, hip flexor, and glute weakness persist.  [place]  decreased LEft proprioceptive awareness and is heavility dependent on Right LE for transfers sit to stand.  Skilled PT needed to maximize Left LE strength gains to impirove function and restore PLOF       Patient will benefit from skilled therapeutic intervention in order to improve the following deficits and impairments:     Visit Diagnosis: Acute pain of left knee  Difficulty in walking, not elsewhere classified  Acute pain of right shoulder  Muscle weakness (generalized)     Problem List Patient Active Problem List   Diagnosis Date Noted   Excessive daytime sleepiness 10/17/2017   Vitamin D deficiency 12/09/2016   Attention deficit disorder 12/09/2016   Sjogren's disease ([place]) 05/03/2016   Other headache syndrome 09/08/2015   Transverse myelitis ([place]) 05/06/2015   Dysesthesia 05/06/2015   Neck pain 05/06/2015   Occipital neuralgia 05/06/2015   Spastic gait 02/05/2015   Urinary frequency 02/05/2015   Other fatigue 02/05/2015   Multiple lung nodules on CT 09/24/2013   Nipple discharge 08/04/2012   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

## 2018-05-08 ENCOUNTER — Encounter: Payer: Self-pay | Admitting: Physical Therapy

## 2018-05-08 ENCOUNTER — Ambulatory Visit: Payer: PPO | Admitting: Physical Therapy

## 2018-05-08 DIAGNOSIS — R262 Difficulty in walking, not elsewhere classified: Secondary | ICD-10-CM

## 2018-05-08 DIAGNOSIS — M25511 Pain in right shoulder: Secondary | ICD-10-CM

## 2018-05-08 DIAGNOSIS — M25562 Pain in left knee: Secondary | ICD-10-CM

## 2018-05-08 NOTE — Therapy (Signed)
[facility] [place], [place], [address] Phone: [phone]   Fax:  [phone]  Physical Therapy Treatment  Patient Details  Name: [name] MRN: [id number] Date of Birth: August 19, 1951 Referring Provider: Debara Pickett PA   Encounter Date: 05/08/2018  PT End of Session - 05/08/18 1646    Visit Number  5    Number of Visits  13    PT Start Time  1600    PT Stop Time  1646    PT Time Calculation (min)  46 min    Activity Tolerance  Patient tolerated treatment well    Behavior During Therapy  [facility] for tasks assessed/performed       Past Medical History:  Diagnosis Date  . Arthritis    back  . Dysesthesia    bilateral feet  . Family history of adverse reaction to anesthesia    mother -- ponv  . Fatigue   . Fecal incontinence   . Frequent falls    due to MS  . GERD (gastroesophageal reflux disease)   . Heart murmur   . History of colon polyps    2013;  2015  . 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 Migraines    right side   . Mitral valve prolapse    states no problem  . Multiple sclerosis [facility]) 1989   neurologist-  dr Felecia Shelling  . Neurogenic bladder   . Osteoporosis   . PONV (postoperative nausea and vomiting)   . Pulmonary nodule, right    pulmologist-  dr Milinda Hirschfeld--  stable per ct 05-17-2016  . Short of breath on exertion   . Sjogren's disease ([place])    dryness of eyes, mouth  . Spastic gait   . 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 Wears bilateral ankle braces    AFO braces for stability    Past Surgical History:  Procedure Laterality Date  . ANAL RECTAL MANOMETRY N/A 08/18/2016   Procedure: ANO RECTAL MANOMETRY;  Surgeon: Leighton Ruff, MD;  Location: [facility];  Service: Endoscopy;  Laterality: N/A;  . ANTERIOR CERVICAL DECOMP/DISCECTOMY FUSION  2004;   2003   C4 -- C5 (2004)/   C3 -- C4 (2003)  .  APPENDECTOMY  06/22/2011   laparoscopic  . BILATERAL SALPINGOOPHORECTOMY  1982  . BLADDER SUSPENSION  1994   sling  . BREAST ENHANCEMENT SURGERY Bilateral   . BREAST IMPLANT REMOVAL Bilateral 12/03/2008  . BUNIONECTOMY    . CATARACT EXTRACTION W/ INTRAOCULAR LENS  IMPLANT, BILATERAL  2015  . ELBOW SURGERY Right   . HEMORROIDECTOMY  08/05/2010;  2013  . INSERTION SACRAL NERVE STIMULATOR TEST WIRE  09-28-2016   dr Marcello Moores  . INTERCOSTAL NERVE BLOCK  2015   occipital  . RECTAL ULTRASOUND N/A 08/18/2016   Procedure: RECTAL ULTRASOUND;  Surgeon: Leighton Ruff, MD;  Location[facility];  Service: Endoscopy;  Laterality: N/A;  . SHOULDER ARTHROSCOPY Left   . SHOULDER ARTHROSCOPY WITH DISTAL CLAVICLE RESECTION Right 09/21/2007   w/  Acrominoplasty,  Debridement Rotator Cuff,  CA ligament release  . TONSILLECTOMY  age 51  . TRIGGER FINGER RELEASE Left 01/16/2015   Procedure: LEFT THUMB TRIGGER RELEASE ;  Surgeon: Leanora Cover, MD;  Location: [place];  Service: Orthopedics;  Laterality: Left;  Marland Kitchen VAGINAL HYSTERECTOMY  1981    There  were no vitals filed for this visit.  Subjective Assessment - 05/08/18 1603    Subjective  Pt reports that she may be a little better today    Currently in Pain?  Yes    Pain Score  5     Pain Location  Knee    Pain Orientation  Left                       [facility] Adult PT Treatment/Exercise - 05/08/18 0001      Knee/Hip Exercises: Aerobic   Nustep  L4 x 6 min      Knee/Hip Exercises: Standing   Other Standing Knee Exercises  sit to stand 3x5 airex on mat abel     Other Standing Knee Exercises  lateral, A/P weight shifts.  Tactile cues for righting reactions      Knee/Hip Exercises: Seated   Long Arc Quad  Both;1 set;5 reps    Clamshell with TheraBand  Red unilateral 2x10 each    Marching  Strengthening;Both;1 set;10 reps    Marching Limitations  decreased Left ROM      Knee/Hip Exercises: Supine   Quad Sets   Strengthening;Both;2 sets;10 reps    Bridges  AROM;Strengthening;2 sets;10 reps    Bridges Limitations  minimal elevation, more of a PPT    Straight Leg Raises  AAROM;Strengthening;Both;2 sets;5 reps;Limitations    Straight Leg Raises Limitations  limited lift off the mat               PT Short Term Goals - 04/26/18 1058      PT SHORT TERM GOAL #1   Title  Pt will be independent in her HEP    Time  3    Period  Weeks    Status  New      PT SHORT TERM GOAL #2   Title  Pt will be able to perform sit to stand using bilateral UE support with bilateral knees flexed and in correct sitting position.     Baseline  Pt leaning to right with left knee extended. and increased time for transfer.     Time  3    Period  Weeks    Status  New      PT SHORT TERM GOAL #3   Title  Further evaluate R shoulder pain    Time  3    Period  Weeks    Status  New        PT Long Term Goals - 05/03/18 1044      PT LONG TERM GOAL #1   Title  Pt will be able to perform 5 time sit to stand in </= 20 seconds using UE support as needed.     Baseline  32 seconds    Time  6    Period  Weeks    Status  New      PT LONG TERM GOAL #2   Title  Pt will be able to report left knee pain </= 3/10 when walking >/= 500 feet on community surfaces using her single point cane.     Time  6    Status  New      PT LONG TERM GOAL #3   Title  Pt will improve her bilateral LE strength to >/= 3+/5 in order to improve gait and funtional mobility.     Time  6    Period  Weeks    Status  New  PT LONG TERM GOAL #4   Title  Pt will improve her FOTO score from 74% limitation to </= 64% limitation.     Time  6    Period  Weeks    Status  New            Plan - 05/08/18 1647    Clinical Impression Statement  Pt gives good effort with all of today's activities, Bilat LE weakness persist due to pt having MS. Compensatory strategies uses with sit to stand stemming from weakness due to MS. Pt continues to  have decrease proprioceptive awareness  depending on RLE mostly for locomotion.    Rehab Potential  Good    Clinical Impairments Affecting Rehab Potential  MS, spastic gait, fatigue, ADD    PT Treatment/Interventions  ADLs/Self Care Home Management;Cryotherapy;Electrical Stimulation;Moist Heat;Gait training;Stair training;Functional mobility training;Therapeutic activities;Therapeutic exercise;Balance training;Neuromuscular re-education;Patient/family education;Manual techniques;Taping;Passive range of motion    PT Next Visit Plan  LE strength and proprioceptive awareness       Patient will benefit from skilled therapeutic intervention in order to improve the following deficits and impairments:  Abnormal gait, Pain, Decreased balance, Decreased strength, Decreased mobility, Postural dysfunction, Decreased activity tolerance, Difficulty walking, Impaired UE functional use  Visit Diagnosis: Acute pain of left knee  Difficulty in walking, not elsewhere classified  Acute pain of right shoulder     Problem List Patient Active Problem List   Diagnosis Date Noted  . Excessive daytime sleepiness 10/17/2017  . Vitamin D deficiency 12/09/2016  . Attention deficit disorder 12/09/2016  . Sjogren's disease ([place]) 05/03/2016  . Other headache syndrome 09/08/2015  . Transverse myelitis ([place]) 05/06/2015  . Dysesthesia 05/06/2015  . Neck pain 05/06/2015  . Occipital neuralgia 05/06/2015  . Spastic gait 02/05/2015  . Urinary frequency 02/05/2015  . Other fatigue 02/05/2015  . Multiple lung nodules on CT 09/24/2013  . Nipple discharge 08/04/2012  . 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 VOCAL CORD DISORDER 10/21/2009  . DYSPHAGIA, OROPHARYNGEAL PHASE 10/21/2009  . PULMONARY NODULE, RIGHT UPPER LOBE 01/22/2009  . DYSPNEA 09/24/2008  . BRONCHITIS, OBSTRUCTIVE CHRONIC, WITH EXACERBATION 09/10/2008  . G E R D 09/10/2008  . MULTIPLE SCLEROSIS 03/13/2008  . MIGRAINE, CHRONIC 03/13/2008  . CHRONIC  RHINITIS 03/13/2008  . COUGH 03/13/2008    Scot Jun, PTA 05/08/2018, 4:51 PM  [facility] [place], [place], [address] Phone: [phone]   Fax:  [phone]  Name: [name] MRN: [id number] Date of Birth: July 14, 1951

## 2018-05-11 ENCOUNTER — Ambulatory Visit: Payer: PPO | Admitting: Physical Therapy

## 2018-05-11 DIAGNOSIS — R262 Difficulty in walking, not elsewhere classified: Secondary | ICD-10-CM

## 2018-05-11 DIAGNOSIS — M25562 Pain in left knee: Secondary | ICD-10-CM

## 2018-05-11 DIAGNOSIS — M6281 Muscle weakness (generalized): Secondary | ICD-10-CM

## 2018-05-11 DIAGNOSIS — M25511 Pain in right shoulder: Secondary | ICD-10-CM

## 2018-05-11 NOTE — Therapy (Signed)
[facility], [place], [address] Phone: [phone]   Fax:  [phone]  Physical Therapy Treatment  Patient Details  Name: [name] MRN: [id number] Date of Birth: Aug 20, 1951 Referring Provider: Debara Pickett PA   Encounter Date: 05/11/2018  PT End of Session - 05/11/18 0931    Visit Number  6    Number of Visits  13    PT Start Time  0845    PT Stop Time  0935    PT Time Calculation (min)  50 min       Past Medical History:  Diagnosis Date  . Arthritis    back  . Dysesthesia    bilateral feet  . Family history of adverse reaction to anesthesia    mother -- ponv  . Fatigue   . Fecal incontinence   . Frequent falls    due to MS  . GERD (gastroesophageal reflux disease)   . Heart murmur   . History of colon polyps    2013;  2015  . 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 Migraines    right side   . Mitral valve prolapse    states no problem  . Multiple sclerosis [facility]) 1989   neurologist-  dr Felecia Shelling  . Neurogenic bladder   . Osteoporosis   . PONV (postoperative nausea and vomiting)   . Pulmonary nodule, right    pulmologist-  dr Milinda Hirschfeld--  stable per ct 05-17-2016  . Short of breath on exertion   . Sjogren's disease ([place])    dryness of eyes, mouth  . Spastic gait   . Transverse myelitis ([place])    T4  . Wears bilateral ankle braces    AFO braces for stability    Past Surgical History:  Procedure Laterality Date  . ANAL RECTAL MANOMETRY N/A 08/18/2016   Procedure: ANO RECTAL MANOMETRY;  Surgeon: Leighton Ruff, MD;  [facility];  Service: Endoscopy;  Laterality: N/A;  . ANTERIOR CERVICAL DECOMP/DISCECTOMY FUSION  2004;   2003   C4 -- C5 (2004)/   C3 -- C4 (2003)  . APPENDECTOMY  06/22/2011   laparoscopic  . BILATERAL SALPINGOOPHORECTOMY  1982  . BLADDER SUSPENSION  1994   sling  .  BREAST ENHANCEMENT SURGERY Bilateral   . BREAST IMPLANT REMOVAL Bilateral 12/03/2008  . BUNIONECTOMY    . CATARACT EXTRACTION W/ INTRAOCULAR LENS  IMPLANT, BILATERAL  2015  . ELBOW SURGERY Right   . HEMORROIDECTOMY  08/05/2010;  2013  . INSERTION SACRAL NERVE STIMULATOR TEST WIRE  09-28-2016   dr Marcello Moores  . INTERCOSTAL NERVE BLOCK  2015   occipital  . RECTAL ULTRASOUND N/A 08/18/2016   Procedure: RECTAL ULTRASOUND;  Surgeon: Leighton Ruff, MD;  [facility];  Service: Endoscopy;  Laterality: N/A;  . SHOULDER ARTHROSCOPY Left   . SHOULDER ARTHROSCOPY WITH DISTAL CLAVICLE RESECTION Right 09/21/2007   w/  Acrominoplasty,  Debridement Rotator Cuff,  CA ligament release  . TONSILLECTOMY  age 45  . TRIGGER FINGER RELEASE Left 01/16/2015   Procedure: LEFT THUMB TRIGGER RELEASE ;  Surgeon: Leanora Cover, MD;  Location: [place];  Service: Orthopedics;  Laterality: Left;  Marland Kitchen VAGINAL HYSTERECTOMY  1981    There were no vitals filed for this visit.  Subjective Assessment - 05/11/18 0851    Subjective  shld better than  knee. at rest pain 3/10 and then mvmt increases it to 6-7    Currently in Pain?  Yes    Pain Score  6     Pain Location  Knee    Pain Orientation  Left                       [facility] Adult PT Treatment/Exercise - 05/11/18 0001      Knee/Hip Exercises: Aerobic   Nustep  L4 x 6 min      Knee/Hip Exercises: Machines for Strengthening   Cybex Leg Press  20# 3 sets 5 PTA assistance and verb cuing      Knee/Hip Exercises: Supine   Short Arc Quad Sets  Strengthening;Left;2 sets;10 reps    Other Supine Knee/Hip Exercises  bridge ,KTC and obl wiith ball 10 reps assistance to control Left knee    Other Supine Knee/Hip Exercises  ball squeeze 10 clam and marching 10 reps      Modalities   Modalities  Iontophoresis      Iontophoresis   Type of Iontophoresis  Dexamethasone    Location  Left lat knee    Dose  1.1 cc dex    Time  4 hour patch       Manual Therapy   Manual Therapy  Soft tissue mobilization    Manual therapy comments  IT and lat knee               PT Short Term Goals - 05/11/18 5670      PT SHORT TERM GOAL #1   Title  Pt will be independent in her HEP    Status  Achieved      PT SHORT TERM GOAL #2   Title  Pt will be able to perform sit to stand using bilateral UE support with bilateral knees flexed and in correct sitting position.     Baseline  still compensates    Status  Partially Met      PT SHORT TERM GOAL #3   Title  Further evaluate R shoulder pain    Baseline  pt verb shld is improving    Status  On-going        PT Long Term Goals - 05/11/18 0853      PT LONG TERM GOAL #1   Title  Pt will be able to perform 5 time sit to stand in </= 20 seconds using UE support as needed.     Status  On-going      PT LONG TERM GOAL #2   Title  Pt will be able to report left knee pain </= 3/10 when walking >/= 500 feet on community surfaces using her single point cane.     Status  On-going      PT LONG TERM GOAL #3   Title  Pt will improve her bilateral LE strength to >/= 3+/5 in order to improve gait and funtional mobility.     Status  On-going            Plan - 05/11/18 0931    Clinical Impression Statement  pain limited today in Left lat knee-very tender and tight in IT and lat knee- tolerated STW fair and added ionto. pt still with many compensations and weakness d/t MS. slow progression with goals    PT Treatment/Interventions  ADLs/Self Care Home Management;Cryotherapy;Electrical Stimulation;Moist Heat;Gait training;Stair training;Functional mobility training;Therapeutic activities;Therapeutic exercise;Balance training;Neuromuscular re-education;Patient/family education;Manual techniques;Taping;Passive range of motion    PT  Next Visit Plan  LE strenght and proprioceptive awareness       Patient will benefit from skilled therapeutic intervention in order to improve the following  deficits and impairments:  Abnormal gait, Pain, Decreased balance, Decreased strength, Decreased mobility, Postural dysfunction, Decreased activity tolerance, Difficulty walking, Impaired UE functional use  Visit Diagnosis: Acute pain of left knee  Difficulty in walking, not elsewhere classified  Acute pain of right shoulder  Muscle weakness (generalized)     Problem List Patient Active Problem List   Diagnosis Date Noted  . Excessive daytime sleepiness 10/17/2017  . Vitamin D deficiency 12/09/2016  . Attention deficit disorder 12/09/2016  . Sjogren's disease ([place]) 05/03/2016  . Other headache syndrome 09/08/2015  . Transverse myelitis ([place]) 05/06/2015  . Dysesthesia 05/06/2015  . Neck pain 05/06/2015  . Occipital neuralgia 05/06/2015  . Spastic gait 02/05/2015  . Urinary frequency 02/05/2015  . Other fatigue 02/05/2015  . Multiple lung nodules on CT 09/24/2013  . Nipple discharge 08/04/2012  . 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 VOCAL CORD DISORDER 10/21/2009  . DYSPHAGIA, OROPHARYNGEAL PHASE 10/21/2009  . PULMONARY NODULE, RIGHT UPPER LOBE 01/22/2009  . DYSPNEA 09/24/2008  . BRONCHITIS, OBSTRUCTIVE CHRONIC, WITH EXACERBATION 09/10/2008  . G E R D 09/10/2008  . MULTIPLE SCLEROSIS 03/13/2008  . MIGRAINE, CHRONIC 03/13/2008  . CHRONIC RHINITIS 03/13/2008  . COUGH 03/13/2008    Livio Ledwith,ANGIE PTA 05/11/2018, 9:33 AM  [facility] [address] [place], [place], [address] Phone: [phone]   Fax:  [phone]  Name: [name] MRN: [id number] Date of Birth: 05-26-1951

## 2018-05-12 ENCOUNTER — Telehealth: Payer: Self-pay | Admitting: Neurology

## 2018-05-12 NOTE — Telephone Encounter (Signed)
Per vo by Dr. Felecia Shelling - with symptoms only being present for 40 minutes, it is hard to know if this represents an exacerbation just yet.  If symptoms worsen over the weekend, she may call the on-call physician or she can call him back on Monday, if not better.

## 2018-05-12 NOTE — Telephone Encounter (Signed)
Pt called she is experiencing right side of face is tingling including rt side of lips, fingertips, rt shoulder is numb and tingling. Pt said this has been constant for the past 40 minutes. She is unsure if this is the beginning of an exacerbation or something else. Please call to advise.

## 2018-05-12 NOTE — Telephone Encounter (Signed)
Spoke to patient - she is agreeable to the plan below.

## 2018-05-15 ENCOUNTER — Ambulatory Visit (INDEPENDENT_AMBULATORY_CARE_PROVIDER_SITE_OTHER): Payer: PPO | Admitting: Neurology

## 2018-05-15 ENCOUNTER — Ambulatory Visit: Payer: PPO | Admitting: Physical Therapy

## 2018-05-15 ENCOUNTER — Encounter: Payer: Self-pay | Admitting: Neurology

## 2018-05-15 VITALS — BP 132/82 | HR 84 | Ht 70.0 in | Wt 144.0 lb

## 2018-05-15 DIAGNOSIS — G373 Acute transverse myelitis in demyelinating disease of central nervous system: Secondary | ICD-10-CM | POA: Diagnosis not present

## 2018-05-15 DIAGNOSIS — R208 Other disturbances of skin sensation: Secondary | ICD-10-CM

## 2018-05-15 DIAGNOSIS — R5383 Other fatigue: Secondary | ICD-10-CM

## 2018-05-15 DIAGNOSIS — G35 Multiple sclerosis: Secondary | ICD-10-CM | POA: Diagnosis not present

## 2018-05-15 DIAGNOSIS — G35D Multiple sclerosis, unspecified: Secondary | ICD-10-CM

## 2018-05-15 DIAGNOSIS — R261 Paralytic gait: Secondary | ICD-10-CM | POA: Diagnosis not present
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
## 2018-05-15 NOTE — Telephone Encounter (Signed)
I spoke with patient and confirmed that her symptoms are unchanged from Friday, nothing is better or worse. I will discuss with Dr. Felecia Shelling.

## 2018-05-15 NOTE — Telephone Encounter (Signed)
Pt called this morning, her symptoms have remained the same and have been constant over the weekend. Please call to advise at [phone]

## 2018-05-15 NOTE — Telephone Encounter (Signed)
I spoke with Dr. Felecia Shelling and he would like to see patient in the office today. I called patient to make her aware and we have scheduled an appointment for this afternoon. Patient was very Patent attorney.

## 2018-05-16 DIAGNOSIS — G35 Multiple sclerosis: Secondary | ICD-10-CM | POA: Diagnosis not present

## 2018-05-17 DIAGNOSIS — G35 Multiple sclerosis: Secondary | ICD-10-CM | POA: Diagnosis not present

## 2018-05-18 DIAGNOSIS — G35 Multiple sclerosis: Secondary | ICD-10-CM | POA: Diagnosis not present

## 2018-05-19 ENCOUNTER — Ambulatory Visit: Payer: PPO | Admitting: Physical Therapy

## 2018-05-19 ENCOUNTER — Other Ambulatory Visit: Payer: Self-pay | Admitting: Neurology

## 2018-05-19 MED ORDER — AMPHETAMINE-DEXTROAMPHET ER 25 MG PO CP24: 25.0000 mg | ORAL_CAPSULE | ORAL | 0 refills | Status: DC

## 2018-05-19 NOTE — Telephone Encounter (Signed)
Pt is asking for a refill on amphetamine-dextroamphetamine (ADDERALL XR) 25 MG 24 hr capsule She is asking it be sent to  [id number] - [place], [place] [phone] (Phone) [phone] (Fax)    Also pt states she was asked if she was to have a taper dose of prednisone by the infusion suite.  Pt is asking for a call to discuss that.

## 2018-05-19 NOTE — Addendum Note (Signed)
Addended by: France Ravens I on: 05/19/2018 09:04 AM   Modules accepted: Orders

## 2018-05-23 ENCOUNTER — Ambulatory Visit: Payer: PPO | Admitting: Physical Therapy

## 2018-05-23 ENCOUNTER — Telehealth: Payer: Self-pay | Admitting: Physical Therapy

## 2018-05-26 ENCOUNTER — Ambulatory Visit: Payer: PPO | Admitting: Physical Therapy

## 2018-06-08 DIAGNOSIS — E559 Vitamin D deficiency, unspecified: Secondary | ICD-10-CM | POA: Diagnosis not present

## 2018-06-08 DIAGNOSIS — Z79899 Other long term (current) drug therapy: Secondary | ICD-10-CM | POA: Diagnosis not present

## 2018-06-08 DIAGNOSIS — R82998 Other abnormal findings in urine: Secondary | ICD-10-CM | POA: Diagnosis not present

## 2018-06-08 DIAGNOSIS — M810 Age-related osteoporosis without current pathological fracture: Secondary | ICD-10-CM | POA: Diagnosis not present

## 2018-06-15 DIAGNOSIS — M25562 Pain in left knee: Secondary | ICD-10-CM | POA: Diagnosis not present

## 2018-06-15 DIAGNOSIS — G35 Multiple sclerosis: Secondary | ICD-10-CM | POA: Diagnosis not present

## 2018-06-15 DIAGNOSIS — M810 Age-related osteoporosis without current pathological fracture: Secondary | ICD-10-CM | POA: Diagnosis not present

## 2018-06-15 DIAGNOSIS — I700 Atherosclerosis of aorta: Secondary | ICD-10-CM | POA: Diagnosis not present

## 2018-06-15 DIAGNOSIS — Z6820 Body mass index (BMI) 20.0-20.9, adult: Secondary | ICD-10-CM | POA: Diagnosis not present

## 2018-06-15 DIAGNOSIS — Z0000 Encounter for general adult medical examination without abnormal findings: Secondary | ICD-10-CM | POA: Diagnosis not present

## 2018-06-15 DIAGNOSIS — G608 Other hereditary and idiopathic neuropathies: Secondary | ICD-10-CM | POA: Diagnosis not present

## 2018-06-15 DIAGNOSIS — G373 Acute transverse myelitis in demyelinating disease of central nervous system: Secondary | ICD-10-CM | POA: Diagnosis not present

## 2018-06-15 DIAGNOSIS — M419 Scoliosis, unspecified: Secondary | ICD-10-CM | POA: Diagnosis not present

## 2018-06-15 DIAGNOSIS — M3500 Sicca syndrome, unspecified: Secondary | ICD-10-CM | POA: Diagnosis not present

## 2018-06-15 DIAGNOSIS — Z1389 Encounter for screening for other disorder: Secondary | ICD-10-CM | POA: Diagnosis not present

## 2018-06-15 DIAGNOSIS — J302 Other seasonal allergic rhinitis: Secondary | ICD-10-CM | POA: Diagnosis not present

## 2018-06-20 ENCOUNTER — Other Ambulatory Visit: Payer: Self-pay | Admitting: Neurology

## 2018-06-20 ENCOUNTER — Encounter: Payer: Self-pay | Admitting: Physical Therapy

## 2018-06-20 ENCOUNTER — Ambulatory Visit: Payer: PPO | Attending: Internal Medicine | Admitting: Physical Therapy

## 2018-06-20 ENCOUNTER — Other Ambulatory Visit: Payer: Self-pay

## 2018-06-20 DIAGNOSIS — R278 Other lack of coordination: Secondary | ICD-10-CM | POA: Diagnosis not present

## 2018-06-20 DIAGNOSIS — M25562 Pain in left knee: Secondary | ICD-10-CM | POA: Insufficient documentation

## 2018-06-20 DIAGNOSIS — Z1212 Encounter for screening for malignant neoplasm of rectum: Secondary | ICD-10-CM | POA: Diagnosis not present

## 2018-06-20 DIAGNOSIS — R262 Difficulty in walking, not elsewhere classified: Secondary | ICD-10-CM | POA: Insufficient documentation

## 2018-06-20 DIAGNOSIS — R2689 Other abnormalities of gait and mobility: Secondary | ICD-10-CM | POA: Insufficient documentation

## 2018-06-20 MED ORDER — AMPHETAMINE-DEXTROAMPHET ER 25 MG PO CP24: 25.0000 mg | ORAL_CAPSULE | ORAL | 0 refills | Status: DC

## 2018-06-20 NOTE — Telephone Encounter (Signed)
Pt request refill for amphetamine-dextroamphetamine (ADDERALL XR) 25 MG 24 hr capsule [facility] to [facility]
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
## 2018-06-23 ENCOUNTER — Encounter: Payer: Self-pay | Admitting: Physical Therapy

## 2018-06-23 ENCOUNTER — Ambulatory Visit: Payer: PPO | Admitting: Physical Therapy

## 2018-06-23 DIAGNOSIS — M25562 Pain in left knee: Secondary | ICD-10-CM

## 2018-06-23 DIAGNOSIS — R278 Other lack of coordination: Secondary | ICD-10-CM

## 2018-06-23 DIAGNOSIS — R262 Difficulty in walking, not elsewhere classified: Secondary | ICD-10-CM

## 2018-06-23 DIAGNOSIS — R2689 Other abnormalities of gait and mobility: Secondary | ICD-10-CM

## 2018-06-23 NOTE — Therapy (Signed)
[facility] [address], [place], [place], [address] Phone: [phone]   Fax:  [phone]  Physical Therapy Treatment  Patient Details  Name: [name] MRN: [id number] Date of Birth: 1951-08-16 Referring Provider: Dr. Joylene Draft   Encounter Date: 06/23/2018  PT End of Session - 06/23/18 1229    Visit Number  2    Number of Visits  13    Date for PT Re-Evaluation  08/15/18    PT Start Time  0932    PT Stop Time  1015    PT Time Calculation (min)  43 min    Activity Tolerance  Patient tolerated treatment well       Past Medical History:  Diagnosis Date  . Arthritis    back  . Dysesthesia    bilateral feet  . Family history of adverse reaction to anesthesia    mother -- ponv  . Fatigue   . Fecal incontinence   . Frequent falls    due to MS  . GERD (gastroesophageal reflux disease)   . Heart murmur   . History of colon polyps    2013;  2015  . 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 Migraines    right side   . Mitral valve prolapse    states no problem  . Multiple sclerosis [facility]) 1989   neurologist-  dr Felecia Shelling  . Neurogenic bladder   . Osteoporosis   . PONV (postoperative nausea and vomiting)   . Pulmonary nodule, right    pulmologist-  dr Milinda Hirschfeld--  stable per ct 05-17-2016  . Short of breath on exertion   . Sjogren's disease ([place])    dryness of eyes, mouth  . Spastic gait   . 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 Wears bilateral ankle braces    AFO braces for stability    Past Surgical History:  Procedure Laterality Date  . ANAL RECTAL MANOMETRY N/A 08/18/2016   Procedure: ANO RECTAL MANOMETRY;  Surgeon: Leighton Ruff, MD;  [facility];  Service: Endoscopy;  Laterality: N/A;  . ANTERIOR CERVICAL DECOMP/DISCECTOMY FUSION  2004;   2003   C4 -- C5 (2004)/   C3 -- C4 (2003)  . APPENDECTOMY  06/22/2011    laparoscopic  . BILATERAL SALPINGOOPHORECTOMY  1982  . BLADDER SUSPENSION  1994   sling  . BREAST ENHANCEMENT SURGERY Bilateral   . BREAST IMPLANT REMOVAL Bilateral 12/03/2008  . BUNIONECTOMY    . CATARACT EXTRACTION W/ INTRAOCULAR LENS  IMPLANT, BILATERAL  2015  . ELBOW SURGERY Right   . HEMORROIDECTOMY  08/05/2010;  2013  . INSERTION SACRAL NERVE STIMULATOR TEST WIRE  09-28-2016   dr Marcello Moores  . INTERCOSTAL NERVE BLOCK  2015   occipital  . RECTAL ULTRASOUND N/A 08/18/2016   Procedure: RECTAL ULTRASOUND;  Surgeon: Leighton Ruff, MD;  [facility];  Service: Endoscopy;  Laterality: N/A;  . SHOULDER ARTHROSCOPY Left   . SHOULDER ARTHROSCOPY WITH DISTAL CLAVICLE RESECTION Right 09/21/2007   w/  Acrominoplasty,  Debridement Rotator Cuff,  CA ligament release  . TONSILLECTOMY  age 67  . TRIGGER FINGER RELEASE Left 01/16/2015   Procedure: LEFT THUMB TRIGGER RELEASE ;  Surgeon: Leanora Cover, MD;  Location: [place];  Service: Orthopedics;  Laterality: Left;  Marland Kitchen VAGINAL HYSTERECTOMY  1981    There were no vitals filed for  this visit.  Subjective Assessment - 06/23/18 1229    Subjective  My knee was hurting yesterday but I do think the tape helped.  I like the cane at the lowered height.   She is eager to do the iontophoresis.     Currently in Pain?  Yes    Pain Score  4     Pain Location  Knee    Pain Orientation  Left;Lateral    Pain Type  Acute pain                       [facility] Adult PT Treatment/Exercise - 06/23/18 0001      Lumbar Exercises: Supine   Other Supine Lumbar Exercises  transverse abdominus brace 5x    Other Supine Lumbar Exercises  ab brace with UE elevation 5x      Knee/Hip Exercises: Seated   Ball Squeeze  10    Other Seated Knee/Hip Exercises  foam roll push down with ball squeeze 10x      Knee/Hip Exercises: Supine   Other Supine Knee/Hip Exercises  manual isometric clam 5x avoid touching lateral knee secondary to pain      Other Supine Knee/Hip Exercises  manual adduction hip isometric 5x      Knee/Hip Exercises: Sidelying   Clams  attempted left clams but motor control is poor      Iontophoresis   Type of Iontophoresis  Dexamethasone    Location  Left lat knee    Dose  4 mg/ml #1    Time  4-6 hour patch      Manual Therapy   Soft tissue mobilization  Instrument assisted soft tissue to quads, gastroc fanning, sweeping very gentle avoided lateral knee secondary to hypersensitivity    Kinesiotex  Facilitate Muscle Y strip over left quads             PT Education - 06/23/18 1228    Education provided  Yes    Education Details  seated ball squeeze, seated hip abduction isometric;  chair or table abdominal brace push down     Person(s) Educated  Patient    Methods  Explanation;Demonstration;Handout    Comprehension  Returned demonstration;Verbalized understanding       PT Short Term Goals - 06/20/18 1808      PT SHORT TERM GOAL #1   Title  Pt will be demonstrate knowledge in basic self care strategies for knee pain control     Time  4    Period  Weeks    Status  New    Target Date  07/18/18      PT SHORT TERM GOAL #2   Title  Left knee pain improved by 30% with basic ADLs    Time  4    Status  New      PT SHORT TERM GOAL #3   Title  The patient will have BERG balance score of 20/56 indicating improved safety and balance with standing and walking    Time  4    Period  Weeks    Status  New        PT Long Term Goals - 06/20/18 1810      PT LONG TERM GOAL #1   Title  The patient will be independent with safe self progression of HEP    Time  8    Period  Weeks    Status  New    Target Date  08/15/18  PT LONG TERM GOAL #2   Title  Pt will be able to report left knee pain </= 3/10 when walking >/= 300 feet on community surfaces using her single point cane.     Time  8    Period  Weeks    Status  New      PT LONG TERM GOAL #3   Title  The patient will report a 60%  reduction in left knee pain with usual ADLs    Time  8    Period  Weeks    Status  New      PT LONG TERM GOAL #4   Title  BERG balance score will improve to 25/56 indicating improved balance with standing and walking     Time  8    Period  Weeks    Status  New            Plan - 06/23/18 1228    Clinical Impression Statement  The patient continues to complain of lateral knee pain.  She is quite sensitive to touch in that area.  She has poor motor control in her LEs associated with her MS  and weakness in the hip which  may be contributing to lateral knee inflammation.  Knee hip alignment and motor control was better in sitting.   Initiated HEP and iontophoresis.  She had to cancel her next appt for a dental procedure.      Rehab Potential  Good    Clinical Impairments Affecting Rehab Potential  MS, spastic gait, fatigue, risk of falls;  lateral knee hypersensitivity     PT Frequency  2x / week    PT Duration  8 weeks    PT Treatment/Interventions  ADLs/Self Care Home Management;Iontophoresis 4mg /ml Dexamethasone;Ultrasound;Moist Heat;Cryotherapy;Electrical Stimulation;Therapeutic exercise;Therapeutic activities;Gait training;Functional mobility training;Neuromuscular re-education;Patient/family education;Manual techniques;Taping;Vasopneumatic Device    PT Next Visit Plan  assess response to ionto and continue;  KT as needed to facilitate quads;  hip strengthening in sitting;  very low level balance;   Nu-step    PT Home Exercise Plan  Access Code: [email or web address]        Patient will benefit from skilled therapeutic intervention in order to improve the following deficits and impairments:  Abnormal gait, Pain, Decreased balance, Decreased strength, Decreased mobility, Postural dysfunction, Decreased activity tolerance, Difficulty walking, Impaired perceived functional ability  Visit Diagnosis: Acute pain of left knee  Difficulty in walking, not elsewhere classified  Other abnormalities of  gait and mobility  Other lack of coordination     Problem List Patient Active Problem List   Diagnosis Date Noted  . Excessive daytime sleepiness 10/17/2017  . Vitamin D deficiency 12/09/2016  . Attention deficit disorder 12/09/2016  . Sjogren's disease ([place]) 05/03/2016  . Other headache syndrome 09/08/2015  . Transverse myelitis ([place]) 05/06/2015  . Dysesthesia 05/06/2015  . Neck pain 05/06/2015  . Occipital neuralgia 05/06/2015  . Spastic gait 02/05/2015  . Urinary frequency 02/05/2015  . Other fatigue 02/05/2015  . Multiple lung nodules on CT 09/24/2013  . Nipple discharge 08/04/2012  . 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 VOCAL CORD DISORDER 10/21/2009  . DYSPHAGIA, OROPHARYNGEAL PHASE 10/21/2009  . PULMONARY NODULE, RIGHT UPPER LOBE 01/22/2009  . DYSPNEA 09/24/2008  . BRONCHITIS, OBSTRUCTIVE CHRONIC, WITH EXACERBATION 09/10/2008  . G E R D 09/10/2008  . MULTIPLE SCLEROSIS 03/13/2008  . MIGRAINE, CHRONIC 03/13/2008  . CHRONIC RHINITIS 03/13/2008  . COUGH 03/13/2008   Ruben Im, PT 06/23/18 12:38 PM Phone: [phone] Fax: [phone]  Alvera Singh 06/23/2018, 12:37 PM  [facility] [address] [place], [place], [address] Phone: [phone]   Fax:  [phone]  Name: EMILY MASSAR MRN: [id number] Date of Birth: [birth year]

## 2018-06-23 NOTE — Patient Instructions (Signed)
   Access Code: [email or web address]  URL: [email or web address]  Date: 06/23/2018  Prepared by: Ruben Im   Exercises  Seated Hip Adduction Isometrics with Diona Foley - 10 reps - 1 sets - 1x daily - 7x weekly  Seated Isometric Hip Abduction - 10 reps - 1 sets - 1x daily - 7x weekly  Seated Transversus Abdominis Bracing - 10 reps - 1 sets - 1x daily - 7x weekly      IONTOPHORESIS PATIENT PRECAUTIONS & CONTRAINDICATIONS:  . Redness under one or both electrodes can occur.  This characterized by a uniform redness that usually disappears within 12 hours of treatment. . Small pinhead size blisters may result in response to the drug.  Contact your physician if the problem persists more than 24 hours. . On rare occasions, iontophoresis therapy can result in temporary skin reactions such as rash, inflammation, irritation or burns.  The skin reactions may be the result of individual sensitivity to the ionic solution used, the condition of the skin at the start of treatment, reaction to the materials in the electrodes, allergies or sensitivity to dexamethasone, or a poor connection between the patch and your skin.  Discontinue using iontophoresis if you have any of these reactions and report to your therapist. . Remove the Patch or electrodes if you have any undue sensation of pain or burning during the treatment and report discomfort to your therapist. . Tell your Therapist if you have had known adverse reactions to the application of electrical current. . If using the Patch, the LED light will turn off when treatment is complete and the patch can be removed.  Approximate treatment time is 1-3 hours.  Remove the patch when light goes off or after 6 hours. . The Patch can be worn during normal activity, however excessive motion where the electrodes have been placed can cause poor contact between the skin and the electrode or uneven electrical current resulting in greater risk of skin  irritation. Marland Kitchen Keep out of the reach of children.   . DO NOT use if you have a cardiac pacemaker or any other electrically sensitive implanted device. . DO NOT use if you have a known sensitivity to dexamethasone. . DO NOT use during Magnetic Resonance Imaging (MRI). . DO NOT use over broken or compromised skin (e.g. sunburn, cuts, or acne) due to the increased risk of skin reaction. . DO NOT SHAVE over the area to be treated:  To establish good contact between the Patch and the skin, excessive hair may be clipped. . DO NOT place the Patch or electrodes on or over your eyes, directly over your heart, or brain. . DO NOT reuse the Patch or electrodes as this may cause burns to occur.    Ruben Im PT [facility] [address] [place], [place] [address] Phone # [phone] Fax [phone]

## 2018-06-26 ENCOUNTER — Encounter: Payer: PPO | Admitting: Physical Therapy

## 2018-06-28 ENCOUNTER — Encounter: Payer: PPO | Admitting: Physical Therapy

## 2018-07-03 ENCOUNTER — Ambulatory Visit: Payer: PPO | Admitting: Physical Therapy

## 2018-07-03 ENCOUNTER — Telehealth: Payer: Self-pay | Admitting: Neurology

## 2018-07-03 MED ORDER — 4-AMINOPYRIDINE POWD: 5.0000 mg | Freq: Three times a day (TID) | 3 refills | Status: DC

## 2018-07-03 NOTE — Telephone Encounter (Signed)
Pt request refill for Dalfampridine (4-AMINOPYRIDINE) POWD sent to [place]. She has 2 capsules left

## 2018-07-03 NOTE — Telephone Encounter (Signed)
Rx. escribed to [place] as requested/fim

## 2018-07-05 ENCOUNTER — Encounter: Payer: Self-pay | Admitting: Physical Therapy

## 2018-07-05 ENCOUNTER — Ambulatory Visit: Payer: PPO | Attending: Internal Medicine | Admitting: Physical Therapy

## 2018-07-05 DIAGNOSIS — M25562 Pain in left knee: Secondary | ICD-10-CM | POA: Diagnosis not present

## 2018-07-05 DIAGNOSIS — R2689 Other abnormalities of gait and mobility: Secondary | ICD-10-CM | POA: Diagnosis not present

## 2018-07-05 DIAGNOSIS — R262 Difficulty in walking, not elsewhere classified: Secondary | ICD-10-CM | POA: Diagnosis not present

## 2018-07-05 DIAGNOSIS — R278 Other lack of coordination: Secondary | ICD-10-CM | POA: Insufficient documentation

## 2018-07-05 NOTE — Therapy (Signed)
[facility] [address], [place], [place], [address] Phone: [phone]   Fax:  [phone]  Physical Therapy Treatment  Patient Details  Name: [name] MRN: [id number] Date of Birth: 1951-02-19 Referring Provider: Dr. Joylene Draft   Encounter Date: 07/05/2018  PT End of Session - 07/05/18 1100    Visit Number  3    Number of Visits  13    Date for PT Re-Evaluation  08/15/18    PT Start Time  7530    PT Stop Time  1100    PT Time Calculation (min)  45 min    Activity Tolerance  Patient tolerated treatment well    Behavior During Therapy  [facility] for tasks assessed/performed       Past Medical History:  Diagnosis Date  . Arthritis    back  . Dysesthesia    bilateral feet  . Family history of adverse reaction to anesthesia    mother -- ponv  . Fatigue   . Fecal incontinence   . Frequent falls    due to MS  . GERD (gastroesophageal reflux disease)   . Heart murmur   . History of colon polyps    2013;  2015  . 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 Migraines    right side   . Mitral valve prolapse    states no problem  . Multiple sclerosis [facility]) 1989   neurologist-  dr Felecia Shelling  . Neurogenic bladder   . Osteoporosis   . PONV (postoperative nausea and vomiting)   . Pulmonary nodule, right    pulmologist-  dr Milinda Hirschfeld--  stable per ct 05-17-2016  . Short of breath on exertion   . Sjogren's disease ([place])    dryness of eyes, mouth  . Spastic gait   . 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 Wears bilateral ankle braces    AFO braces for stability    Past Surgical History:  Procedure Laterality Date  . ANAL RECTAL MANOMETRY N/A 08/18/2016   Procedure: ANO RECTAL MANOMETRY;  Surgeon: Leighton Ruff, MD;  [facility];  Service: Endoscopy;  Laterality: N/A;  . ANTERIOR CERVICAL DECOMP/DISCECTOMY FUSION  2004;   2003   C4 -- C5  (2004)/   C3 -- C4 (2003)  . APPENDECTOMY  06/22/2011   laparoscopic  . BILATERAL SALPINGOOPHORECTOMY  1982  . BLADDER SUSPENSION  1994   sling  . BREAST ENHANCEMENT SURGERY Bilateral   . BREAST IMPLANT REMOVAL Bilateral 12/03/2008  . BUNIONECTOMY    . CATARACT EXTRACTION W/ INTRAOCULAR LENS  IMPLANT, BILATERAL  2015  . ELBOW SURGERY Right   . HEMORROIDECTOMY  08/05/2010;  2013  . INSERTION SACRAL NERVE STIMULATOR TEST WIRE  09-28-2016   dr Marcello Moores  . INTERCOSTAL NERVE BLOCK  2015   occipital  . RECTAL ULTRASOUND N/A 08/18/2016   Procedure: RECTAL ULTRASOUND;  Surgeon: Leighton Ruff, MD;  [facility];  Service: Endoscopy;  Laterality: N/A;  . SHOULDER ARTHROSCOPY Left   . SHOULDER ARTHROSCOPY WITH DISTAL CLAVICLE RESECTION Right 09/21/2007   w/  Acrominoplasty,  Debridement Rotator Cuff,  CA ligament release  . TONSILLECTOMY  age 69  . TRIGGER FINGER RELEASE Left 01/16/2015   Procedure: LEFT THUMB TRIGGER RELEASE ;  Surgeon: Leanora Cover, MD;  Location: [place];  Service: Orthopedics;  Laterality: Left;  Marland Kitchen VAGINAL HYSTERECTOMY  1981    There were no vitals filed for this visit.  Subjective Assessment - 07/05/18 1018    Subjective  I felt slightly better and my left knee did not burn as much.     Pertinent History  MS, s/p cerivcal fusion    Limitations  House hold activities;Walking    How long can you sit comfortably?  unlimited    How long can you walk comfortably?  50 yards;  uses 2 canes at times    Diagnostic tests  xrays of knee after accident     Patient Stated Goals  I want my balance to be better;  make core stronger;  make me less fatigue;  Keep going!    Currently in Pain?  Yes    Pain Score  4  can raise to 7/10    Pain Location  Knee    Pain Orientation  Left;Lateral    Pain Descriptors / Indicators  Aching;Sore    Pain Type  Acute pain    Pain Onset  More than a month ago    Pain Frequency  Intermittent    Aggravating Factors   twist  knee a certain way; being on it; hyperextending it; up and down stairs ( husband assist)    Pain Relieving Factors  mobilization to correct leg length    Multiple Pain Sites  No                       [facility] Adult PT Treatment/Exercise - 07/05/18 0001      Knee/Hip Exercises: Aerobic   Nustep  level 1 seat#9, arm#9 while assessing patient progress      Knee/Hip Exercises: Supine   Short Arc Quad Sets  Strengthening;Left;AAROM;1 set;15 reps    Heel Slides  AAROM;Strengthening;Left;1 set;15 reps      Modalities   Modalities  Iontophoresis      Iontophoresis   Type of Iontophoresis  Dexamethasone    Location  Left lat knee    Dose  4 mg/ml #2    Time  4-6 hour patch      Manual Therapy   Manual Therapy  Soft tissue mobilization    Manual therapy comments  tried patella mobilization on left but too much pain    Soft tissue mobilization  gentle massage on lateral patella tendon, lateral left knee where the ITB band by the knee; along the distal lateral hamstring    Kinesiotex  Facilitate Muscle Y strip over left quads      Kinesiotix   Facilitate Muscle   2 strips V formation from tibial tubercle and up                PT Short Term Goals - 07/05/18 1105      PT SHORT TERM GOAL #1   Title  Pt will be demonstrate knowledge in basic self care strategies for knee pain control     Time  4    Period  Weeks    Status  On-going      PT SHORT TERM GOAL #2   Title  Left knee pain improved by 30% with basic ADLs    Baseline  still compensates    Time  4    Period  Weeks    Status  On-going      PT SHORT TERM GOAL #3   Title  The patient will have BERG balance score of 20/56 indicating improved safety and balance with standing and  walking    Time  4    Period  Weeks    Status  On-going        PT Long Term Goals - 06/20/18 1810      PT LONG TERM GOAL #1   Title  The patient will be independent with safe self progression of HEP    Time  8    Period   Weeks    Status  New    Target Date  08/15/18      PT LONG TERM GOAL #2   Title  Pt will be able to report left knee pain </= 3/10 when walking >/= 300 feet on community surfaces using her single point cane.     Time  8    Period  Weeks    Status  New      PT LONG TERM GOAL #3   Title  The patient will report a 60% reduction in left knee pain with usual ADLs    Time  8    Period  Weeks    Status  New      PT LONG TERM GOAL #4   Title  BERG balance score will improve to 25/56 indicating improved balance with standing and walking     Time  8    Period  Weeks    Status  New            Plan - 07/05/18 1101    Clinical Impression Statement  Patient has not met goals yet . Patient is having trouble remembering due to her MS.  Patient has some swelling and tender to touch around the lateral left knee along the left distal lateral hamstring and  distal ITB.  Patient continues to limp on the left and decreased weightbear on the left.  Patient will benefit from skilled therapy to reduce her pain and improve her overall mobility.      Rehab Potential  Good    Clinical Impairments Affecting Rehab Potential  MS, spastic gait, fatigue, risk of falls;  lateral knee hypersensitivity     PT Frequency  2x / week    PT Duration  8 weeks    PT Treatment/Interventions  ADLs/Self Care Home Management;Iontophoresis 57m/ml Dexamethasone;Ultrasound;Moist Heat;Cryotherapy;Electrical Stimulation;Therapeutic exercise;Therapeutic activities;Gait training;Functional mobility training;Neuromuscular re-education;Patient/family education;Manual techniques;Taping;Vasopneumatic Device    PT Next Visit Plan  continue with ionto and KT tape.  Patient needs assistance with knee exercises and review of exercises due to memory    PT Home Exercise Plan  Access Code: [email or web address]    Recommended Other Services  MD signed initial evaluation    Consulted and Agree with Plan of Care  Patient       Patient will benefit  from skilled therapeutic intervention in order to improve the following deficits and impairments:  Abnormal gait, Pain, Decreased balance, Decreased strength, Decreased mobility, Postural dysfunction, Decreased activity tolerance, Difficulty walking, Impaired perceived functional ability  Visit Diagnosis: Acute pain of left knee  Difficulty in walking, not elsewhere classified  Other abnormalities of gait and mobility  Other lack of coordination     Problem List Patient Active Problem List   Diagnosis Date Noted  . Excessive daytime sleepiness 10/17/2017  . Vitamin D deficiency 12/09/2016  . Attention deficit disorder 12/09/2016  . Sjogren's disease ([place] 05/03/2016  . Other headache syndrome 09/08/2015  . [place]verse myelitis ([place] 05/06/2015  . Dysesthesia 05/06/2015  . Neck pain 05/06/2015  . Occipital neuralgia 05/06/2015  . Spastic gait 02/05/2015  .  Urinary frequency 02/05/2015  . Other fatigue 02/05/2015  . Multiple lung nodules on CT 09/24/2013  . Nipple discharge 08/04/2012  . 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 VOCAL CORD DISORDER 10/21/2009  . DYSPHAGIA, OROPHARYNGEAL PHASE 10/21/2009  . PULMONARY NODULE, RIGHT UPPER LOBE 01/22/2009  . DYSPNEA 09/24/2008  . BRONCHITIS, OBSTRUCTIVE CHRONIC, WITH EXACERBATION 09/10/2008  . G E R D 09/10/2008  . MULTIPLE SCLEROSIS 03/13/2008  . MIGRAINE, CHRONIC 03/13/2008  . CHRONIC RHINITIS 03/13/2008  . COUGH 03/13/2008    Earlie Counts, PT 07/05/18 11:06 AM   [facility] [address], [place], [place], [address] Phone: [phone]   Fax:  [phone]  Name: [name] MRN: [id number] Date of Birth: 09-Dec-1951

## 2018-07-10 ENCOUNTER — Encounter: Payer: Self-pay | Admitting: Physical Therapy

## 2018-07-10 ENCOUNTER — Ambulatory Visit: Payer: PPO | Admitting: Physical Therapy

## 2018-07-10 DIAGNOSIS — M25562 Pain in left knee: Secondary | ICD-10-CM | POA: Diagnosis not present

## 2018-07-10 DIAGNOSIS — R278 Other lack of coordination: Secondary | ICD-10-CM

## 2018-07-10 DIAGNOSIS — R2689 Other abnormalities of gait and mobility: Secondary | ICD-10-CM

## 2018-07-10 DIAGNOSIS — R262 Difficulty in walking, not elsewhere classified: Secondary | ICD-10-CM

## 2018-07-10 NOTE — Therapy (Signed)
[facility] [address] [place], [place], [address] Phone: [phone]   Fax:  [phone]  Physical Therapy Treatment  Patient Details  Name: [name] MRN: [id number] Date of Birth: January 29, 1951 Referring Provider: Dr. Joylene Draft   Encounter Date: 07/10/2018  PT End of Session - 07/10/18 1059    Visit Number  4    Number of Visits  13    Date for PT Re-Evaluation  08/15/18    PT Start Time  8563 came late    PT Stop Time  1100    PT Time Calculation (min)  20 min    Activity Tolerance  Patient tolerated treatment well;No increased pain    Behavior During Therapy  W[facility] for tasks assessed/performed       Past Medical History:  Diagnosis Date  . Arthritis    back  . Dysesthesia    bilateral feet  . Family history of adverse reaction to anesthesia    mother -- ponv  . Fatigue   . Fecal incontinence   . Frequent falls    due to MS  . GERD (gastroesophageal reflux disease)   . Heart murmur   . History of colon polyps    2013;  2015  . 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 Migraines    right side   . Mitral valve prolapse    states no problem  . Multiple sclerosis [facility]) 1989   neurologist-  dr Felecia Shelling  . Neurogenic bladder   . Osteoporosis   . PONV (postoperative nausea and vomiting)   . Pulmonary nodule, right    pulmologist-  dr Milinda Hirschfeld--  stable per ct 05-17-2016  . Short of breath on exertion   . Sjogren's disease ([place])    dryness of eyes, mouth  . Spastic gait   . 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 Wears bilateral ankle braces    AFO braces for stability    Past Surgical History:  Procedure Laterality Date  . ANAL RECTAL MANOMETRY N/A 08/18/2016   Procedure: ANO RECTAL MANOMETRY;  Surgeon: Leighton Ruff, MD;  [facility];  Service: Endoscopy;  Laterality: N/A;  . ANTERIOR CERVICAL DECOMP/DISCECTOMY FUSION   2004;   2003   C4 -- C5 (2004)/   C3 -- C4 (2003)  . APPENDECTOMY  06/22/2011   laparoscopic  . BILATERAL SALPINGOOPHORECTOMY  1982  . BLADDER SUSPENSION  1994   sling  . BREAST ENHANCEMENT SURGERY Bilateral   . BREAST IMPLANT REMOVAL Bilateral 12/03/2008  . BUNIONECTOMY    . CATARACT EXTRACTION W/ INTRAOCULAR LENS  IMPLANT, BILATERAL  2015  . ELBOW SURGERY Right   . HEMORROIDECTOMY  08/05/2010;  2013  . INSERTION SACRAL NERVE STIMULATOR TEST WIRE  09-28-2016   dr Marcello Moores  . INTERCOSTAL NERVE BLOCK  2015   occipital  . RECTAL ULTRASOUND N/A 08/18/2016   Procedure: RECTAL ULTRASOUND;  Surgeon: Leighton Ruff, MD;  [facility];  Service: Endoscopy;  Laterality: N/A;  . SHOULDER ARTHROSCOPY Left   . SHOULDER ARTHROSCOPY WITH DISTAL CLAVICLE RESECTION Right 09/21/2007   w/  Acrominoplasty,  Debridement Rotator Cuff,  CA ligament release  . TONSILLECTOMY  age 40  . TRIGGER FINGER RELEASE Left 01/16/2015   Procedure: LEFT THUMB TRIGGER RELEASE ;  Surgeon: Leanora Cover, MD;  Location: [facility];  Service: Orthopedics;  Laterality: Left;  .  VAGINAL HYSTERECTOMY  1981    There were no vitals filed for this visit.  Subjective Assessment - 07/10/18 1029    Subjective  I felt better after last visit.      Pertinent History  MS, s/p cerivcal fusion    Limitations  House hold activities;Walking    How long can you sit comfortably?  unlimited    How long can you walk comfortably?  50 yards;  uses 2 canes at times    Diagnostic tests  xrays of knee after accident     Patient Stated Goals  I want my balance to be better;  make core stronger;  make me less fatigue;  Keep going!    Currently in Pain?  Yes    Pain Location  Knee    Pain Orientation  Left    Pain Descriptors / Indicators  Aching;Sore    Pain Type  Acute pain    Pain Onset  More than a month ago    Pain Frequency  Intermittent    Aggravating Factors   twist knee a certain way; being on it; hyperextending  it; up and donw stairs ( husband assist)    Multiple Pain Sites  No                       [facility] Adult PT Treatment/Exercise - 07/10/18 0001      Knee/Hip Exercises: Aerobic   Nustep  level 1 seat#9, arm#9 6 minutes while assessing patient progress      Knee/Hip Exercises: Sidelying   Other Sidelying Knee/Hip Exercises  left sidely left knee extension with slide board 20 x    Other Sidelying Knee/Hip Exercises  left knee isometrics for knee flexion and extension with tactile on cues to contract the correct muscle      Modalities   Modalities  Iontophoresis      Iontophoresis   Type of Iontophoresis  Dexamethasone    Location  Left lat knee    Dose  4 mg/ml #3 lot #[id number]    Time  4-6 hour patch      Manual Therapy   Manual Therapy  Soft tissue mobilization    Soft tissue mobilization  gentle massage on lateral patella tendon, lateral left knee where the ITB band by the knee; along the distal lateral hamstring               PT Short Term Goals - 07/10/18 1100      PT SHORT TERM GOAL #1   Title  Pt will be demonstrate knowledge in basic self care strategies for knee pain control     Time  4    Period  Weeks    Status  Achieved        PT Long Term Goals - 06/20/18 1810      PT LONG TERM GOAL #1   Title  The patient will be independent with safe self progression of HEP    Time  8    Period  Weeks    Status  New    Target Date  08/15/18      PT LONG TERM GOAL #2   Title  Pt will be able to report left knee pain </= 3/10 when walking >/= 300 feet on community surfaces using her single point cane.     Time  8    Period  Weeks    Status  New      PT LONG TERM  GOAL #3   Title  The patient will report a 60% reduction in left knee pain with usual ADLs    Time  8    Period  Weeks    Status  New      PT LONG TERM GOAL #4   Title  BERG balance score will improve to 25/56 indicating improved balance with standing and walking     Time  8     Period  Weeks    Status  New            Plan - 07/10/18 1040    Clinical Impression Statement  Patient able to place increased weight on left leg due to decreased pain. Patient is able to fully extend her left knee in sidely with slide board for first time.  In sitting left knee extension needed assistance. Less swelling in lateral left knee but still pain with palpation. Patient will benefit from skilled therapy to reduce her pain  and improve her overall mobility.     Rehab Potential  Good    Clinical Impairments Affecting Rehab Potential  MS, spastic gait, fatigue, risk of falls;  lateral knee hypersensitivity     PT Frequency  2x / week    PT Duration  8 weeks    PT Treatment/Interventions  ADLs/Self Care Home Management;Iontophoresis 4mg /ml Dexamethasone;Ultrasound;Moist Heat;Cryotherapy;Electrical Stimulation;Therapeutic exercise;Therapeutic activities;Gait training;Functional mobility training;Neuromuscular re-education;Patient/family education;Manual techniques;Taping;Vasopneumatic Device    PT Next Visit Plan  continue with ionto  #4 and KT tape.  Patient needs assistance with knee exercises and review of exercises due to memory; weight shifting exercise    PT Home Exercise Plan  Access Code: [email or web address]     Consulted and Agree with Plan of Care  Patient       Patient will benefit from skilled therapeutic intervention in order to improve the following deficits and impairments:  Abnormal gait, Pain, Decreased balance, Decreased strength, Decreased mobility, Postural dysfunction, Decreased activity tolerance, Difficulty walking, Impaired perceived functional ability  Visit Diagnosis: Acute pain of left knee  Difficulty in walking, not elsewhere classified  Other abnormalities of gait and mobility  Other lack of coordination     Problem List Patient Active Problem List   Diagnosis Date Noted  . Excessive daytime sleepiness 10/17/2017  . Vitamin D deficiency 12/09/2016  .  Attention deficit disorder 12/09/2016  . Sjogren's disease ([place]) 05/03/2016  . Other headache syndrome 09/08/2015  . Transverse myelitis ([place]) 05/06/2015  . Dysesthesia 05/06/2015  . Neck pain 05/06/2015  . Occipital neuralgia 05/06/2015  . Spastic gait 02/05/2015  . Urinary frequency 02/05/2015  . Other fatigue 02/05/2015  . Multiple lung nodules on CT 09/24/2013  . Nipple discharge 08/04/2012  . 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 VOCAL CORD DISORDER 10/21/2009  . DYSPHAGIA, OROPHARYNGEAL PHASE 10/21/2009  . PULMONARY NODULE, RIGHT UPPER LOBE 01/22/2009  . DYSPNEA 09/24/2008  . BRONCHITIS, OBSTRUCTIVE CHRONIC, WITH EXACERBATION 09/10/2008  . G E R D 09/10/2008  . MULTIPLE SCLEROSIS 03/13/2008  . MIGRAINE, CHRONIC 03/13/2008  . CHRONIC RHINITIS 03/13/2008  . COUGH 03/13/2008    Earlie Counts, PT 07/10/18 11:02 AM   [facility] [address] [place], [place], [address] Phone: [phone]   Fax:  [phone]  Name: KADEJAH SANDIFORD MRN: [id number] Date of Birth: [birth year]

## 2018-07-12 ENCOUNTER — Ambulatory Visit: Payer: PPO | Admitting: Physical Therapy

## 2018-07-12 ENCOUNTER — Encounter: Payer: Self-pay | Admitting: Physical Therapy

## 2018-07-12 DIAGNOSIS — R278 Other lack of coordination: Secondary | ICD-10-CM

## 2018-07-12 DIAGNOSIS — M25562 Pain in left knee: Secondary | ICD-10-CM | POA: Diagnosis not present

## 2018-07-12 DIAGNOSIS — R262 Difficulty in walking, not elsewhere classified: Secondary | ICD-10-CM

## 2018-07-12 DIAGNOSIS — R2689 Other abnormalities of gait and mobility: Secondary | ICD-10-CM

## 2018-07-12 NOTE — Therapy (Signed)
[facility] [address], [place], [place], [address] Phone: [phone]   Fax:  [phone]  Physical Therapy Treatment  Patient Details  Name: [name] MRN: [id number] Date of Birth: [birth year] Referring Provider: Dr. Joylene Draft   Encounter Date: 07/12/2018  PT End of Session - 07/12/18 1111    Visit Number  5    Number of Visits  13    Date for PT Re-Evaluation  08/15/18    PT Start Time  0109    PT Stop Time  1145    PT Time Calculation (min)  40 min    Activity Tolerance  Patient tolerated treatment well;No increased pain    Behavior During Therapy  WFL for tasks assessed/performed       Past Medical History:  Diagnosis Date  . Arthritis    back  . Dysesthesia    bilateral feet  . Family history of adverse reaction to anesthesia    mother -- ponv  . Fatigue   . Fecal incontinence   . Frequent falls    due to MS  . GERD (gastroesophageal reflux disease)   . Heart murmur   . History of colon polyps    2013;  2015  . 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 Migraines    right side   . Mitral valve prolapse    states no problem  . Multiple sclerosis [facility]) 1989   neurologist-  dr Felecia Shelling  . Neurogenic bladder   . Osteoporosis   . PONV (postoperative nausea and vomiting)   . Pulmonary nodule, right    pulmologist-  dr Milinda Hirschfeld--  stable per ct 05-17-2016  . Short of breath on exertion   . Sjogren's disease ([place])    dryness of eyes, mouth  . Spastic gait   . 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 Wears bilateral ankle braces    AFO braces for stability    Past Surgical History:  Procedure Laterality Date  . ANAL RECTAL MANOMETRY N/A 08/18/2016   Procedure: ANO RECTAL MANOMETRY;  Surgeon: Leighton Ruff, MD;  Location: [facility];  Service: Endoscopy;  Laterality: N/A;  . ANTERIOR CERVICAL DECOMP/DISCECTOMY FUSION  2004;    2003   C4 -- C5 (2004)/   C3 -- C4 (2003)  . APPENDECTOMY  06/22/2011   laparoscopic  . BILATERAL SALPINGOOPHORECTOMY  1982  . BLADDER SUSPENSION  1994   sling  . BREAST ENHANCEMENT SURGERY Bilateral   . BREAST IMPLANT REMOVAL Bilateral 12/03/2008  . BUNIONECTOMY    . CATARACT EXTRACTION W/ INTRAOCULAR LENS  IMPLANT, BILATERAL  2015  . ELBOW SURGERY Right   . HEMORROIDECTOMY  08/05/2010;  2013  . INSERTION SACRAL NERVE STIMULATOR TEST WIRE  09-28-2016   dr Marcello Moores  . INTERCOSTAL NERVE BLOCK  2015   occipital  . RECTAL ULTRASOUND N/A 08/18/2016   Procedure: RECTAL ULTRASOUND;  Surgeon: Leighton Ruff, MD;  [facility];  Service: Endoscopy;  Laterality: N/A;  . SHOULDER ARTHROSCOPY Left   . SHOULDER ARTHROSCOPY WITH DISTAL CLAVICLE RESECTION Right 09/21/2007   w/  Acrominoplasty,  Debridement Rotator Cuff,  CA ligament release  . TONSILLECTOMY  age 67  . TRIGGER FINGER RELEASE Left 01/16/2015   Procedure: LEFT THUMB TRIGGER RELEASE ;  Surgeon: Leanora Cover, MD;  Location: [facility];  Service: Orthopedics;  Laterality: Left;  .  VAGINAL HYSTERECTOMY  1981    There were no vitals filed for this visit.  Subjective Assessment - 07/12/18 1108    Subjective  I feel like it is getting better.  The ionto is helping. If I stand still increases pain.     Pertinent History  MS, s/p cerivcal fusion    Limitations  House hold activities;Walking    How long can you sit comfortably?  unlimited    How long can you walk comfortably?  50 yards;  uses 2 canes at times    Diagnostic tests  xrays of knee after accident     Patient Stated Goals  I want my balance to be better;  make core stronger;  make me less fatigue;  Keep going!    Currently in Pain?  Yes    Pain Score  3     Pain Orientation  Left    Pain Descriptors / Indicators  Aching;Sore    Pain Type  Acute pain    Pain Onset  More than a month ago    Pain Frequency  Intermittent    Aggravating Factors   twist knee a  certain way; being on it; hyperextending it; up and down stairs (husband assist)    Pain Relieving Factors  mobilization to correct leg length    Multiple Pain Sites  No         [facility] PT Assessment - 07/12/18 0001      Strength   Right Knee Flexion  2+/5    Right Knee Extension  2+/5    Left Knee Flexion  2+/5    Left Knee Extension  2+/5                   [facility] Adult PT Treatment/Exercise - 07/12/18 0001      Knee/Hip Exercises: Standing   Other Standing Knee Exercises  weight shift side to side holding onto treadmill bar; TKE on left with tactile cues to left quads and slight resistance to extension;     Other Standing Knee Exercises  standing with bringing right leg out on slider and slightly bend the left knee      Knee/Hip Exercises: Sidelying   Other Sidelying Knee/Hip Exercises  left sidely left knee extension with slide board 20 x; knee isometrics for flexion and extension    Other Sidelying Knee/Hip Exercises  left ankle DF/PF with A/AROM due to not knowing if she is moving her ankle      Iontophoresis   Type of Iontophoresis  Dexamethasone    Location  Left lat knee    Dose  4 mg/ml #4 lot #[id number]    Time  4-6 hour patch               PT Short Term Goals - 07/10/18 1100      PT SHORT TERM GOAL #1   Title  Pt will be demonstrate knowledge in basic self care strategies for knee pain control     Time  4    Period  Weeks    Status  Achieved        PT Long Term Goals - 06/20/18 1810      PT LONG TERM GOAL #1   Title  The patient will be independent with safe self progression of HEP    Time  8    Period  Weeks    Status  New    Target Date  08/15/18      PT  LONG TERM GOAL #2   Title  Pt will be able to report left knee pain </= 3/10 when walking >/= 300 feet on community surfaces using her single point cane.     Time  8    Period  Weeks    Status  New      PT LONG TERM GOAL #3   Title  The patient will report a 60% reduction in  left knee pain with usual ADLs    Time  8    Period  Weeks    Status  New      PT LONG TERM GOAL #4   Title  BERG balance score will improve to 25/56 indicating improved balance with standing and walking     Time  8    Period  Weeks    Status  New            Plan - 07/12/18 1142    Clinical Impression Statement  Patient is able to give resistance to left knee for flexion and extension better than last visit.  Patient was able to exercise the left knee and work on balance with greater ease and less pain.  Patient was able to walk after therapy with improve weightbear on the left.  Patient strength of left knee is the same but has more control.  Patient will benefit from skilled therapy to reduce her pain and improve her overall mobility.     Rehab Potential  Good    Clinical Impairments Affecting Rehab Potential  MS, spastic gait, fatigue, risk of falls;  lateral knee hypersensitivity     PT Frequency  2x / week    PT Duration  8 weeks    PT Treatment/Interventions  ADLs/Self Care Home Management;Iontophoresis 4mg /ml Dexamethasone;Ultrasound;Moist Heat;Cryotherapy;Electrical Stimulation;Therapeutic exercise;Therapeutic activities;Gait training;Functional mobility training;Neuromuscular re-education;Patient/family education;Manual techniques;Taping;Vasopneumatic Device    PT Next Visit Plan  continue with ionto  #5 and KT tape.  Patient needs assistance with knee exercises and review of exercises due to memory; weight shifting exercise    PT Home Exercise Plan  Access Code: [email or web address]     Consulted and Agree with Plan of Care  Patient       Patient will benefit from skilled therapeutic intervention in order to improve the following deficits and impairments:  Abnormal gait, Pain, Decreased balance, Decreased strength, Decreased mobility, Postural dysfunction, Decreased activity tolerance, Difficulty walking, Impaired perceived functional ability  Visit Diagnosis: Acute pain of left  knee  Difficulty in walking, not elsewhere classified  Other abnormalities of gait and mobility  Other lack of coordination     Problem List Patient Active Problem List   Diagnosis Date Noted  . Excessive daytime sleepiness 10/17/2017  . Vitamin D deficiency 12/09/2016  . Attention deficit disorder 12/09/2016  . Sjogren's disease ([place]) 05/03/2016  . Other headache syndrome 09/08/2015  . Transverse myelitis ([place]) 05/06/2015  . Dysesthesia 05/06/2015  . Neck pain 05/06/2015  . Occipital neuralgia 05/06/2015  . Spastic gait 02/05/2015  . Urinary frequency 02/05/2015  . Other fatigue 02/05/2015  . Multiple lung nodules on CT 09/24/2013  . Nipple discharge 08/04/2012  . 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 VOCAL CORD DISORDER 10/21/2009  . DYSPHAGIA, OROPHARYNGEAL PHASE 10/21/2009  . PULMONARY NODULE, RIGHT UPPER LOBE 01/22/2009  . DYSPNEA 09/24/2008  . BRONCHITIS, OBSTRUCTIVE CHRONIC, WITH EXACERBATION 09/10/2008  . G E R D 09/10/2008  . MULTIPLE SCLEROSIS 03/13/2008  . MIGRAINE, CHRONIC 03/13/2008  . CHRONIC RHINITIS 03/13/2008  . COUGH 03/13/2008  Earlie Counts, PT 07/12/18 11:46 AM   [facility] [address], [place], [place], [address] Phone: [phone]   Fax:  [phone]  Name: MYKHIA DANISH MRN: [id number] Date of Birth: 1951/10/24

## 2018-07-14 ENCOUNTER — Other Ambulatory Visit (HOSPITAL_COMMUNITY): Payer: Self-pay | Admitting: *Deleted

## 2018-07-17 ENCOUNTER — Ambulatory Visit (HOSPITAL_COMMUNITY)
Admission: RE | Admit: 2018-07-17 | Discharge: 2018-07-17 | Disposition: A | Discharge status: Home / Self Care | Payer: PPO | Source: Ambulatory Visit | Attending: Internal Medicine | Admitting: Internal Medicine

## 2018-07-17 DIAGNOSIS — M810 Age-related osteoporosis without current pathological fracture: Secondary | ICD-10-CM | POA: Insufficient documentation

## 2018-07-17 MED ORDER — DENOSUMAB 60 MG/ML ~~LOC~~ SOSY
60.0000 mg | PREFILLED_SYRINGE | Freq: Once | SUBCUTANEOUS | Status: AC
  Administered 2018-07-17: 60 mg via SUBCUTANEOUS
  Filled 2018-07-17: qty 1

## 2018-07-17 NOTE — Progress Notes (Signed)
tysabri allergey called to Dr Joylene Draft as her prolia is flagged because of it. He states it is ok to use the prolia.

## 2018-07-18 ENCOUNTER — Ambulatory Visit: Payer: PPO | Admitting: Physical Therapy

## 2018-07-18 ENCOUNTER — Encounter: Payer: Self-pay | Admitting: Physical Therapy

## 2018-07-18 DIAGNOSIS — R278 Other lack of coordination: Secondary | ICD-10-CM

## 2018-07-18 DIAGNOSIS — M25562 Pain in left knee: Secondary | ICD-10-CM

## 2018-07-18 DIAGNOSIS — R262 Difficulty in walking, not elsewhere classified: Secondary | ICD-10-CM

## 2018-07-18 DIAGNOSIS — R2689 Other abnormalities of gait and mobility: Secondary | ICD-10-CM

## 2018-07-18 NOTE — Therapy (Signed)
[facility] [address], [place], [place], [address] Phone: [phone]   Fax:  [phone]  Physical Therapy Treatment  Patient Details  Name: [name] MRN: [id number] Date of Birth: 11-Jan-1951 Referring Provider: Dr. Joylene Draft   Encounter Date: 07/18/2018  PT End of Session - 07/18/18 1107    Visit Number  6    Number of Visits  13    Date for PT Re-Evaluation  08/15/18    PT Start Time  0932    PT Stop Time  7741    PT Time Calculation (min)  43 min    Activity Tolerance  Patient tolerated treatment well;No increased pain       Past Medical History:  Diagnosis Date  . Arthritis    back  . Dysesthesia    bilateral feet  . Family history of adverse reaction to anesthesia    mother -- ponv  . Fatigue   . Fecal incontinence   . Frequent falls    due to MS  . GERD (gastroesophageal reflux disease)   . Heart murmur   . History of colon polyps    2013;  2015  . 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 Migraines    right side   . Mitral valve prolapse    states no problem  . Multiple sclerosis [facility]) 1989   neurologist-  dr Felecia Shelling  . Neurogenic bladder   . Osteoporosis   . PONV (postoperative nausea and vomiting)   . Pulmonary nodule, right    pulmologist-  dr Milinda Hirschfeld--  stable per ct 05-17-2016  . Short of breath on exertion   . Sjogren's disease ([place])    dryness of eyes, mouth  . Spastic gait   . 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 Wears bilateral ankle braces    AFO braces for stability    Past Surgical History:  Procedure Laterality Date  . ANAL RECTAL MANOMETRY N/A 08/18/2016   Procedure: ANO RECTAL MANOMETRY;  Surgeon: Leighton Ruff, MD;  [facility];  Service: Endoscopy;  Laterality: N/A;  . ANTERIOR CERVICAL DECOMP/DISCECTOMY FUSION  2004;   2003   C4 -- C5 (2004)/   C3 -- C4 (2003)  . APPENDECTOMY   06/22/2011   laparoscopic  . BILATERAL SALPINGOOPHORECTOMY  1982  . BLADDER SUSPENSION  1994   sling  . BREAST ENHANCEMENT SURGERY Bilateral   . BREAST IMPLANT REMOVAL Bilateral 12/03/2008  . BUNIONECTOMY    . CATARACT EXTRACTION W/ INTRAOCULAR LENS  IMPLANT, BILATERAL  2015  . ELBOW SURGERY Right   . HEMORROIDECTOMY  08/05/2010;  2013  . INSERTION SACRAL NERVE STIMULATOR TEST WIRE  09-28-2016   dr Marcello Moores  . INTERCOSTAL NERVE BLOCK  2015   occipital  . RECTAL ULTRASOUND N/A 08/18/2016   Procedure: RECTAL ULTRASOUND;  Surgeon: Leighton Ruff, MD;  [facility];  Service: Endoscopy;  Laterality: N/A;  . SHOULDER ARTHROSCOPY Left   . SHOULDER ARTHROSCOPY WITH DISTAL CLAVICLE RESECTION Right 09/21/2007   w/  Acrominoplasty,  Debridement Rotator Cuff,  CA ligament release  . TONSILLECTOMY  age 24  . TRIGGER FINGER RELEASE Left 01/16/2015   Procedure: LEFT THUMB TRIGGER RELEASE ;  Surgeon: Leanora Cover, MD;  Location: [facility];  Service: Orthopedics;  Laterality: Left;  Marland Kitchen VAGINAL HYSTERECTOMY  1981    There were no vitals  filed for this visit.  Subjective Assessment - 07/18/18 0938    Subjective  My knee is still very sensitive.  I liked the sliding board.   Stinging pain. I'm sore when I leave here but OK after a couple days.      Currently in Pain?  Yes    Pain Score  4     Pain Location  Knee    Pain Orientation  Left    Pain Type  Acute pain    Pain Onset  More than a month ago    Aggravating Factors   twist knee a certain way;  touching lateral knee                       [facility] Adult PT Treatment/Exercise - 07/18/18 0001      Knee/Hip Exercises: Aerobic   Nustep  level 1 seat#9, arm#9 6 minutes while assessing patient progress      Knee/Hip Exercises: Seated   Heel Slides  AROM;Left;10 reps slide board with towel     Other Seated Knee/Hip Exercises  rocker board 20x       Knee/Hip Exercises: Supine   Other Supine Knee/Hip Exercises   long sitting on mat: slide board hip abduction/adduction 15x needs min assist with fatigue       Knee/Hip Exercises: Sidelying   Other Sidelying Knee/Hip Exercises  left sidely left knee extension with slide board 20 x      Iontophoresis   Type of Iontophoresis  Dexamethasone    Location  Left lat knee    Dose  4 mg/ml #5 lot #[id number]    Time  4-6 hour patch               PT Short Term Goals - 07/18/18 1111      PT SHORT TERM GOAL #1   Title  Pt will be demonstrate knowledge in basic self care strategies for knee pain control     Status  Achieved      PT SHORT TERM GOAL #2   Title  Left knee pain improved by 30% with basic ADLs    Time  4    Period  Weeks    Status  On-going      PT SHORT TERM GOAL #3   Title  The patient will have BERG balance score of 20/56 indicating improved safety and balance with standing and walking    Time  4    Period  Weeks    Status  On-going        PT Long Term Goals - 06/20/18 1810      PT LONG TERM GOAL #1   Title  The patient will be independent with safe self progression of HEP    Time  8    Period  Weeks    Status  New    Target Date  08/15/18      PT LONG TERM GOAL #2   Title  Pt will be able to report left knee pain </= 3/10 when walking >/= 300 feet on community surfaces using her single point cane.     Time  8    Period  Weeks    Status  New      PT LONG TERM GOAL #3   Title  The patient will report a 60% reduction in left knee pain with usual ADLs    Time  8    Period  Weeks    Status  New      PT LONG TERM GOAL #4   Title  BERG balance score will improve to 25/56 indicating improved balance with standing and walking     Time  8    Period  Weeks    Status  New            Plan - 07/18/18 1107    Clinical Impression Statement  The patient reports that although her muscles fatigue quickly, she likes using the sliding board for LE ROM.  She is able to flex and extend the knee and abduct/adduct the hip  herself or with less than 10% assist from therapist.  Good response from iontophoresis.  Therapist closely monitoring for excessive fatigue and providing supervision for safety due to unsteadiness.  Slower to meet goals secondary to progressive neurological disorder.      Rehab Potential  Good    Clinical Impairments Affecting Rehab Potential  MS, spastic gait, fatigue, risk of falls;  lateral knee hypersensitivity     PT Frequency  2x / week    PT Duration  8 weeks    PT Treatment/Interventions  ADLs/Self Care Home Management;Iontophoresis 4mg /ml Dexamethasone;Ultrasound;Moist Heat;Cryotherapy;Electrical Stimulation;Therapeutic exercise;Therapeutic activities;Gait training;Functional mobility training;Neuromuscular re-education;Patient/family education;Manual techniques;Taping;Vasopneumatic Device    PT Next Visit Plan  continue with ionto;   recheck BERG next visit for STG;  % improvement of knee pain for STG;  Patient needs assistance with knee exercises and review of exercises due to memory; weight shifting exercise    PT Home Exercise Plan  Access Code: [email or web address]        Patient will benefit from skilled therapeutic intervention in order to improve the following deficits and impairments:  Abnormal gait, Pain, Decreased balance, Decreased strength, Decreased mobility, Postural dysfunction, Decreased activity tolerance, Difficulty walking, Impaired perceived functional ability  Visit Diagnosis: Acute pain of left knee  Difficulty in walking, not elsewhere classified  Other abnormalities of gait and mobility  Other lack of coordination     Problem List Patient Active Problem List   Diagnosis Date Noted  . Excessive daytime sleepiness 10/17/2017  . Vitamin D deficiency 12/09/2016  . Attention deficit disorder 12/09/2016  . Sjogren's disease ([place]) 05/03/2016  . Other headache syndrome 09/08/2015  . Transverse myelitis ([place]) 05/06/2015  . Dysesthesia 05/06/2015  . Neck pain 05/06/2015   . Occipital neuralgia 05/06/2015  . Spastic gait 02/05/2015  . Urinary frequency 02/05/2015  . Other fatigue 02/05/2015  . Multiple lung nodules on CT 09/24/2013  . Nipple discharge 08/04/2012  . 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 VOCAL CORD DISORDER 10/21/2009  . DYSPHAGIA, OROPHARYNGEAL PHASE 10/21/2009  . PULMONARY NODULE, RIGHT UPPER LOBE 01/22/2009  . DYSPNEA 09/24/2008  . BRONCHITIS, OBSTRUCTIVE CHRONIC, WITH EXACERBATION 09/10/2008  . G E R D 09/10/2008  . MULTIPLE SCLEROSIS 03/13/2008  . MIGRAINE, CHRONIC 03/13/2008  . CHRONIC RHINITIS 03/13/2008  . COUGH 03/13/2008   Ruben Im, PT 07/18/18 11:16 AM Phone: [phone] Fax: [phone]  Alvera Singh 07/18/2018, 11:16 AM  [facility] [address] [place], [place], [address] Phone: [phone]   Fax:  [phone]  Name: [name] MRN: [id number] Date of Birth: February 15, 1951

## 2018-07-20 DIAGNOSIS — L718 Other rosacea: Secondary | ICD-10-CM | POA: Diagnosis not present

## 2018-07-20 DIAGNOSIS — L821 Other seborrheic keratosis: Secondary | ICD-10-CM | POA: Diagnosis not present

## 2018-07-20 DIAGNOSIS — Z85828 Personal history of other malignant neoplasm of skin: Secondary | ICD-10-CM | POA: Diagnosis not present

## 2018-07-20 DIAGNOSIS — L820 Inflamed seborrheic keratosis: Secondary | ICD-10-CM | POA: Diagnosis not present

## 2018-07-20 DIAGNOSIS — D171 Benign lipomatous neoplasm of skin and subcutaneous tissue of trunk: Secondary | ICD-10-CM | POA: Diagnosis not present

## 2018-07-21 ENCOUNTER — Ambulatory Visit: Payer: PPO | Admitting: Physical Therapy

## 2018-07-21 ENCOUNTER — Encounter: Payer: Self-pay | Admitting: Physical Therapy

## 2018-07-21 DIAGNOSIS — M25562 Pain in left knee: Secondary | ICD-10-CM

## 2018-07-21 DIAGNOSIS — R262 Difficulty in walking, not elsewhere classified: Secondary | ICD-10-CM

## 2018-07-21 DIAGNOSIS — R278 Other lack of coordination: Secondary | ICD-10-CM

## 2018-07-21 DIAGNOSIS — R2689 Other abnormalities of gait and mobility: Secondary | ICD-10-CM

## 2018-07-21 NOTE — Therapy (Signed)
[facility] [address] [place], [place], [address] Phone: [phone]   Fax:  [phone]  Physical Therapy Treatment  Patient Details  Name: [name] MRN: [id number] Date of Birth: 1951/03/12 Referring Provider: Dr. Joylene Draft   Encounter Date: 07/21/2018  PT End of Session - 07/21/18 0937    Visit Number  7    Number of Visits  13    Date for PT Re-Evaluation  08/15/18    PT Start Time  0936    PT Stop Time  1572    PT Time Calculation (min)  39 min    Activity Tolerance  Patient tolerated treatment well;No increased pain    Behavior During Therapy  WFL for tasks assessed/performed       Past Medical History:  Diagnosis Date  . Arthritis    back  . Dysesthesia    bilateral feet  . Family history of adverse reaction to anesthesia    mother -- ponv  . Fatigue   . Fecal incontinence   . Frequent falls    due to MS  . GERD (gastroesophageal reflux disease)   . Heart murmur   . History of colon polyps    2013;  2015  . 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 Migraines    right side   . Mitral valve prolapse    states no problem  . Multiple sclerosis [facility]) 1989   neurologist-  dr Felecia Shelling  . Neurogenic bladder   . Osteoporosis   . PONV (postoperative nausea and vomiting)   . Pulmonary nodule, right    pulmologist-  dr Milinda Hirschfeld--  stable per ct 05-17-2016  . Short of breath on exertion   . Sjogren's disease ([place])    dryness of eyes, mouth  . Spastic gait   . 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 Wears bilateral ankle braces    AFO braces for stability    Past Surgical History:  Procedure Laterality Date  . ANAL RECTAL MANOMETRY N/A 08/18/2016   Procedure: ANO RECTAL MANOMETRY;  Surgeon: Leighton Ruff, MD;  [facility]: [facility];  Service: Endoscopy;  Laterality: N/A;  . ANTERIOR CERVICAL DECOMP/DISCECTOMY FUSION  2004;    2003   C4 -- C5 (2004)/   C3 -- C4 (2003)  . APPENDECTOMY  06/22/2011   laparoscopic  . BILATERAL SALPINGOOPHORECTOMY  1982  . BLADDER SUSPENSION  1994   sling  . BREAST ENHANCEMENT SURGERY Bilateral   . BREAST IMPLANT REMOVAL Bilateral 12/03/2008  . BUNIONECTOMY    . CATARACT EXTRACTION W/ INTRAOCULAR LENS  IMPLANT, BILATERAL  2015  . ELBOW SURGERY Right   . HEMORROIDECTOMY  08/05/2010;  2013  . INSERTION SACRAL NERVE STIMULATOR TEST WIRE  09-28-2016   dr Marcello Moores  . INTERCOSTAL NERVE BLOCK  2015   occipital  . RECTAL ULTRASOUND N/A 08/18/2016   Procedure: RECTAL ULTRASOUND;  Surgeon: Leighton Ruff, MD;  [facility];  Service: Endoscopy;  Laterality: N/A;  . SHOULDER ARTHROSCOPY Left   . SHOULDER ARTHROSCOPY WITH DISTAL CLAVICLE RESECTION Right 09/21/2007   w/  Acrominoplasty,  Debridement Rotator Cuff,  CA ligament release  . TONSILLECTOMY  age 51  . TRIGGER FINGER RELEASE Left 01/16/2015   Procedure: LEFT THUMB TRIGGER RELEASE ;  Surgeon: Leanora Cover, MD;  Location: [facility];  Service: Orthopedics;  Laterality: Left;  .  VAGINAL HYSTERECTOMY  1981    There were no vitals filed for this visit.  Subjective Assessment - 07/21/18 0938    Subjective  Patient reports her left knee pain is 25% better. Patient is able to have slight pressure on left lateral knee. Patient reports less crackling noise in left knee.                        [place] Adult PT Treatment/Exercise - 07/21/18 0001      Knee/Hip Exercises: Aerobic   Nustep  level 1 seat#9, arm#9 6 minutes while assessing patient progress      Knee/Hip Exercises: Supine   Heel Slides  AAROM;Strengthening;Left;1 set;10 reps moderate assistance    Other Supine Knee/Hip Exercises  long sitting on mat: slide board hip abduction/adduction 15x needs min assist with fatigue       Knee/Hip Exercises: Sidelying   Other Sidelying Knee/Hip Exercises  left sidely left knee extension with slide board  20 x      Iontophoresis   Type of Iontophoresis  Dexamethasone    Location  Left lat knee    Dose  4 mg/ml #6 lot #[id number]    Time  4-6 hour patch               PT Short Term Goals - 07/21/18 1159      PT SHORT TERM GOAL #2   Title  Left knee pain improved by 30% with basic ADLs    Baseline  25%    Time  4    Period  Weeks    Status  On-going        PT Long Term Goals - 06/20/18 1810      PT LONG TERM GOAL #1   Title  The patient will be independent with safe self progression of HEP    Time  8    Period  Weeks    Status  New    Target Date  08/15/18      PT LONG TERM GOAL #2   Title  Pt will be able to report left knee pain </= 3/10 when walking >/= 300 feet on community surfaces using her single point cane.     Time  8    Period  Weeks    Status  New      PT LONG TERM GOAL #3   Title  The patient will report a 60% reduction in left knee pain with usual ADLs    Time  8    Period  Weeks    Status  New      PT LONG TERM GOAL #4   Title  BERG balance score will improve to 25/56 indicating improved balance with standing and walking     Time  8    Period  Weeks    Status  New            Plan - 07/21/18 4081    Clinical Impression Statement  Patient is now able to fully extend her left knee in sidely. Patient needs assistance with heel slides due to decreased control of left knee and ankle position.  Patient reports her pain is 25% better.  Patient is progressing slowly.  Patient will benefit from skilled therapy to reduce her pain and improve her balance.     Rehab Potential  Good    Clinical Impairments Affecting Rehab Potential  MS, spastic gait, fatigue, risk of falls;  lateral knee hypersensitivity  PT Frequency  2x / week    PT Duration  8 weeks    PT Treatment/Interventions  ADLs/Self Care Home Management;Iontophoresis 4mg /ml Dexamethasone;Ultrasound;Moist Heat;Cryotherapy;Electrical Stimulation;Therapeutic exercise;Therapeutic activities;Gait  training;Functional mobility training;Neuromuscular re-education;Patient/family education;Manual techniques;Taping;Vasopneumatic Device    PT Next Visit Plan   recheck BERG next visit for STG;  ;  Patient needs assistance with knee exercises and review of exercises due to memory; weight shifting exercise    PT Home Exercise Plan  Access Code: [email or web address]     Consulted and Agree with Plan of Care  Patient       Patient will benefit from skilled therapeutic intervention in order to improve the following deficits and impairments:  Abnormal gait, Pain, Decreased balance, Decreased strength, Decreased mobility, Postural dysfunction, Decreased activity tolerance, Difficulty walking, Impaired perceived functional ability  Visit Diagnosis: Acute pain of left knee  Difficulty in walking, not elsewhere classified  Other abnormalities of gait and mobility  Other lack of coordination     Problem List Patient Active Problem List   Diagnosis Date Noted  . Excessive daytime sleepiness 10/17/2017  . Vitamin D deficiency 12/09/2016  . Attention deficit disorder 12/09/2016  . Sjogren's disease ([place]) 05/03/2016  . Other headache syndrome 09/08/2015  . Transverse myelitis ([place]) 05/06/2015  . Dysesthesia 05/06/2015  . Neck pain 05/06/2015  . Occipital neuralgia 05/06/2015  . Spastic gait 02/05/2015  . Urinary frequency 02/05/2015  . Other fatigue 02/05/2015  . Multiple lung nodules on CT 09/24/2013  . Nipple discharge 08/04/2012  . 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 VOCAL CORD DISORDER 10/21/2009  . DYSPHAGIA, OROPHARYNGEAL PHASE 10/21/2009  . PULMONARY NODULE, RIGHT UPPER LOBE 01/22/2009  . DYSPNEA 09/24/2008  . BRONCHITIS, OBSTRUCTIVE CHRONIC, WITH EXACERBATION 09/10/2008  . G E R D 09/10/2008  . MULTIPLE SCLEROSIS 03/13/2008  . MIGRAINE, CHRONIC 03/13/2008  . CHRONIC RHINITIS 03/13/2008  . COUGH 03/13/2008    Earlie Counts, PT 07/21/18 12:00 PM   [facility] [address], [place], [place], [address] Phone: [phone]   Fax:  [phone]  Name: [name] MRN: [id number] Date of Birth: 08/21/1951

## 2018-07-24 ENCOUNTER — Ambulatory Visit: Payer: PPO | Admitting: Physical Therapy

## 2018-07-24 ENCOUNTER — Other Ambulatory Visit: Payer: Self-pay | Admitting: Neurology

## 2018-07-24 DIAGNOSIS — G35 Multiple sclerosis: Secondary | ICD-10-CM | POA: Diagnosis not present

## 2018-07-24 DIAGNOSIS — R2689 Other abnormalities of gait and mobility: Secondary | ICD-10-CM

## 2018-07-24 DIAGNOSIS — M25562 Pain in left knee: Secondary | ICD-10-CM

## 2018-07-24 DIAGNOSIS — R278 Other lack of coordination: Secondary | ICD-10-CM

## 2018-07-24 DIAGNOSIS — R262 Difficulty in walking, not elsewhere classified: Secondary | ICD-10-CM

## 2018-07-24 DIAGNOSIS — Z6820 Body mass index (BMI) 20.0-20.9, adult: Secondary | ICD-10-CM | POA: Diagnosis not present

## 2018-07-24 MED ORDER — AMPHETAMINE-DEXTROAMPHET ER 25 MG PO CP24: 25.0000 mg | ORAL_CAPSULE | ORAL | 0 refills | Status: DC

## 2018-07-24 NOTE — Therapy (Signed)
[facility] [address], [place], [place], [address] Phone: [phone]   Fax:  [phone]  Physical Therapy Treatment  Patient Details  Name: [name] MRN: [id number] Date of Birth: 1951/09/17 Referring Provider: Dr. Crist Infante   Encounter Date: 07/24/2018  PT End of Session - 07/24/18 1020    Visit Number  8    Number of Visits  13    Date for PT Re-Evaluation  08/15/18    Authorization Type  Health team    PT Start Time  1020 came late    PT Stop Time  1110    PT Time Calculation (min)  50 min    Activity Tolerance  Patient tolerated treatment well;No increased pain    Behavior During Therapy  WFL for tasks assessed/performed       Past Medical History:  Diagnosis Date  . Arthritis    back  . Dysesthesia    bilateral feet  . Family history of adverse reaction to anesthesia    mother -- ponv  . Fatigue   . Fecal incontinence   . Frequent falls    due to MS  . GERD (gastroesophageal reflux disease)   . Heart murmur   . History of colon polyps    2013;  2015  . 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 Migraines    right side   . Mitral valve prolapse    states no problem  . Multiple sclerosis [facility]) 1989   neurologist-  dr Felecia Shelling  . Neurogenic bladder   . Osteoporosis   . PONV (postoperative nausea and vomiting)   . Pulmonary nodule, right    pulmologist-  dr Milinda Hirschfeld--  stable per ct 05-17-2016  . Short of breath on exertion   . Sjogren's disease ([place])    dryness of eyes, mouth  . Spastic gait   . 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 Wears bilateral ankle braces    AFO braces for stability    Past Surgical History:  Procedure Laterality Date  . ANAL RECTAL MANOMETRY N/A 08/18/2016   Procedure: ANO RECTAL MANOMETRY;  Surgeon: Leighton Ruff, MD;  [facility];  Service: Endoscopy;  Laterality: N/A;  .  ANTERIOR CERVICAL DECOMP/DISCECTOMY FUSION  2004;   2003   C4 -- C5 (2004)/   C3 -- C4 (2003)  . APPENDECTOMY  06/22/2011   laparoscopic  . BILATERAL SALPINGOOPHORECTOMY  1982  . BLADDER SUSPENSION  1994   sling  . BREAST ENHANCEMENT SURGERY Bilateral   . BREAST IMPLANT REMOVAL Bilateral 12/03/2008  . BUNIONECTOMY    . CATARACT EXTRACTION W/ INTRAOCULAR LENS  IMPLANT, BILATERAL  2015  . ELBOW SURGERY Right   . HEMORROIDECTOMY  08/05/2010;  2013  . INSERTION SACRAL NERVE STIMULATOR TEST WIRE  09-28-2016   dr Marcello Moores  . INTERCOSTAL NERVE BLOCK  2015   occipital  . RECTAL ULTRASOUND N/A 08/18/2016   Procedure: RECTAL ULTRASOUND;  Surgeon: Leighton Ruff, MD;  [facility];  Service: Endoscopy;  Laterality: N/A;  . SHOULDER ARTHROSCOPY Left   . SHOULDER ARTHROSCOPY WITH DISTAL CLAVICLE RESECTION Right 09/21/2007   w/  Acrominoplasty,  Debridement Rotator Cuff,  CA ligament release  . TONSILLECTOMY  age 58  . TRIGGER FINGER RELEASE Left 01/16/2015   Procedure: LEFT THUMB TRIGGER RELEASE ;  Surgeon: Leanora Cover, MD;  Location: [facility];  Service: Orthopedics;  Laterality: Left;  [place] VAGINAL HYSTERECTOMY  67    There were no vitals filed for this visit.  Subjective Assessment - 07/24/18 1020    Subjective  I called the doctor and they are going to try to get me in today. I had a painful weekend but today is a little better.     Pertinent History  MS, s/p cerivcal fusion    Limitations  House hold activities;Walking    How long can you sit comfortably?  unlimited    How long can you walk comfortably?  50 yards;  uses 2 canes at times    Diagnostic tests  xrays of knee after accident     Patient Stated Goals  I want my balance to be better;  make core stronger;  make me less fatigue;  Keep going!    Currently in Pain?  Yes    Pain Score  3     Pain Location  Knee    Pain Orientation  Left    Pain Descriptors / Indicators  Aching;Sore    Pain Type  Acute pain     Pain Onset  More than a month ago    Pain Frequency  Intermittent    Aggravating Factors   twist knee a certain way; touching lateral knee    Pain Relieving Factors  mobilization to correct leg length    Multiple Pain Sites  No         [facility] PT Assessment - 07/24/18 0001      Assessment   Medical Diagnosis  gait imbalance     Referring Provider  Dr. Crist Infante    Onset Date/Surgical Date  03/24/18    Next MD Visit  as needed    Prior Therapy  yes in past for MS and [place] for knee pain       Precautions   Precautions  Fall      Restrictions   Weight Bearing Restrictions  No      Home Film/video editor residence    Living Arrangements  Spouse/significant other    Type of [place] Access  Level entry    Home Layout  One level    [place] - 2 wheels;Cane - single point;Wheelchair - Education officer, community - power      Prior Function   Level of Independence  Independent with household mobility with device    Leisure  [employer], swim, spend time with family  pool right across the street, treadmill      AROM   Overall AROM Comments  unable to fully extend right or left knee actively in sitting active ankle DF to neutral only bil    Left Knee Extension  -- siting -25 degrees    Left Knee Flexion  -- sidely 70 degrees      Strength   Right Hip Flexion  3/5    Left Ankle Dorsiflexion  3-/5    Left Ankle Plantar Flexion  3-/5    Left Ankle Inversion  3-/5    Left Ankle Eversion  3-/5      Palpation   Patella mobility  puffiness around left fat pad and lateral left knee       Berg Balance Test   Sit to Stand  Able to stand  independently using hands    Standing Unsupported  Able to stand 2 minutes with supervision  Sitting with Back Unsupported but Feet Supported on Floor or Stool  Able to sit safely and securely 2 minutes    Stand to Sit  Uses backs of legs against chair to control descent    Transfers  Able to  transfer safely, definite need of hands    Standing Unsupported with Eyes Closed  Able to stand 10 seconds with supervision    Standing Ubsupported with Feet Together  Able to place feet together independently but unable to hold for 30 seconds    From Standing, Reach Forward with Outstretched Arm  Reaches forward but needs supervision    From Standing Position, Pick up Object from Floor  Unable to try/needs assist to keep balance    From Standing Position, Turn to Look Behind Over each Shoulder  Turn sideways only but maintains balance    Turn 360 Degrees  Needs assistance while turning    Standing Unsupported, Alternately Place Feet on Step/Stool  Needs assistance to keep from falling or unable to try    Standing Unsupported, One Foot in Front  Needs help to step but can hold 15 seconds    Standing on One Leg  Unable to try or needs assist to prevent fall    Total Score  24                   [facility] Adult PT Treatment/Exercise - 07/24/18 0001      Knee/Hip Exercises: Seated   Long Arc Quad  AAROM;Strengthening;Left;1 set;15 reps    Other Seated Knee/Hip Exercises  rocker board 20x  assiting for movement      Knee/Hip Exercises: Supine   Short Arc Quad Sets  AAROM;Strengthening;Left;1 set;15 reps using white bolster    Heel Slides  AAROM;Strengthening;Left;1 set;10 reps moderate assistance               PT Short Term Goals - 07/24/18 1123      PT SHORT TERM GOAL #2   Title  Left knee pain improved by 30% with basic ADLs    Baseline  25%    Time  4    Period  Weeks    Status  On-going      PT SHORT TERM GOAL #3   Title  The patient will have BERG balance score of 20/56 indicating improved safety and balance with standing and walking    Time  4    Period  Weeks    Status  Achieved        PT Long Term Goals - 06/20/18 1810      PT LONG TERM GOAL #1   Title  The patient will be independent with safe self progression of HEP    Time  8    Period  Weeks     Status  New    Target Date  08/15/18      PT LONG TERM GOAL #2   Title  Pt will be able to report left knee pain </= 3/10 when walking >/= 300 feet on community surfaces using her single point cane.     Time  8    Period  Weeks    Status  New      PT LONG TERM GOAL #3   Title  The patient will report a 60% reduction in left knee pain with usual ADLs    Time  8    Period  Weeks    Status  New      PT LONG  TERM GOAL #4   Title  BERG balance score will improve to 25/56 indicating improved balance with standing and walking     Time  8    Period  Weeks    Status  New            Plan - 07/24/18 1123    Clinical Impression Statement  Patient has difficulty with coordination due to her MS. Patient still needs to walk with her cane and increased pain in left knee. Patient has met her STG's. Patient has improved Berg balance score to 24/56 compared to 15/56. Patient has increased left knee pain with standing balance activities.  Patient is able to extend her left knee to -25 degrees in sitting.  In sidely she is able to flex her left knee to 70 degrees and fully extend her left knee.  Patient is able to fully dorsiflex and plantarflex her left ankle in left sidely.  Patient continues to have puffiness in the lateral left knee and increased sensitivity.  Patient pain will vary but is still significant.  Patient will benefit from skilled therapy to reduce her pain and improve her balance.     Rehab Potential  Good    Clinical Impairments Affecting Rehab Potential  MS, spastic gait, fatigue, risk of falls;  lateral knee hypersensitivity     PT Frequency  2x / week    PT Duration  8 weeks    PT Treatment/Interventions  ADLs/Self Care Home Management;Iontophoresis 57m/ml Dexamethasone;Ultrasound;Moist Heat;Cryotherapy;Electrical Stimulation;Therapeutic exercise;Therapeutic activities;Gait training;Functional mobility training;Neuromuscular re-education;Patient/family education;Manual  techniques;Taping;Vasopneumatic Device    PT Next Visit Plan   see what MD says.   Patient needs assistance with knee exercises and review of exercises due to memory; weight shifting exercise    PT Home Exercise Plan  Access Code: [email or web address]    Consulted and Agree with Plan of Care  Patient       Patient will benefit from skilled therapeutic intervention in order to improve the following deficits and impairments:  Abnormal gait, Pain, Decreased balance, Decreased strength, Decreased mobility, Postural dysfunction, Decreased activity tolerance, Difficulty walking, Impaired perceived functional ability  Visit Diagnosis: Acute pain of left knee  Difficulty in walking, not elsewhere classified  Other abnormalities of gait and mobility  Other lack of coordination     Problem List Patient Active Problem List   Diagnosis Date Noted  . Excessive daytime sleepiness 10/17/2017  . Vitamin D deficiency 12/09/2016  . Attention deficit disorder 12/09/2016  . [place]'s [place] 05/03/2016  . Other headache syndrome 09/08/2015  . [place]verse myelitis ([place] 05/06/2015  . Dysesthesia 05/06/2015  . Neck pain 05/06/2015  . Occipital neuralgia 05/06/2015  . Spastic gait 02/05/2015  . Urinary frequency 02/05/2015  . Other fatigue 02/05/2015  . Multiple lung nodules on CT 09/24/2013  . Nipple discharge 08/04/2012  . 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 VOCAL CORD DISORDER 10/21/2009  . DYSPHAGIA, OROPHARYNGEAL PHASE 10/21/2009  . PULMONARY NODULE, RIGHT UPPER LOBE 01/22/2009  . DYSPNEA 09/24/2008  . BRONCHITIS, OBSTRUCTIVE CHRONIC, WITH EXACERBATION 09/10/2008  . G E R D 09/10/2008  . MULTIPLE SCLEROSIS 03/13/2008  . MIGRAINE, CHRONIC 03/13/2008  . CHRONIC RHINITIS 03/13/2008  . COUGH 03/13/2008    CEarlie Counts PT 07/24/18 11:32 AM   [facility] [address] [place] [address]Phone: [phone]  Fax:   [phone] Name: Jill SPENGLERMRN: [id number] of Birth: 7Jan 09, 1952

## 2018-07-24 NOTE — Telephone Encounter (Signed)
Pt request refill for amphetamine-dextroamphetamine (ADDERALL XR) 25 MG 24 hr capsule [facility] to [facility]

## 2018-07-24 NOTE — Telephone Encounter (Signed)
Rx registry checked. Last fill date is 06/20/18 for #30. Next OV is 10/19/18.

## 2018-07-25 ENCOUNTER — Other Ambulatory Visit: Payer: Self-pay | Admitting: Neurology

## 2018-07-26 ENCOUNTER — Ambulatory Visit: Payer: PPO | Admitting: Physical Therapy

## 2018-07-26 ENCOUNTER — Encounter: Payer: Self-pay | Admitting: Physical Therapy

## 2018-07-26 DIAGNOSIS — R278 Other lack of coordination: Secondary | ICD-10-CM

## 2018-07-26 DIAGNOSIS — M25562 Pain in left knee: Secondary | ICD-10-CM | POA: Diagnosis not present

## 2018-07-26 DIAGNOSIS — R2689 Other abnormalities of gait and mobility: Secondary | ICD-10-CM

## 2018-07-26 DIAGNOSIS — R262 Difficulty in walking, not elsewhere classified: Secondary | ICD-10-CM
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
## 2018-07-27 ENCOUNTER — Encounter (INDEPENDENT_AMBULATORY_CARE_PROVIDER_SITE_OTHER): Payer: Self-pay | Admitting: Orthopedic Surgery

## 2018-07-27 ENCOUNTER — Ambulatory Visit (INDEPENDENT_AMBULATORY_CARE_PROVIDER_SITE_OTHER): Payer: PPO | Admitting: Orthopedic Surgery

## 2018-07-27 VITALS — Ht 70.0 in | Wt 135.0 lb

## 2018-07-27 DIAGNOSIS — M25562 Pain in left knee: Secondary | ICD-10-CM

## 2018-07-30 ENCOUNTER — Encounter (INDEPENDENT_AMBULATORY_CARE_PROVIDER_SITE_OTHER): Payer: Self-pay | Admitting: Orthopedic Surgery

## 2018-07-30 DIAGNOSIS — M25562 Pain in left knee: Secondary | ICD-10-CM | POA: Diagnosis not present

## 2018-07-30 MED ORDER — METHYLPREDNISOLONE ACETATE 40 MG/ML IJ SUSP
40.0000 mg | INTRAMUSCULAR | Status: AC | PRN
  Administered 2018-07-30: 40 mg via INTRA_ARTICULAR

## 2018-07-30 MED ORDER — LIDOCAINE HCL 1 % IJ SOLN
5.0000 mL | INTRAMUSCULAR | Status: AC | PRN
  Administered 2018-07-30: 5 mL
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
## 2018-08-01 ENCOUNTER — Encounter: Payer: Self-pay | Admitting: Physical Therapy

## 2018-08-01 ENCOUNTER — Ambulatory Visit: Payer: PPO | Attending: Internal Medicine | Admitting: Physical Therapy

## 2018-08-01 DIAGNOSIS — R278 Other lack of coordination: Secondary | ICD-10-CM | POA: Insufficient documentation

## 2018-08-01 DIAGNOSIS — R2689 Other abnormalities of gait and mobility: Secondary | ICD-10-CM | POA: Diagnosis not present

## 2018-08-01 DIAGNOSIS — R262 Difficulty in walking, not elsewhere classified: Secondary | ICD-10-CM | POA: Insufficient documentation

## 2018-08-01 DIAGNOSIS — M25562 Pain in left knee: Secondary | ICD-10-CM | POA: Diagnosis not present

## 2018-08-01 NOTE — Therapy (Signed)
[facility] [address] [place], [place], [address] Phone: [phone]   Fax:  [phone]  Physical Therapy Treatment  Patient Details  Name: [name] MRN: [id number] Date of Birth: Oct 17, 1951 Referring Provider: Dr. Crist Infante   Encounter Date: 08/01/2018  PT End of Session - 08/01/18 1011    Visit Number  10    Number of Visits  13    Date for PT Re-Evaluation  08/15/18    Authorization Type  Health team    PT Start Time  0930    PT Stop Time  1012    PT Time Calculation (min)  42 min    Activity Tolerance  Patient tolerated treatment well        Progress Note Reporting Period 06/20/18 to 08/01/18  See note below for Objective Data and Assessment of Progress/Goals.      Past Medical History:  Diagnosis Date  . Arthritis    back  . Dysesthesia    bilateral feet  . Family history of adverse reaction to anesthesia    mother -- ponv  . Fatigue   . Fecal incontinence   . Frequent falls    due to MS  . GERD (gastroesophageal reflux disease)   . Heart murmur   . History of colon polyps    2013;  2015  . 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 Migraines    right side   . Mitral valve prolapse    states no problem  . Multiple sclerosis [facility]) 1989   neurologist-  dr Felecia Shelling  . Neurogenic bladder   . Osteoporosis   . PONV (postoperative nausea and vomiting)   . Pulmonary nodule, right    pulmologist-  dr Milinda Hirschfeld--  stable per ct 05-17-2016  . Short of breath on exertion   . Sjogren's disease ([place])    dryness of eyes, mouth  . Spastic gait   . 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 Wears bilateral ankle braces    AFO braces for stability    Past Surgical History:  Procedure Laterality Date  . ANAL RECTAL MANOMETRY N/A 08/18/2016   Procedure: ANO RECTAL MANOMETRY;  Surgeon: Leighton Ruff, MD;  Location: [facility];   Service: Endoscopy;  Laterality: N/A;  . ANTERIOR CERVICAL DECOMP/DISCECTOMY FUSION  2004;   2003   C4 -- C5 (2004)/   C3 -- C4 (2003)  . APPENDECTOMY  06/22/2011   laparoscopic  . BILATERAL SALPINGOOPHORECTOMY  1982  . BLADDER SUSPENSION  1994   sling  . BREAST ENHANCEMENT SURGERY Bilateral   . BREAST IMPLANT REMOVAL Bilateral 12/03/2008  . BUNIONECTOMY    . CATARACT EXTRACTION W/ INTRAOCULAR LENS  IMPLANT, BILATERAL  2015  . ELBOW SURGERY Right   . HEMORROIDECTOMY  08/05/2010;  2013  . INSERTION SACRAL NERVE STIMULATOR TEST WIRE  09-28-2016   dr Marcello Moores  . INTERCOSTAL NERVE BLOCK  2015   occipital  . RECTAL ULTRASOUND N/A 08/18/2016   Procedure: RECTAL ULTRASOUND;  Surgeon: Leighton Ruff, MD;  Location: [facility];  Service: Endoscopy;  Laterality: N/A;  . SHOULDER ARTHROSCOPY Left   . SHOULDER ARTHROSCOPY WITH DISTAL CLAVICLE RESECTION Right 09/21/2007   w/  Acrominoplasty,  Debridement Rotator Cuff,  CA ligament release  . TONSILLECTOMY  age 67  . TRIGGER FINGER RELEASE Left 01/16/2015   Procedure: LEFT THUMB TRIGGER RELEASE ;  Surgeon: Leanora Cover, MD;  Location: [facility];  Service: Orthopedics;  Laterality: Left;  Marland Kitchen VAGINAL HYSTERECTOMY  1981    There were no vitals filed for this visit.  Subjective Assessment - 08/01/18 0928    Subjective  Saw Dr. Sharol Given.  Can't do MRI b/c of bladder stimulator.  Had a cortisone shot in knee.  Hurt 2 days ago but today is better.      Pertinent History  MS, s/p cerivcal fusion    Currently in Pain?  Yes    Pain Score  2     Pain Location  Knee    Pain Orientation  Left         [facility] PT Assessment - 08/01/18 0001      AROM   Overall AROM Comments  unable to fully extend right or left knee actively in sitting active ankle DF to neutral only bil    Left Knee Extension  -- seated -30       Strength   Right Hip Flexion  3/5    Right Knee Flexion  2+/5    Right Knee Extension  2+/5    Left Knee Flexion  2+/5    Left  Knee Extension  2+/5    Left Ankle Dorsiflexion  3-/5    Left Ankle Plantar Flexion  3-/5    Left Ankle Inversion  3-/5    Left Ankle Eversion  3-/5      Berg Balance Test   Berg comment:  24                   [facility] Adult PT Treatment/Exercise - 08/01/18 0001      Lumbar Exercises: Seated   Other Seated Lumbar Exercises  foam roll push down 10x 5 sec holds    Other Seated Lumbar Exercises  2# red plyoball chops 10x each side.        Knee/Hip Exercises: Aerobic   Nustep  level 1 seat#11, arm#9 6 minutes while assessing patient progress      Knee/Hip Exercises: Seated   Other Seated Knee/Hip Exercises  rocker board 20x  assiting for movement; Pt. watches feet    Other Seated Knee/Hip Exercises  floor slider with ball squeeze  10x      Knee/Hip Exercises: Sidelying   Other Sidelying Knee/Hip Exercises  left sidely left knee extension with slide board 20 x able to get it fully extended now on her own               PT Short Term Goals - 08/01/18 1438      PT SHORT TERM GOAL #1   Title  Pt will be demonstrate knowledge in basic self care strategies for knee pain control     Status  Achieved      PT SHORT TERM GOAL #2   Title  Left knee pain improved by 30% with basic ADLs    Status  Achieved      PT SHORT TERM GOAL #3   Title  The patient will have BERG balance score of 20/56 indicating improved safety and balance with standing and walking    Status  Achieved        PT Long Term Goals - 06/20/18 1810      PT LONG TERM GOAL #1   Title  The patient will be independent with safe self progression of HEP    Time  8    Period  Weeks  Status  New    Target Date  08/15/18      PT LONG TERM GOAL #2   Title  Pt will be able to report left knee pain </= 3/10 when walking >/= 300 feet on community surfaces using her single point cane.     Time  8    Period  Weeks    Status  New      PT LONG TERM GOAL #3   Title  The patient will report a 60% reduction  in left knee pain with usual ADLs    Time  8    Period  Weeks    Status  New      PT LONG TERM GOAL #4   Title  BERG balance score will improve to 25/56 indicating improved balance with standing and walking     Time  8    Period  Weeks    Status  New            Plan - 08/01/18 1429    Clinical Impression Statement  The patient reports some recent improvement in left knee pain since receiving an injection 5 days ago.  She is able to participate in active assisted knee ex's without an exacerbation of knee pain.  Distal UE and LE weakness associated with MS.  Good improvement with BERG balance test since initial evaluation but still at high risk for falls secondary to neurologic weakness.  Progress with rehab has also been slowed by  persistent knee pain and hypersensitivity.  Therapist closely monitoring pain and assist with motor control and modifications as needed.      Rehab Potential  Good    Clinical Impairments Affecting Rehab Potential  MS, spastic gait, fatigue, risk of falls;  lateral knee hypersensitivity     PT Frequency  2x / week    PT Duration  8 weeks    PT Treatment/Interventions  ADLs/Self Care Home Management;Iontophoresis 4mg /ml Dexamethasone;Ultrasound;Moist Heat;Cryotherapy;Electrical Stimulation;Therapeutic exercise;Therapeutic activities;Gait training;Functional mobility training;Neuromuscular re-education;Patient/family education;Manual techniques;Taping;Vasopneumatic Device    PT Next Visit Plan  try supine or seated Swiss ball pushes for core muscle activation;  continue seated foam roll push downs;  ball squeeze improves motor control with knee flexion;  try standing ex as tolerated by pain     PT Home Exercise Plan  Access Code: [email or web address]        Patient will benefit from skilled therapeutic intervention in order to improve the following deficits and impairments:  Abnormal gait, Pain, Decreased balance, Decreased strength, Decreased mobility, Postural  dysfunction, Decreased activity tolerance, Difficulty walking, Impaired perceived functional ability  Visit Diagnosis: Acute pain of left knee  Difficulty in walking, not elsewhere classified  Other abnormalities of gait and mobility     Problem List Patient Active Problem List   Diagnosis Date Noted  . Excessive daytime sleepiness 10/17/2017  . Vitamin D deficiency 12/09/2016  . Attention deficit disorder 12/09/2016  . Sjogren's disease ([place]) 05/03/2016  . Other headache syndrome 09/08/2015  . Transverse myelitis ([place]) 05/06/2015  . Dysesthesia 05/06/2015  . Neck pain 05/06/2015  . Occipital neuralgia 05/06/2015  . Spastic gait 02/05/2015  . Urinary frequency 02/05/2015  . Other fatigue 02/05/2015  . Multiple lung nodules on CT 09/24/2013  . Nipple discharge 08/04/2012  . 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 VOCAL CORD DISORDER 10/21/2009  . DYSPHAGIA, OROPHARYNGEAL PHASE 10/21/2009  . PULMONARY NODULE, RIGHT UPPER LOBE 01/22/2009  . DYSPNEA 09/24/2008  . BRONCHITIS, OBSTRUCTIVE CHRONIC, WITH EXACERBATION 09/10/2008  .  G E R D 09/10/2008  . MULTIPLE SCLEROSIS 03/13/2008  . MIGRAINE, CHRONIC 03/13/2008  . CHRONIC RHINITIS 03/13/2008  . COUGH 03/13/2008   Ruben Im, PT 08/01/18 2:41 PM Phone: [phone] Fax: [phone]  Alvera Singh 08/01/2018, 2:39 PM  [facility] [address], [place], [place], [address] Phone: [phone]   Fax:  [phone]  Name: [name] MRN: [id number] Date of Birth: [birth year]

## 2018-08-04 ENCOUNTER — Encounter: Payer: PPO | Admitting: Physical Therapy

## 2018-08-08 ENCOUNTER — Ambulatory Visit: Payer: PPO | Admitting: Physical Therapy

## 2018-08-08 ENCOUNTER — Encounter: Payer: Self-pay | Admitting: Physical Therapy

## 2018-08-08 DIAGNOSIS — M25562 Pain in left knee: Secondary | ICD-10-CM | POA: Diagnosis not present

## 2018-08-08 DIAGNOSIS — R262 Difficulty in walking, not elsewhere classified: Secondary | ICD-10-CM

## 2018-08-08 DIAGNOSIS — R2689 Other abnormalities of gait and mobility: Secondary | ICD-10-CM

## 2018-08-08 NOTE — Therapy (Signed)
[facility] [address], [place], [place], [address] Phone: [phone]   Fax:  [phone]  Physical Therapy Treatment  Patient Details  Name: [name] MRN: [id number] Date of Birth: Oct 05, 1951 Referring Provider: Dr. Crist Infante   Encounter Date: 08/08/2018  PT End of Session - 08/08/18 1007    Visit Number  11    Number of Visits  13    Date for PT Re-Evaluation  08/15/18    Authorization Type  Health team    PT Start Time  0932    PT Stop Time  6962    PT Time Calculation (min)  43 min    Activity Tolerance  Patient tolerated treatment well       Past Medical History:  Diagnosis Date  . Arthritis    back  . Dysesthesia    bilateral feet  . Family history of adverse reaction to anesthesia    mother -- ponv  . Fatigue   . Fecal incontinence   . Frequent falls    due to MS  . GERD (gastroesophageal reflux disease)   . Heart murmur   . History of colon polyps    2013;  2015  . 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 Migraines    right side   . Mitral valve prolapse    states no problem  . Multiple sclerosis [facility]) 1989   neurologist-  dr Felecia Shelling  . Neurogenic bladder   . Osteoporosis   . PONV (postoperative nausea and vomiting)   . Pulmonary nodule, right    pulmologist-  dr Milinda Hirschfeld--  stable per ct 05-17-2016  . Short of breath on exertion   . Sjogren's disease ([place])    dryness of eyes, mouth  . Spastic gait   . 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 Wears bilateral ankle braces    AFO braces for stability    Past Surgical History:  Procedure Laterality Date  . ANAL RECTAL MANOMETRY N/A 08/18/2016   Procedure: ANO RECTAL MANOMETRY;  Surgeon: Leighton Ruff, MD;  [facility];  Service: Endoscopy;  Laterality: N/A;  . ANTERIOR CERVICAL DECOMP/DISCECTOMY FUSION  2004;   2003   C4 -- C5 (2004)/   C3 -- C4  (2003)  . APPENDECTOMY  06/22/2011   laparoscopic  . BILATERAL SALPINGOOPHORECTOMY  1982  . BLADDER SUSPENSION  1994   sling  . BREAST ENHANCEMENT SURGERY Bilateral   . BREAST IMPLANT REMOVAL Bilateral 12/03/2008  . BUNIONECTOMY    . CATARACT EXTRACTION W/ INTRAOCULAR LENS  IMPLANT, BILATERAL  2015  . ELBOW SURGERY Right   . HEMORROIDECTOMY  08/05/2010;  2013  . INSERTION SACRAL NERVE STIMULATOR TEST WIRE  09-28-2016   dr [place]o Moores  . INTERCOSTAL NERVE BLOCK  2015   occipital  . RECTAL ULTRASOUND N/A 08/18/2016   Procedure: RECTAL ULTRASOUND;  Surgeon: Leighton Ruff, MD;  [facility];  Service: Endoscopy;  Laterality: N/A;  . SHOULDER ARTHROSCOPY Left   . SHOULDER ARTHROSCOPY WITH DISTAL CLAVICLE RESECTION Right 09/21/2007   w/  Acrominoplasty,  Debridement Rotator Cuff,  CA ligament release  . TONSILLECTOMY  age 49  . TRIGGER FINGER RELEASE Left 01/16/2015   Procedure: LEFT THUMB TRIGGER RELEASE ;  Surgeon: Leanora Cover, MD;  Location: [facility];  Service: Orthopedics;  Laterality: Left;  Marland Kitchen [place]  There were no vitals filed for this visit.  Subjective Assessment - 08/08/18 0937    Subjective  I think the injection in my knee has helped.  Still hurts but less sensitive.      Currently in Pain?  Yes    Pain Score  2     Pain Location  Knee    Pain Orientation  Left    Pain Type  Chronic pain                       [facility] Adult PT Treatment/Exercise - 08/08/18 0001      Lumbar Exercises: Seated   Other Seated Lumbar Exercises  foam roll push down 10x 5 sec holds      Lumbar Exercises: Supine   Other Supine Lumbar Exercises  blue ball on stomach UE/pelvis isometric pushes 5 sec hold    LEs on wedge     Knee/Hip Exercises: Aerobic   Nustep  level 1 seat#11, arm#9 6 minutes   while assessing patient progress     Knee/Hip Exercises: Seated   Other Seated Knee/Hip Exercises  rocker board 20x    assiting for  movement; Pt. watches feet   Other Seated Knee/Hip Exercises  floor slider with ball squeeze  10x      Knee/Hip Exercises: Supine   Other Supine Knee/Hip Exercises  assisted hip/knee flexion 5x    Other Supine Knee/Hip Exercises  assisted hip abduction/adduction 5x      Knee/Hip Exercises: Sidelying   Other Sidelying Knee/Hip Exercises  left sidely left knee extension with slide board 20 x   able to get it fully extended now on her own              PT Short Term Goals - 08/08/18 1438      PT SHORT TERM GOAL #1   Title  Pt will be demonstrate knowledge in basic self care strategies for knee pain control     Status  Achieved      PT SHORT TERM GOAL #2   Title  Left knee pain improved by 30% with basic ADLs    Status  Achieved      PT SHORT TERM GOAL #3   Title  The patient will have BERG balance score of 20/56 indicating improved safety and balance with standing and walking    Status  Achieved        PT Long Term Goals - 06/20/18 1810      PT LONG TERM GOAL #1   Title  The patient will be independent with safe self progression of HEP    Time  8    Period  Weeks    Status  New    Target Date  08/15/18      PT LONG TERM GOAL #2   Title  Pt will be able to report left knee pain </= 3/10 when walking >/= 300 feet on community surfaces using her single point cane.     Time  8    Period  Weeks    Status  New      PT LONG TERM GOAL #3   Title  The patient will report a 60% reduction in left knee pain with usual ADLs    Time  8    Period  Weeks    Status  New      PT LONG TERM GOAL #4   Title  BERG balance score will improve to 25/56 indicating  improved balance with standing and walking     Time  8    Period  Weeks    Status  New            Plan - 08/08/18 1433    Clinical Impression Statement  The patient reports decreased left lateral knee tenderness although still painful with compression.   She is able to activate lower abdominals fairly well but  distal extremities are uncoordinated and weak secondary to MS.   Therapist closely monitoring pain and providing supervision for safety.  No increase in knee pain following treatment session.      Rehab Potential  Good    Clinical Impairments Affecting Rehab Potential  MS, spastic gait, fatigue, risk of falls;  lateral knee hypersensitivity     PT Frequency  2x / week    PT Duration  8 weeks    PT Treatment/Interventions  ADLs/Self Care Home Management;Iontophoresis 4mg /ml Dexamethasone;Ultrasound;Moist Heat;Cryotherapy;Electrical Stimulation;Therapeutic exercise;Therapeutic activities;Gait training;Functional mobility training;Neuromuscular re-education;Patient/family education;Manual techniques;Taping;Vasopneumatic Device    PT Next Visit Plan  core strength;   ball squeeze improves motor control with knee flexion;  try standing ex as tolerated by pain ;  ERO in 1 week    PT Home Exercise Plan  Access Code: [email or web address]        Patient will benefit from skilled therapeutic intervention in order to improve the following deficits and impairments:  Abnormal gait, Pain, Decreased balance, Decreased strength, Decreased mobility, Postural dysfunction, Decreased activity tolerance, Difficulty walking, Impaired perceived functional ability  Visit Diagnosis: Acute pain of left knee  Difficulty in walking, not elsewhere classified  Other abnormalities of gait and mobility     Problem List Patient Active Problem List   Diagnosis Date Noted  . Excessive daytime sleepiness 10/17/2017  . Vitamin D deficiency 12/09/2016  . Attention deficit disorder 12/09/2016  . Sjogren's disease ([place]) 05/03/2016  . Other headache syndrome 09/08/2015  . Transverse myelitis ([place]) 05/06/2015  . Dysesthesia 05/06/2015  . Neck pain 05/06/2015  . Occipital neuralgia 05/06/2015  . Spastic gait 02/05/2015  . Urinary frequency 02/05/2015  . Other fatigue 02/05/2015  . Multiple lung nodules on CT 09/24/2013  . Nipple  discharge 08/04/2012  . 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 VOCAL CORD DISORDER 10/21/2009  . DYSPHAGIA, OROPHARYNGEAL PHASE 10/21/2009  . PULMONARY NODULE, RIGHT UPPER LOBE 01/22/2009  . DYSPNEA 09/24/2008  . BRONCHITIS, OBSTRUCTIVE CHRONIC, WITH EXACERBATION 09/10/2008  . G E R D 09/10/2008  . MULTIPLE SCLEROSIS 03/13/2008  . MIGRAINE, CHRONIC 03/13/2008  . CHRONIC RHINITIS 03/13/2008  . COUGH 03/13/2008   Ruben Im, PT 08/08/18 2:40 PM Phone: [phone] Fax: [phone]  Alvera Singh 08/08/2018, 2:40 PM  [facility] [address] [place], [place], [address] Phone: [phone]   Fax:  [phone]  Name: [name] MRN: [id number] Date of Birth: 01-07-1951

## 2018-08-09 DIAGNOSIS — Z01419 Encounter for gynecological examination (general) (routine) without abnormal findings: Secondary | ICD-10-CM | POA: Diagnosis not present

## 2018-08-10 ENCOUNTER — Encounter: Payer: Self-pay | Admitting: Physical Therapy

## 2018-08-10 ENCOUNTER — Ambulatory Visit: Payer: PPO | Admitting: Physical Therapy

## 2018-08-10 DIAGNOSIS — R262 Difficulty in walking, not elsewhere classified: Secondary | ICD-10-CM

## 2018-08-10 DIAGNOSIS — M25562 Pain in left knee: Secondary | ICD-10-CM | POA: Diagnosis not present

## 2018-08-10 DIAGNOSIS — R2689 Other abnormalities of gait and mobility: Secondary | ICD-10-CM

## 2018-08-10 NOTE — Therapy (Signed)
[facility] [address] [place], [place], [address] Phone: [phone]   Fax:  [phone]  Physical Therapy Treatment/Recertification   Patient Details  Name: [name] MRN: [id number] Date of Birth: November 26, 1951 Referring Provider: Dr. Crist Infante   Encounter Date: 08/10/2018  PT End of Session - 08/10/18 2039    Visit Number  12    Date for PT Re-Evaluation  10/05/18    Authorization Type  Health team    PT Start Time  [phone]    PT Stop Time  1015    PT Time Calculation (min)  42 min    Activity Tolerance  Patient tolerated treatment well       Past Medical History:  Diagnosis Date  . Arthritis    back  . Dysesthesia    bilateral feet  . Family history of adverse reaction to anesthesia    mother -- ponv  . Fatigue   . Fecal incontinence   . Frequent falls    due to MS  . GERD (gastroesophageal reflux disease)   . Heart murmur   . History of colon polyps    2013;  2015  . 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 Migraines    right side   . Mitral valve prolapse    states no problem  . Multiple sclerosis [facility]) 1989   neurologist-  dr Felecia Shelling  . Neurogenic bladder   . Osteoporosis   . PONV (postoperative nausea and vomiting)   . Pulmonary nodule, right    pulmologist-  dr Milinda Hirschfeld--  stable per ct 05-17-2016  . Short of breath on exertion   . Sjogren's disease ([place])    dryness of eyes, mouth  . Spastic gait   . 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 Wears bilateral ankle braces    AFO braces for stability    Past Surgical History:  Procedure Laterality Date  . ANAL RECTAL MANOMETRY N/A 08/18/2016   Procedure: ANO RECTAL MANOMETRY;  Surgeon: Leighton Ruff, MD;  Location: [facility];  Service: Endoscopy;  Laterality: N/A;  . ANTERIOR CERVICAL DECOMP/DISCECTOMY FUSION  2004;   2003   C4 -- C5 (2004)/   C3 -- C4 (2003)  .  APPENDECTOMY  06/22/2011   laparoscopic  . BILATERAL SALPINGOOPHORECTOMY  1982  . BLADDER SUSPENSION  1994   sling  . BREAST ENHANCEMENT SURGERY Bilateral   . BREAST IMPLANT REMOVAL Bilateral 12/03/2008  . BUNIONECTOMY    . CATARACT EXTRACTION W/ INTRAOCULAR LENS  IMPLANT, BILATERAL  2015  . ELBOW SURGERY Right   . HEMORROIDECTOMY  08/05/2010;  2013  . INSERTION SACRAL NERVE STIMULATOR TEST WIRE  09-28-2016   dr Marcello Moores  . INTERCOSTAL NERVE BLOCK  2015   occipital  . RECTAL ULTRASOUND N/A 08/18/2016   Procedure: RECTAL ULTRASOUND;  Surgeon: Leighton Ruff, MD;  Location: [facility];  Service: Endoscopy;  Laterality: N/A;  . SHOULDER ARTHROSCOPY Left   . SHOULDER ARTHROSCOPY WITH DISTAL CLAVICLE RESECTION Right 09/21/2007   w/  Acrominoplasty,  Debridement Rotator Cuff,  CA ligament release  . TONSILLECTOMY  age 25  . TRIGGER FINGER RELEASE Left 01/16/2015   Procedure: LEFT THUMB TRIGGER RELEASE ;  Surgeon: Leanora Cover, MD;  Location: [place];  Service: Orthopedics;  Laterality: Left;  Marland Kitchen VAGINAL HYSTERECTOMY  1981    There were no vitals  filed for this visit.  Subjective Assessment - 08/10/18 0935    Subjective  65% -70% better knee pain overall but I tweaked my knee getting out of the car so it's a little more right now.   My strength is ebbing.  I am slapping my feet more as the day progresses.   That's the MS.        Pertinent History  MS, s/p cerivcal fusion    Patient Stated Goals  I want my balance to be better;  make core stronger;  make me less fatigue;  Keep going!    Currently in Pain?  Yes    Pain Score  2     Pain Location  Knee    Pain Orientation  Left    Pain Type  Chronic pain         [facility] PT Assessment - 08/10/18 0001      AROM   Overall AROM Comments  partial lift       Strength   Right Hip Flexion  3/5    Right Knee Flexion  2+/5    Right Knee Extension  2+/5    Left Knee Flexion  2+/5    Left Knee Extension  2+/5    Left Ankle  Dorsiflexion  3-/5    Left Ankle Plantar Flexion  3-/5    Left Ankle Inversion  3-/5    Left Ankle Eversion  3-/5      Berg Balance Test   Sit to Stand  Able to stand  independently using hands    Standing Unsupported  Able to stand 2 minutes with supervision    Sitting with Back Unsupported but Feet Supported on Floor or Stool  Able to sit safely and securely 2 minutes    Stand to Sit  Controls descent by using hands    Transfers  Able to transfer safely, definite need of hands    Standing Unsupported with Eyes Closed  Able to stand 3 seconds    Standing Ubsupported with Feet Together  Able to place feet together independently but unable to hold for 30 seconds    From Standing, Reach Forward with Outstretched Arm  Reaches forward but needs supervision    From Standing Position, Pick up Object from Floor  Unable to try/needs assist to keep balance    From Standing Position, Turn to Look Behind Over each Shoulder  Turn sideways only but maintains balance    Turn 360 Degrees  Needs close supervision or verbal cueing    Standing Unsupported, Alternately Place Feet on Step/Stool  Needs assistance to keep from falling or unable to try    Standing Unsupported, One Foot in Front  Needs help to step but can hold 15 seconds    Standing on One Leg  Unable to try or needs assist to prevent fall    Total Score  25    Berg comment:  25                   [facility] Adult PT Treatment/Exercise - 08/10/18 0001      Lumbar Exercises: Supine   Other Supine Lumbar Exercises  small ball between knees and green band around thighs with abdominal brace    Other Supine Lumbar Exercises  small ball between knees and green band around thighs with UE presses with hands, elbows, shoulders      Knee/Hip Exercises: Aerobic   Nustep  level 1 sea#11, arm#9 6 minutes   while  assessing patient progress     Knee/Hip Exercises: Supine   Other Supine Knee/Hip Exercises  sliding board heel slides with ball  between knees and green band around thighs 15x      Knee/Hip Exercises: Sidelying   Other Sidelying Knee/Hip Exercises  left sidely left knee extension with slide board 20 x   able to get it fully extended now on her own              PT Short Term Goals - 08/10/18 2045      PT SHORT TERM GOAL #1   Title  Pt will be demonstrate knowledge in basic self care strategies for knee pain control     Status  Achieved      PT SHORT TERM GOAL #2   Title  Left knee pain improved by 30% with basic ADLs    Status  Achieved      PT SHORT TERM GOAL #3   Title  The patient will have BERG balance score of 20/56 indicating improved safety and balance with standing and walking    Status  Achieved        PT Long Term Goals - 08/10/18 0932      PT LONG TERM GOAL #1   Title  The patient will be independent with safe self progression of HEP    Time  8    Period  Weeks    Status  On-going      PT LONG TERM GOAL #2   Title  Pt will be able to report left knee pain </= 3/10 when walking >/= 200 feet on community surfaces using her single point cane.     Time  8    Period  Weeks    Status  Revised      PT LONG TERM GOAL #3   Title  The patient will report a 60% reduction in left knee pain with usual ADLs    Baseline  65-70%    Status  Achieved      PT LONG TERM GOAL #4   Title  BERG balance score will improve to 25/56 indicating improved balance with standing and walking     Status  Achieved      PT LONG TERM GOAL #5   Title  The patient will be able to get in /out of the car with minimal to no knee pain     Time  8    Period  Weeks    Status  New      Additional Long Term Goals   Additional Long Term Goals  Yes      PT LONG TERM GOAL #6   Title  The patient will report a 75-80% improvement in left knee pain with usual ADLS    Time  8    Period  Weeks    Status  New            Plan - 08/10/18 2040    Clinical Impression Statement  The patient reports an overall  improvement in knee pain since getting an injection a couple of weeks ago although she is more painful today.  Progress has been slower than usual secondary to MS with neurological deficits especially in extremities.  Her lack of motor control in left LE may be affecting her ongoing pain.  She has made improvements in BERG balance score and left knee ROM.  Without PT she would have a decline in status.  Recommend continued PT to  address continued knee pain and deficits.      Rehab Potential  Good    Clinical Impairments Affecting Rehab Potential  MS, spastic gait, fatigue, risk of falls;  lateral knee hypersensitivity     PT Frequency  2x / week    PT Duration  8 weeks    PT Treatment/Interventions  ADLs/Self Care Home Management;Iontophoresis 4mg /ml Dexamethasone;Ultrasound;Moist Heat;Cryotherapy;Electrical Stimulation;Therapeutic exercise;Therapeutic activities;Gait training;Functional mobility training;Neuromuscular re-education;Patient/family education;Manual techniques;Taping;Vasopneumatic Device    PT Next Visit Plan  core strength;   ball squeeze improves motor control with knee flexion;  try standing ex as tolerated by pain;  sees Dr. Sharol Given 8/29    PT Home Exercise Plan  Access Code: [email or web address]        Patient will benefit from skilled therapeutic intervention in order to improve the following deficits and impairments:  Abnormal gait, Pain, Decreased balance, Decreased strength, Decreased mobility, Postural dysfunction, Decreased activity tolerance, Difficulty walking, Impaired perceived functional ability  Visit Diagnosis: Acute pain of left knee - Plan: PT plan of care cert/re-cert  Difficulty in walking, not elsewhere classified - Plan: PT plan of care cert/re-cert  Other abnormalities of gait and mobility - Plan: PT plan of care cert/re-cert     Problem List Patient Active Problem List   Diagnosis Date Noted  . Excessive daytime sleepiness 10/17/2017  . Vitamin D deficiency  12/09/2016  . Attention deficit disorder 12/09/2016  . Sjogren's disease ([place]) 05/03/2016  . Other headache syndrome 09/08/2015  . Transverse myelitis ([place]) 05/06/2015  . Dysesthesia 05/06/2015  . Neck pain 05/06/2015  . Occipital neuralgia 05/06/2015  . Spastic gait 02/05/2015  . Urinary frequency 02/05/2015  . Other fatigue 02/05/2015  . Multiple lung nodules on CT 09/24/2013  . Nipple discharge 08/04/2012  . 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 VOCAL CORD DISORDER 10/21/2009  . DYSPHAGIA, OROPHARYNGEAL PHASE 10/21/2009  . PULMONARY NODULE, RIGHT UPPER LOBE 01/22/2009  . DYSPNEA 09/24/2008  . BRONCHITIS, OBSTRUCTIVE CHRONIC, WITH EXACERBATION 09/10/2008  . G E R D 09/10/2008  . MULTIPLE SCLEROSIS 03/13/2008  . MIGRAINE, CHRONIC 03/13/2008  . CHRONIC RHINITIS 03/13/2008  . COUGH 03/13/2008   Ruben Im, PT 08/10/18 8:57 PM Phone: [phone] Fax: [phone] Alvera Singh 08/10/2018, 8:57 PM   [facility] [address] [place], [place], [address] Phone: [phone]   Fax:  [phone]  Name: [name] MRN: [id number] Date of Birth: 1951-06-26

## 2018-08-15 ENCOUNTER — Other Ambulatory Visit: Payer: Self-pay | Admitting: Neurology

## 2018-08-15 ENCOUNTER — Encounter: Payer: PPO | Admitting: Physical Therapy

## 2018-08-18 ENCOUNTER — Encounter: Payer: Self-pay | Admitting: Physical Therapy

## 2018-08-18 ENCOUNTER — Ambulatory Visit: Payer: PPO | Admitting: Physical Therapy

## 2018-08-18 DIAGNOSIS — R278 Other lack of coordination: Secondary | ICD-10-CM

## 2018-08-18 DIAGNOSIS — M25562 Pain in left knee: Secondary | ICD-10-CM

## 2018-08-18 DIAGNOSIS — R262 Difficulty in walking, not elsewhere classified: Secondary | ICD-10-CM

## 2018-08-18 DIAGNOSIS — R2689 Other abnormalities of gait and mobility: Secondary | ICD-10-CM

## 2018-08-18 NOTE — Therapy (Signed)
[facility] [address] [place], [place], [address] Phone: [phone]   Fax:  [phone]  Physical Therapy Treatment  Patient Details  Name: [name] MRN: [id number] Date of Birth: 1951/10/28 Referring Provider: Dr. Crist Infante   Encounter Date: 08/18/2018  PT End of Session - 08/18/18 1006    Visit Number  13    Date for PT Re-Evaluation  10/05/18    Authorization Type  Health team    PT Start Time  0930    PT Stop Time  1010    PT Time Calculation (min)  40 min    Activity Tolerance  Patient tolerated treatment well    Behavior During Therapy  [facility] for tasks assessed/performed       Past Medical History:  Diagnosis Date  . Arthritis    back  . Dysesthesia    bilateral feet  . Family history of adverse reaction to anesthesia    mother -- ponv  . Fatigue   . Fecal incontinence   . Frequent falls    due to MS  . GERD (gastroesophageal reflux disease)   . Heart murmur   . History of colon polyps    2013;  2015  . 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 Migraines    right side   . Mitral valve prolapse    states no problem  . Multiple sclerosis [facility]) 1989   neurologist-  dr Felecia Shelling  . Neurogenic bladder   . Osteoporosis   . PONV (postoperative nausea and vomiting)   . Pulmonary nodule, right    pulmologist-  dr Milinda Hirschfeld--  stable per ct 05-17-2016  . Short of breath on exertion   . Sjogren's disease ([place])    dryness of eyes, mouth  . Spastic gait   . 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 Wears bilateral ankle braces    AFO braces for stability    Past Surgical History:  Procedure Laterality Date  . ANAL RECTAL MANOMETRY N/A 08/18/2016   Procedure: ANO RECTAL MANOMETRY;  Surgeon: Leighton Ruff, MD;  [facility];  Service: Endoscopy;  Laterality: N/A;  . ANTERIOR CERVICAL DECOMP/DISCECTOMY FUSION  2004;    2003   C4 -- C5 (2004)/   C3 -- C4 (2003)  . APPENDECTOMY  06/22/2011   laparoscopic  . BILATERAL SALPINGOOPHORECTOMY  1982  . BLADDER SUSPENSION  1994   sling  . BREAST ENHANCEMENT SURGERY Bilateral   . BREAST IMPLANT REMOVAL Bilateral 12/03/2008  . BUNIONECTOMY    . CATARACT EXTRACTION W/ INTRAOCULAR LENS  IMPLANT, BILATERAL  2015  . ELBOW SURGERY Right   . HEMORROIDECTOMY  08/05/2010;  2013  . INSERTION SACRAL NERVE STIMULATOR TEST WIRE  09-28-2016   dr Marcello Moores  . INTERCOSTAL NERVE BLOCK  2015   occipital  . RECTAL ULTRASOUND N/A 08/18/2016   Procedure: RECTAL ULTRASOUND;  Surgeon: Leighton Ruff, MD;  [facility];  Service: Endoscopy;  Laterality: N/A;  . SHOULDER ARTHROSCOPY Left   . SHOULDER ARTHROSCOPY WITH DISTAL CLAVICLE RESECTION Right 09/21/2007   w/  Acrominoplasty,  Debridement Rotator Cuff,  CA ligament release  . TONSILLECTOMY  age 67  . TRIGGER FINGER RELEASE Left 01/16/2015   Procedure: LEFT THUMB TRIGGER RELEASE ;  Surgeon: Leanora Cover, MD;  Location: [place];  Service: Orthopedics;  Laterality: Left;  Marland Kitchen VAGINAL  HYSTERECTOMY  1981    There were no vitals filed for this visit.  Subjective Assessment - 08/18/18 0935    Subjective  My left knee still hurts. When I am tired, I have less strength. I want to improve my core strength.  I see Dr. Sharol Given on 08/24/2018 for my left knee. I am slapping my feet when walking.     Pertinent History  MS, s/p cerivcal fusion    Limitations  House hold activities;Walking    How long can you sit comfortably?  unlimited    How long can you walk comfortably?  50 yards;  uses 2 canes at times    Diagnostic tests  xrays of knee after accident     Patient Stated Goals  I want my balance to be better;  make core stronger;  make me less fatigue;  Keep going!    Currently in Pain?  Yes    Pain Location  Knee    Pain Orientation  Left    Pain Descriptors / Indicators  Aching;Sore    Pain Type  Chronic pain    Pain  Onset  More than a month ago    Pain Frequency  Intermittent    Aggravating Factors   twist knee a certain way; touching lateral knee    Pain Relieving Factors  mobilization to correct leg length    Multiple Pain Sites  No                       [facility] Adult PT Treatment/Exercise - 08/18/18 0001      Lumbar Exercises: Supine   Ab Set  10 reps;5 seconds    AB Set Limitations  green band around the knees to work on hip ER and press hands into the ball, VC to breath    Bridge  10 reps;1 second;Limitations    Bridge Limitations  green band around knees to work on hip ER with tactile cues to lift hips, lift 4 inches only    Large Ball Oblique Isometric  10 reps;5 seconds   each way;      Knee/Hip Exercises: Aerobic   Nustep  level 1 sea#11, arm#9 8 minutes   while assessing patient progress     Knee/Hip Exercises: Seated   Other Seated Knee/Hip Exercises  rocker board 20x    assiting for movement; Pt. watches feet   Other Seated Knee/Hip Exercises  floor slider with ball squeeze  10x               PT Short Term Goals - 08/10/18 2045      PT SHORT TERM GOAL #1   Title  Pt will be demonstrate knowledge in basic self care strategies for knee pain control     Status  Achieved      PT SHORT TERM GOAL #2   Title  Left knee pain improved by 30% with basic ADLs    Status  Achieved      PT SHORT TERM GOAL #3   Title  The patient will have BERG balance score of 20/56 indicating improved safety and balance with standing and walking    Status  Achieved        PT Long Term Goals - 08/10/18 0932      PT LONG TERM GOAL #1   Title  The patient will be independent with safe self progression of HEP    Time  8    Period  Weeks  Status  On-going      PT LONG TERM GOAL #2   Title  Pt will be able to report left knee pain </= 3/10 when walking >/= 200 feet on community surfaces using her single point cane.     Time  8    Period  Weeks    Status  Revised      PT  LONG TERM GOAL #3   Title  The patient will report a 60% reduction in left knee pain with usual ADLs    Baseline  65-70%    Status  Achieved      PT LONG TERM GOAL #4   Title  BERG balance score will improve to 25/56 indicating improved balance with standing and walking     Status  Achieved      PT LONG TERM GOAL #5   Title  The patient will be able to get in /out of the car with minimal to no knee pain     Time  8    Period  Weeks    Status  New      Additional Long Term Goals   Additional Long Term Goals  Yes      PT LONG TERM GOAL #6   Title  The patient will report a 75-80% improvement in left knee pain with usual ADLS    Time  8    Period  Weeks    Status  New            Plan - 08/18/18 1006    Clinical Impression Statement  Patient is having more trouble with left knee pain and appears to have increased swelling in one particular area.  Patient reports increased foot slapping with walking.  Patient needs tactile cues to lift her hips for bridge.  Patient needs tactile cues to heel slides in sitting  to prevent her knee from going inward. Patient needs core strength to strengthen her LE.  Patient will benefit from continues PT to work on exercise to [employer].     Rehab Potential  Good    Clinical Impairments Affecting Rehab Potential  MS, spastic gait, fatigue, risk of falls;  lateral knee hypersensitivity     PT Frequency  2x / week    PT Duration  8 weeks    PT Treatment/Interventions  ADLs/Self Care Home Management;Iontophoresis 4mg /ml Dexamethasone;Ultrasound;Moist Heat;Cryotherapy;Electrical Stimulation;Therapeutic exercise;Therapeutic activities;Gait training;Functional mobility training;Neuromuscular re-education;Patient/family education;Manual techniques;Taping;Vasopneumatic Device    PT Next Visit Plan  core strength;   ball squeeze improves motor control with knee flexion;  try standing ex as tolerated by pain;  sees Dr. Sharol Given 8/29    PT Home  Exercise Plan  Access Code: [email or web address]     Consulted and Agree with Plan of Care  Patient       Patient will benefit from skilled therapeutic intervention in order to improve the following deficits and impairments:  Abnormal gait, Pain, Decreased balance, Decreased strength, Decreased mobility, Postural dysfunction, Decreased activity tolerance, Difficulty walking, Impaired perceived functional ability  Visit Diagnosis: Acute pain of left knee  Difficulty in walking, not elsewhere classified  Other abnormalities of gait and mobility  Other lack of coordination     Problem List Patient Active Problem List   Diagnosis Date Noted  . Excessive daytime sleepiness 10/17/2017  . Vitamin D deficiency 12/09/2016  . Attention deficit disorder 12/09/2016  . Sjogren's disease ([place]) 05/03/2016  . Other headache syndrome 09/08/2015  . Transverse myelitis ([place]) 05/06/2015  .  Dysesthesia 05/06/2015  . Neck pain 05/06/2015  . Occipital neuralgia 05/06/2015  . Spastic gait 02/05/2015  . Urinary frequency 02/05/2015  . Other fatigue 02/05/2015  . Multiple lung nodules on CT 09/24/2013  . Nipple discharge 08/04/2012  . 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 VOCAL CORD DISORDER 10/21/2009  . DYSPHAGIA, OROPHARYNGEAL PHASE 10/21/2009  . PULMONARY NODULE, RIGHT UPPER LOBE 01/22/2009  . DYSPNEA 09/24/2008  . BRONCHITIS, OBSTRUCTIVE CHRONIC, WITH EXACERBATION 09/10/2008  . G E R D 09/10/2008  . MULTIPLE SCLEROSIS 03/13/2008  . MIGRAINE, CHRONIC 03/13/2008  . CHRONIC RHINITIS 03/13/2008  . COUGH 03/13/2008    Earlie Counts, PT 08/18/18 10:13 AM   [facility] [address] [place], [place], [address] Phone: [phone]   Fax:  [phone]  Name: TURQUOISE ESCH MRN: [id number] Date of Birth: 08/27/1951

## 2018-08-21 ENCOUNTER — Ambulatory Visit: Payer: PPO | Admitting: Physical Therapy

## 2018-08-21 ENCOUNTER — Encounter: Payer: Self-pay | Admitting: Physical Therapy

## 2018-08-21 DIAGNOSIS — R2689 Other abnormalities of gait and mobility: Secondary | ICD-10-CM

## 2018-08-21 DIAGNOSIS — R278 Other lack of coordination: Secondary | ICD-10-CM

## 2018-08-21 DIAGNOSIS — M25562 Pain in left knee: Secondary | ICD-10-CM | POA: Diagnosis not present

## 2018-08-21 DIAGNOSIS — R262 Difficulty in walking, not elsewhere classified: Secondary | ICD-10-CM

## 2018-08-21 NOTE — Therapy (Signed)
[facility] [address], [place], [place], [address] Phone: [phone]   Fax:  [phone]  Physical Therapy Treatment  Patient Details  Name: [name] MRN: [id number] Date of Birth: 15-Sep-1951 Referring Provider: Dr. Crist Infante   Encounter Date: 08/21/2018  PT End of Session - 08/21/18 1138    Visit Number  14    Date for PT Re-Evaluation  10/05/18    Authorization Type  Health team    PT Start Time  1100    PT Stop Time  1140    PT Time Calculation (min)  40 min    Activity Tolerance  Patient tolerated treatment well    Behavior During Therapy  [facility] for tasks assessed/performed       Past Medical History:  Diagnosis Date  . Arthritis    back  . Dysesthesia    bilateral feet  . Family history of adverse reaction to anesthesia    mother -- ponv  . Fatigue   . Fecal incontinence   . Frequent falls    due to MS  . GERD (gastroesophageal reflux disease)   . Heart murmur   . History of colon polyps    2013;  2015  . 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 Migraines    right side   . Mitral valve prolapse    states no problem  . Multiple sclerosis [facility]) 1989   neurologist-  dr Felecia Shelling  . Neurogenic bladder   . Osteoporosis   . PONV (postoperative nausea and vomiting)   . Pulmonary nodule, right    pulmologist-  dr Milinda Hirschfeld--  stable per ct 05-17-2016  . Short of breath on exertion   . Sjogren's disease ([place])    dryness of eyes, mouth  . Spastic gait   . 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 Wears bilateral ankle braces    AFO braces for stability    Past Surgical History:  Procedure Laterality Date  . ANAL RECTAL MANOMETRY N/A 08/18/2016   Procedure: ANO RECTAL MANOMETRY;  Surgeon: Leighton Ruff, MD;  [facility]: [facility];  Service: Endoscopy;  Laterality: N/A;  . ANTERIOR CERVICAL DECOMP/DISCECTOMY FUSION  2004;    2003   C4 -- C5 (2004)/   C3 -- C4 (2003)  . APPENDECTOMY  06/22/2011   laparoscopic  . BILATERAL SALPINGOOPHORECTOMY  1982  . BLADDER SUSPENSION  1994   sling  . BREAST ENHANCEMENT SURGERY Bilateral   . BREAST IMPLANT REMOVAL Bilateral 12/03/2008  . BUNIONECTOMY    . CATARACT EXTRACTION W/ INTRAOCULAR LENS  IMPLANT, BILATERAL  2015  . ELBOW SURGERY Right   . HEMORROIDECTOMY  08/05/2010;  2013  . INSERTION SACRAL NERVE STIMULATOR TEST WIRE  09-28-2016   dr Marcello Moores  . INTERCOSTAL NERVE BLOCK  2015   occipital  . RECTAL ULTRASOUND N/A 08/18/2016   Procedure: RECTAL ULTRASOUND;  Surgeon: Leighton Ruff, MD;  [facility];  Service: Endoscopy;  Laterality: N/A;  . SHOULDER ARTHROSCOPY Left   . SHOULDER ARTHROSCOPY WITH DISTAL CLAVICLE RESECTION Right 09/21/2007   w/  Acrominoplasty,  Debridement Rotator Cuff,  CA ligament release  . TONSILLECTOMY  age 67  . TRIGGER FINGER RELEASE Left 01/16/2015   Procedure: LEFT THUMB TRIGGER RELEASE ;  Surgeon: Leanora Cover, MD;  Location: [place];  Service: Orthopedics;  Laterality: Left;  Marland Kitchen VAGINAL  HYSTERECTOMY  1981    There were no vitals filed for this visit.  Subjective Assessment - 08/21/18 1105    Subjective  Left knee is not better. When stand a long period of time the left knee swells.     Pertinent History  MS, s/p cerivcal fusion    Limitations  House hold activities;Walking    How long can you sit comfortably?  unlimited    How long can you walk comfortably?  50 yards;  uses 2 canes at times    Diagnostic tests  xrays of knee after accident     Patient Stated Goals  I want my balance to be better;  make core stronger;  make me less fatigue;  Keep going!    Currently in Pain?  Yes    Pain Score  5     Pain Location  Knee    Pain Orientation  Left    Pain Descriptors / Indicators  Aching;Sore    Pain Type  Chronic pain    Pain Onset  More than a month ago    Pain Frequency  Intermittent    Aggravating  Factors   twist knee a certain way; touching lateral knee    Pain Relieving Factors  mobilization to correct leg breath    Multiple Pain Sites  No                       [facility] Adult PT Treatment/Exercise - 08/21/18 0001      Neuro Re-ed    Neuro Re-ed Details   stand with eyes closed 10 seconds with c.g and vc to contract core; stand with feet together with VC to contract gluteals  and core holding for 30 sec and c.g      Lumbar Exercises: Supine   Bridge  10 reps;1 second   ball between knees   Other Supine Lumbar Exercises  sit to stand with ball squeeze and VC to have nose over toes      Knee/Hip Exercises: Aerobic   Nustep  level 1 sea#11, arm#9 8 minutes   while assessing patient progress     Knee/Hip Exercises: Seated   Other Seated Knee/Hip Exercises  rocker board 20x    assiting for movement; Pt. watches feet   Other Seated Knee/Hip Exercises  floor slider with ball squeeze  10x   vc to keep knee pressed into the band     Knee/Hip Exercises: Supine   Other Supine Knee/Hip Exercises  sliding board heel slides with ball between knees and green band around thighs 15x   done in sitting              PT Short Term Goals - 08/10/18 2045      PT SHORT TERM GOAL #1   Title  Pt will be demonstrate knowledge in basic self care strategies for knee pain control     Status  Achieved      PT SHORT TERM GOAL #2   Title  Left knee pain improved by 30% with basic ADLs    Status  Achieved      PT SHORT TERM GOAL #3   Title  The patient will have BERG balance score of 20/56 indicating improved safety and balance with standing and walking    Status  Achieved        PT Long Term Goals - 08/21/18 1142      PT LONG TERM GOAL #1   Title  The patient will be independent with safe self progression of HEP    Baseline  still learning    Time  8    Period  Weeks    Status  On-going      PT LONG TERM GOAL #2   Title  Pt will be able to report left knee pain  </= 3/10 when walking >/= 200 feet on community surfaces using her single point cane.     Time  8    Period  Weeks    Status  On-going      PT LONG TERM GOAL #3   Title  The patient will report a 60% reduction in left knee pain with usual ADLs    Baseline  65-70%    Time  8    Period  Weeks    Status  Achieved      PT LONG TERM GOAL #4   Title  BERG balance score will improve to 25/56 indicating improved balance with standing and walking     Time  8    Period  Weeks    Status  Achieved      PT LONG TERM GOAL #5   Title  The patient will be able to get in /out of the car with minimal to no knee pain     Time  8    Period  Weeks    Status  On-going      PT LONG TERM GOAL #6   Title  The patient will report a 75-80% improvement in left knee pain with usual ADLS    Time  8    Period  Weeks    Status  On-going            Plan - 08/21/18 1138    Clinical Impression Statement  Patient able to do heel slides in sitting with increased control on left.  Patient able to lift hips for bridge with better control. Patient needs contact guard for standing with eyes closed and verbal cues to contract the core.  Patient will hold onto therapist bringing her feet together in standing . Patient will benefit from skilled therapy to work on exercise to minimalize compensation.     Rehab Potential  Good    Clinical Impairments Affecting Rehab Potential  MS, spastic gait, fatigue, risk of falls;  lateral knee hypersensitivity     PT Frequency  2x / week    PT Duration  8 weeks    PT Treatment/Interventions  ADLs/Self Care Home Management;Iontophoresis 4mg /ml Dexamethasone;Ultrasound;Moist Heat;Cryotherapy;Electrical Stimulation;Therapeutic exercise;Therapeutic activities;Gait training;Functional mobility training;Neuromuscular re-education;Patient/family education;Manual techniques;Taping;Vasopneumatic Device    PT Next Visit Plan  core strength;   ball squeeze improves motor control with knee  flexion;  try standing ex as tolerated by pain;  sees Dr. Sharol Given 8/29; work on balance    PT Home Exercise Plan  Access Code: [email or web address]     Consulted and Agree with Plan of Care  Patient       Patient will benefit from skilled therapeutic intervention in order to improve the following deficits and impairments:  Abnormal gait, Pain, Decreased balance, Decreased strength, Decreased mobility, Postural dysfunction, Decreased activity tolerance, Difficulty walking, Impaired perceived functional ability  Visit Diagnosis: Acute pain of left knee  Difficulty in walking, not elsewhere classified  Other abnormalities of gait and mobility  Other lack of coordination     Problem List Patient Active Problem List   Diagnosis Date Noted  . Excessive daytime sleepiness 10/17/2017  .  Vitamin D deficiency 12/09/2016  . Attention deficit disorder 12/09/2016  . Sjogren's disease ([place]) 05/03/2016  . Other headache syndrome 09/08/2015  . Transverse myelitis ([place]) 05/06/2015  . Dysesthesia 05/06/2015  . Neck pain 05/06/2015  . Occipital neuralgia 05/06/2015  . Spastic gait 02/05/2015  . Urinary frequency 02/05/2015  . Other fatigue 02/05/2015  . Multiple lung nodules on CT 09/24/2013  . Nipple discharge 08/04/2012  . 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 VOCAL CORD DISORDER 10/21/2009  . DYSPHAGIA, OROPHARYNGEAL PHASE 10/21/2009  . PULMONARY NODULE, RIGHT UPPER LOBE 01/22/2009  . DYSPNEA 09/24/2008  . BRONCHITIS, OBSTRUCTIVE CHRONIC, WITH EXACERBATION 09/10/2008  . G E R D 09/10/2008  . MULTIPLE SCLEROSIS 03/13/2008  . MIGRAINE, CHRONIC 03/13/2008  . CHRONIC RHINITIS 03/13/2008  . COUGH 03/13/2008    Earlie Counts, PT 08/21/18 11:44 AM   [facility] [address] [place], [place], [address] Phone: [phone]   Fax:  [phone]  Name: HADIYAH MARICLE MRN: [id number] Date of Birth: [birth year]

## 2018-08-23 ENCOUNTER — Ambulatory Visit: Payer: PPO | Admitting: Physical Therapy

## 2018-08-23 ENCOUNTER — Other Ambulatory Visit: Payer: Self-pay | Admitting: Neurology

## 2018-08-23 ENCOUNTER — Encounter: Payer: Self-pay | Admitting: Physical Therapy

## 2018-08-23 DIAGNOSIS — R278 Other lack of coordination: Secondary | ICD-10-CM

## 2018-08-23 DIAGNOSIS — M25562 Pain in left knee: Secondary | ICD-10-CM | POA: Diagnosis not present

## 2018-08-23 DIAGNOSIS — R2689 Other abnormalities of gait and mobility: Secondary | ICD-10-CM

## 2018-08-23 DIAGNOSIS — R262 Difficulty in walking, not elsewhere classified: Secondary | ICD-10-CM

## 2018-08-23 MED ORDER — AMPHETAMINE-DEXTROAMPHET ER 25 MG PO CP24: 25.0000 mg | ORAL_CAPSULE | ORAL | 0 refills | Status: DC

## 2018-08-23 NOTE — Therapy (Signed)
[facility] [address] [place], [place], [address] Phone: [phone]   Fax:  [phone]  Physical Therapy Treatment  Patient Details  Name: [name] MRN: [id number] Date of Birth: December 16, 1951 Referring Provider: Dr. Crist Infante   Encounter Date: 08/23/2018  PT End of Session - 08/23/18 0924    Visit Number  15    Date for PT Re-Evaluation  10/05/18    Authorization Type  Health team    PT Start Time  0845    PT Stop Time  0924    PT Time Calculation (min)  39 min    Activity Tolerance  Patient tolerated treatment well    Behavior During Therapy  [facility] for tasks assessed/performed       Past Medical History:  Diagnosis Date  . Arthritis    back  . Dysesthesia    bilateral feet  . Family history of adverse reaction to anesthesia    mother -- ponv  . Fatigue   . Fecal incontinence   . Frequent falls    due to MS  . GERD (gastroesophageal reflux disease)   . Heart murmur   . History of colon polyps    2013;  2015  . 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 Migraines    right side   . Mitral valve prolapse    states no problem  . Multiple sclerosis [facility]) 1989   neurologist-  dr Felecia Shelling  . Neurogenic bladder   . Osteoporosis   . PONV (postoperative nausea and vomiting)   . Pulmonary nodule, right    pulmologist-  dr Milinda Hirschfeld--  stable per ct 05-17-2016  . Short of breath on exertion   . Sjogren's disease ([place])    dryness of eyes, mouth  . Spastic gait   . 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 Wears bilateral ankle braces    AFO braces for stability    Past Surgical History:  Procedure Laterality Date  . ANAL RECTAL MANOMETRY N/A 08/18/2016   Procedure: ANO RECTAL MANOMETRY;  Surgeon: Leighton Ruff, MD;  [facility];  Service: Endoscopy;  Laterality: N/A;  . ANTERIOR CERVICAL DECOMP/DISCECTOMY FUSION  2004;    2003   C4 -- C5 (2004)/   C3 -- C4 (2003)  . APPENDECTOMY  06/22/2011   laparoscopic  . BILATERAL SALPINGOOPHORECTOMY  1982  . BLADDER SUSPENSION  1994   sling  . BREAST ENHANCEMENT SURGERY Bilateral   . BREAST IMPLANT REMOVAL Bilateral 12/03/2008  . BUNIONECTOMY    . CATARACT EXTRACTION W/ INTRAOCULAR LENS  IMPLANT, BILATERAL  2015  . ELBOW SURGERY Right   . HEMORROIDECTOMY  08/05/2010;  2013  . INSERTION SACRAL NERVE STIMULATOR TEST WIRE  09-28-2016   dr Marcello Moores  . INTERCOSTAL NERVE BLOCK  2015   occipital  . RECTAL ULTRASOUND N/A 08/18/2016   Procedure: RECTAL ULTRASOUND;  Surgeon: Leighton Ruff, MD;  [facility];  Service: Endoscopy;  Laterality: N/A;  . SHOULDER ARTHROSCOPY Left   . SHOULDER ARTHROSCOPY WITH DISTAL CLAVICLE RESECTION Right 09/21/2007   w/  Acrominoplasty,  Debridement Rotator Cuff,  CA ligament release  . TONSILLECTOMY  age 67  . TRIGGER FINGER RELEASE Left 01/16/2015   Procedure: LEFT THUMB TRIGGER RELEASE ;  Surgeon: Leanora Cover, MD;  Location: [place];  Service: Orthopedics;  Laterality: Left;  Marland Kitchen VAGINAL  HYSTERECTOMY  1981    There were no vitals filed for this visit.  Subjective Assessment - 08/23/18 0850    Subjective  I felt better after the last visit with less soreness.     Pertinent History  MS, s/p cerivcal fusion    Limitations  House hold activities;Walking    How long can you sit comfortably?  unlimited    How long can you walk comfortably?  50 yards;  uses 2 canes at times    Diagnostic tests  xrays of knee after accident     Patient Stated Goals  I want my balance to be better;  make core stronger;  make me less fatigue;  Keep going!    Currently in Pain?  Yes    Pain Score  2     Pain Location  Knee    Pain Orientation  Left    Pain Descriptors / Indicators  Aching;Sore    Pain Type  Chronic pain    Pain Onset  More than a month ago    Aggravating Factors   twist knee a certain way ;touching lateral knee     Pain Relieving Factors  mobilization to correct leg breath    Multiple Pain Sites  No                       [facility] Adult PT Treatment/Exercise - 08/23/18 0001      Neuro Re-ed    Neuro Re-ed Details   stand with eyes closed 10 seconds with c.g and vc to contract core; stand with feet together with VC to contract gluteals  and core holding for 30 sec and c.g; stand with turning head to each side with c.g. stand with  feet togeher      Lumbar Exercises: Supine   Bridge  10 reps;1 second   ball between knees   Bridge Limitations  ball between knees    Large Ball Abdominal Isometric  10 reps;5 seconds    Large Ball Abdominal Isometric Limitations  with ball squeeze    Large Ball Oblique Isometric  20 reps;1 second    Other Supine Lumbar Exercises  sit to stand with ball squeeze and VC to have nose over toes      Knee/Hip Exercises: Aerobic   Nustep  level 1 sea#11, arm#9 8 minutes   while assessing patient progress     Knee/Hip Exercises: Seated   Other Seated Knee/Hip Exercises  floor slider with ball squeeze  10x; green band with slider 10x with VC to work on control   vc to keep knee pressed into the band              PT Short Term Goals - 08/10/18 2045      PT SHORT TERM GOAL #1   Title  Pt will be demonstrate knowledge in basic self care strategies for knee pain control     Status  Achieved      PT SHORT TERM GOAL #2   Title  Left knee pain improved by 30% with basic ADLs    Status  Achieved      PT SHORT TERM GOAL #3   Title  The patient will have BERG balance score of 20/56 indicating improved safety and balance with standing and walking    Status  Achieved        PT Long Term Goals - 08/23/18 0853      PT LONG TERM GOAL #1  Title  The patient will be independent with safe self progression of HEP    Baseline  still learning    Time  8    Period  Weeks    Status  On-going      PT LONG TERM GOAL #2   Title  Pt will be able to report left  knee pain </= 3/10 when walking >/= 200 feet on community surfaces using her single point cane.     Baseline  pain level is 4/10    Time  8    Period  Weeks    Status  On-going      PT LONG TERM GOAL #3   Title  The patient will report a 60% reduction in left knee pain with usual ADLs    Time  8    Period  Weeks    Status  Achieved      PT LONG TERM GOAL #4   Title  BERG balance score will improve to 25/56 indicating improved balance with standing and walking     Time  8    Period  Weeks    Status  Achieved      PT LONG TERM GOAL #5   Title  The patient will be able to get in /out of the car with minimal to no knee pain     Baseline  moderate pain    Time  8    Period  Weeks    Status  On-going      PT LONG TERM GOAL #6   Title  The patient will report a 75-80% improvement in left knee pain with usual ADLS    Time  8    Period  Weeks    Status  On-going            Plan - 08/23/18 8003    Clinical Impression Statement  Patient able to stand with better core control.  Patient is now able to feel her abdominals contract with ball isometric exercises. Patient walks with pain level 4/10 in left knee.  Patient had less pain after exercise.  Patient will benefit from skilled therapy to work on exercise to minimalize compensation.     Rehab Potential  Good    Clinical Impairments Affecting Rehab Potential  MS, spastic gait, fatigue, risk of falls;  lateral knee hypersensitivity     PT Frequency  2x / week    PT Duration  8 weeks    PT Treatment/Interventions  ADLs/Self Care Home Management;Iontophoresis 4mg /ml Dexamethasone;Ultrasound;Moist Heat;Cryotherapy;Electrical Stimulation;Therapeutic exercise;Therapeutic activities;Gait training;Functional mobility training;Neuromuscular re-education;Patient/family education;Manual techniques;Taping;Vasopneumatic Device    PT Next Visit Plan  core strength;   ball squeeze improves motor control with knee flexion;  try standing ex ; supine  press into mat for core  sees Dr. Sharol Given 8/29; work on balance    PT Home Exercise Plan  Access Code: [email or web address]     Consulted and Agree with Plan of Care  Patient       Patient will benefit from skilled therapeutic intervention in order to improve the following deficits and impairments:  Abnormal gait, Pain, Decreased balance, Decreased strength, Decreased mobility, Postural dysfunction, Decreased activity tolerance, Difficulty walking, Impaired perceived functional ability  Visit Diagnosis: Acute pain of left knee  Difficulty in walking, not elsewhere classified  Other abnormalities of gait and mobility  Other lack of coordination     Problem List Patient Active Problem List   Diagnosis Date Noted  . Excessive daytime sleepiness 10/17/2017  .  Vitamin D deficiency 12/09/2016  . Attention deficit disorder 12/09/2016  . Sjogren's disease ([place]) 05/03/2016  . Other headache syndrome 09/08/2015  . Transverse myelitis ([place]) 05/06/2015  . Dysesthesia 05/06/2015  . Neck pain 05/06/2015  . Occipital neuralgia 05/06/2015  . Spastic gait 02/05/2015  . Urinary frequency 02/05/2015  . Other fatigue 02/05/2015  . Multiple lung nodules on CT 09/24/2013  . Nipple discharge 08/04/2012  . 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 VOCAL CORD DISORDER 10/21/2009  . DYSPHAGIA, OROPHARYNGEAL PHASE 10/21/2009  . PULMONARY NODULE, RIGHT UPPER LOBE 01/22/2009  . DYSPNEA 09/24/2008  . BRONCHITIS, OBSTRUCTIVE CHRONIC, WITH EXACERBATION 09/10/2008  . G E R D 09/10/2008  . MULTIPLE SCLEROSIS 03/13/2008  . MIGRAINE, CHRONIC 03/13/2008  . CHRONIC RHINITIS 03/13/2008  . COUGH 03/13/2008    Earlie Counts, PT 08/23/18 9:25 AM   [facility] [address], [place], [place], [address] Phone: [phone]   Fax:  [phone]  Name: [name] MRN: [id number] Date of Birth: [birth year]

## 2018-08-23 NOTE — Telephone Encounter (Signed)
Pt requesting refills for amphetamine-dextroamphetamine (ADDERALL XR) 25 MG 24 hr capsule sent to [facility]

## 2018-08-24 ENCOUNTER — Ambulatory Visit (INDEPENDENT_AMBULATORY_CARE_PROVIDER_SITE_OTHER): Payer: PPO | Admitting: Orthopedic Surgery

## 2018-08-24 ENCOUNTER — Encounter (INDEPENDENT_AMBULATORY_CARE_PROVIDER_SITE_OTHER): Payer: Self-pay | Admitting: Orthopedic Surgery

## 2018-08-24 DIAGNOSIS — S83282S Other tear of lateral meniscus, current injury, left knee, sequela: Secondary | ICD-10-CM | POA: Diagnosis not present

## 2018-08-24 NOTE — Progress Notes (Signed)
Office Visit Note   Patient: [name]           Date of Birth: 1951/05/13           MRN: [id number] Visit Date: 08/24/2018              Requested by: Crist Infante, MD [address] [place], [place] [address] PCP: Crist Infante, MD  Chief Complaint  Patient presents with  . Left Knee - Follow-up      HPI: Patient is a 67 year old woman who presents in follow-up for her left knee.  She states she got a little bit of relief in the past few days about 4 weeks out from her injection.  Patient states she has had some episodes of her knee catching.  She complains of pain over the lateral joint line.  Patient has had a steroid injection she is also going to physical therapy for her knee and for strengthening for her MS.  Patient does have a spinal cord stimulator for urinary incontinence but states this is not working well at this time.  Assessment & Plan: Visit Diagnoses:  1. Acute tear lateral meniscus, left, sequela     Plan: Discussed that with her mechanical symptoms with failure of the injection and physical therapy the next step would be to proceed with an MRI scan to evaluate the lateral meniscus.  If we cannot proceed with a MRI scan due to her spinal cord stimulator I feel the best option would be to proceed with arthroscopic intervention for debridement the lateral meniscus due to the persistent mechanical symptoms.  Follow-Up Instructions: Return if symptoms worsen or fail to improve.   Ortho Exam  Patient is alert, oriented, no adenopathy, well-dressed, normal affect, normal respiratory effort. Patient has an antalgic gait she uses a cane.  Patient complains of mechanical catching locking and giving way with the left knee she is point tender to palpation over the lateral joint line.  Collaterals and cruciates are stable flexion and rotation reproduces pain over the lateral joint line consistent with a meniscal tear.  Imaging: No results found. No images are attached  to the encounter.  Labs: Lab Results  Component Value Date   GRAMSTAIN CYTOSPIN 11/08/2013   GRAMSTAIN No WBC Seen 11/08/2013   GRAMSTAIN No Organisms Seen 11/08/2013   LABORGA NO GROWTH 3 DAYS 11/08/2013     Lab Results  Component Value Date   ALBUMIN 4.3 09/15/2015   ALBUMIN 4.8 02/05/2015   ALBUMIN 4.3 03/20/2013    There is no height or weight on file to calculate BMI.  Orders:  No orders of the defined types were placed in this encounter.  No orders of the defined types were placed in this encounter.    Procedures: No procedures performed  Clinical Data: No additional findings.  ROS:  All other systems negative, except as noted in the HPI. Review of Systems  Objective: Vital Signs: There were no vitals taken for this visit.  Specialty Comments:  No specialty comments available.  PMFS History: Patient Active Problem List   Diagnosis Date Noted  . Excessive daytime sleepiness 10/17/2017  . Vitamin D deficiency 12/09/2016  . Attention deficit disorder 12/09/2016  . Sjogren's disease ([place]) 05/03/2016  . Other headache syndrome 09/08/2015  . Transverse myelitis ([place]) 05/06/2015  . Dysesthesia 05/06/2015  . Neck pain 05/06/2015  . Occipital neuralgia 05/06/2015  . Spastic gait 02/05/2015  . Urinary frequency 02/05/2015  . Other fatigue 02/05/2015  . Multiple lung  nodules on CT 09/24/2013  . Nipple discharge 08/04/2012  . 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 VOCAL CORD DISORDER 10/21/2009  . DYSPHAGIA, OROPHARYNGEAL PHASE 10/21/2009  . PULMONARY NODULE, RIGHT UPPER LOBE 01/22/2009  . DYSPNEA 09/24/2008  . BRONCHITIS, OBSTRUCTIVE CHRONIC, WITH EXACERBATION 09/10/2008  . G E R D 09/10/2008  . MULTIPLE SCLEROSIS 03/13/2008  . MIGRAINE, CHRONIC 03/13/2008  . CHRONIC RHINITIS 03/13/2008  . COUGH 03/13/2008   Past Medical History:  Diagnosis Date  . Arthritis    back  . Dysesthesia    bilateral feet  . Family history of adverse reaction to  anesthesia    mother -- ponv  . Fatigue   . Fecal incontinence   . Frequent falls    due to MS  . GERD (gastroesophageal reflux disease)   . Heart murmur   . History of colon polyps    2013;  2015  . 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 Migraines    right side   . Mitral valve prolapse    states no problem  . Multiple sclerosis [facility]) 1989   neurologist-  dr Felecia Shelling  . Neurogenic bladder   . Osteoporosis   . PONV (postoperative nausea and vomiting)   . Pulmonary nodule, right    pulmologist-  dr Milinda Hirschfeld--  stable per ct 05-17-2016  . Short of breath on exertion   . Sjogren's disease ([place])    dryness of eyes, mouth  . Spastic gait   . Transverse myelitis ([place])    T4  . Wears bilateral ankle braces    AFO braces for stability    Family History  Problem Relation Age of Onset  . Liver disease Father   . Cirrhosis Father   . Stroke Mother   . Cancer Mother        oropharyngeal  . Heart attack Mother   . Anesthesia problems Mother        post-op nausea  . Cancer Brother        twin; tonsillar?  . Cancer Sister 62       breast ca, lung ca  . Cancer Other        Nephew; appendiceal carcnoid, melanoma  . Cancer Paternal Grandmother        cervical    Past Surgical History:  Procedure Laterality Date  . ANAL RECTAL MANOMETRY N/A 08/18/2016   Procedure: ANO RECTAL MANOMETRY;  Surgeon: Leighton Ruff, MD;  Location: [facility];  Service: Endoscopy;  Laterality: N/A;  . ANTERIOR CERVICAL DECOMP/DISCECTOMY FUSION  2004;   2003   C4 -- C5 (2004)/   C3 -- C4 (2003)  . APPENDECTOMY  06/22/2011   laparoscopic  . BILATERAL SALPINGOOPHORECTOMY  1982  . BLADDER SUSPENSION  1994   sling  . BREAST ENHANCEMENT SURGERY Bilateral   . BREAST IMPLANT REMOVAL Bilateral 12/03/2008  . BUNIONECTOMY    . CATARACT EXTRACTION W/ INTRAOCULAR LENS  IMPLANT, BILATERAL  2015  . ELBOW SURGERY Right   . HEMORROIDECTOMY   08/05/2010;  2013  . INSERTION SACRAL NERVE STIMULATOR TEST WIRE  09-28-2016   dr Marcello Moores  . INTERCOSTAL NERVE BLOCK  2015   occipital  . RECTAL ULTRASOUND N/A 08/18/2016   Procedure: RECTAL ULTRASOUND;  Surgeon: Leighton Ruff, MD;  [facility];  Service: Endoscopy;  Laterality: N/A;  . SHOULDER ARTHROSCOPY Left   . SHOULDER ARTHROSCOPY WITH DISTAL CLAVICLE RESECTION  Right 09/21/2007   w/  Acrominoplasty,  Debridement Rotator Cuff,  CA ligament release  . TONSILLECTOMY  age 67  . TRIGGER FINGER RELEASE Left 01/16/2015   Procedure: LEFT THUMB TRIGGER RELEASE ;  Surgeon: Leanora Cover, MD;  Location: [facility];  Service: Orthopedics;  Laterality: Left;  Marland Kitchen VAGINAL HYSTERECTOMY  1981   Social History   Occupational History  . Occupation: Retired  Tobacco Use  . Smoking status: Passive Smoke Exposure - Never Smoker  . Smokeless tobacco: Never Used  . Tobacco comment: parents, husband, & multiple family members  Substance and Sexual Activity  . Alcohol use: No    Alcohol/week: 0.0 standard drinks  . Drug use: No  . Sexual activity: Not on file

## 2018-08-29 ENCOUNTER — Encounter: Payer: Self-pay | Admitting: Physical Therapy

## 2018-08-29 ENCOUNTER — Ambulatory Visit: Payer: PPO | Attending: Internal Medicine | Admitting: Physical Therapy

## 2018-08-29 DIAGNOSIS — R262 Difficulty in walking, not elsewhere classified: Secondary | ICD-10-CM | POA: Insufficient documentation

## 2018-08-29 DIAGNOSIS — R2689 Other abnormalities of gait and mobility: Secondary | ICD-10-CM

## 2018-08-29 DIAGNOSIS — M25562 Pain in left knee: Secondary | ICD-10-CM | POA: Diagnosis not present

## 2018-08-29 DIAGNOSIS — R278 Other lack of coordination: Secondary | ICD-10-CM | POA: Diagnosis not present

## 2018-08-29 NOTE — Therapy (Signed)
[facility] [address] [place], [place], [address] Phone: [phone]   Fax:  [phone]  Physical Therapy Treatment  Patient Details  Name: [name] MRN: [id number] Date of Birth: 1951/07/07 Referring Provider: Dr. Crist Infante   Encounter Date: 08/29/2018  PT End of Session - 08/29/18 0924    Visit Number  16    Date for PT Re-Evaluation  10/05/18    Authorization Type  Health team;  KX    PT Start Time  5732    PT Stop Time  0927    PT Time Calculation (min)  41 min    Activity Tolerance  Patient tolerated treatment well       Past Medical History:  Diagnosis Date  . Arthritis    back  . Dysesthesia    bilateral feet  . Family history of adverse reaction to anesthesia    mother -- ponv  . Fatigue   . Fecal incontinence   . Frequent falls    due to MS  . GERD (gastroesophageal reflux disease)   . Heart murmur   . History of colon polyps    2013;  2015  . 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 Migraines    right side   . Mitral valve prolapse    states no problem  . Multiple sclerosis [facility]) 1989   neurologist-  dr Felecia Shelling  . Neurogenic bladder   . Osteoporosis   . PONV (postoperative nausea and vomiting)   . Pulmonary nodule, right    pulmologist-  dr Milinda Hirschfeld--  stable per ct 05-17-2016  . Short of breath on exertion   . Sjogren's disease ([place])    dryness of eyes, mouth  . Spastic gait   . Transverse myelitis ([place])    T4  . Wears bilateral ankle braces    AFO braces for stability    Past Surgical History:  Procedure Laterality Date  . ANAL RECTAL MANOMETRY N/A 08/18/2016   Procedure: ANO RECTAL MANOMETRY;  Surgeon: Leighton Ruff, MD;  [facility];  Service: Endoscopy;  Laterality: N/A;  . ANTERIOR CERVICAL DECOMP/DISCECTOMY FUSION  2004;   2003   C4 -- C5 (2004)/   C3 -- C4 (2003)  . APPENDECTOMY   06/22/2011   laparoscopic  . BILATERAL SALPINGOOPHORECTOMY  1982  . BLADDER SUSPENSION  1994   sling  . BREAST ENHANCEMENT SURGERY Bilateral   . BREAST IMPLANT REMOVAL Bilateral 12/03/2008  . BUNIONECTOMY    . CATARACT EXTRACTION W/ INTRAOCULAR LENS  IMPLANT, BILATERAL  2015  . ELBOW SURGERY Right   . HEMORROIDECTOMY  08/05/2010;  2013  . INSERTION SACRAL NERVE STIMULATOR TEST WIRE  09-28-2016   dr Marcello Moores  . INTERCOSTAL NERVE BLOCK  2015   occipital  . RECTAL ULTRASOUND N/A 08/18/2016   Procedure: RECTAL ULTRASOUND;  Surgeon: Leighton Ruff, MD;  [facility];  Service: Endoscopy;  Laterality: N/A;  . SHOULDER ARTHROSCOPY Left   . SHOULDER ARTHROSCOPY WITH DISTAL CLAVICLE RESECTION Right 09/21/2007   w/  Acrominoplasty,  Debridement Rotator Cuff,  CA ligament release  . TONSILLECTOMY  age 13  . TRIGGER FINGER RELEASE Left 01/16/2015   Procedure: LEFT THUMB TRIGGER RELEASE ;  Surgeon: Leanora Cover, MD;  Location: [facility];  Service: Orthopedics;  Laterality: Left;  Marland Kitchen VAGINAL HYSTERECTOMY  1981    There were no  vitals filed for this visit.  Subjective Assessment - 08/29/18 0843    Subjective  She's checking to see if bladder stimulator can be changed in order to have an MRI .  I was in the pool yesterday and that felt so good to move however I wanted    Pertinent History  MS, s/p cerivcal fusion   "[name]"     Currently in Pain?  Yes    Pain Score  2     Pain Location  Knee    Pain Orientation  Left    Pain Type  Chronic pain    Aggravating Factors   being on my feet                       [facility] Adult PT Treatment/Exercise - 08/29/18 0001      Neuro Re-ed    Neuro Re-ed Details   gluteal squeeze, lateral weight shifting, staggered stand weight shifting left and right   needs bil UE support from therapist with staggered stand     Lumbar Exercises: Supine   Bridge  10 reps;1 second   ball between knees   Bridge Limitations  ball between  knees    Large Ball Abdominal Isometric  10 reps;5 seconds    Large Ball Abdominal Isometric Limitations  with ball squeeze    Large Ball Oblique Isometric  20 reps;1 second    Other Supine Lumbar Exercises  beach ball thigh roll up 8x   green band and ball between knees     Knee/Hip Exercises: Aerobic   Nustep  level 1 seat #10, arm#11 8 minutes   while assessing patient progress     Knee/Hip Exercises: Standing   Other Standing Knee Exercises  sit to stand from BOSU on mat 5x light CGA from PT       Knee/Hip Exercises: Seated   Other Seated Knee/Hip Exercises  floor slider with ball squeeze  10x; green band with slider 10x with VC to work on control   vc to keep knee pressed into the band     Knee/Hip Exercises: Supine   Other Supine Knee/Hip Exercises  sliding board heel slides with ball between knees and green band around thighs 15x   done in sitting              PT Short Term Goals - 08/10/18 2045      PT SHORT TERM GOAL #1   Title  Pt will be demonstrate knowledge in basic self care strategies for knee pain control     Status  Achieved      PT SHORT TERM GOAL #2   Title  Left knee pain improved by 30% with basic ADLs    Status  Achieved      PT SHORT TERM GOAL #3   Title  The patient will have BERG balance score of 20/56 indicating improved safety and balance with standing and walking    Status  Achieved        PT Long Term Goals - 08/23/18 0853      PT LONG TERM GOAL #1   Title  The patient will be independent with safe self progression of HEP    Baseline  still learning    Time  8    Period  Weeks    Status  On-going      PT LONG TERM GOAL #2   Title  Pt will be able to report left knee pain </=  3/10 when walking >/= 200 feet on community surfaces using her single point cane.     Baseline  pain level is 4/10    Time  8    Period  Weeks    Status  On-going      PT LONG TERM GOAL #3   Title  The patient will report a 60% reduction in left knee  pain with usual ADLs    Time  8    Period  Weeks    Status  Achieved      PT LONG TERM GOAL #4   Title  BERG balance score will improve to 25/56 indicating improved balance with standing and walking     Time  8    Period  Weeks    Status  Achieved      PT LONG TERM GOAL #5   Title  The patient will be able to get in /out of the car with minimal to no knee pain     Baseline  moderate pain    Time  8    Period  Weeks    Status  On-going      PT LONG TERM GOAL #6   Title  The patient will report a 75-80% improvement in left knee pain with usual ADLS    Time  8    Period  Weeks    Status  On-going            Plan - 08/29/18 6270    Clinical Impression Statement  The patient has an increase in lateral knee swelling.  She continues to have hypersensitivity and burning type pain but overall the pain intensity stays low.  Progress has been impacted by MS affecting her motor control.  Therapist closely monitoring knee pain and modifying as needed.  Also providing close supervision for safety in standing.      Rehab Potential  Good    Clinical Impairments Affecting Rehab Potential  MS, spastic gait, fatigue, risk of falls;  lateral knee hypersensitivity     PT Frequency  2x / week    PT Duration  8 weeks    PT Treatment/Interventions  ADLs/Self Care Home Management;Iontophoresis 4mg /ml Dexamethasone;Ultrasound;Moist Heat;Cryotherapy;Electrical Stimulation;Therapeutic exercise;Therapeutic activities;Gait training;Functional mobility training;Neuromuscular re-education;Patient/family education;Manual techniques;Taping;Vasopneumatic Device    PT Next Visit Plan  core strength;   ball squeeze improves motor control with knee flexion;  try standing ex; work on balance       Patient will benefit from skilled therapeutic intervention in order to improve the following deficits and impairments:  Abnormal gait, Pain, Decreased balance, Decreased strength, Decreased mobility, Postural  dysfunction, Decreased activity tolerance, Difficulty walking, Impaired perceived functional ability  Visit Diagnosis: Acute pain of left knee  Difficulty in walking, not elsewhere classified  Other abnormalities of gait and mobility     Problem List Patient Active Problem List   Diagnosis Date Noted  . Excessive daytime sleepiness 10/17/2017  . Vitamin D deficiency 12/09/2016  . Attention deficit disorder 12/09/2016  . Sjogren's disease ([place]) 05/03/2016  . Other headache syndrome 09/08/2015  . Transverse myelitis ([place]) 05/06/2015  . Dysesthesia 05/06/2015  . Neck pain 05/06/2015  . Occipital neuralgia 05/06/2015  . Spastic gait 02/05/2015  . Urinary frequency 02/05/2015  . Other fatigue 02/05/2015  . Multiple lung nodules on CT 09/24/2013  . Nipple discharge 08/04/2012  . 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 VOCAL CORD DISORDER 10/21/2009  . DYSPHAGIA, OROPHARYNGEAL PHASE 10/21/2009  . PULMONARY NODULE, RIGHT UPPER LOBE 01/22/2009  .  DYSPNEA 09/24/2008  . BRONCHITIS, OBSTRUCTIVE CHRONIC, WITH EXACERBATION 09/10/2008  . G E R D 09/10/2008  . MULTIPLE SCLEROSIS 03/13/2008  . MIGRAINE, CHRONIC 03/13/2008  . CHRONIC RHINITIS 03/13/2008  . COUGH 03/13/2008   Ruben Im, PT 08/29/18 3:50 PM Phone: [phone] Fax: [phone]  Alvera Singh 08/29/2018, 3:50 PM  [facility] [address] [place], [place], [address] Phone: [phone]   Fax:  [phone]  Name: [name] MRN: [id number] Date of Birth: 1951-05-18

## 2018-08-31 ENCOUNTER — Encounter: Payer: Self-pay | Admitting: Physical Therapy

## 2018-08-31 ENCOUNTER — Ambulatory Visit: Payer: PPO | Admitting: Physical Therapy

## 2018-08-31 DIAGNOSIS — M25562 Pain in left knee: Secondary | ICD-10-CM

## 2018-08-31 DIAGNOSIS — R2689 Other abnormalities of gait and mobility: Secondary | ICD-10-CM

## 2018-08-31 DIAGNOSIS — R262 Difficulty in walking, not elsewhere classified: Secondary | ICD-10-CM

## 2018-08-31 NOTE — Therapy (Addendum)
[facility] [address] [place], [place], [address] Phone: [phone]   Fax:  [phone]  Physical Therapy Treatment  Patient Details  Name: [name] MRN: [id number] Date of Birth: [birth year] Referring Provider: Dr. Crist Infante   Encounter Date: 08/31/2018  PT End of Session - 08/31/18 1423    Visit Number  17    Date for PT Re-Evaluation  10/05/18    Authorization Type  Health team;  KX now    PT Start Time  0853    PT Stop Time  0931    PT Time Calculation (min)  38 min    Activity Tolerance  Patient tolerated treatment well       Past Medical History:  Diagnosis Date  . Arthritis    back  . Dysesthesia    bilateral feet  . Family history of adverse reaction to anesthesia    mother -- ponv  . Fatigue   . Fecal incontinence   . Frequent falls    due to MS  . GERD (gastroesophageal reflux disease)   . Heart murmur   . History of colon polyps    2013;  2015  . 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 Migraines    right side   . Mitral valve prolapse    states no problem  . Multiple sclerosis [facility]) 1989   neurologist-  dr Felecia Shelling  . Neurogenic bladder   . Osteoporosis   . PONV (postoperative nausea and vomiting)   . Pulmonary nodule, right    pulmologist-  dr Milinda Hirschfeld--  stable per ct 05-17-2016  . Short of breath on exertion   . Sjogren's disease ([place])    dryness of eyes, mouth  . Spastic gait   . 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 Wears bilateral ankle braces    AFO braces for stability    Past Surgical History:  Procedure Laterality Date  . ANAL RECTAL MANOMETRY N/A 08/18/2016   Procedure: ANO RECTAL MANOMETRY;  Surgeon: Leighton Ruff, MD;  Location: [facility];  Service: Endoscopy;  Laterality: N/A;  . ANTERIOR CERVICAL DECOMP/DISCECTOMY FUSION  2004;   2003   C4 -- C5 (2004)/   C3 -- C4 (2003)  .  APPENDECTOMY  06/22/2011   laparoscopic  . BILATERAL SALPINGOOPHORECTOMY  1982  . BLADDER SUSPENSION  1994   sling  . BREAST ENHANCEMENT SURGERY Bilateral   . BREAST IMPLANT REMOVAL Bilateral 12/03/2008  . BUNIONECTOMY    . CATARACT EXTRACTION W/ INTRAOCULAR LENS  IMPLANT, BILATERAL  2015  . ELBOW SURGERY Right   . HEMORROIDECTOMY  08/05/2010;  2013  . INSERTION SACRAL NERVE STIMULATOR TEST WIRE  09-28-2016   dr Marcello Moores  . INTERCOSTAL NERVE BLOCK  2015   occipital  . RECTAL ULTRASOUND N/A 08/18/2016   Procedure: RECTAL ULTRASOUND;  Surgeon: Leighton Ruff, MD;  [facility];  Service: Endoscopy;  Laterality: N/A;  . SHOULDER ARTHROSCOPY Left   . SHOULDER ARTHROSCOPY WITH DISTAL CLAVICLE RESECTION Right 09/21/2007   w/  Acrominoplasty,  Debridement Rotator Cuff,  CA ligament release  . TONSILLECTOMY  age 64  . TRIGGER FINGER RELEASE Left 01/16/2015   Procedure: LEFT THUMB TRIGGER RELEASE ;  Surgeon: Leanora Cover, MD;  Location: [facility];  Service: Orthopedics;  Laterality: Left;  Marland Kitchen VAGINAL HYSTERECTOMY  1981    There were  no vitals filed for this visit.  Subjective Assessment - 08/31/18 0855    Subjective  My knee is not hurting as badly today.  It hurt a lot on Labor Day b/c I as up on it a lot.      Pertinent History  MS, s/p cerivcal fusion   "[name]"     Currently in Pain?  Yes    Pain Score  3     Pain Location  Knee    Pain Orientation  Left    Pain Type  Chronic pain                       [facility] Adult PT Treatment/Exercise - 08/31/18 0001      Neuro Re-ed    Neuro Re-ed Details   standing weight shifting while moving UE Ranger on floor forward and back;  stagger standing with moving UE Ranger to the side       Lumbar Exercises: Seated   Other Seated Lumbar Exercises  sit to stand from BOSU       Lumbar Exercises: Supine   Bridge  10 reps;1 second   ball between knees   Bridge Limitations  ball between knees    Other Supine  Lumbar Exercises  single leg and double green band isometric clams 5x each     Other Supine Lumbar Exercises  propped on wege with hand to opposite thigh reach 10x       Knee/Hip Exercises: Aerobic   Nustep  level 1 seat #10, arm#11 8 minutes   while assessing patient progress     Knee/Hip Exercises: Standing   Other Standing Knee Exercises  sit to stand from BOSU on mat 5x light CGA from PT       Knee/Hip Exercises: Seated   Other Seated Knee/Hip Exercises  floor slider with ball squeeze 20x on left and right                PT Short Term Goals - 08/10/18 2045      PT SHORT TERM GOAL #1   Title  Pt will be demonstrate knowledge in basic self care strategies for knee pain control     Status  Achieved      PT SHORT TERM GOAL #2   Title  Left knee pain improved by 30% with basic ADLs    Status  Achieved      PT SHORT TERM GOAL #3   Title  The patient will have BERG balance score of 20/56 indicating improved safety and balance with standing and walking    Status  Achieved        PT Long Term Goals - 08/23/18 0853      PT LONG TERM GOAL #1   Title  The patient will be independent with safe self progression of HEP    Baseline  still learning    Time  8    Period  Weeks    Status  On-going      PT LONG TERM GOAL #2   Title  Pt will be able to report left knee pain </= 3/10 when walking >/= 200 feet on community surfaces using her single point cane.     Baseline  pain level is 4/10    Time  8    Period  Weeks    Status  On-going      PT LONG TERM GOAL #3   Title  The patient will report a  60% reduction in left knee pain with usual ADLs    Time  8    Period  Weeks    Status  Achieved      PT LONG TERM GOAL #4   Title  BERG balance score will improve to 25/56 indicating improved balance with standing and walking     Time  8    Period  Weeks    Status  Achieved      PT LONG TERM GOAL #5   Title  The patient will be able to get in /out of the car with minimal  to no knee pain     Baseline  moderate pain    Time  8    Period  Weeks    Status  On-going      PT LONG TERM GOAL #6   Title  The patient will report a 75-80% improvement in left knee pain with usual ADLS    Time  8    Period  Weeks    Status  On-going            Plan - 08/31/18 1424    Clinical Impression Statement  The patient continues to have lateral knee edema.   Pain is moderate throughout treatment session but patient remains highly motivated to continue as much ROM and strength as possible needed to remain functional.  Her MS is a significant factor that has affected and slowed her progress.  Therapist closely monitoring pain response.     Rehab Potential  Good    Clinical Impairments Affecting Rehab Potential  MS, spastic gait, fatigue, risk of falls;  lateral knee hypersensitivity     PT Frequency  2x / week    PT Duration  8 weeks    PT Treatment/Interventions  ADLs/Self Care Home Management;Iontophoresis 4mg /ml Dexamethasone;Ultrasound;Moist Heat;Cryotherapy;Electrical Stimulation;Therapeutic exercise;Therapeutic activities;Gait training;Functional mobility training;Neuromuscular re-education;Patient/family education;Manual techniques;Taping;Vasopneumatic Device    PT Next Visit Plan  core strength;   ball squeeze improves motor control with knee flexion;  try standing ex; work on balance    PT Home Exercise Plan  Access Code: [email or web address]        Patient will benefit from skilled therapeutic intervention in order to improve the following deficits and impairments:  Abnormal gait, Pain, Decreased balance, Decreased strength, Decreased mobility, Postural dysfunction, Decreased activity tolerance, Difficulty walking, Impaired perceived functional ability  Visit Diagnosis: Acute pain of left knee  Difficulty in walking, not elsewhere classified  Other abnormalities of gait and mobility     Problem List Patient Active Problem List   Diagnosis Date Noted  . Excessive  daytime sleepiness 10/17/2017  . Vitamin D deficiency 12/09/2016  . Attention deficit disorder 12/09/2016  . Sjogren's disease ([place]) 05/03/2016  . Other headache syndrome 09/08/2015  . Transverse myelitis ([place]) 05/06/2015  . Dysesthesia 05/06/2015  . Neck pain 05/06/2015  . Occipital neuralgia 05/06/2015  . Spastic gait 02/05/2015  . Urinary frequency 02/05/2015  . Other fatigue 02/05/2015  . Multiple lung nodules on CT 09/24/2013  . Nipple discharge 08/04/2012  . 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 VOCAL CORD DISORDER 10/21/2009  . DYSPHAGIA, OROPHARYNGEAL PHASE 10/21/2009  . PULMONARY NODULE, RIGHT UPPER LOBE 01/22/2009  . DYSPNEA 09/24/2008  . BRONCHITIS, OBSTRUCTIVE CHRONIC, WITH EXACERBATION 09/10/2008  . G E R D 09/10/2008  . MULTIPLE SCLEROSIS 03/13/2008  . MIGRAINE, CHRONIC 03/13/2008  . CHRONIC RHINITIS 03/13/2008  . COUGH 03/13/2008   Ruben Im, PT 08/31/18 2:28 PM Phone: [phone] Fax: [phone]  Moshe Cipro,  Yves Dill 08/31/2018, 2:27 PM  [facility] [address] [place], [place], [address] Phone: [phone]   Fax:  [phone]  Name: [name] MRN: [id number] Date of Birth: May 30, 1951

## 2018-09-05 ENCOUNTER — Encounter: Payer: Self-pay | Admitting: Physical Therapy

## 2018-09-05 ENCOUNTER — Ambulatory Visit: Payer: PPO | Admitting: Physical Therapy

## 2018-09-05 DIAGNOSIS — R2689 Other abnormalities of gait and mobility: Secondary | ICD-10-CM

## 2018-09-05 DIAGNOSIS — M25562 Pain in left knee: Secondary | ICD-10-CM

## 2018-09-05 DIAGNOSIS — R262 Difficulty in walking, not elsewhere classified: Secondary | ICD-10-CM

## 2018-09-05 NOTE — Therapy (Signed)
[name] [address] [place], [place], [address] Phone: [phone]   Fax:  [phone]  Physical Therapy Treatment  Patient Details  Name: [name] MRN: [id number] Date of Birth: 1951-03-22 Referring Provider: Dr. Crist Infante   Encounter Date: 09/05/2018  PT End of Session - 09/05/18 0853    Visit Number  18    Date for PT Re-Evaluation  10/05/18    Authorization Type  Health team;  KX now    PT Start Time  0842    PT Stop Time  0930    PT Time Calculation (min)  48 min    Activity Tolerance  Patient tolerated treatment well       Past Medical History:  Diagnosis Date  . Arthritis    back  . Dysesthesia    bilateral feet  . Family history of adverse reaction to anesthesia    mother -- ponv  . Fatigue   . Fecal incontinence   . Frequent falls    due to MS  . GERD (gastroesophageal reflux disease)   . Heart murmur   . History of colon polyps    2013;  2015  . 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 Migraines    right side   . Mitral valve prolapse    states no problem  . Multiple sclerosis [facility]) 1989   neurologist-  dr Felecia Shelling  . Neurogenic bladder   . Osteoporosis   . PONV (postoperative nausea and vomiting)   . Pulmonary nodule, right    pulmologist-  dr Milinda Hirschfeld--  stable per ct 05-17-2016  . Short of breath on exertion   . Sjogren's disease ([place])    dryness of eyes, mouth  . Spastic gait   . 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 Wears bilateral ankle braces    AFO braces for stability    Past Surgical History:  Procedure Laterality Date  . ANAL RECTAL MANOMETRY N/A 08/18/2016   Procedure: ANO RECTAL MANOMETRY;  Surgeon: Leighton Ruff, MD;  [facility];  Service: Endoscopy;  Laterality: N/A;  . ANTERIOR CERVICAL DECOMP/DISCECTOMY FUSION  2004;   2003   C4 -- C5 (2004)/   C3 -- C4 (2003)  .  APPENDECTOMY  06/22/2011   laparoscopic  . BILATERAL SALPINGOOPHORECTOMY  1982  . BLADDER SUSPENSION  1994   sling  . BREAST ENHANCEMENT SURGERY Bilateral   . BREAST IMPLANT REMOVAL Bilateral 12/03/2008  . BUNIONECTOMY    . CATARACT EXTRACTION W/ INTRAOCULAR LENS  IMPLANT, BILATERAL  2015  . ELBOW SURGERY Right   . HEMORROIDECTOMY  08/05/2010;  2013  . INSERTION SACRAL NERVE STIMULATOR TEST WIRE  09-28-2016   dr Marcello Moores  . INTERCOSTAL NERVE BLOCK  2015   occipital  . RECTAL ULTRASOUND N/A 08/18/2016   Procedure: RECTAL ULTRASOUND;  Surgeon: Leighton Ruff, MD;  [facility];  Service: Endoscopy;  Laterality: N/A;  . SHOULDER ARTHROSCOPY Left   . SHOULDER ARTHROSCOPY WITH DISTAL CLAVICLE RESECTION Right 09/21/2007   w/  Acrominoplasty,  Debridement Rotator Cuff,  CA ligament release  . TONSILLECTOMY  age 46  . TRIGGER FINGER RELEASE Left 01/16/2015   Procedure: LEFT THUMB TRIGGER RELEASE ;  Surgeon: Leanora Cover, MD;  Location: [facility];  Service: Orthopedics;  Laterality: Left;  Marland Kitchen VAGINAL HYSTERECTOMY  1981    There were  no vitals filed for this visit.  Subjective Assessment - 09/05/18 0845    Subjective  All of me is good except for my knee.  Yesterday was very painful and today is starting out that way.  Still trying to work out the bladder implant and getting an MRI.      Pertinent History  MS, s/p cerivcal fusion   "[name]"     Patient Stated Goals  I want my balance to be better;  make core stronger;  make me less fatigue;  Keep going!    Currently in Pain?  Yes    Pain Score  4     Pain Location  Knee    Pain Orientation  Left    Pain Type  Chronic pain    Aggravating Factors   being on my feet                       [facility] Adult PT Treatment/Exercise - 09/05/18 0001      Lumbar Exercises: Supine   Ab Set  10 reps;5 seconds    Bridge  10 reps;1 second   ball between knees   Bridge Limitations  ball between knees    Large Ball  Abdominal Isometric  10 reps;5 seconds    Large Ball Oblique Isometric  10 reps      Knee/Hip Exercises: Aerobic   Nustep  level 1 seat #10, arm#11 10 minutes   while assessing patient progress     Knee/Hip Exercises: Seated   Other Seated Knee/Hip Exercises  UE Ranger V formation 15x    Other Seated Knee/Hip Exercises  floor slider with ball squeeze 20x on left and right                PT Short Term Goals - 09/05/18 0854      PT SHORT TERM GOAL #1   Title  Pt will be demonstrate knowledge in basic self care strategies for knee pain control     Status  Achieved      PT SHORT TERM GOAL #2   Title  Left knee pain improved by 30% with basic ADLs    Status  Achieved      PT SHORT TERM GOAL #3   Title  The patient will have BERG balance score of 20/56 indicating improved safety and balance with standing and walking    Status  Achieved        PT Long Term Goals - 09/05/18 0854      PT LONG TERM GOAL #1   Title  The patient will be independent with safe self progression of HEP    Time  8    Period  Weeks    Status  On-going      PT LONG TERM GOAL #2   Title  Pt will be able to report left knee pain </= 3/10 when walking >/= 200 feet on community surfaces using her single point cane.     Time  8    Period  Weeks    Status  On-going      PT LONG TERM GOAL #3   Title  The patient will report a 60% reduction in left knee pain with usual ADLs    Status  Achieved      PT LONG TERM GOAL #4   Title  BERG balance score will improve to 25/56 indicating improved balance with standing and walking     Status  Achieved  PT LONG TERM GOAL #5   Title  The patient will be able to get in /out of the car with minimal to no knee pain     Time  8    Period  Weeks    Status  On-going            Plan - 09/05/18 0854    Clinical Impression Statement  Lateral left knee continues to be very painful and therefore modified treatment to focus more on lumbo/pelvic/hip core  strengthening in non-weight bearing position.  Avoided excessive knee ROM and standing ex's today since this increases her knee pain.  Modifications also needed secondary to extremity weakness associated with MS.      Rehab Potential  Good    Clinical Impairments Affecting Rehab Potential  MS, spastic gait, fatigue, risk of falls;  lateral knee hypersensitivity     PT Frequency  2x / week    PT Duration  8 weeks    PT Treatment/Interventions  ADLs/Self Care Home Management;Iontophoresis 4mg /ml Dexamethasone;Ultrasound;Moist Heat;Cryotherapy;Electrical Stimulation;Therapeutic exercise;Therapeutic activities;Gait training;Functional mobility training;Neuromuscular re-education;Patient/family education;Manual techniques;Taping;Vasopneumatic Device    PT Next Visit Plan  core strength;   ball squeeze improves motor control with knee flexion;  try standing ex; work on balance;  KX now    PT Home Exercise Plan  Access Code: [email or web address]        Patient will benefit from skilled therapeutic intervention in order to improve the following deficits and impairments:  Abnormal gait, Pain, Decreased balance, Decreased strength, Decreased mobility, Postural dysfunction, Decreased activity tolerance, Difficulty walking, Impaired perceived functional ability  Visit Diagnosis: Acute pain of left knee  Difficulty in walking, not elsewhere classified  Other abnormalities of gait and mobility     Problem List Patient Active Problem List   Diagnosis Date Noted  . Excessive daytime sleepiness 10/17/2017  . Vitamin D deficiency 12/09/2016  . Attention deficit disorder 12/09/2016  . Sjogren's disease ([place]) 05/03/2016  . Other headache syndrome 09/08/2015  . Transverse myelitis ([place]) 05/06/2015  . Dysesthesia 05/06/2015  . Neck pain 05/06/2015  . Occipital neuralgia 05/06/2015  . Spastic gait 02/05/2015  . Urinary frequency 02/05/2015  . Other fatigue 02/05/2015  . Multiple lung nodules on CT 09/24/2013  .  Nipple discharge 08/04/2012  . 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 VOCAL CORD DISORDER 10/21/2009  . DYSPHAGIA, OROPHARYNGEAL PHASE 10/21/2009  . PULMONARY NODULE, RIGHT UPPER LOBE 01/22/2009  . DYSPNEA 09/24/2008  . BRONCHITIS, OBSTRUCTIVE CHRONIC, WITH EXACERBATION 09/10/2008  . G E R D 09/10/2008  . MULTIPLE SCLEROSIS 03/13/2008  . MIGRAINE, CHRONIC 03/13/2008  . CHRONIC RHINITIS 03/13/2008  . COUGH 03/13/2008   Ruben Im, PT 09/05/18 3:09 PM Phone: [phone] Fax: [phone]  [name] 09/05/2018, 3:09 PM  [facility] [address] [place], [place], [address] Phone: [phone]   Fax:  [phone]  Name: [name] MRN: [id number] Date of Birth: 1951-10-07

## 2018-09-07 ENCOUNTER — Encounter: Payer: PPO | Admitting: Physical Therapy

## 2018-09-08 ENCOUNTER — Telehealth (INDEPENDENT_AMBULATORY_CARE_PROVIDER_SITE_OTHER): Payer: Self-pay | Admitting: Orthopedic Surgery

## 2018-09-08 NOTE — Telephone Encounter (Signed)
Patient said that due to a bowel stimulator she is unable to have any MRI from her head down. She spoke with the surgeon and she seemed very hesitant about any other options and said that there was no way it could be temporarily removed. What other options does patient have? # [phone]

## 2018-09-08 NOTE — Telephone Encounter (Signed)
Please advise thanks.

## 2018-09-11 ENCOUNTER — Encounter: Payer: Self-pay | Admitting: Physical Therapy

## 2018-09-11 ENCOUNTER — Ambulatory Visit: Payer: PPO | Admitting: Physical Therapy

## 2018-09-11 DIAGNOSIS — M25562 Pain in left knee: Secondary | ICD-10-CM | POA: Diagnosis not present

## 2018-09-11 DIAGNOSIS — R2689 Other abnormalities of gait and mobility: Secondary | ICD-10-CM

## 2018-09-11 DIAGNOSIS — R262 Difficulty in walking, not elsewhere classified: Secondary | ICD-10-CM

## 2018-09-11 DIAGNOSIS — R278 Other lack of coordination: Secondary | ICD-10-CM

## 2018-09-11 NOTE — Telephone Encounter (Signed)
Call patient since she cannot have an MRI scan we could proceed with arthroscopic surgery for debridement of her knee at the orthopedic surgical center as an outpatient.

## 2018-09-11 NOTE — Therapy (Signed)
[facility] [address], [place], [place], [address] Phone: [phone]   Fax:  [phone]  Physical Therapy Treatment  Patient Details  Name: [name] MRN: [id number] Date of Birth: [birth year] Referring Provider: Dr. Crist Infante   Encounter Date: 09/11/2018  PT End of Session - 09/11/18 1104    Visit Number  19    Date for PT Re-Evaluation  10/05/18    Authorization Type  Health team;  KX now    PT Start Time  1105    PT Stop Time  1145    PT Time Calculation (min)  40 min    Activity Tolerance  Patient tolerated treatment well    Behavior During Therapy  [facility] for tasks assessed/performed       Past Medical History:  Diagnosis Date  . Arthritis    back  . Dysesthesia    bilateral feet  . Family history of adverse reaction to anesthesia    mother -- ponv  . Fatigue   . Fecal incontinence   . Frequent falls    due to MS  . GERD (gastroesophageal reflux disease)   . Heart murmur   . History of colon polyps    2013;  2015  . 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 Migraines    right side   . Mitral valve prolapse    states no problem  . Multiple sclerosis [facility]) 1989   neurologist-  dr Felecia Shelling  . Neurogenic bladder   . Osteoporosis   . PONV (postoperative nausea and vomiting)   . Pulmonary nodule, right    pulmologist-  dr Milinda Hirschfeld--  stable per ct 05-17-2016  . Short of breath on exertion   . Sjogren's disease ([place])    dryness of eyes, mouth  . Spastic gait   . Transverse myelitis ([place])    T4  . Wears bilateral ankle braces    AFO braces for stability    Past Surgical History:  Procedure Laterality Date  . ANAL RECTAL MANOMETRY N/A 08/18/2016   Procedure: ANO RECTAL MANOMETRY;  Surgeon: Leighton Ruff, MD;  Location: [facility];  Service: Endoscopy;  Laterality: N/A;  . ANTERIOR CERVICAL DECOMP/DISCECTOMY FUSION   2004;   2003   C4 -- C5 (2004)/   C3 -- C4 (2003)  . APPENDECTOMY  06/22/2011   laparoscopic  . BILATERAL SALPINGOOPHORECTOMY  1982  . BLADDER SUSPENSION  1994   sling  . BREAST ENHANCEMENT SURGERY Bilateral   . BREAST IMPLANT REMOVAL Bilateral 12/03/2008  . BUNIONECTOMY    . CATARACT EXTRACTION W/ INTRAOCULAR LENS  IMPLANT, BILATERAL  2015  . ELBOW SURGERY Right   . HEMORROIDECTOMY  08/05/2010;  2013  . INSERTION SACRAL NERVE STIMULATOR TEST WIRE  09-28-2016   dr Marcello Moores  . INTERCOSTAL NERVE BLOCK  2015   occipital  . RECTAL ULTRASOUND N/A 08/18/2016   Procedure: RECTAL ULTRASOUND;  Surgeon: Leighton Ruff, MD;  [facility];  Service: Endoscopy;  Laterality: N/A;  . SHOULDER ARTHROSCOPY Left   . SHOULDER ARTHROSCOPY WITH DISTAL CLAVICLE RESECTION Right 09/21/2007   w/  Acrominoplasty,  Debridement Rotator Cuff,  CA ligament release  . TONSILLECTOMY  age 80  . TRIGGER FINGER RELEASE Left 01/16/2015   Procedure: LEFT THUMB TRIGGER RELEASE ;  Surgeon: Leanora Cover, MD;  Location: [place];  Service: Orthopedics;  Laterality: Left;  .  VAGINAL HYSTERECTOMY  1981    There were no vitals filed for this visit.  Subjective Assessment - 09/11/18 1114    Subjective  My knee is hurting alot and getting worse.  Patient reports she is not able to have a MRI. I feel more confident with my balance.  I am more aware of the right things to do correctly.     Pertinent History  MS, s/p cerivcal fusion   "[name]"     Limitations  House hold activities;Walking    How long can you sit comfortably?  unlimited    How long can you walk comfortably?  50 yards;  uses 2 canes at times    Diagnostic tests  xrays of knee after accident     Patient Stated Goals  I want my balance to be better;  make core stronger;  make me less fatigue;  Keep going!    Currently in Pain?  Yes    Pain Score  5     Pain Location  Knee    Pain Orientation  Left    Pain Descriptors / Indicators   Aching;Sore    Pain Type  Chronic pain    Pain Onset  More than a month ago    Pain Frequency  Constant    Aggravating Factors   being on my feet, move a certain way    Pain Relieving Factors  mobilization to correct leg position    Multiple Pain Sites  No         [facility] PT Assessment - 09/11/18 0001      Strength   Left Ankle Dorsiflexion  3-/5    Left Ankle Plantar Flexion  3-/5    Left Ankle Inversion  3+/5    Left Ankle Eversion  3+/5                   [facility] Adult PT Treatment/Exercise - 09/11/18 0001      Lumbar Exercises: Supine   Bridge  10 reps;5 seconds   ball between knees   Bridge Limitations  ball between knees; lift 7 cm from mat    Large Ball Abdominal Isometric  10 reps;5 seconds    Large Ball Oblique Isometric  10 reps;5 seconds   VC on technique; able to feel abdominals     Knee/Hip Exercises: Aerobic   Nustep  level 1 seat #10, arm#11 10 minutes   while assessing patient progress     Knee/Hip Exercises: Seated   Other Seated Knee/Hip Exercises  UE Ranger V formation 15x   working on postural muscles, sitting, press into it   Other Seated Knee/Hip Exercises  floor slider with ball squeeze 20x on left and right    therapist guiding the foot              PT Short Term Goals - 09/05/18 0854      PT SHORT TERM GOAL #1   Title  Pt will be demonstrate knowledge in basic self care strategies for knee pain control     Status  Achieved      PT SHORT TERM GOAL #2   Title  Left knee pain improved by 30% with basic ADLs    Status  Achieved      PT SHORT TERM GOAL #3   Title  The patient will have BERG balance score of 20/56 indicating improved safety and balance with standing and walking    Status  Achieved  PT Long Term Goals - 09/11/18 1152      PT LONG TERM GOAL #1   Title  The patient will be independent with safe self progression of HEP    Baseline  still learning    Time  8    Period  Weeks    Status  On-going       PT LONG TERM GOAL #2   Title  Pt will be able to report left knee pain </= 3/10 when walking >/= 200 feet on community surfaces using her single point cane.     Baseline  pain level is 4/10    Time  8    Period  Weeks    Status  On-going      PT LONG TERM GOAL #3   Title  The patient will report a 60% reduction in left knee pain with usual ADLs    Baseline  65-70%    Time  8    Status  Achieved      PT LONG TERM GOAL #4   Title  BERG balance score will improve to 25/56 indicating improved balance with standing and walking     Time  8    Period  Weeks    Status  Achieved      PT LONG TERM GOAL #5   Title  The patient will be able to get in /out of the car with minimal to no knee pain     Baseline  moderate pain    Time  8    Period  Weeks    Status  On-going      PT LONG TERM GOAL #6   Title  The patient will report a 75-80% improvement in left knee pain with usual ADLS    Time  8    Period  Weeks    Status  On-going            Plan - 09/11/18 1104    Clinical Impression Statement  Patient has increased strength of left ankle inversion and eversion.  Patient has difficulty with left ankle dorsiflexion due to her decreased in feeling in the left foot and the MS.  Patient is now able to feel her abdominals contract when she is performing abdominal exercises. Patient left knee pain is progressively increasing and is now constant.  Patient exercises are modified due to left knee pain and are more in non-weightbearing.  Patient needs modifications due to extremity weakness associated with MS.     Clinical Impairments Affecting Rehab Potential  MS, spastic gait, fatigue, risk of falls;  lateral knee hypersensitivity     PT Frequency  2x / week    PT Duration  8 weeks    PT Treatment/Interventions  ADLs/Self Care Home Management;Iontophoresis 4mg /ml Dexamethasone;Ultrasound;Moist Heat;Cryotherapy;Electrical Stimulation;Therapeutic exercise;Therapeutic activities;Gait  training;Functional mobility training;Neuromuscular re-education;Patient/family education;Manual techniques;Taping;Vasopneumatic Device    PT Next Visit Plan  core strength;   ball squeeze improves motor control with knee flexion;  try standing ex; work on balance;  KX now    PT Home Exercise Plan  Access Code: [email or web address]     Consulted and Agree with Plan of Care  Patient       Patient will benefit from skilled therapeutic intervention in order to improve the following deficits and impairments:  Abnormal gait, Pain, Decreased balance, Decreased strength, Decreased mobility, Postural dysfunction, Decreased activity tolerance, Difficulty walking, Impaired perceived functional ability  Visit Diagnosis: Acute pain of left knee  Difficulty in walking, not  elsewhere classified  Other abnormalities of gait and mobility  Other lack of coordination     Problem List Patient Active Problem List   Diagnosis Date Noted  . Excessive daytime sleepiness 10/17/2017  . Vitamin D deficiency 12/09/2016  . Attention deficit disorder 12/09/2016  . Sjogren's disease ([place]) 05/03/2016  . Other headache syndrome 09/08/2015  . Transverse myelitis ([place]) 05/06/2015  . Dysesthesia 05/06/2015  . Neck pain 05/06/2015  . Occipital neuralgia 05/06/2015  . Spastic gait 02/05/2015  . Urinary frequency 02/05/2015  . Other fatigue 02/05/2015  . Multiple lung nodules on CT 09/24/2013  . Nipple discharge 08/04/2012  . 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 VOCAL CORD DISORDER 10/21/2009  . DYSPHAGIA, OROPHARYNGEAL PHASE 10/21/2009  . PULMONARY NODULE, RIGHT UPPER LOBE 01/22/2009  . DYSPNEA 09/24/2008  . BRONCHITIS, OBSTRUCTIVE CHRONIC, WITH EXACERBATION 09/10/2008  . G E R D 09/10/2008  . MULTIPLE SCLEROSIS 03/13/2008  . MIGRAINE, CHRONIC 03/13/2008  . CHRONIC RHINITIS 03/13/2008  . COUGH 03/13/2008    Earlie Counts, PT 09/11/18 11:53 AM   [facility] [address] [place], [place], [address] Phone: [phone]   Fax:  [phone]  Name: [name] MRN: [id number] Date of Birth: Sep 27, 1951

## 2018-09-13 ENCOUNTER — Encounter: Payer: Self-pay | Admitting: Physical Therapy

## 2018-09-13 ENCOUNTER — Ambulatory Visit: Payer: PPO | Admitting: Physical Therapy

## 2018-09-13 DIAGNOSIS — R278 Other lack of coordination: Secondary | ICD-10-CM

## 2018-09-13 DIAGNOSIS — M25562 Pain in left knee: Secondary | ICD-10-CM

## 2018-09-13 DIAGNOSIS — R2689 Other abnormalities of gait and mobility: Secondary | ICD-10-CM

## 2018-09-13 DIAGNOSIS — R262 Difficulty in walking, not elsewhere classified: Secondary | ICD-10-CM

## 2018-09-13 NOTE — Therapy (Addendum)
[facility] [address] [place], [place], [address] Phone: [phone]   Fax:  [phone]  Physical Therapy Treatment  Patient Details  Name: [name] MRN: [id number] Date of Birth: June 23, 1951 Referring Provider: Dr. Crist Infante  Progress Note Reporting Period 08/08/2018 to 09/13/2018  See note below for Objective Data and Assessment of Progress/Goals.      Encounter Date: 09/13/2018  PT End of Session - 09/13/18 1003    Visit Number  20    Date for PT Re-Evaluation  10/05/18    Authorization Type  Health team;  [place] now    PT Start Time  0930    PT Stop Time  1010    PT Time Calculation (min)  40 min    Activity Tolerance  Patient tolerated treatment well    Behavior During Therapy  [facility] for tasks assessed/performed       Past Medical History:  Diagnosis Date  . Arthritis    back  . Dysesthesia    bilateral feet  . Family history of adverse reaction to anesthesia    mother -- ponv  . Fatigue   . Fecal incontinence   . Frequent falls    due to MS  . GERD (gastroesophageal reflux disease)   . Heart murmur   . History of colon polyps    2013;  2015  . 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 Migraines    right side   . Mitral valve prolapse    states no problem  . Multiple sclerosis [facility]) 1989   neurologist-  dr Felecia Shelling  . Neurogenic bladder   . Osteoporosis   . PONV (postoperative nausea and vomiting)   . Pulmonary nodule, right    pulmologist-  dr Milinda Hirschfeld--  stable per ct 05-17-2016  . Short of breath on exertion   . Sjogren's disease ([place])    dryness of eyes, mouth  . Spastic gait   . 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 Wears bilateral ankle braces    AFO braces for stability    Past Surgical History:  Procedure Laterality Date  . ANAL RECTAL MANOMETRY N/A 08/18/2016   Procedure: ANO RECTAL MANOMETRY;   Surgeon: Leighton Ruff, MD;  [facility];  Service: Endoscopy;  Laterality: N/A;  . ANTERIOR CERVICAL DECOMP/DISCECTOMY FUSION  2004;   2003   C4 -- C5 (2004)/   C3 -- C4 (2003)  . APPENDECTOMY  06/22/2011   laparoscopic  . BILATERAL SALPINGOOPHORECTOMY  1982  . BLADDER SUSPENSION  1994   sling  . BREAST ENHANCEMENT SURGERY Bilateral   . BREAST IMPLANT REMOVAL Bilateral 12/03/2008  . BUNIONECTOMY    . CATARACT EXTRACTION W/ INTRAOCULAR LENS  IMPLANT, BILATERAL  2015  . ELBOW SURGERY Right   . HEMORROIDECTOMY  08/05/2010;  2013  . INSERTION SACRAL NERVE STIMULATOR TEST WIRE  09-28-2016   dr Marcello Moores  . INTERCOSTAL NERVE BLOCK  2015   occipital  . RECTAL ULTRASOUND N/A 08/18/2016   Procedure: RECTAL ULTRASOUND;  Surgeon: Leighton Ruff, MD;  [facility];  Service: Endoscopy;  Laterality: N/A;  . SHOULDER ARTHROSCOPY Left   . SHOULDER ARTHROSCOPY WITH DISTAL CLAVICLE RESECTION Right 09/21/2007   w/  Acrominoplasty,  Debridement Rotator Cuff,  CA ligament release  . TONSILLECTOMY  age 39  . TRIGGER FINGER RELEASE Left 01/16/2015   Procedure:  LEFT THUMB TRIGGER RELEASE ;  Surgeon: Leanora Cover, MD;  Location: [place];  Service: Orthopedics;  Laterality: Left;  [place] VAGINAL HYSTERECTOMY  1981    There were no vitals filed for this visit.  Subjective Assessment - 09/13/18 0935    Subjective  My knee is buckling more. The left knee is now popping.     Pertinent History  MS, s/p cerivcal fusion   "[name]"     Limitations  House hold activities;Walking    How long can you sit comfortably?  unlimited    How long can you walk comfortably?  50 yards;  uses 2 canes at times    Diagnostic tests  xrays of knee after accident     Patient Stated Goals  I want my balance to be better;  make core stronger;  make me less fatigue;  Keep going!    Currently in Pain?  Yes    Pain Score  5     Pain Location  Knee    Pain Orientation  Left    Pain Descriptors / Indicators   Aching;Sore    Pain Type  Chronic pain    Pain Onset  More than a month ago    Pain Frequency  Constant    Aggravating Factors   being on my feet, move a certain way    Pain Relieving Factors  mobilization to correct leg position    Multiple Pain Sites  No         [facility] PT Assessment - 09/13/18 0001      Assessment   Medical Diagnosis  gait imbalance     Referring Provider  Dr. Crist Infante    Onset Date/Surgical Date  03/24/18    Prior Therapy  yes in past for MS and [facility] for knee pain       Precautions   Precautions  Fall      Restrictions   Weight Bearing Restrictions  No      [place] residence      Prior Function   Level of Independence  Independent with household mobility with device      Cognition   Overall Cognitive Status  Within Functional Limits for tasks assessed      Observation/Other Assessments   Focus on Therapeutic Outcomes (FOTO)   74% limitation      Posture/Postural Control   Posture/Postural Control  No significant limitations      ROM / Strength   AROM / PROM / Strength  AROM;PROM;Strength      Strength   Right Hip Flexion  3/5    Right Knee Flexion  2+/5    Right Knee Extension  2+/5    Left Knee Flexion  2+/5    Left Knee Extension  2+/5    Right Ankle Dorsiflexion  3-/5    Right Ankle Plantar Flexion  3-/5    Right Ankle Inversion  3-/5    Right Ankle Eversion  3-/5    Left Ankle Dorsiflexion  3-/5    Left Ankle Plantar Flexion  3-/5    Left Ankle Inversion  3+/5    Left Ankle Eversion  3+/5      Ambulation/Gait   Ambulation/Gait  Yes    Assistive device  Straight cane    Gait Pattern  Right genu recurvatum;Left genu recurvatum;Poor foot clearance - left;Poor foot clearance - right      Oceanographer Test   Sit  to Stand  Able to stand  independently using hands    Standing Unsupported  Able to stand 2 minutes with supervision    Sitting with Back Unsupported but Feet Supported on  Floor or Stool  Able to sit safely and securely 2 minutes    Stand to Sit  Controls descent by using hands    Transfers  Able to transfer safely, definite need of hands    Standing Unsupported with Eyes Closed  Able to stand 3 seconds    Standing Ubsupported with Feet Together  Able to place feet together independently but unable to hold for 30 seconds    From Standing, Reach Forward with Outstretched Arm  Reaches forward but needs supervision    From Standing Position, Pick up Object from Floor  Unable to try/needs assist to keep balance    From Standing Position, Turn to Look Behind Over each Shoulder  Turn sideways only but maintains balance    Turn 360 Degrees  Needs close supervision or verbal cueing    Standing Unsupported, Alternately Place Feet on Step/Stool  Needs assistance to keep from falling or unable to try    Standing Unsupported, One Foot in Front  Needs help to step but can hold 15 seconds    Standing on One Leg  Unable to try or needs assist to prevent fall    Total Score  25                   [facility] Adult PT Treatment/Exercise - 09/13/18 0001      Lumbar Exercises: Supine   Bridge  10 reps;5 seconds   ball between knees   Bridge Limitations  ball between knees; lift 7 cm from mat    Large Ball Abdominal Isometric  10 reps;5 seconds   with ball squeeze between knees.      Knee/Hip Exercises: Aerobic   Nustep  level 1 seat #10, arm#11 10 minutes   while assessing patient progress     Knee/Hip Exercises: Seated   Other Seated Knee/Hip Exercises  UE Ranger V formation 15x   working on postural muscles, sitting, press into it   Other Seated Knee/Hip Exercises  floor slider with ball squeeze 20x on left and right    therapist guiding the foot              PT Short Term Goals - 09/05/18 0854      PT SHORT TERM GOAL #1   Title  Pt will be demonstrate knowledge in basic self care strategies for knee pain control     Status  Achieved      PT SHORT  TERM GOAL #2   Title  Left knee pain improved by 30% with basic ADLs    Status  Achieved      PT SHORT TERM GOAL #3   Title  The patient will have BERG balance score of 20/56 indicating improved safety and balance with standing and walking    Status  Achieved        PT Long Term Goals - 09/13/18 1013      PT LONG TERM GOAL #1   Title  The patient will be independent with safe self progression of HEP    Baseline  still learning    Time  8    Period  Weeks    Status  On-going      PT LONG TERM GOAL #2   Title  Pt will be able to  report left knee pain </= 3/10 when walking >/= 200 feet on community surfaces using her single point cane.     Baseline  pain level is 4/10    Time  8    Period  Weeks    Status  On-going      PT LONG TERM GOAL #5   Title  The patient will be able to get in /out of the car with minimal to no knee pain     Baseline  moderate pain    Time  8    Period  Weeks    Status  On-going      PT LONG TERM GOAL #6   Title  The patient will report a 75-80% improvement in left knee pain with usual ADLS    Time  8    Period  Weeks    Status  On-going            Plan - 09/13/18 1004    Clinical Impression Statement  Patient continues to have weakness and decreased coordination in bilateral lower extremities. Patient BERG balance score is 25/56.   Patient needs helped with leg exericse due to decresaed coordination from the MS. Patient is having difficulty with her balance due to her left knee buckling now and the pain is increasing.  Patient has a popping in left knee.  Patient has to have C. G with standing activities due to left knee pain.  Patient exercises are modified due to left knee painand are more non-weightbearing.  Patient needs mocifications due to extremity weakness associated with MS.     Rehab Potential  Good    Clinical Impairments Affecting Rehab Potential  MS, spastic gait, fatigue, risk of falls;  lateral knee hypersensitivity     PT  Frequency  2x / week    PT Duration  8 weeks    PT Treatment/Interventions  ADLs/Self Care Home Management;Iontophoresis 96m/ml Dexamethasone;Ultrasound;Moist Heat;Cryotherapy;Electrical Stimulation;Therapeutic exercise;Therapeutic activities;Gait training;Functional mobility training;Neuromuscular re-education;Patient/family education;Manual techniques;Taping;Vasopneumatic Device    PT Next Visit Plan  core strength;   ball squeeze improves motor control with knee flexion;  try standing ex; work on balance;  KX now; Patient will be seeing the ortopedist on 9/26 about her knee so she is being on hold for 1 week.     PT Home Exercise Plan  Access Code: [email or web address]    Consulted and Agree with Plan of Care  Patient       Patient will benefit from skilled therapeutic intervention in order to improve the following deficits and impairments:  Abnormal gait, Pain, Decreased balance, Decreased strength, Decreased mobility, Postural dysfunction, Decreased activity tolerance, Difficulty walking, Impaired perceived functional ability  Visit Diagnosis: Acute pain of left knee  Difficulty in walking, not elsewhere classified  Other abnormalities of gait and mobility  Other lack of coordination     Problem List Patient Active Problem List   Diagnosis Date Noted  . Excessive daytime sleepiness 10/17/2017  . Vitamin D deficiency 12/09/2016  . Attention deficit disorder 12/09/2016  . S[place]ogren's disease ([place] 05/03/2016  . Other headache syndrome 09/08/2015  . Transverse myelitis ([place] 05/06/2015  . Dysesthesia 05/06/2015  . Neck pain 05/06/2015  . Occipital neuralgia 05/06/2015  . Spastic gait 02/05/2015  . Urinary frequency 02/05/2015  . Other fatigue 02/05/2015  . Multiple lung nodules on CT 09/24/2013  . Nipple discharge 08/04/2012  . 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 VOCAL CORD DISORDER 10/21/2009  . DYSPHAGIA, OROPHARYNGEAL PHASE 10/21/2009  .  PULMONARY NODULE, RIGHT UPPER LOBE 01/22/2009   . DYSPNEA 09/24/2008  . BRONCHITIS, OBSTRUCTIVE CHRONIC, WITH EXACERBATION 09/10/2008  . G E R D 09/10/2008  . MULTIPLE SCLEROSIS 03/13/2008  . MIGRAINE, CHRONIC 03/13/2008  . CHRONIC RHINITIS 03/13/2008  . COUGH 03/13/2008    Earlie Counts, PT 09/13/18 10:16 AM   [facility] [address] [place], [place], [address] Phone: [phone]   Fax:  [phone]  Name: [name] MRN: [id number] Date of Birth: April 22, 1951  PHYSICAL THERAPY DISCHARGE SUMMARY  Visits from Start of Care: 20  Current functional level related to goals / functional outcomes: See above.    Remaining deficits: See above. Patient had left knee arthroscopic surgery since last visit and has not returned.    Education / Equipment: See above.  Plan:                                                    Patient goals were not met. Patient is being discharged due to a change in medical status. Thank you for the referral. Earlie Counts, PT 10/17/18 11:06 AM   ?????

## 2018-09-13 NOTE — Telephone Encounter (Signed)
I spoke with [name].  She would like to proceed with surgery.  She is scheduled at the [facility] for Tuesday 09/26/2018. All surgery information given to patient.

## 2018-09-13 NOTE — Telephone Encounter (Signed)
I called [name] to discuss.  She did not answer and her voicemail is full so I could not leave a message. Will call again to attempt to reach patient.

## 2018-09-18 ENCOUNTER — Telehealth (INDEPENDENT_AMBULATORY_CARE_PROVIDER_SITE_OTHER): Payer: Self-pay | Admitting: Orthopedic Surgery

## 2018-09-18 NOTE — Telephone Encounter (Signed)
Patient wants a call from [place] office as to whether or not she needs to come to the 09/21/18 appt or not prior to surgery. Pt called in to make this appt and I advised NOT an appt made from the office.  Please call to advise patient as she is having surgery on 10/1(?) [phone]

## 2018-09-19 ENCOUNTER — Telehealth (INDEPENDENT_AMBULATORY_CARE_PROVIDER_SITE_OTHER): Payer: Self-pay | Admitting: Orthopedic Surgery

## 2018-09-19 ENCOUNTER — Encounter: Payer: PPO | Admitting: Physical Therapy

## 2018-09-19 NOTE — Telephone Encounter (Signed)
Can you please advise. Thanks. Surgery scheduled for 10/01

## 2018-09-19 NOTE — Telephone Encounter (Signed)
Patient called asked if she need to keep the appointment scheduled 09/21/18 at 10:15am. Patient said she is having surgery 09/26/18. The number to contact patient is [phone]

## 2018-09-19 NOTE — Telephone Encounter (Signed)
See other note. Waiting to be advised by Dr Sharol Given.

## 2018-09-19 NOTE — Telephone Encounter (Signed)
IC no answer. Left detailed VM advising per Dr Sharol Given.

## 2018-09-19 NOTE — Telephone Encounter (Signed)
Only need to see 1 week after surgery, do not need to see before surgery

## 2018-09-21 ENCOUNTER — Ambulatory Visit (INDEPENDENT_AMBULATORY_CARE_PROVIDER_SITE_OTHER): Payer: PPO | Admitting: Orthopedic Surgery

## 2018-09-21 ENCOUNTER — Encounter: Payer: PPO | Admitting: Physical Therapy

## 2018-09-25 ENCOUNTER — Encounter: Payer: PPO | Admitting: Physical Therapy

## 2018-09-26 ENCOUNTER — Encounter: Payer: Self-pay | Admitting: Orthopedic Surgery

## 2018-09-26 ENCOUNTER — Telehealth (INDEPENDENT_AMBULATORY_CARE_PROVIDER_SITE_OTHER): Payer: Self-pay | Admitting: Orthopedic Surgery

## 2018-09-26 ENCOUNTER — Other Ambulatory Visit (INDEPENDENT_AMBULATORY_CARE_PROVIDER_SITE_OTHER): Payer: Self-pay | Admitting: Physician Assistant

## 2018-09-26 DIAGNOSIS — M1712 Unilateral primary osteoarthritis, left knee: Secondary | ICD-10-CM | POA: Diagnosis not present

## 2018-09-26 DIAGNOSIS — M659 Synovitis and tenosynovitis, unspecified: Secondary | ICD-10-CM | POA: Diagnosis not present

## 2018-09-26 DIAGNOSIS — M23242 Derangement of anterior horn of lateral meniscus due to old tear or injury, left knee: Secondary | ICD-10-CM | POA: Diagnosis not present

## 2018-09-26 DIAGNOSIS — S83262A Peripheral tear of lateral meniscus, current injury, left knee, initial encounter: Secondary | ICD-10-CM | POA: Diagnosis not present

## 2018-09-26 DIAGNOSIS — G8918 Other acute postprocedural pain: Secondary | ICD-10-CM | POA: Diagnosis not present

## 2018-09-26 HISTORY — PX: MENISCUS REPAIR: SHX5179

## 2018-09-26 MED ORDER — KETOROLAC TROMETHAMINE 10 MG PO TABS: 10.0000 mg | ORAL_TABLET | Freq: Three times a day (TID) | ORAL | 0 refills | Status: DC | PRN

## 2018-09-26 NOTE — Telephone Encounter (Signed)
I called and sw pt to advise that per Shawn that rx for Toradol has been faxed to [facility]. Advised to use ice, elevation compression and to call with questions. Voiced understanding and satisfied with plan.

## 2018-09-26 NOTE — Telephone Encounter (Signed)
Patient had surgery this morning and she states she was given Hydrocodone and she has taken 2 and it's not touching the pain. Patient's call back # [phone]

## 2018-09-26 NOTE — Telephone Encounter (Signed)
Pt is s/p a left knee scope and debridement today and states that she has taken 2 of her vicodin and that it is not working at all.

## 2018-09-27 ENCOUNTER — Encounter: Payer: PPO | Admitting: Physical Therapy

## 2018-09-28 ENCOUNTER — Other Ambulatory Visit: Payer: Self-pay | Admitting: Neurology

## 2018-09-28 MED ORDER — AMPHETAMINE-DEXTROAMPHET ER 25 MG PO CP24
25.0000 mg | ORAL_CAPSULE | ORAL | 0 refills | Status: DC
Start: 2018-09-28 — End: 2018-10-19

## 2018-09-28 NOTE — Telephone Encounter (Signed)
Pt requesting refills for amphetamine-dextroamphetamine (ADDERALL XR) 25 MG 24 hr capsule sent to [facility]

## 2018-10-02 ENCOUNTER — Encounter: Payer: PPO | Admitting: Physical Therapy

## 2018-10-03 ENCOUNTER — Encounter (INDEPENDENT_AMBULATORY_CARE_PROVIDER_SITE_OTHER): Payer: Self-pay | Admitting: Orthopedic Surgery

## 2018-10-03 ENCOUNTER — Ambulatory Visit (INDEPENDENT_AMBULATORY_CARE_PROVIDER_SITE_OTHER): Payer: PPO | Admitting: Orthopedic Surgery

## 2018-10-03 VITALS — Ht 70.0 in | Wt 135.0 lb

## 2018-10-03 DIAGNOSIS — S83282S Other tear of lateral meniscus, current injury, left knee, sequela: Secondary | ICD-10-CM

## 2018-10-03 NOTE — Progress Notes (Signed)
Office Visit Note   Patient: [name]           Date of Birth: August 03, 1951           MRN: [id number] Visit Date: 10/03/2018              Requested by: Crist Infante, MD [address] [place],  [address] PCP: Crist Infante, MD  Chief Complaint  Patient presents with  . Left Knee - Routine Post Op    09/26/18 left knee scope and debridement       HPI: Patient is a 67 year old woman who presents for follow-up status post debridement left knee arthroscopy with debridement of osteochondral defect as well as meniscal tear.  Patient states she still has weakness from her MS.  Assessment & Plan: Visit Diagnoses:  1. Acute tear lateral meniscus, left, sequela     Plan: Recommended starting with isometric straight leg raises to try to start regaining strength once she has started to progress with the strength training on her own we will set her up with physical therapy.  Sutures harvested today  Follow-Up Instructions: Return in about 2 weeks (around 10/17/2018).   Ortho Exam  Patient is alert, oriented, no adenopathy, well-dressed, normal affect, normal respiratory effort. Examination patient has some mild redness over the anterior aspect of the patella.  There is no pain with range of motion there is no effusion she does have pain with weightbearing.  Sutures are harvested there is no drainage.  Imaging: No results found. No images are attached to the encounter.  Labs: Lab Results  Component Value Date   GRAMSTAIN CYTOSPIN 11/08/2013   GRAMSTAIN No WBC Seen 11/08/2013   GRAMSTAIN No Organisms Seen 11/08/2013   LABORGA NO GROWTH 3 DAYS 11/08/2013     Lab Results  Component Value Date   ALBUMIN 4.3 09/15/2015   ALBUMIN 4.8 02/05/2015   ALBUMIN 4.3 03/20/2013    Body mass index is 19.37 kg/m.  Orders:  No orders of the defined types were placed in this encounter.  No orders of the defined types were placed in this encounter.    Procedures: No  procedures performed  Clinical Data: No additional findings.  ROS:  All other systems negative, except as noted in the HPI. Review of Systems  Objective: Vital Signs: Ht 5\' 10"  (1.778 m)   Wt 135 lb (61.2 kg)   BMI 19.37 kg/m   Specialty Comments:  No specialty comments available.  PMFS History: Patient Active Problem List   Diagnosis Date Noted  . Excessive daytime sleepiness 10/17/2017  . Vitamin D deficiency 12/09/2016  . Attention deficit disorder 12/09/2016  . Sjogren's disease ([place]) 05/03/2016  . Other headache syndrome 09/08/2015  . Transverse myelitis ([place]) 05/06/2015  . Dysesthesia 05/06/2015  . Neck pain 05/06/2015  . Occipital neuralgia 05/06/2015  . Spastic gait 02/05/2015  . Urinary frequency 02/05/2015  . Other fatigue 02/05/2015  . Multiple lung nodules on CT 09/24/2013  . Nipple discharge 08/04/2012  . 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 VOCAL CORD DISORDER 10/21/2009  . DYSPHAGIA, OROPHARYNGEAL PHASE 10/21/2009  . PULMONARY NODULE, RIGHT UPPER LOBE 01/22/2009  . DYSPNEA 09/24/2008  . BRONCHITIS, OBSTRUCTIVE CHRONIC, WITH EXACERBATION 09/10/2008  . G E R D 09/10/2008  . MULTIPLE SCLEROSIS 03/13/2008  . MIGRAINE, CHRONIC 03/13/2008  . CHRONIC RHINITIS 03/13/2008  . COUGH 03/13/2008   Past Medical History:  Diagnosis Date  . Arthritis    back  . Dysesthesia    bilateral  feet  . Family history of adverse reaction to anesthesia    mother -- ponv  . Fatigue   . Fecal incontinence   . Frequent falls    due to MS  . GERD (gastroesophageal reflux disease)   . Heart murmur   . History of colon polyps    2013;  2015  . 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 Migraines    right side   . Mitral valve prolapse    states no problem  . Multiple sclerosis [facility]) 1989   neurologist-  dr Felecia Shelling  . Neurogenic bladder   . Osteoporosis   . PONV (postoperative nausea and vomiting)    . Pulmonary nodule, right    pulmologist-  dr Milinda Hirschfeld--  stable per ct 05-17-2016  . Short of breath on exertion   . Sjogren's disease ([place])    dryness of eyes, mouth  . Spastic gait   . Transverse myelitis ([place])    T4  . Wears bilateral ankle braces    AFO braces for stability    Family History  Problem Relation Age of Onset  . Liver disease Father   . Cirrhosis Father   . Stroke Mother   . Cancer Mother        oropharyngeal  . Heart attack Mother   . Anesthesia problems Mother        post-op nausea  . Cancer Brother        twin; tonsillar?  . Cancer Sister 89       breast ca, lung ca  . Cancer Other        Nephew; appendiceal carcnoid, melanoma  . Cancer Paternal Grandmother        cervical    Past Surgical History:  Procedure Laterality Date  . ANAL RECTAL MANOMETRY N/A 08/18/2016   Procedure: ANO RECTAL MANOMETRY;  Surgeon: Leighton Ruff, MD;  Location: [facility];  Service: Endoscopy;  Laterality: N/A;  . ANTERIOR CERVICAL DECOMP/DISCECTOMY FUSION  2004;   2003   C4 -- C5 (2004)/   C3 -- C4 (2003)  . APPENDECTOMY  06/22/2011   laparoscopic  . BILATERAL SALPINGOOPHORECTOMY  1982  . BLADDER SUSPENSION  1994   sling  . BREAST ENHANCEMENT SURGERY Bilateral   . BREAST IMPLANT REMOVAL Bilateral 12/03/2008  . BUNIONECTOMY    . CATARACT EXTRACTION W/ INTRAOCULAR LENS  IMPLANT, BILATERAL  2015  . ELBOW SURGERY Right   . HEMORROIDECTOMY  08/05/2010;  2013  . INSERTION SACRAL NERVE STIMULATOR TEST WIRE  09-28-2016   dr Marcello Moores  . INTERCOSTAL NERVE BLOCK  2015   occipital  . RECTAL ULTRASOUND N/A 08/18/2016   Procedure: RECTAL ULTRASOUND;  Surgeon: Leighton Ruff, MD;  Location: [facility];  Service: Endoscopy;  Laterality: N/A;  . SHOULDER ARTHROSCOPY Left   . SHOULDER ARTHROSCOPY WITH DISTAL CLAVICLE RESECTION Right 09/21/2007   w/  Acrominoplasty,  Debridement Rotator Cuff,  CA ligament release  . TONSILLECTOMY  age 17  . TRIGGER FINGER RELEASE Left 01/16/2015    Procedure: LEFT THUMB TRIGGER RELEASE ;  Surgeon: Leanora Cover, MD;  Location: [facility];  Service: Orthopedics;  Laterality: Left;  Marland Kitchen VAGINAL HYSTERECTOMY  1981   Social History   Occupational History  . Occupation: Retired  Tobacco Use  . Smoking status: Passive Smoke Exposure - Never Smoker  . Smokeless tobacco: Never Used  . Tobacco comment: parents,  husband, & multiple family members  Substance and Sexual Activity  . Alcohol use: No    Alcohol/week: 0.0 standard drinks  . Drug use: No  . Sexual activity: Not on file

## 2018-10-04 ENCOUNTER — Ambulatory Visit: Payer: Self-pay | Admitting: Physical Therapy

## 2018-10-18 ENCOUNTER — Ambulatory Visit (INDEPENDENT_AMBULATORY_CARE_PROVIDER_SITE_OTHER): Payer: PPO | Admitting: Orthopedic Surgery

## 2018-10-18 ENCOUNTER — Ambulatory Visit (INDEPENDENT_AMBULATORY_CARE_PROVIDER_SITE_OTHER): Payer: PPO

## 2018-10-18 ENCOUNTER — Encounter (INDEPENDENT_AMBULATORY_CARE_PROVIDER_SITE_OTHER): Payer: Self-pay | Admitting: Orthopedic Surgery

## 2018-10-18 VITALS — Ht 70.0 in | Wt 135.0 lb

## 2018-10-18 DIAGNOSIS — M25562 Pain in left knee: Secondary | ICD-10-CM

## 2018-10-18 DIAGNOSIS — S83282S Other tear of lateral meniscus, current injury, left knee, sequela: Secondary | ICD-10-CM

## 2018-10-18 NOTE — Progress Notes (Signed)
Office Visit Note   Patient: [name]           Date of Birth: 1951-07-23           MRN: [id number] Visit Date: 10/18/2018              Requested by: Crist Infante, MD [address] [place], [place] [address] PCP: Crist Infante, MD  Chief Complaint  Patient presents with  . Left Knee - Follow-up    Fell on Left Knee 2 wks ago"      HPI: Patient is a 67 year old woman who states that she fell landed on her left hip and then struck her lateral aspect of her left knee she states that she is been having increasing pain she states while sitting her pain is a 3 or 4/10 and while standing it is a 6/10.  She states she is been having some spasms difficulty with sleeping and stiffness in the back of her knee.  Assessment & Plan: Visit Diagnoses:  1. Acute pain of left knee   2. Acute tear lateral meniscus, left, sequela     Plan: Recommended Aleve 2 p.o. twice daily ice elevation continue quad VMO strengthening and increasing activities.  Discussed that if this does not help her symptoms would follow-up with a steroid injection.  Follow-Up Instructions: Return in about 4 weeks (around 11/15/2018).   Ortho Exam  Patient is alert, oriented, no adenopathy, well-dressed, normal affect, normal respiratory effort. Examination patient has an antalgic gait she uses a cane.  She has global tenderness to palpation around the left knee there is a mild effusion collaterals and cruciates are stable there is no redness no cellulitis no bruising.  Patient has an intact extensor mechanism.  Imaging: No results found. No images are attached to the encounter.  Labs: Lab Results  Component Value Date   GRAMSTAIN CYTOSPIN 11/08/2013   GRAMSTAIN No WBC Seen 11/08/2013   GRAMSTAIN No Organisms Seen 11/08/2013   LABORGA NO GROWTH 3 DAYS 11/08/2013     Lab Results  Component Value Date   ALBUMIN 4.3 09/15/2015   ALBUMIN 4.8 02/05/2015   ALBUMIN 4.3 03/20/2013    Body mass index is  19.37 kg/m.  Orders:  Orders Placed This Encounter  Procedures  . XR Knee 1-2 Views Left   No orders of the defined types were placed in this encounter.    Procedures: No procedures performed  Clinical Data: No additional findings.  ROS:  All other systems negative, except as noted in the HPI. Review of Systems  Objective: Vital Signs: Ht 5\' 10"  (1.778 m)   Wt 135 lb (61.2 kg)   BMI 19.37 kg/m   Specialty Comments:  No specialty comments available.  PMFS History: Patient Active Problem List   Diagnosis Date Noted  . Excessive daytime sleepiness 10/17/2017  . Vitamin D deficiency 12/09/2016  . Attention deficit disorder 12/09/2016  . Sjogren's disease ([place]) 05/03/2016  . Other headache syndrome 09/08/2015  . Transverse myelitis ([place]) 05/06/2015  . Dysesthesia 05/06/2015  . Neck pain 05/06/2015  . Occipital neuralgia 05/06/2015  . Spastic gait 02/05/2015  . Urinary frequency 02/05/2015  . Other fatigue 02/05/2015  . Multiple lung nodules on CT 09/24/2013  . Nipple discharge 08/04/2012  . 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 VOCAL CORD DISORDER 10/21/2009  . DYSPHAGIA, OROPHARYNGEAL PHASE 10/21/2009  . PULMONARY NODULE, RIGHT UPPER LOBE 01/22/2009  . DYSPNEA 09/24/2008  . BRONCHITIS, OBSTRUCTIVE CHRONIC, WITH EXACERBATION 09/10/2008  . G  E R D 09/10/2008  . MULTIPLE SCLEROSIS 03/13/2008  . MIGRAINE, CHRONIC 03/13/2008  . CHRONIC RHINITIS 03/13/2008  . COUGH 03/13/2008   Past Medical History:  Diagnosis Date  . Arthritis    back  . Dysesthesia    bilateral feet  . Family history of adverse reaction to anesthesia    mother -- ponv  . Fatigue   . Fecal incontinence   . Frequent falls    due to MS  . GERD (gastroesophageal reflux disease)   . Heart murmur   . History of colon polyps    2013;  2015  . 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 Migraines    right side   . Mitral  valve prolapse    states no problem  . Multiple sclerosis [facility]) 1989   neurologist-  dr Felecia Shelling  . Neurogenic bladder   . Osteoporosis   . PONV (postoperative nausea and vomiting)   . Pulmonary nodule, right    pulmologist-  dr Milinda Hirschfeld--  stable per ct 05-17-2016  . Short of breath on exertion   . Sjogren's disease ([place])    dryness of eyes, mouth  . Spastic gait   . 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 Wears bilateral ankle braces    AFO braces for stability    Family History  Problem Relation Age of Onset  . Liver disease Father   . Cirrhosis Father   . Stroke Mother   . Cancer Mother        oropharyngeal  . Heart attack Mother   . Anesthesia problems Mother        post-op nausea  . Cancer Brother        twin; tonsillar?  . Cancer Sister 32       breast ca, lung ca  . Cancer Other        Nephew; appendiceal carcnoid, melanoma  . Cancer Paternal Grandmother        cervical    Past Surgical History:  Procedure Laterality Date  . ANAL RECTAL MANOMETRY N/A 08/18/2016   Procedure: ANO RECTAL MANOMETRY;  Surgeon: Leighton Ruff, MD;  [facility];  Service: Endoscopy;  Laterality: N/A;  . ANTERIOR CERVICAL DECOMP/DISCECTOMY FUSION  2004;   2003   C4 -- C5 (2004)/   C3 -- C4 (2003)  . APPENDECTOMY  06/22/2011   laparoscopic  . BILATERAL SALPINGOOPHORECTOMY  1982  . BLADDER SUSPENSION  1994   sling  . BREAST ENHANCEMENT SURGERY Bilateral   . BREAST IMPLANT REMOVAL Bilateral 12/03/2008  . BUNIONECTOMY    . CATARACT EXTRACTION W/ INTRAOCULAR LENS  IMPLANT, BILATERAL  2015  . ELBOW SURGERY Right   . HEMORROIDECTOMY  08/05/2010;  2013  . INSERTION SACRAL NERVE STIMULATOR TEST WIRE  09-28-2016   dr Marcello Moores  . INTERCOSTAL NERVE BLOCK  2015   occipital  . RECTAL ULTRASOUND N/A 08/18/2016   Procedure: RECTAL ULTRASOUND;  Surgeon: Leighton Ruff, MD;  [facility];  Service: Endoscopy;  Laterality: N/A;  . SHOULDER ARTHROSCOPY Left   . SHOULDER ARTHROSCOPY WITH  DISTAL CLAVICLE RESECTION Right 09/21/2007   w/  Acrominoplasty,  Debridement Rotator Cuff,  CA ligament release  . TONSILLECTOMY  age 20  . TRIGGER FINGER RELEASE Left 01/16/2015   Procedure: LEFT THUMB TRIGGER RELEASE ;  Surgeon: Leanora Cover, MD;  Location: [facility];  Service: Orthopedics;  Laterality: Left;  Marland Kitchen VAGINAL HYSTERECTOMY  1981   Social History   Occupational History  . Occupation: Retired  Tobacco Use  . Smoking status: Passive Smoke Exposure - Never Smoker  . Smokeless tobacco: Never Used  . Tobacco comment: parents, husband, & multiple family members  Substance and Sexual Activity  . Alcohol use: No    Alcohol/week: 0.0 standard drinks  . Drug use: No  . Sexual activity: Not on file

## 2018-10-19 ENCOUNTER — Ambulatory Visit (INDEPENDENT_AMBULATORY_CARE_PROVIDER_SITE_OTHER): Payer: PPO | Admitting: Neurology

## 2018-10-19 ENCOUNTER — Encounter: Payer: Self-pay | Admitting: Neurology

## 2018-10-19 VITALS — BP 135/87 | HR 77 | Wt 146.5 lb

## 2018-10-19 DIAGNOSIS — M542 Cervicalgia: Secondary | ICD-10-CM | POA: Diagnosis not present

## 2018-10-19 DIAGNOSIS — G373 Acute transverse myelitis in demyelinating disease of central nervous system: Secondary | ICD-10-CM

## 2018-10-19 DIAGNOSIS — R261 Paralytic gait: Secondary | ICD-10-CM | POA: Diagnosis not present

## 2018-10-19 DIAGNOSIS — R5383 Other fatigue: Secondary | ICD-10-CM

## 2018-10-19 DIAGNOSIS — R208 Other disturbances of skin sensation: Secondary | ICD-10-CM

## 2018-10-19 DIAGNOSIS — G35 Multiple sclerosis: Secondary | ICD-10-CM

## 2018-10-19 DIAGNOSIS — F988 Other specified behavioral and emotional disorders with onset usually occurring in childhood and adolescence: Secondary | ICD-10-CM

## 2018-10-19 MED ORDER — AMPHETAMINE-DEXTROAMPHETAMINE 15 MG PO TABS: ORAL_TABLET | ORAL | 0 refills | Status: DC

## 2018-10-19 MED ORDER — ROPINIROLE HCL 0.25 MG PO TABS: ORAL_TABLET | ORAL | 3 refills | Status: DC
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
## 2018-10-24 ENCOUNTER — Telehealth: Payer: Self-pay | Admitting: Neurology

## 2018-10-24 DIAGNOSIS — R261 Paralytic gait: Secondary | ICD-10-CM

## 2018-10-24 DIAGNOSIS — G35 Multiple sclerosis: Secondary | ICD-10-CM

## 2018-10-24 NOTE — Telephone Encounter (Signed)
I spoke with Dr. Felecia Shelling and he is okay to place order for [facility] and send to [facility]. She may need to have an evaluation via PT in order to receive new WC. We will see what [facility] asks for.

## 2018-10-24 NOTE — Addendum Note (Signed)
Addended by: Hope Pigeon on: 10/24/2018 05:15 PM   Modules accepted: Orders

## 2018-10-24 NOTE — Telephone Encounter (Signed)
Pt has called stating that [employer] told her to call Dr Felecia Shelling for orders for a replacement of her 1994 wheelchair.  Please call

## 2018-10-24 NOTE — Telephone Encounter (Signed)
I called patient back. She does not use wheelchair often. Her current wheelchair has a screw that is missing. She is unable to use. States she believes its a slim manual wheelchair with cushion seat/foot rests. She needs reliable transportation. Wheelchair more for her MS, transverse myelitis. If ok with Dr. Felecia Shelling, she is wanting order sent to [facility]. Advised I will discuss with Dr. Felecia Shelling and call her back if I need anything more. She verbalized understanding and appreciation.

## 2018-10-24 NOTE — Telephone Encounter (Signed)
rx printed,waiting on MD signature

## 2018-10-25 NOTE — Telephone Encounter (Signed)
Faxed order to [facility] at [phone]. Received fax confirmation.

## 2018-10-30 NOTE — Telephone Encounter (Signed)
Faxed back signed order re: WC (lightweight, wheelchair cushion, wheel lock extensions, anti-tippers) with last office notes for supporting documentation to [facility] at [phone]. Received fax confirmation.

## 2018-11-08 DIAGNOSIS — Z23 Encounter for immunization: Secondary | ICD-10-CM | POA: Diagnosis not present

## 2018-11-15 ENCOUNTER — Ambulatory Visit (INDEPENDENT_AMBULATORY_CARE_PROVIDER_SITE_OTHER): Payer: PPO | Admitting: Orthopedic Surgery

## 2018-11-15 ENCOUNTER — Ambulatory Visit (INDEPENDENT_AMBULATORY_CARE_PROVIDER_SITE_OTHER): Payer: PPO | Admitting: Physician Assistant

## 2018-11-15 ENCOUNTER — Encounter (INDEPENDENT_AMBULATORY_CARE_PROVIDER_SITE_OTHER): Payer: Self-pay | Admitting: Family

## 2018-11-15 VITALS — Ht 70.0 in | Wt 146.0 lb

## 2018-11-15 DIAGNOSIS — S83282S Other tear of lateral meniscus, current injury, left knee, sequela: Secondary | ICD-10-CM

## 2018-11-15 MED ORDER — METHYLPREDNISOLONE ACETATE 40 MG/ML IJ SUSP
40.0000 mg | INTRAMUSCULAR | Status: AC | PRN
  Administered 2018-11-15: 40 mg via INTRA_ARTICULAR

## 2018-11-15 MED ORDER — LIDOCAINE HCL 1 % IJ SOLN
5.0000 mL | INTRAMUSCULAR | Status: AC | PRN
  Administered 2018-11-15: 5 mL
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
## 2018-11-16 DIAGNOSIS — H43813 Vitreous degeneration, bilateral: Secondary | ICD-10-CM | POA: Diagnosis not present

## 2018-11-16 DIAGNOSIS — H04123 Dry eye syndrome of bilateral lacrimal glands: Secondary | ICD-10-CM | POA: Diagnosis not present

## 2018-11-16 DIAGNOSIS — H26493 Other secondary cataract, bilateral: Secondary | ICD-10-CM | POA: Diagnosis not present

## 2018-11-20 ENCOUNTER — Telehealth: Payer: Self-pay | Admitting: Neurology

## 2018-11-20 MED ORDER — AMANTADINE HCL 100 MG PO TABS: ORAL_TABLET | ORAL | 5 refills | Status: DC

## 2018-11-20 NOTE — Telephone Encounter (Signed)
Called pt. I relayed Dr. Garth Bigness recommendation. Pt agreeable to this. [facility]. She will call back if any further questions/concerns.

## 2018-11-20 NOTE — Telephone Encounter (Addendum)
Spoke with Dr. Felecia Shelling- Ok to add Amantidine 100mg  TID, start BID and add third dose after a few days #90, 5 refills

## 2018-11-20 NOTE — Telephone Encounter (Signed)
I called patient back. She would like to discuss trying amantidine for fatigue. She has family who is on this and she has noticed an improvement for them. She is on adderral for fatigue. She is not sure if this could be added to current medications or would replace something she is already taking. Advised I will discuss with Dr. Felecia Shelling and call her back.

## 2018-11-20 NOTE — Telephone Encounter (Signed)
Pt would like a call to discuss the idea of Amantadine being a medication for her MS Treatment

## 2018-11-20 NOTE — Addendum Note (Signed)
Addended by: Rossie Muskrat L on: 11/20/2018 11:10 AM   Modules accepted: Orders

## 2018-12-04 ENCOUNTER — Other Ambulatory Visit: Payer: Self-pay | Admitting: Neurology

## 2018-12-04 MED ORDER — AMPHETAMINE-DEXTROAMPHETAMINE 15 MG PO TABS: ORAL_TABLET | ORAL | 0 refills | Status: DC

## 2018-12-04 NOTE — Addendum Note (Signed)
Addended by: Hope Pigeon on: 12/04/2018 01:42 PM   Modules accepted: Orders

## 2018-12-04 NOTE — Telephone Encounter (Signed)
Pt has called for a refill on her amphetamine-dextroamphetamine (ADDERALL) 15 MG tablet [id number] - [place], [address] - [place]

## 2018-12-05 DIAGNOSIS — H26492 Other secondary cataract, left eye: Secondary | ICD-10-CM | POA: Diagnosis not present

## 2018-12-08 ENCOUNTER — Other Ambulatory Visit: Payer: Self-pay

## 2018-12-08 ENCOUNTER — Ambulatory Visit: Payer: PPO | Attending: Physician Assistant | Admitting: Physical Therapy

## 2018-12-08 ENCOUNTER — Encounter: Payer: Self-pay | Admitting: Physical Therapy

## 2018-12-08 DIAGNOSIS — R262 Difficulty in walking, not elsewhere classified: Secondary | ICD-10-CM | POA: Diagnosis not present

## 2018-12-08 DIAGNOSIS — R600 Localized edema: Secondary | ICD-10-CM

## 2018-12-08 DIAGNOSIS — M6281 Muscle weakness (generalized): Secondary | ICD-10-CM | POA: Diagnosis not present

## 2018-12-08 DIAGNOSIS — R2689 Other abnormalities of gait and mobility: Secondary | ICD-10-CM | POA: Diagnosis not present

## 2018-12-08 DIAGNOSIS — M25562 Pain in left knee: Secondary | ICD-10-CM
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
## 2018-12-08 NOTE — Patient Instructions (Signed)
[id number] Medbridge:  Quad sets  Assisted SLR, Assisted knee flexion supine

## 2018-12-11 ENCOUNTER — Encounter: Payer: PPO | Admitting: Physical Therapy

## 2018-12-12 DIAGNOSIS — E785 Hyperlipidemia, unspecified: Secondary | ICD-10-CM | POA: Diagnosis not present

## 2018-12-12 DIAGNOSIS — Z6820 Body mass index (BMI) 20.0-20.9, adult: Secondary | ICD-10-CM | POA: Diagnosis not present

## 2018-12-12 DIAGNOSIS — I700 Atherosclerosis of aorta: Secondary | ICD-10-CM | POA: Diagnosis not present

## 2018-12-12 DIAGNOSIS — M25562 Pain in left knee: Secondary | ICD-10-CM | POA: Diagnosis not present

## 2018-12-12 DIAGNOSIS — R2689 Other abnormalities of gait and mobility: Secondary | ICD-10-CM | POA: Diagnosis not present

## 2018-12-12 DIAGNOSIS — G35 Multiple sclerosis: Secondary | ICD-10-CM | POA: Diagnosis not present

## 2018-12-12 DIAGNOSIS — E7849 Other hyperlipidemia: Secondary | ICD-10-CM | POA: Diagnosis not present

## 2018-12-12 DIAGNOSIS — M810 Age-related osteoporosis without current pathological fracture: Secondary | ICD-10-CM | POA: Diagnosis not present

## 2018-12-13 ENCOUNTER — Ambulatory Visit: Payer: PPO | Admitting: Physical Therapy

## 2018-12-13 ENCOUNTER — Encounter: Payer: Self-pay | Admitting: Physical Therapy

## 2018-12-13 DIAGNOSIS — M25562 Pain in left knee: Secondary | ICD-10-CM | POA: Diagnosis not present

## 2018-12-13 DIAGNOSIS — R600 Localized edema: Secondary | ICD-10-CM

## 2018-12-13 DIAGNOSIS — R2689 Other abnormalities of gait and mobility: Secondary | ICD-10-CM

## 2018-12-13 DIAGNOSIS — R262 Difficulty in walking, not elsewhere classified: Secondary | ICD-10-CM

## 2018-12-13 DIAGNOSIS — M6281 Muscle weakness (generalized): Secondary | ICD-10-CM

## 2018-12-13 NOTE — Therapy (Signed)
[facility] [address] [place], [place], [address] Phone: [phone]   Fax:  [phone]  Physical Therapy Treatment  Patient Details  Name: [name] MRN: [id number] Date of Birth: Jul 28, 1951 Referring Provider (PT): Neta Mends Rayburn PA   Encounter Date: 12/13/2018  PT End of Session - 12/13/18 1103    Visit Number  2    Date for PT Re-Evaluation  02/02/19    Authorization Type  [employer]     PT Start Time  1018    PT Stop Time  1113   vasopneumatic end of session   PT Time Calculation (min)  55 min    Activity Tolerance  Patient tolerated treatment well    Behavior During Therapy  [facility] for tasks assessed/performed       Past Medical History:  Diagnosis Date  . Arthritis    back  . Dysesthesia    bilateral feet  . Family history of adverse reaction to anesthesia    mother -- ponv  . Fatigue   . Fecal incontinence   . Frequent falls    due to MS  . GERD (gastroesophageal reflux disease)   . Heart murmur   . History of colon polyps    2013;  2015  . 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 Migraines    right side   . Mitral valve prolapse    states no problem  . Multiple sclerosis [facility]) 1989   neurologist-  dr Felecia Shelling  . Neurogenic bladder   . Osteoporosis   . PONV (postoperative nausea and vomiting)   . Pulmonary nodule, right    pulmologist-  dr Milinda Hirschfeld--  stable per ct 05-17-2016  . Short of breath on exertion   . Sjogren's disease ([place])    dryness of eyes, mouth  . Spastic gait   . Transverse myelitis ([place])    T4  . Wears bilateral ankle braces    AFO braces for stability    Past Surgical History:  Procedure Laterality Date  . ANAL RECTAL MANOMETRY N/A 08/18/2016   Procedure: ANO RECTAL MANOMETRY;  Surgeon: Leighton Ruff, MD;  Location: [facility];  Service: Endoscopy;  Laterality: N/A;   . ANTERIOR CERVICAL DECOMP/DISCECTOMY FUSION  2004;   2003   C4 -- C5 (2004)/   C3 -- C4 (2003)  . APPENDECTOMY  06/22/2011   laparoscopic  . BILATERAL SALPINGOOPHORECTOMY  1982  . BLADDER SUSPENSION  1994   sling  . BREAST ENHANCEMENT SURGERY Bilateral   . BREAST IMPLANT REMOVAL Bilateral 12/03/2008  . BUNIONECTOMY    . CATARACT EXTRACTION W/ INTRAOCULAR LENS  IMPLANT, BILATERAL  2015  . ELBOW SURGERY Right   . HEMORROIDECTOMY  08/05/2010;  2013  . INSERTION SACRAL NERVE STIMULATOR TEST WIRE  09-28-2016   dr Marcello Moores  . INTERCOSTAL NERVE BLOCK  2015   occipital  . RECTAL ULTRASOUND N/A 08/18/2016   Procedure: RECTAL ULTRASOUND;  Surgeon: Leighton Ruff, MD;  [facility];  Service: Endoscopy;  Laterality: N/A;  . SHOULDER ARTHROSCOPY Left   . SHOULDER ARTHROSCOPY WITH DISTAL CLAVICLE RESECTION Right 09/21/2007   w/  Acrominoplasty,  Debridement Rotator Cuff,  CA ligament release  . TONSILLECTOMY  age 26  . TRIGGER FINGER RELEASE Left 01/16/2015   Procedure: LEFT THUMB TRIGGER RELEASE ;  Surgeon: Leanora Cover, MD;  Location: [place];  Service: Orthopedics;  Laterality: Left;  Marland Kitchen VAGINAL HYSTERECTOMY  1981    There were no vitals filed for this visit.  Subjective Assessment - 12/13/18 1019    Subjective  Pt states pain this morning is about a 2/10 but increases with "the wrong move" or being on feet for a long time which she was this weekend.    Pertinent History  Multiple sclerosis; neurologic gait, dysesthesia, transverse myelitis  uses SPC prior to surgery;  decreased memory    Limitations  Walking;Standing;House hold activities;Lifting    Patient Stated Goals  bend my knee better, sit to stand easier from chair/toilet ; help swelling/pain     Currently in Pain?  Yes    Pain Score  2     Pain Location  Knee    Pain Orientation  Left    Pain Type  Surgical pain                       [facility] Adult PT Treatment/Exercise - 12/13/18 0001       Exercises   Exercises  Knee/Hip      Knee/Hip Exercises: Seated   Heel Slides  AROM;AAROM;10 reps    Heel Slides Limitations  use of Rt LE to assist knee flexion     Sit to Sand  5 reps;with UE support   PT cued nose over toes     Knee/Hip Exercises: Supine   Short Arc Quad Sets  Right;5 reps;2 sets;AAROM   PT assisted Rt LE   Hip Adduction Isometric  Strengthening;10 reps   5 sec holds, ball squeeze   Bridges  Strengthening;10 reps    Straight Leg Raises  Right;AAROM;10 reps   PT assisted Rt LE   Other Supine Knee/Hip Exercises  hamstring isotonic manual resistance with hip at 90 deg x 10 reps      Vasopneumatic   Number Minutes Vasopneumatic   15 minutes    Vasopnuematic Location   Knee    Vasopneumatic Pressure  Low    Vasopneumatic Temperature   34 degrees      Manual Therapy   Manual Therapy  Soft tissue mobilization    Soft tissue mobilization  retrograde massage for edema management               PT Short Term Goals - 12/08/18 1138      PT SHORT TERM GOAL #1   Title  Pt will be demonstrate knowledge in basic self care strategies for knee pain and edema  control     Time  4    Period  Weeks    Status  New    Target Date  01/05/19      PT SHORT TERM GOAL #2   Title  Left knee pain improved by 30% with basic ADLs    Time  4    Period  Weeks    Status  New      PT SHORT TERM GOAL #3   Title  Left knee flexion to 90 degrees actively for greater ease getting in/out of the car and from the chair    Time  4    Period  Weeks    Status  New      PT SHORT TERM GOAL #4   Title  Decreased edema in peripatellar region to 38 cm circumferential measurement  needed for improved muscle activation and ROM     Time  4    Period  Weeks  Status  New        PT Long Term Goals - 12/08/18 1755      PT LONG TERM GOAL #1   Title  The patient will be independent with safe self progression of HEP    Time  8    Period  Weeks    Status  New    Target Date   02/02/19      PT LONG TERM GOAL #2   Title  Pt will be able to report left knee pain </= 3/10 when walking >/= 200 feet on community surfaces using her single point cane.     Time  8    Period  Weeks    Status  New      PT LONG TERM GOAL #3   Title  The patient will have increased knee flexion ROM to 105 degrees needed for greater ease maneuvering her leg in/out of the shower    Time  8    Period  Weeks    Status  New      PT LONG TERM GOAL #4   Title  The patient will have improved left LE strength and motor control to grossly 3-/5 for standing for longer periods of time and with sit to stand     Time  8    Period  Weeks    Status  New      PT LONG TERM GOAL #5   Title  FOTO functional outcome score improved from 59% limitation to 44% indicating improved function with less pain     Time  8    Period  Weeks    Status  New            Plan - 12/13/18 1104    Clinical Impression Statement  Pt with ongoing swelling and tenderness along lateral>medial joint line.  Hypersensitive to the touch.  Pt easily fatigues secondary to MS and needs manual assistance during ther ex (AA/ROM) by PT.  Pt instructed in assisted knee flexion in supine and sitting today with use of Rt LE to assist.  Pt able to perform 5 reps of sit to stand with bil UE assist and cue by PT to perform nose over toes.  Pt uses Rt LE> Lt LE due to pain and decreased ROM during sit to stand.  Pt reported slight increase in pain today from 2/10 to 3/10 with ther ex.  She will continue to benefit from skilled PT to address deficits along POC.    Rehab Potential  Good    Clinical Impairments Affecting Rehab Potential  fall risk    PT Frequency  2x / week    PT Duration  8 weeks    PT Treatment/Interventions  ADLs/Self Care Home Management;Electrical Stimulation;Cryotherapy;Moist Heat;Iontophoresis 4mg /ml Dexamethasone;Functional mobility training;Therapeutic activities;Therapeutic exercise;Patient/family  education;Neuromuscular re-education;Manual techniques;Taping;Vasopneumatic Device    PT Next Visit Plan  trial Nu-Step next visit, continue assisted ther ex for Rt LE strengthening, vasocompression    PT Home Exercise Plan  [id number]    Consulted and Agree with Plan of Care  Patient       Patient will benefit from skilled therapeutic intervention in order to improve the following deficits and impairments:  Difficulty walking, Pain, Increased edema, Decreased activity tolerance, Decreased strength, Decreased range of motion, Impaired perceived functional ability  Visit Diagnosis: Acute pain of left knee  Difficulty in walking, not elsewhere classified  Muscle weakness (generalized)  Localized edema  Other abnormalities of gait and mobility  Problem List Patient Active Problem List   Diagnosis Date Noted  . Excessive daytime sleepiness 10/17/2017  . Vitamin D deficiency 12/09/2016  . Attention deficit disorder 12/09/2016  . Sjogren's disease ([place]) 05/03/2016  . Other headache syndrome 09/08/2015  . Transverse myelitis ([place]) 05/06/2015  . Dysesthesia 05/06/2015  . Neck pain 05/06/2015  . Occipital neuralgia 05/06/2015  . Spastic gait 02/05/2015  . Urinary frequency 02/05/2015  . Other fatigue 02/05/2015  . Multiple lung nodules on CT 09/24/2013  . Nipple discharge 08/04/2012  . 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 VOCAL CORD DISORDER 10/21/2009  . DYSPHAGIA, OROPHARYNGEAL PHASE 10/21/2009  . PULMONARY NODULE, RIGHT UPPER LOBE 01/22/2009  . DYSPNEA 09/24/2008  . BRONCHITIS, OBSTRUCTIVE CHRONIC, WITH EXACERBATION 09/10/2008  . G E R D 09/10/2008  . MULTIPLE SCLEROSIS 03/13/2008  . MIGRAINE, CHRONIC 03/13/2008  . CHRONIC RHINITIS 03/13/2008  . COUGH 03/13/2008   Baruch Merl, PT 12/13/18 11:11 AM   [facility] [address], [place], [place], [address] Phone: [phone]   Fax:   [phone]  Name: [name] MRN: [id number] Date of Birth: [birth year]

## 2018-12-14 ENCOUNTER — Ambulatory Visit (INDEPENDENT_AMBULATORY_CARE_PROVIDER_SITE_OTHER): Payer: PPO | Admitting: Orthopedic Surgery

## 2018-12-14 ENCOUNTER — Encounter (INDEPENDENT_AMBULATORY_CARE_PROVIDER_SITE_OTHER): Payer: Self-pay | Admitting: Physician Assistant

## 2018-12-14 VITALS — Ht 70.0 in | Wt 146.0 lb

## 2018-12-14 DIAGNOSIS — S83282S Other tear of lateral meniscus, current injury, left knee, sequela: Secondary | ICD-10-CM

## 2018-12-14 DIAGNOSIS — G35 Multiple sclerosis: Secondary | ICD-10-CM

## 2018-12-16 ENCOUNTER — Encounter (INDEPENDENT_AMBULATORY_CARE_PROVIDER_SITE_OTHER): Payer: Self-pay | Admitting: Orthopedic Surgery

## 2018-12-16 NOTE — Progress Notes (Signed)
Office Visit Note   Patient: [name]           Date of Birth: May 02, 1951           MRN: [id number] Visit Date: 12/14/2018              Requested by: Crist Infante, MD [address] [place], [place] [address] PCP: Crist Infante, MD  Chief Complaint  Patient presents with  . Left Knee - Pain, Follow-up      HPI: Patient is a 67 year old woman who presents with persistent lateral joint line pain left knee she injured her knee about a month ago previous steroid injection has helped she states she still has pain both medially and laterally and has started physical therapy.  Assessment & Plan: Visit Diagnoses:  1. MS (multiple sclerosis) ([place])   2. Acute tear lateral meniscus, left, sequela     Plan: Patient will continue with physical therapy follow-up in 4 weeks.  Since patient cannot have an MRI scan due to her spinal stimulator for the MS we would need to consider arthroscopic debridement of the left knee.  Follow-Up Instructions: Return in about 4 weeks (around 01/11/2019).   Ortho Exam  Patient is alert, oriented, no adenopathy, well-dressed, normal affect, normal respiratory effort. Examination patient has difficulty getting from a sitting to a standing position she uses a cane secondary to her MS.  She is tender to palpation over the patella tendon and maximally tender to palpation of the lateral joint line.  There is no effusion flexion and rotation is painful collaterals and cruciates are stable.  Imaging: No results found. No images are attached to the encounter.  Labs: Lab Results  Component Value Date   GRAMSTAIN CYTOSPIN 11/08/2013   GRAMSTAIN No WBC Seen 11/08/2013   GRAMSTAIN No Organisms Seen 11/08/2013   LABORGA NO GROWTH 3 DAYS 11/08/2013     Lab Results  Component Value Date   ALBUMIN 4.3 09/15/2015   ALBUMIN 4.8 02/05/2015   ALBUMIN 4.3 03/20/2013    Body mass index is 20.95 kg/m.  Orders:  No orders of the defined types were placed  in this encounter.  No orders of the defined types were placed in this encounter.    Procedures: No procedures performed  Clinical Data: No additional findings.  ROS:  All other systems negative, except as noted in the HPI. Review of Systems  Objective: Vital Signs: Ht 5\' 10"  (1.778 m)   Wt 146 lb (66.2 kg)   BMI 20.95 kg/m   Specialty Comments:  No specialty comments available.  PMFS History: Patient Active Problem List   Diagnosis Date Noted  . Excessive daytime sleepiness 10/17/2017  . Vitamin D deficiency 12/09/2016  . Attention deficit disorder 12/09/2016  . Sjogren's disease ([place]) 05/03/2016  . Other headache syndrome 09/08/2015  . Transverse myelitis ([place]) 05/06/2015  . Dysesthesia 05/06/2015  . Neck pain 05/06/2015  . Occipital neuralgia 05/06/2015  . Spastic gait 02/05/2015  . Urinary frequency 02/05/2015  . Other fatigue 02/05/2015  . Multiple lung nodules on CT 09/24/2013  . Nipple discharge 08/04/2012  . 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 VOCAL CORD DISORDER 10/21/2009  . DYSPHAGIA, OROPHARYNGEAL PHASE 10/21/2009  . PULMONARY NODULE, RIGHT UPPER LOBE 01/22/2009  . DYSPNEA 09/24/2008  . BRONCHITIS, OBSTRUCTIVE CHRONIC, WITH EXACERBATION 09/10/2008  . G E R D 09/10/2008  . MULTIPLE SCLEROSIS 03/13/2008  . MIGRAINE, CHRONIC 03/13/2008  . CHRONIC RHINITIS 03/13/2008  . COUGH 03/13/2008   Past Medical  History:  Diagnosis Date  . Arthritis    back  . Dysesthesia    bilateral feet  . Family history of adverse reaction to anesthesia    mother -- ponv  . Fatigue   . Fecal incontinence   . Frequent falls    due to MS  . GERD (gastroesophageal reflux disease)   . Heart murmur   . History of colon polyps    2013;  2015  . 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 Migraines    right side   . Mitral valve prolapse    states no problem  . Multiple sclerosis [facility]) 1989    neurologist-  dr Felecia Shelling  . Neurogenic bladder   . Osteoporosis   . PONV (postoperative nausea and vomiting)   . Pulmonary nodule, right    pulmologist-  dr Milinda Hirschfeld--  stable per ct 05-17-2016  . Short of breath on exertion   . Sjogren's disease ([place])    dryness of eyes, mouth  . Spastic gait   . 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 Wears bilateral ankle braces    AFO braces for stability    Family History  Problem Relation Age of Onset  . Liver disease Father   . Cirrhosis Father   . Stroke Mother   . Cancer Mother        oropharyngeal  . Heart attack Mother   . Anesthesia problems Mother        post-op nausea  . Cancer Brother        twin; tonsillar?  . Cancer Sister 55       breast ca, lung ca  . Cancer Other        Nephew; appendiceal carcnoid, melanoma  . Cancer Paternal Grandmother        cervical    Past Surgical History:  Procedure Laterality Date  . ANAL RECTAL MANOMETRY N/A 08/18/2016   Procedure: ANO RECTAL MANOMETRY;  Surgeon: Leighton Ruff, MD;  [facility];  Service: Endoscopy;  Laterality: N/A;  . ANTERIOR CERVICAL DECOMP/DISCECTOMY FUSION  2004;   2003   C4 -- C5 (2004)/   C3 -- C4 (2003)  . APPENDECTOMY  06/22/2011   laparoscopic  . BILATERAL SALPINGOOPHORECTOMY  1982  . BLADDER SUSPENSION  1994   sling  . BREAST ENHANCEMENT SURGERY Bilateral   . BREAST IMPLANT REMOVAL Bilateral 12/03/2008  . BUNIONECTOMY    . CATARACT EXTRACTION W/ INTRAOCULAR LENS  IMPLANT, BILATERAL  2015  . ELBOW SURGERY Right   . HEMORROIDECTOMY  08/05/2010;  2013  . INSERTION SACRAL NERVE STIMULATOR TEST WIRE  09-28-2016   dr Marcello Moores  . INTERCOSTAL NERVE BLOCK  2015   occipital  . RECTAL ULTRASOUND N/A 08/18/2016   Procedure: RECTAL ULTRASOUND;  Surgeon: Leighton Ruff, MD;  [facility];  Service: Endoscopy;  Laterality: N/A;  . SHOULDER ARTHROSCOPY Left   . SHOULDER ARTHROSCOPY WITH DISTAL CLAVICLE RESECTION Right 09/21/2007   w/  Acrominoplasty,   Debridement Rotator Cuff,  CA ligament release  . TONSILLECTOMY  age 24  . TRIGGER FINGER RELEASE Left 01/16/2015   Procedure: LEFT THUMB TRIGGER RELEASE ;  Surgeon: Leanora Cover, MD;  Location: [facility];  Service: Orthopedics;  Laterality: Left;  Marland Kitchen VAGINAL HYSTERECTOMY  1981   Social History   Occupational History  . Occupation: Retired  Tobacco Use  . Smoking  status: Passive Smoke Exposure - Never Smoker  . Smokeless tobacco: Never Used  . Tobacco comment: parents, husband, & multiple family members  Substance and Sexual Activity  . Alcohol use: No    Alcohol/week: 0.0 standard drinks  . Drug use: No  . Sexual activity: Not on file

## 2018-12-18 ENCOUNTER — Ambulatory Visit: Payer: PPO | Admitting: Physical Therapy

## 2018-12-18 ENCOUNTER — Encounter: Payer: Self-pay | Admitting: Physical Therapy

## 2018-12-18 DIAGNOSIS — M25562 Pain in left knee: Secondary | ICD-10-CM

## 2018-12-18 DIAGNOSIS — M6281 Muscle weakness (generalized): Secondary | ICD-10-CM

## 2018-12-18 DIAGNOSIS — R2689 Other abnormalities of gait and mobility: Secondary | ICD-10-CM

## 2018-12-18 DIAGNOSIS — R262 Difficulty in walking, not elsewhere classified: Secondary | ICD-10-CM

## 2018-12-18 DIAGNOSIS — R600 Localized edema: Secondary | ICD-10-CM

## 2018-12-18 NOTE — Therapy (Signed)
[facility] [address], [place], [place], [address] Phone: [phone]   Fax:  [phone]  Physical Therapy Treatment  Patient Details  Name: [name] MRN: [id number] Date of Birth: 16-Aug-1951 Referring Provider (PT): Neta Mends [place]   Encounter Date: 12/18/2018  PT End of Session - 12/18/18 0936    Visit Number  3    Date for PT Re-Evaluation  02/02/19    Authorization Type  [employer]     PT Start Time  0930    PT Stop Time  1025   vasopneumatic   PT Time Calculation (min)  55 min    Activity Tolerance  Patient tolerated treatment well;Patient limited by pain    Behavior During Therapy  [facility] for tasks assessed/performed       Past Medical History:  Diagnosis Date  . Arthritis    back  . Dysesthesia    bilateral feet  . Family history of adverse reaction to anesthesia    mother -- ponv  . Fatigue   . Fecal incontinence   . Frequent falls    due to MS  . GERD (gastroesophageal reflux disease)   . Heart murmur   . History of colon polyps    2013;  2015  . 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 Migraines    right side   . Mitral valve prolapse    states no problem  . Multiple sclerosis [facility]) 1989   neurologist-  dr Felecia Shelling  . Neurogenic bladder   . Osteoporosis   . PONV (postoperative nausea and vomiting)   . Pulmonary nodule, right    pulmologist-  dr Milinda Hirschfeld--  stable per ct 05-17-2016  . Short of breath on exertion   . Sjogren's disease ([place])    dryness of eyes, mouth  . Spastic gait   . Transverse myelitis ([place])    T4  . Wears bilateral ankle braces    AFO braces for stability    Past Surgical History:  Procedure Laterality Date  . ANAL RECTAL MANOMETRY N/A 08/18/2016   Procedure: ANO RECTAL MANOMETRY;  Surgeon: Leighton Ruff, MD;  Location: [facility];  Service: Endoscopy;   Laterality: N/A;  . ANTERIOR CERVICAL DECOMP/DISCECTOMY FUSION  2004;   2003   C4 -- C5 (2004)/   C3 -- C4 (2003)  . APPENDECTOMY  06/22/2011   laparoscopic  . BILATERAL SALPINGOOPHORECTOMY  1982  . BLADDER SUSPENSION  1994   sling  . BREAST ENHANCEMENT SURGERY Bilateral   . BREAST IMPLANT REMOVAL Bilateral 12/03/2008  . BUNIONECTOMY    . CATARACT EXTRACTION W/ INTRAOCULAR LENS  IMPLANT, BILATERAL  2015  . ELBOW SURGERY Right   . HEMORROIDECTOMY  08/05/2010;  2013  . INSERTION SACRAL NERVE STIMULATOR TEST WIRE  09-28-2016   dr Marcello Moores  . INTERCOSTAL NERVE BLOCK  2015   occipital  . RECTAL ULTRASOUND N/A 08/18/2016   Procedure: RECTAL ULTRASOUND;  Surgeon: Leighton Ruff, MD;  [facility];  Service: Endoscopy;  Laterality: N/A;  . SHOULDER ARTHROSCOPY Left   . SHOULDER ARTHROSCOPY WITH DISTAL CLAVICLE RESECTION Right 09/21/2007   w/  Acrominoplasty,  Debridement Rotator Cuff,  CA ligament release  . TONSILLECTOMY  age 67  . TRIGGER FINGER RELEASE Left 01/16/2015   Procedure: LEFT THUMB TRIGGER RELEASE ;  Surgeon: Leanora Cover, MD;  Location: [place];  Service: Orthopedics;  Laterality: Left;  Marland Kitchen VAGINAL HYSTERECTOMY  1981    There were no vitals filed for this visit.  Subjective Assessment - 12/18/18 0933    Subjective  Saw the knee doctor.  He said if still swollen in 1 month he might need to scope it again.  I go back mid-Jan.  Noticed her balance was way off at church yesterday while standing.    Pertinent History  Multiple sclerosis; neurologic gait, dysesthesia, transverse myelitis  uses SPC prior to surgery;  decreased memory    Limitations  Walking;Standing;House hold activities;Lifting    Patient Stated Goals  bend my knee better, sit to stand easier from chair/toilet ; help swelling/pain     Currently in Pain?  Yes    Pain Score  4     Pain Location  Knee    Pain Orientation  Left    Pain Descriptors / Indicators  Sharp;Tightness    Pain Type   Surgical pain                       [facility] Adult PT Treatment/Exercise - 12/18/18 0001      Exercises   Exercises  Knee/Hip      Knee/Hip Exercises: Aerobic   Nustep  level 1 x 8 min   PT present to discuss MD visit, symptoms     Knee/Hip Exercises: Standing   Heel Raises  20 reps    Heel Raises Limitations  bil UE support      Knee/Hip Exercises: Seated   Heel Slides  Left    Heel Slides Limitations  with slider x 10    Ball Squeeze  10 x 3 sec      Knee/Hip Exercises: Supine   Short Arc Quad Sets  10 reps;Right;AAROM;2 sets   2nd set with adduction ball squeeze   Straight Leg Raises  Right;AAROM;5 reps   PT assisted Rt LE   Other Supine Knee/Hip Exercises  glut sets 10x 3 sec holds      Vasopneumatic   Number Minutes Vasopneumatic   15 minutes    Vasopnuematic Location   Knee    Vasopneumatic Pressure  Low    Vasopneumatic Temperature   34 degrees          Balance Exercises - 12/18/18 0942      Balance Exercises: Standing   Standing Eyes Opened  Head turns;Wide (BOA);30 secs   intermittent UE support   Tandem Stance  Eyes open;Intermittent upper extremity support;1 rep;30 secs          PT Short Term Goals - 12/18/18 1013      PT SHORT TERM GOAL #1   Title  Pt will be demonstrate knowledge in basic self care strategies for knee pain and edema  control     Time  4    Period  Weeks    Status  On-going      PT SHORT TERM GOAL #3   Title  Left knee flexion to 90 degrees actively for greater ease getting in/out of the car and from the chair    Time  4    Period  Weeks    Status  Achieved        PT Long Term Goals - 12/08/18 1755      PT LONG TERM GOAL #1   Title  The patient will be independent with safe self progression of HEP    Time  8  Period  Weeks    Status  New    Target Date  02/02/19      PT LONG TERM GOAL #2   Title  Pt will be able to report left knee pain </= 3/10 when walking >/= 200 feet on community surfaces  using her single point cane.     Time  8    Period  Weeks    Status  New      PT LONG TERM GOAL #3   Title  The patient will have increased knee flexion ROM to 105 degrees needed for greater ease maneuvering her leg in/out of the shower    Time  8    Period  Weeks    Status  New      PT LONG TERM GOAL #4   Title  The patient will have improved left LE strength and motor control to grossly 3-/5 for standing for longer periods of time and with sit to stand     Time  8    Period  Weeks    Status  New      PT LONG TERM GOAL #5   Title  FOTO functional outcome score improved from 59% limitation to 44% indicating improved function with less pain     Time  8    Period  Weeks    Status  New            Plan - 12/18/18 1011    Clinical Impression Statement  Pt with ongoing Lt knee pain and swelling but improved ability to perform more reps of SAQ with less assistance today.  Addition of ball squeeze reduced pain during SAQ.  Pt achieved 101 deg of active knee flexion during seated heel slides today.  PT added standing balance challenges today secondary to Pt report of feeling unsteady at church yetserday.  Pt will continue to benefit from skilled PT along POC.    Rehab Potential  Good    Clinical Impairments Affecting Rehab Potential  fall risk    PT Frequency  2x / week    PT Duration  8 weeks    PT Treatment/Interventions  ADLs/Self Care Home Management;Electrical Stimulation;Cryotherapy;Moist Heat;Iontophoresis 4mg /ml Dexamethasone;Functional mobility training;Therapeutic activities;Therapeutic exercise;Patient/family education;Neuromuscular re-education;Manual techniques;Taping;Vasopneumatic Device    PT Next Visit Plan  continue aarom/ther ex, Nustep, monitor edema, balance    PT Home Exercise Plan  [id number]    Consulted and Agree with Plan of Care  Patient       Patient will benefit from skilled therapeutic intervention in order to improve the following deficits and  impairments:  Difficulty walking, Pain, Increased edema, Decreased activity tolerance, Decreased strength, Decreased range of motion, Impaired perceived functional ability  Visit Diagnosis: Acute pain of left knee  Difficulty in walking, not elsewhere classified  Muscle weakness (generalized)  Localized edema  Other abnormalities of gait and mobility     Problem List Patient Active Problem List   Diagnosis Date Noted  . Excessive daytime sleepiness 10/17/2017  . Vitamin D deficiency 12/09/2016  . Attention deficit disorder 12/09/2016  . Sjogren's disease ([place]) 05/03/2016  . Other headache syndrome 09/08/2015  . Transverse myelitis ([place]) 05/06/2015  . Dysesthesia 05/06/2015  . Neck pain 05/06/2015  . Occipital neuralgia 05/06/2015  . Spastic gait 02/05/2015  . Urinary frequency 02/05/2015  . Other fatigue 02/05/2015  . Multiple lung nodules on CT 09/24/2013  . Nipple discharge 08/04/2012  . 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 VOCAL CORD DISORDER 10/21/2009  .  DYSPHAGIA, OROPHARYNGEAL PHASE 10/21/2009  . PULMONARY NODULE, RIGHT UPPER LOBE 01/22/2009  . DYSPNEA 09/24/2008  . BRONCHITIS, OBSTRUCTIVE CHRONIC, WITH EXACERBATION 09/10/2008  . G E R D 09/10/2008  . MULTIPLE SCLEROSIS 03/13/2008  . MIGRAINE, CHRONIC 03/13/2008  . CHRONIC RHINITIS 03/13/2008  . COUGH 03/13/2008    Johanna Beuhring, PT 12/18/18 10:15 AM   [facility] [address] [place], [place], [address] Phone: [phone]   Fax:  [phone]  Name: TERRILEE DUDZIK MRN: [id number] Date of Birth: November 05, 1951

## 2018-12-22 ENCOUNTER — Ambulatory Visit: Payer: PPO | Admitting: Physical Therapy

## 2018-12-22 ENCOUNTER — Encounter: Payer: Self-pay | Admitting: Physical Therapy

## 2018-12-22 DIAGNOSIS — M25562 Pain in left knee: Secondary | ICD-10-CM

## 2018-12-22 DIAGNOSIS — R600 Localized edema: Secondary | ICD-10-CM

## 2018-12-22 DIAGNOSIS — R262 Difficulty in walking, not elsewhere classified: Secondary | ICD-10-CM

## 2018-12-22 DIAGNOSIS — M6281 Muscle weakness (generalized): Secondary | ICD-10-CM

## 2018-12-22 NOTE — Therapy (Signed)
[facility] [address], [place], [place], [address] Phone: [phone]   Fax:  [phone]  Physical Therapy Treatment  Patient Details  Name: [name] MRN: [id number] Date of Birth: August 10, 1951 Referring Provider (PT): Neta Mends [place]   Encounter Date: 12/22/2018  PT End of Session - 12/22/18 1051    Visit Number  4    Date for PT Re-Evaluation  02/02/19    Authorization Type  [employer]     PT Start Time  1009    PT Stop Time  1100    PT Time Calculation (min)  51 min    Activity Tolerance  Patient limited by pain;Patient tolerated treatment well       Past Medical History:  Diagnosis Date  . Arthritis    back  . Dysesthesia    bilateral feet  . Family history of adverse reaction to anesthesia    mother -- ponv  . Fatigue   . Fecal incontinence   . Frequent falls    due to MS  . GERD (gastroesophageal reflux disease)   . Heart murmur   . History of colon polyps    2013;  2015  . 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 Migraines    right side   . Mitral valve prolapse    states no problem  . Multiple sclerosis [facility]) 1989   neurologist-  dr Felecia Shelling  . Neurogenic bladder   . Osteoporosis   . PONV (postoperative nausea and vomiting)   . Pulmonary nodule, right    pulmologist-  dr Milinda Hirschfeld--  stable per ct 05-17-2016  . Short of breath on exertion   . Sjogren's disease ([place])    dryness of eyes, mouth  . Spastic gait   . 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 Wears bilateral ankle braces    AFO braces for stability    Past Surgical History:  Procedure Laterality Date  . ANAL RECTAL MANOMETRY N/A 08/18/2016   Procedure: ANO RECTAL MANOMETRY;  Surgeon: Leighton Ruff, MD;  [facility];  Service: Endoscopy;  Laterality: N/A;  . ANTERIOR CERVICAL DECOMP/DISCECTOMY FUSION  2004;   2003   C4  -- C5 (2004)/   C3 -- C4 (2003)  . APPENDECTOMY  06/22/2011   laparoscopic  . BILATERAL SALPINGOOPHORECTOMY  1982  . BLADDER SUSPENSION  1994   sling  . BREAST ENHANCEMENT SURGERY Bilateral   . BREAST IMPLANT REMOVAL Bilateral 12/03/2008  . BUNIONECTOMY    . CATARACT EXTRACTION W/ INTRAOCULAR LENS  IMPLANT, BILATERAL  2015  . ELBOW SURGERY Right   . HEMORROIDECTOMY  08/05/2010;  2013  . INSERTION SACRAL NERVE STIMULATOR TEST WIRE  09-28-2016   dr Marcello Moores  . INTERCOSTAL NERVE BLOCK  2015   occipital  . RECTAL ULTRASOUND N/A 08/18/2016   Procedure: RECTAL ULTRASOUND;  Surgeon: Leighton Ruff, MD;  [facility];  Service: Endoscopy;  Laterality: N/A;  . SHOULDER ARTHROSCOPY Left   . SHOULDER ARTHROSCOPY WITH DISTAL CLAVICLE RESECTION Right 09/21/2007   w/  Acrominoplasty,  Debridement Rotator Cuff,  CA ligament release  . TONSILLECTOMY  age 71  . TRIGGER FINGER RELEASE Left 01/16/2015   Procedure: LEFT THUMB TRIGGER RELEASE ;  Surgeon: Leanora Cover, MD;  Location: [place];  Service: Orthopedics;  Laterality: Left;  Marland Kitchen [place]  There were no vitals filed for this visit.  Subjective Assessment - 12/22/18 1011    Subjective  Busy time of the year.  I have only done a few of the exercises.  Really sore.   Reports lateral and posterior knee swelling.      Pertinent History  Multiple sclerosis; neurologic gait, dysesthesia, transverse myelitis  uses SPC prior to surgery;  decreased memory    Patient Stated Goals  bend my knee better, sit to stand easier from chair/toilet ; help swelling/pain     Currently in Pain?  Yes    Pain Score  4     Pain Location  Knee    Pain Orientation  Left    Pain Type  Surgical pain    Aggravating Factors   standing or sitting too long;  when I first get out of bed         [facility] PT Assessment - 12/22/18 0001      AROM   Left Knee Extension  10    Left Knee Flexion  101                   [facility]  Adult PT Treatment/Exercise - 12/22/18 0001      Knee/Hip Exercises: Aerobic   Nustep  level 1 x 9 min   PT present to discuss MD visit, symptoms     Knee/Hip Exercises: Seated   Heel Slides  Left    Heel Slides Limitations  with slider x 20   with ball squeeze   Sit to Sand  --   PT cued nose over toes     Knee/Hip Exercises: Supine   Short Arc Quad Sets  AROM;Left;10 reps   with ball squeeze   Heel Slides  Left;5 reps    Heel Slides Limitations  with slider   limited reps secondary to pain    Straight Leg Raises  AAROM;Left;10 reps    Other Supine Knee/Hip Exercises  glut sets 10x  sec holds    Other Supine Knee/Hip Exercises  HS isometric 10x      Vasopneumatic   Number Minutes Vasopneumatic   15 minutes    Vasopnuematic Location   Knee    Vasopneumatic Pressure  Low    Vasopneumatic Temperature   34 degrees               PT Short Term Goals - 12/18/18 1013      PT SHORT TERM GOAL #1   Title  Pt will be demonstrate knowledge in basic self care strategies for knee pain and edema  control     Time  4    Period  Weeks    Status  On-going      PT SHORT TERM GOAL #3   Title  Left knee flexion to 90 degrees actively for greater ease getting in/out of the car and from the chair    Time  4    Period  Weeks    Status  Achieved        PT Long Term Goals - 12/08/18 1755      PT LONG TERM GOAL #1   Title  The patient will be independent with safe self progression of HEP    Time  8    Period  Weeks    Status  New    Target Date  02/02/19      PT LONG TERM GOAL #2   Title  Pt will be able to  report left knee pain </= 3/10 when walking >/= 200 feet on community surfaces using her single point cane.     Time  8    Period  Weeks    Status  New      PT LONG TERM GOAL #3   Title  The patient will have increased knee flexion ROM to 105 degrees needed for greater ease maneuvering her leg in/out of the shower    Time  8    Period  Weeks    Status  New      PT  LONG TERM GOAL #4   Title  The patient will have improved left LE strength and motor control to grossly 3-/5 for standing for longer periods of time and with sit to stand     Time  8    Period  Weeks    Status  New      PT LONG TERM GOAL #5   Title  FOTO functional outcome score improved from 59% limitation to 44% indicating improved function with less pain     Time  8    Period  Weeks    Status  New            Plan - 12/22/18 1052    Clinical Impression Statement  The patient continues to have lateral knee sensitivity as she did prior to surgery.  Less pain with ball between knees with most exercises secondary to better patellofemoral alignment.  Her lack of motor control associated with her MS causes her LE to fall into an adducted and  internally rotated position.  Her knee active ROM is much improved from initial evaluation.  Therapist closely monitoring response and modifying for pain.      Rehab Potential  Good    Clinical Impairments Affecting Rehab Potential  fall risk    PT Frequency  2x / week    PT Duration  8 weeks    PT Treatment/Interventions  ADLs/Self Care Home Management;Electrical Stimulation;Cryotherapy;Moist Heat;Iontophoresis 4mg /ml Dexamethasone;Functional mobility training;Therapeutic activities;Therapeutic exercise;Patient/family education;Neuromuscular re-education;Manual techniques;Taping;Vasopneumatic Device    PT Next Visit Plan  continue aarom/ther ex, Nustep, monitor edema, balance    PT Home Exercise Plan  [id number]       Patient will benefit from skilled therapeutic intervention in order to improve the following deficits and impairments:  Difficulty walking, Pain, Increased edema, Decreased activity tolerance, Decreased strength, Decreased range of motion, Impaired perceived functional ability  Visit Diagnosis: Acute pain of left knee  Difficulty in walking, not elsewhere classified  Muscle weakness (generalized)  Localized edema     Problem  List Patient Active Problem List   Diagnosis Date Noted  . Excessive daytime sleepiness 10/17/2017  . Vitamin D deficiency 12/09/2016  . Attention deficit disorder 12/09/2016  . Sjogren's disease ([place]) 05/03/2016  . Other headache syndrome 09/08/2015  . Transverse myelitis ([place]) 05/06/2015  . Dysesthesia 05/06/2015  . Neck pain 05/06/2015  . Occipital neuralgia 05/06/2015  . Spastic gait 02/05/2015  . Urinary frequency 02/05/2015  . Other fatigue 02/05/2015  . Multiple lung nodules on CT 09/24/2013  . Nipple discharge 08/04/2012  . 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 VOCAL CORD DISORDER 10/21/2009  . DYSPHAGIA, OROPHARYNGEAL PHASE 10/21/2009  . PULMONARY NODULE, RIGHT UPPER LOBE 01/22/2009  . DYSPNEA 09/24/2008  . BRONCHITIS, OBSTRUCTIVE CHRONIC, WITH EXACERBATION 09/10/2008  . G E R D 09/10/2008  . MULTIPLE SCLEROSIS 03/13/2008  . MIGRAINE, CHRONIC 03/13/2008  . CHRONIC RHINITIS 03/13/2008  . COUGH 03/13/2008  Ruben Im, PT 12/22/18 10:58 AM Phone: [phone] Fax: [phone]  Alvera Singh 12/22/2018, 10:57 AM  [facility] [address] [place], [place], [address] Phone: [phone]   Fax:  [phone]  Name: [name] MRN: [id number] Date of Birth: 1951/12/26

## 2018-12-25 ENCOUNTER — Ambulatory Visit: Payer: PPO | Admitting: Physical Therapy

## 2018-12-25 ENCOUNTER — Encounter: Payer: Self-pay | Admitting: Physical Therapy

## 2018-12-25 DIAGNOSIS — M6281 Muscle weakness (generalized): Secondary | ICD-10-CM

## 2018-12-25 DIAGNOSIS — M25562 Pain in left knee: Secondary | ICD-10-CM

## 2018-12-25 DIAGNOSIS — R600 Localized edema: Secondary | ICD-10-CM

## 2018-12-25 DIAGNOSIS — R262 Difficulty in walking, not elsewhere classified: Secondary | ICD-10-CM

## 2018-12-25 NOTE — Patient Instructions (Addendum)
Positioning: Edema Control    Keep leg elevated whenever possible to prevent swelling. _30 minutes each session __2__ times a day Keep above heart with ice on left knee.  Copyright  VHI. All rights reserved.  [place] [address], [place], [place] [address] Phone # [phone] Fax [phone]

## 2018-12-25 NOTE — Therapy (Signed)
[facility] [address] [place], [place], [address] Phone: [phone]   Fax:  [phone]  Physical Therapy Treatment  Patient Details  Name: [name] MRN: [id number] Date of Birth: November 29, 1951 Referring Provider (PT): Neta Mends [place]   Encounter Date: 12/25/2018  PT End of Session - 12/25/18 0867    Visit Number  5    Date for PT Re-Evaluation  02/02/19    Authorization Type  [employer]     PT Start Time  0930    PT Stop Time  1030    PT Time Calculation (min)  60 min    Activity Tolerance  Patient limited by pain;Patient tolerated treatment well    Behavior During Therapy  [facility] for tasks assessed/performed       Past Medical History:  Diagnosis Date  . Arthritis    back  . Dysesthesia    bilateral feet  . Family history of adverse reaction to anesthesia    mother -- ponv  . Fatigue   . Fecal incontinence   . Frequent falls    due to MS  . GERD (gastroesophageal reflux disease)   . Heart murmur   . History of colon polyps    2013;  2015  . 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 Migraines    right side   . Mitral valve prolapse    states no problem  . Multiple sclerosis [facility]) 1989   neurologist-  dr Felecia Shelling  . Neurogenic bladder   . Osteoporosis   . PONV (postoperative nausea and vomiting)   . Pulmonary nodule, right    pulmologist-  dr Milinda Hirschfeld--  stable per ct 05-17-2016  . Short of breath on exertion   . Sjogren's disease ([place])    dryness of eyes, mouth  . Spastic gait   . Transverse myelitis ([place])    T4  . Wears bilateral ankle braces    AFO braces for stability    Past Surgical History:  Procedure Laterality Date  . ANAL RECTAL MANOMETRY N/A 08/18/2016   Procedure: ANO RECTAL MANOMETRY;  Surgeon: Leighton Ruff, MD;  [facility]: [facility];  Service: Endoscopy;  Laterality: N/A;  .  ANTERIOR CERVICAL DECOMP/DISCECTOMY FUSION  2004;   2003   C4 -- C5 (2004)/   C3 -- C4 (2003)  . APPENDECTOMY  06/22/2011   laparoscopic  . BILATERAL SALPINGOOPHORECTOMY  1982  . BLADDER SUSPENSION  1994   sling  . BREAST ENHANCEMENT SURGERY Bilateral   . BREAST IMPLANT REMOVAL Bilateral 12/03/2008  . BUNIONECTOMY    . CATARACT EXTRACTION W/ INTRAOCULAR LENS  IMPLANT, BILATERAL  2015  . ELBOW SURGERY Right   . HEMORROIDECTOMY  08/05/2010;  2013  . INSERTION SACRAL NERVE STIMULATOR TEST WIRE  09-28-2016   dr Marcello Moores  . INTERCOSTAL NERVE BLOCK  2015   occipital  . RECTAL ULTRASOUND N/A 08/18/2016   Procedure: RECTAL ULTRASOUND;  Surgeon: Leighton Ruff, MD;  [facility];  Service: Endoscopy;  Laterality: N/A;  . SHOULDER ARTHROSCOPY Left   . SHOULDER ARTHROSCOPY WITH DISTAL CLAVICLE RESECTION Right 09/21/2007   w/  Acrominoplasty,  Debridement Rotator Cuff,  CA ligament release  . TONSILLECTOMY  age 56  . TRIGGER FINGER RELEASE Left 01/16/2015   Procedure: LEFT THUMB TRIGGER RELEASE ;  Surgeon: Leanora Cover, MD;  Location: [place];  Service: Orthopedics;  Laterality: Left;  Marland Kitchen VAGINAL HYSTERECTOMY  1981    There were no vitals filed for this visit.  Subjective Assessment - 12/25/18 0932    Subjective  I see MD in January. This weekend my left knee felt wobbly and more painful.     Pertinent History  Multiple sclerosis; neurologic gait, dysesthesia, transverse myelitis  uses SPC prior to surgery;  decreased memory    Limitations  Walking;Standing;House hold activities;Lifting    Patient Stated Goals  bend my knee better, sit to stand easier from chair/toilet ; help swelling/pain     Currently in Pain?  Yes    Pain Score  5     Pain Location  Knee    Pain Orientation  Left    Pain Descriptors / Indicators  Sharp;Tightness    Pain Type  Surgical pain    Pain Onset  More than a month ago    Pain Frequency  Intermittent    Aggravating Factors   standing or  sitting too long; when I first get out of bed    Pain Relieving Factors  I sleep with ice packs at night but the swelling still doesn't go down    Multiple Pain Sites  No         [facility] PT Assessment - 12/25/18 0001      AROM   Left Knee Flexion  110                   [facility] Adult PT Treatment/Exercise - 12/25/18 0001      Knee/Hip Exercises: Aerobic   Nustep  level 1 x 9 min; seat #10, Arm #10   PT present to discuss progress and  symptoms     Knee/Hip Exercises: Seated   Heel Slides  Left    Heel Slides Limitations  with slider x 20   with ball squeeze; no popping in left knee   Ball Squeeze  10 x 3 sec      Knee/Hip Exercises: Supine   Short Arc Quad Sets  AROM;Left;20 reps   with ball squeeze   Heel Slides  Left;15 reps    Straight Leg Raises  AAROM;Left;10 reps    Other Supine Knee/Hip Exercises  glut sets 10x  sec holds      Modalities   Modalities  Vasopneumatic      Vasopneumatic   Number Minutes Vasopneumatic   15 minutes    Vasopnuematic Location   Knee    Vasopneumatic Pressure  Low    Vasopneumatic Temperature   34 degrees             PT Education - 12/25/18 1011    Education Details  information on edema and ice    Person(s) Educated  Patient    Methods  Explanation;Handout    Comprehension  Verbalized understanding       PT Short Term Goals - 12/25/18 0950      PT SHORT TERM GOAL #1   Title  Pt will be demonstrate knowledge in basic self care strategies for knee pain and edema  control     Time  4    Period  Weeks    Status  Achieved        PT Long Term Goals - 12/08/18 1755      PT LONG TERM GOAL #1   Title  The patient will be independent with safe self progression of HEP    Time  8    Period  Weeks    Status  New    Target Date  02/02/19      PT LONG TERM GOAL #2   Title  Pt will be able to report left knee pain </= 3/10 when walking >/= 200 feet on community surfaces using her single point cane.     Time  8     Period  Weeks    Status  New      PT LONG TERM GOAL #3   Title  The patient will have increased knee flexion ROM to 105 degrees needed for greater ease maneuvering her leg in/out of the shower    Time  8    Period  Weeks    Status  New      PT LONG TERM GOAL #4   Title  The patient will have improved left LE strength and motor control to grossly 3-/5 for standing for longer periods of time and with sit to stand     Time  8    Period  Weeks    Status  New      PT LONG TERM GOAL #5   Title  FOTO functional outcome score improved from 59% limitation to 44% indicating improved function with less pain     Time  8    Period  Weeks    Status  New            Plan - 12/25/18 1275    Clinical Impression Statement  Patient had increased pain in left knee over the weekend. Patient left knee flexion A/AROM is 110 degrees. During heel slides with ball squeeze in sitting there was no popping in left knee. Patient understands how to manage her left knee pain and edema. Patient will benefit from skilled therapy to improve left knee ROM and strength while closely monitoring for pain.     Rehab Potential  Good    Clinical Impairments Affecting Rehab Potential  fall risk    PT Frequency  2x / week    PT Duration  8 weeks    PT Treatment/Interventions  ADLs/Self Care Home Management;Electrical Stimulation;Cryotherapy;Moist Heat;Iontophoresis 4mg /ml Dexamethasone;Functional mobility training;Therapeutic activities;Therapeutic exercise;Patient/family education;Neuromuscular re-education;Manual techniques;Taping;Vasopneumatic Device    PT Next Visit Plan  continue aarom/ther ex, Nustep, monitor edema, balance    PT Home Exercise Plan  [id number]    Recommended Other Services  MD signed the initial note    Consulted and Agree with Plan of Care  Patient       Patient will benefit from skilled therapeutic intervention in order to improve the following deficits and impairments:  Difficulty walking, Pain,  Increased edema, Decreased activity tolerance, Decreased strength, Decreased range of motion, Impaired perceived functional ability  Visit Diagnosis: Acute pain of left knee  Difficulty in walking, not elsewhere classified  Muscle weakness (generalized)  Localized edema     Problem List Patient Active Problem List   Diagnosis Date Noted  . Excessive daytime sleepiness 10/17/2017  . Vitamin D deficiency 12/09/2016  . Attention deficit disorder 12/09/2016  . Sjogren's disease ([place]) 05/03/2016  . Other headache syndrome 09/08/2015  . Transverse myelitis ([place]) 05/06/2015  . Dysesthesia 05/06/2015  . Neck pain 05/06/2015  . Occipital neuralgia 05/06/2015  . Spastic gait 02/05/2015  . Urinary frequency 02/05/2015  . Other fatigue 02/05/2015  . Multiple lung nodules on CT 09/24/2013  . Nipple discharge 08/04/2012  . 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 VOCAL CORD DISORDER 10/21/2009  . DYSPHAGIA, OROPHARYNGEAL PHASE 10/21/2009  .  PULMONARY NODULE, RIGHT UPPER LOBE 01/22/2009  . DYSPNEA 09/24/2008  . BRONCHITIS, OBSTRUCTIVE CHRONIC, WITH EXACERBATION 09/10/2008  . G E R D 09/10/2008  . MULTIPLE SCLEROSIS 03/13/2008  . MIGRAINE, CHRONIC 03/13/2008  . CHRONIC RHINITIS 03/13/2008  . COUGH 03/13/2008    Earlie Counts, PT 12/25/18 10:16 AM   [facility] [address], [place], [place], [address] Phone: [phone]   Fax:  [phone]  Name: [name] MRN: [id number] Date of Birth: 06-14-1951

## 2018-12-28 ENCOUNTER — Encounter: Payer: Self-pay | Admitting: Physical Therapy

## 2018-12-28 ENCOUNTER — Ambulatory Visit: Payer: PPO | Attending: Physician Assistant | Admitting: Physical Therapy

## 2018-12-28 DIAGNOSIS — M25562 Pain in left knee: Secondary | ICD-10-CM | POA: Insufficient documentation

## 2018-12-28 DIAGNOSIS — M6281 Muscle weakness (generalized): Secondary | ICD-10-CM | POA: Diagnosis not present

## 2018-12-28 DIAGNOSIS — R600 Localized edema: Secondary | ICD-10-CM | POA: Diagnosis not present

## 2018-12-28 DIAGNOSIS — R262 Difficulty in walking, not elsewhere classified: Secondary | ICD-10-CM | POA: Diagnosis not present

## 2018-12-28 NOTE — Therapy (Signed)
[facility] [address], [place], [place], [address] Phone: [phone]   Fax:  [phone]  Physical Therapy Treatment  Patient Details  Name: [name] DOBIS MRN: [id number] Date of Birth: Feb 04, 1951 Referring Provider (PT): Neta Mends [place]   Encounter Date: 12/28/2018  PT End of Session - 12/28/18 0921    Visit Number  6    Date for PT Re-Evaluation  02/02/19    Authorization Type  [employer]     PT Start Time  0847    PT Stop Time  0938    PT Time Calculation (min)  51 min    Activity Tolerance  Patient tolerated treatment well       Past Medical History:  Diagnosis Date  . Arthritis    back  . Dysesthesia    bilateral feet  . Family history of adverse reaction to anesthesia    mother -- ponv  . Fatigue   . Fecal incontinence   . Frequent falls    due to MS  . GERD (gastroesophageal reflux disease)   . Heart murmur   . History of colon polyps    2013;  2015  . 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 Migraines    right side   . Mitral valve prolapse    states no problem  . Multiple sclerosis [facility]) 1989   neurologist-  dr Felecia Shelling  . Neurogenic bladder   . Osteoporosis   . PONV (postoperative nausea and vomiting)   . Pulmonary nodule, right    pulmologist-  dr Milinda Hirschfeld--  stable per ct 05-17-2016  . Short of breath on exertion   . Sjogren's disease ([place])    dryness of eyes, mouth  . Spastic gait   . Transverse myelitis ([place])    T4  . Wears bilateral ankle braces    AFO braces for stability    Past Surgical History:  Procedure Laterality Date  . ANAL RECTAL MANOMETRY N/A 08/18/2016   Procedure: ANO RECTAL MANOMETRY;  Surgeon: Leighton Ruff, MD;  [facility];  Service: Endoscopy;  Laterality: N/A;  . ANTERIOR CERVICAL DECOMP/DISCECTOMY FUSION  2004;   2003   C4 -- C5 (2004)/   C3 -- C4  (2003)  . APPENDECTOMY  06/22/2011   laparoscopic  . BILATERAL SALPINGOOPHORECTOMY  1982  . BLADDER SUSPENSION  1994   sling  . BREAST ENHANCEMENT SURGERY Bilateral   . BREAST IMPLANT REMOVAL Bilateral 12/03/2008  . BUNIONECTOMY    . CATARACT EXTRACTION W/ INTRAOCULAR LENS  IMPLANT, BILATERAL  2015  . ELBOW SURGERY Right   . HEMORROIDECTOMY  08/05/2010;  2013  . INSERTION SACRAL NERVE STIMULATOR TEST WIRE  09-28-2016   dr Marcello Moores  . INTERCOSTAL NERVE BLOCK  2015   occipital  . RECTAL ULTRASOUND N/A 08/18/2016   Procedure: RECTAL ULTRASOUND;  Surgeon: Leighton Ruff, MD;  [facility];  Service: Endoscopy;  Laterality: N/A;  . SHOULDER ARTHROSCOPY Left   . SHOULDER ARTHROSCOPY WITH DISTAL CLAVICLE RESECTION Right 09/21/2007   w/  Acrominoplasty,  Debridement Rotator Cuff,  CA ligament release  . TONSILLECTOMY  age 6  . TRIGGER FINGER RELEASE Left 01/16/2015   Procedure: LEFT THUMB TRIGGER RELEASE ;  Surgeon: Leanora Cover, MD;  Location: [place];  Service: Orthopedics;  Laterality: Left;  Marland Kitchen VAGINAL HYSTERECTOMY  1981    There were  no vitals filed for this visit.  Subjective Assessment - 12/28/18 0849    Subjective  I think the swelling is a little better.  Still sensitive lateral knee.  Inferior knee pain overnight.      Pertinent History  Multiple sclerosis; neurologic gait, dysesthesia, transverse myelitis  uses SPC prior to surgery;  decreased memory    Currently in Pain?  Yes    Pain Score  3     Pain Location  Knee    Pain Orientation  Left                       [facility] Adult PT Treatment/Exercise - 12/28/18 0001      Knee/Hip Exercises: Aerobic   Nustep  level 2 x 7 min; seat #10, Arm #10   PT present to discuss progress and  symptoms     Knee/Hip Exercises: Seated   Heel Slides  Left    Heel Slides Limitations  with slider x 20   with ball squeeze; no popping in left knee   Ball Squeeze  10 x 3 sec      Knee/Hip Exercises:  Supine   Short Arc Quad Sets  AROM;Left;20 reps   with ball squeeze   Straight Leg Raises  AAROM;Left;10 reps    Other Supine Knee/Hip Exercises  HS isometric 10x      Vasopneumatic   Number Minutes Vasopneumatic   15 minutes    Vasopnuematic Location   Knee    Vasopneumatic Pressure  Low    Vasopneumatic Temperature   34 degrees               PT Short Term Goals - 12/25/18 0950      PT SHORT TERM GOAL #1   Title  Pt will be demonstrate knowledge in basic self care strategies for knee pain and edema  control     Time  4    Period  Weeks    Status  Achieved        PT Long Term Goals - 12/08/18 1755      PT LONG TERM GOAL #1   Title  The patient will be independent with safe self progression of HEP    Time  8    Period  Weeks    Status  New    Target Date  02/02/19      PT LONG TERM GOAL #2   Title  Pt will be able to report left knee pain </= 3/10 when walking >/= 200 feet on community surfaces using her single point cane.     Time  8    Period  Weeks    Status  New      PT LONG TERM GOAL #3   Title  The patient will have increased knee flexion ROM to 105 degrees needed for greater ease maneuvering her leg in/out of the shower    Time  8    Period  Weeks    Status  New      PT LONG TERM GOAL #4   Title  The patient will have improved left LE strength and motor control to grossly 3-/5 for standing for longer periods of time and with sit to stand     Time  8    Period  Weeks    Status  New      PT LONG TERM GOAL #5   Title  FOTO functional outcome score improved from 59% limitation  to 44% indicating improved function with less pain     Time  8    Period  Weeks    Status  New            Plan - 12/28/18 2209    Clinical Impression Statement  She continues to have lateral knee sensitivity and pain but overall decreasing edema.  Good response with vasocompression.  Improving knee active ROM.  She is able to do a quad set but lacks motor control to do  a SLR.  Discussed avoiding knee hyperextension during gait for joint protection.  Therapist closely monitoring response with all and modifying as needed.       Rehab Potential  Good    Clinical Impairments Affecting Rehab Potential  fall risk    PT Frequency  2x / week    PT Duration  8 weeks    PT Treatment/Interventions  ADLs/Self Care Home Management;Electrical Stimulation;Cryotherapy;Moist Heat;Iontophoresis 4mg /ml Dexamethasone;Functional mobility training;Therapeutic activities;Therapeutic exercise;Patient/family education;Neuromuscular re-education;Manual techniques;Taping;Vasopneumatic Device    PT Next Visit Plan  continue aarom/ther ex, Nustep, monitor edema, vasocompression; balance    PT Home Exercise Plan  [phone]       Patient will benefit from skilled therapeutic intervention in order to improve the following deficits and impairments:  Difficulty walking, Pain, Increased edema, Decreased activity tolerance, Decreased strength, Decreased range of motion, Impaired perceived functional ability  Visit Diagnosis: Acute pain of left knee  Difficulty in walking, not elsewhere classified  Muscle weakness (generalized)  Localized edema     Problem List Patient Active Problem List   Diagnosis Date Noted  . Excessive daytime sleepiness 10/17/2017  . Vitamin D deficiency 12/09/2016  . Attention deficit disorder 12/09/2016  . Sjogren's disease ([place]) 05/03/2016  . Other headache syndrome 09/08/2015  . Transverse myelitis ([place]) 05/06/2015  . Dysesthesia 05/06/2015  . Neck pain 05/06/2015  . Occipital neuralgia 05/06/2015  . Spastic gait 02/05/2015  . Urinary frequency 02/05/2015  . Other fatigue 02/05/2015  . Multiple lung nodules on CT 09/24/2013  . Nipple discharge 08/04/2012  . 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 VOCAL CORD DISORDER 10/21/2009  . DYSPHAGIA, OROPHARYNGEAL PHASE 10/21/2009  . PULMONARY NODULE, RIGHT UPPER LOBE 01/22/2009  . DYSPNEA 09/24/2008  .  BRONCHITIS, OBSTRUCTIVE CHRONIC, WITH EXACERBATION 09/10/2008  . G E R D 09/10/2008  . MULTIPLE SCLEROSIS 03/13/2008  . MIGRAINE, CHRONIC 03/13/2008  . CHRONIC RHINITIS 03/13/2008  . COUGH 03/13/2008   Ruben Im, PT 12/28/18 10:15 PM Phone: [phone] Fax: [phone] Alvera Singh 12/28/2018, 10:14 PM  [facility] [address], [place], [place], [address] Phone: [phone]   Fax:  [phone]  Name: ULONDA KLOSOWSKI MRN: [id number] Date of Birth: [birth year]

## 2019-01-02 ENCOUNTER — Encounter: Payer: Self-pay | Admitting: Physical Therapy

## 2019-01-02 ENCOUNTER — Ambulatory Visit: Payer: PPO | Admitting: Physical Therapy

## 2019-01-02 DIAGNOSIS — M6281 Muscle weakness (generalized): Secondary | ICD-10-CM

## 2019-01-02 DIAGNOSIS — R262 Difficulty in walking, not elsewhere classified: Secondary | ICD-10-CM

## 2019-01-02 DIAGNOSIS — M25562 Pain in left knee: Secondary | ICD-10-CM

## 2019-01-02 DIAGNOSIS — R600 Localized edema: Secondary | ICD-10-CM

## 2019-01-02 NOTE — Therapy (Signed)
[facility] [address] [place], [place], [address] Phone: [phone]   Fax:  [phone]  Physical Therapy Treatment  Patient Details  Name: [name] MRN: [id number] Date of Birth: 08/05/1951 Referring Provider (PT): Neta Mends Rayburn PA   Encounter Date: 01/02/2019  PT End of Session - 01/02/19 1009    Visit Number  7    Date for PT Re-Evaluation  02/02/19    Authorization Type  [employer]     PT Start Time  0930    PT Stop Time  1020    PT Time Calculation (min)  50 min    Activity Tolerance  Patient tolerated treatment well    Behavior During Therapy  [facility] for tasks assessed/performed       Past Medical History:  Diagnosis Date  . Arthritis    back  . Dysesthesia    bilateral feet  . Family history of adverse reaction to anesthesia    mother -- ponv  . Fatigue   . Fecal incontinence   . Frequent falls    due to MS  . GERD (gastroesophageal reflux disease)   . Heart murmur   . History of colon polyps    2013;  2015  . 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 Migraines    right side   . Mitral valve prolapse    states no problem  . Multiple sclerosis [facility] [place]) 1989   neurologist-  dr Felecia Shelling  . Neurogenic bladder   . Osteoporosis   . PONV (postoperative nausea and vomiting)   . Pulmonary nodule, right    pulmologist-  dr Milinda Hirschfeld--  stable per ct 05-17-2016  . Short of breath on exertion   . Sjogren's disease ([place])    dryness of eyes, mouth  . Spastic gait   . Transverse myelitis ([place])    T4  . Wears bilateral ankle braces    AFO braces for stability    Past Surgical History:  Procedure Laterality Date  . ANAL RECTAL MANOMETRY N/A 08/18/2016   Procedure: ANO RECTAL MANOMETRY;  Surgeon: Leighton Ruff, MD;  [facility]: [facility];  Service: Endoscopy;  Laterality: N/A;  . ANTERIOR CERVICAL  DECOMP/DISCECTOMY FUSION  2004;   2003   C4 -- C5 (2004)/   C3 -- C4 (2003)  . APPENDECTOMY  06/22/2011   laparoscopic  . BILATERAL SALPINGOOPHORECTOMY  1982  . BLADDER SUSPENSION  1994   sling  . BREAST ENHANCEMENT SURGERY Bilateral   . BREAST IMPLANT REMOVAL Bilateral 12/03/2008  . BUNIONECTOMY    . CATARACT EXTRACTION W/ INTRAOCULAR LENS  IMPLANT, BILATERAL  2015  . ELBOW SURGERY Right   . HEMORROIDECTOMY  08/05/2010;  2013  . INSERTION SACRAL NERVE STIMULATOR TEST WIRE  09-28-2016   dr Marcello Moores  . INTERCOSTAL NERVE BLOCK  2015   occipital  . RECTAL ULTRASOUND N/A 08/18/2016   Procedure: RECTAL ULTRASOUND;  Surgeon: Leighton Ruff, MD;  [facility];  Service: Endoscopy;  Laterality: N/A;  . SHOULDER ARTHROSCOPY Left   . SHOULDER ARTHROSCOPY WITH DISTAL CLAVICLE RESECTION Right 09/21/2007   w/  Acrominoplasty,  Debridement Rotator Cuff,  CA ligament release  . TONSILLECTOMY  age 80  . TRIGGER FINGER RELEASE Left 01/16/2015   Procedure: LEFT THUMB TRIGGER RELEASE ;  Surgeon: Leanora Cover, MD;  Location: [place];  Service: Orthopedics;  Laterality: Left;  .  VAGINAL HYSTERECTOMY  1981    There were no vitals filed for this visit.  Subjective Assessment - 01/02/19 0937    Subjective  I still have soreness inthe lateral knee. I am still trying to bend the left knee. After sitting takes 30 seconds before she can straighten her left knee and fell balanced when going into standing.     Pertinent History  Multiple sclerosis; neurologic gait, dysesthesia, transverse myelitis  uses SPC prior to surgery;  decreased memory    Limitations  Walking;Standing;House hold activities;Lifting    Patient Stated Goals  bend my knee better, sit to stand easier from chair/toilet ; help swelling/pain     Currently in Pain?  Yes    Pain Score  3     Pain Location  Knee    Pain Orientation  Left    Pain Descriptors / Indicators  Sharp;Tightness    Pain Type  Surgical pain    Pain  Onset  More than a month ago    Pain Frequency  Intermittent    Aggravating Factors   standing or sitting too long; when I first get out of bed    Pain Relieving Factors  sleep with ice packs at night but the swelling does not go down    Multiple Pain Sites  No         [facility] PT Assessment - 01/02/19 0001      PROM   Left Knee Extension  0    Left Knee Flexion  115      Palpation   Palpation comment  tenderness located on lateral superior left tibia                   [facility] Adult PT Treatment/Exercise - 01/02/19 0001      Knee/Hip Exercises: Aerobic   Nustep  level 2 x9 min; seat #10, Arm #10   PT present to discuss progress and  symptoms     Knee/Hip Exercises: Standing   Other Standing Knee Exercises  stand with left foot on slider and bringing left leg forward and back with control and contracting muscles      Knee/Hip Exercises: Supine   Quad Sets  Strengthening;Left;1 set;10 reps   hold 5 sec    Quad Sets Limitations  tactile cues to contract quads fully    Short [employer]  AAROM;Strengthening;Left;2 sets;10 reps    Short Arc Quad Sets Limitations  monitoring for patella popping and control with lowering left leg    Heel Slides  Left;15 reps   with assistance at endrange to increase ROM   Straight Leg Raises  AAROM;Left;10 reps   helping to stabilize the pelvis   Patellar Mobs  tried patella mobilization but painful on lateral patella    Knee Flexion  Strengthening;Left;1 set;10 reps   hold 5 sec for isometrics     Modalities   Modalities  Vasopneumatic;Iontophoresis      Iontophoresis   Type of Iontophoresis  Dexamethasone    Location  lateral left knee    Dose  1 ml, #1   skin intact   Time  6 hour patch      Vasopneumatic   Number Minutes Vasopneumatic   15 minutes    Vasopnuematic Location   Knee    Vasopneumatic Pressure  Low    Vasopneumatic Temperature   34 degrees               PT Short Term Goals - 01/02/19  [place] #1   Title  Pt will be demonstrate knowledge in basic self care strategies for knee pain and edema  control     Time  4    Period  Weeks    Status  Achieved      PT SHORT TERM GOAL #2   Title  Left knee pain improved by 30% with basic ADLs    Time  4    Period  Weeks    Status  On-going      PT SHORT TERM GOAL #3   Title  Left knee flexion to 90 degrees actively for greater ease getting in/out of the car and from the chair    Time  4    Period  Weeks    Status  Achieved      PT SHORT TERM GOAL #4   Title  Decreased edema in peripatellar region to 38 cm circumferential measurement  needed for improved muscle activation and ROM     Time  4    Period  Weeks    Status  On-going        PT Long Term Goals - 12/08/18 1755      PT LONG TERM GOAL #1   Title  The patient will be independent with safe self progression of HEP    Time  8    Period  Weeks    Status  New    [place] Date  02/02/19      PT LONG TERM GOAL #2   Title  Pt will be able to report left knee pain </= 3/10 when walking >/= 200 feet on community surfaces using her single point cane.     Time  8    Period  Weeks    Status  New      PT LONG TERM GOAL #3   Title  The patient will have increased knee flexion ROM to 105 degrees needed for greater ease maneuvering her leg in/out of the shower    Time  8    Period  Weeks    Status  New      PT LONG TERM GOAL #4   Title  The patient will have improved left LE strength and motor control to grossly 3-/5 for standing for longer periods of time and with sit to stand     Time  8    Period  Weeks    Status  New      PT LONG TERM GOAL #5   Title  FOTO functional outcome score improved from 59% limitation to 44% indicating improved function with less pain     Time  8    Period  Weeks    Status  New            Plan - 01/02/19 1009    Clinical Impression Statement  Patient has 115 P/ROM of left knee for flexion which increased from 96 degrees.  Patient has palpable tenderness located on the left lateral upper tibia and lateral left patella. Patient left patella will pop on occasion. Patient has swelling in the left knee. Patient needs tactile cues to work on control of left knee. Patient has atrophy of left quadriceps. Patient will benefit from skilled therapy to closely monitor for pain and work with control with exercises.     Rehab Potential  Good    Clinical Impairments Affecting Rehab Potential  fall risk  PT Frequency  2x / week    PT Duration  8 weeks    PT Treatment/Interventions  ADLs/Self Care Home Management;Electrical Stimulation;Cryotherapy;Moist Heat;Iontophoresis 4mg /ml Dexamethasone;Functional mobility training;Therapeutic activities;Therapeutic exercise;Patient/family education;Neuromuscular re-education;Manual techniques;Taping;Vasopneumatic Device    PT Next Visit Plan  continue aarom/ther ex, Nustep, monitor edema, vasocompression; balance; work with closed chain exercises; see if ionto helped    PT Home Exercise Plan  [id number]    Consulted and Agree with Plan of Care  Patient       Patient will benefit from skilled therapeutic intervention in order to improve the following deficits and impairments:  Difficulty walking, Pain, Increased edema, Decreased activity tolerance, Decreased strength, Decreased range of motion, Impaired perceived functional ability  Visit Diagnosis: Acute pain of left knee  Difficulty in walking, not elsewhere classified  Muscle weakness (generalized)  Localized edema     Problem List Patient Active Problem List   Diagnosis Date Noted  . Excessive daytime sleepiness 10/17/2017  . Vitamin D deficiency 12/09/2016  . Attention deficit disorder 12/09/2016  . Sjogren's disease ([place]) 05/03/2016  . Other headache syndrome 09/08/2015  . Transverse myelitis ([place]) 05/06/2015  . Dysesthesia 05/06/2015  . Neck pain 05/06/2015  . Occipital neuralgia 05/06/2015  . Spastic gait 02/05/2015   . Urinary frequency 02/05/2015  . Other fatigue 02/05/2015  . Multiple lung nodules on CT 09/24/2013  . Nipple discharge 08/04/2012  . 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 VOCAL CORD DISORDER 10/21/2009  . DYSPHAGIA, OROPHARYNGEAL PHASE 10/21/2009  . PULMONARY NODULE, RIGHT UPPER LOBE 01/22/2009  . DYSPNEA 09/24/2008  . BRONCHITIS, OBSTRUCTIVE CHRONIC, WITH EXACERBATION 09/10/2008  . G E R D 09/10/2008  . MULTIPLE SCLEROSIS 03/13/2008  . MIGRAINE, CHRONIC 03/13/2008  . CHRONIC RHINITIS 03/13/2008  . COUGH 03/13/2008    Earlie Counts, PT 01/02/19 10:15 AM   [facility] [address], [place], [place], [address] Phone: [phone]   Fax:  [phone]  Name: [name] MRN: [id number] Date of Birth: November 23, 1951

## 2019-01-04 ENCOUNTER — Encounter: Payer: Self-pay | Admitting: Physical Therapy

## 2019-01-04 ENCOUNTER — Ambulatory Visit: Payer: PPO | Admitting: Physical Therapy

## 2019-01-04 DIAGNOSIS — R600 Localized edema: Secondary | ICD-10-CM

## 2019-01-04 DIAGNOSIS — M25562 Pain in left knee: Secondary | ICD-10-CM | POA: Diagnosis not present

## 2019-01-04 DIAGNOSIS — M6281 Muscle weakness (generalized): Secondary | ICD-10-CM

## 2019-01-04 DIAGNOSIS — R262 Difficulty in walking, not elsewhere classified: Secondary | ICD-10-CM

## 2019-01-04 NOTE — Therapy (Signed)
[facility] [address] [place], [place], [address] Phone: [phone]   Fax:  [phone]  Physical Therapy Treatment  Patient Details  Name: [name] MRN: [id number] Date of Birth: 08-21-1951 Referring Provider (PT): Neta Mends Rayburn PA   Encounter Date: 01/04/2019  PT End of Session - 01/04/19 0850    Visit Number  8    Date for PT Re-Evaluation  02/02/19    Authorization Type  [employer]     PT Start Time  0845    PT Stop Time  0940    PT Time Calculation (min)  55 min    Activity Tolerance  Patient tolerated treatment well    Behavior During Therapy  [facility] for tasks assessed/performed       Past Medical History:  Diagnosis Date  . Arthritis    back  . Dysesthesia    bilateral feet  . Family history of adverse reaction to anesthesia    mother -- ponv  . Fatigue   . Fecal incontinence   . Frequent falls    due to MS  . GERD (gastroesophageal reflux disease)   . Heart murmur   . History of colon polyps    2013;  2015  . 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 Migraines    right side   . Mitral valve prolapse    states no problem  . Multiple sclerosis [facility]) 1989   neurologist-  dr Felecia Shelling  . Neurogenic bladder   . Osteoporosis   . PONV (postoperative nausea and vomiting)   . Pulmonary nodule, right    pulmologist-  dr Milinda Hirschfeld--  stable per ct 05-17-2016  . Short of breath on exertion   . Sjogren's disease ([place])    dryness of eyes, mouth  . Spastic gait   . Transverse myelitis ([place])    T4  . Wears bilateral ankle braces    AFO braces for stability    Past Surgical History:  Procedure Laterality Date  . ANAL RECTAL MANOMETRY N/A 08/18/2016   Procedure: ANO RECTAL MANOMETRY;  Surgeon: Leighton Ruff, MD;  [facility];  Service: Endoscopy;  Laterality: N/A;  . ANTERIOR CERVICAL  DECOMP/DISCECTOMY FUSION  2004;   2003   C4 -- C5 (2004)/   C3 -- C4 (2003)  . APPENDECTOMY  06/22/2011   laparoscopic  . BILATERAL SALPINGOOPHORECTOMY  1982  . BLADDER SUSPENSION  1994   sling  . BREAST ENHANCEMENT SURGERY Bilateral   . BREAST IMPLANT REMOVAL Bilateral 12/03/2008  . BUNIONECTOMY    . CATARACT EXTRACTION W/ INTRAOCULAR LENS  IMPLANT, BILATERAL  2015  . ELBOW SURGERY Right   . HEMORROIDECTOMY  08/05/2010;  2013  . INSERTION SACRAL NERVE STIMULATOR TEST WIRE  09-28-2016   dr Marcello Moores  . INTERCOSTAL NERVE BLOCK  2015   occipital  . RECTAL ULTRASOUND N/A 08/18/2016   Procedure: RECTAL ULTRASOUND;  Surgeon: Leighton Ruff, MD;  [facility];  Service: Endoscopy;  Laterality: N/A;  . SHOULDER ARTHROSCOPY Left   . SHOULDER ARTHROSCOPY WITH DISTAL CLAVICLE RESECTION Right 09/21/2007   w/  Acrominoplasty,  Debridement Rotator Cuff,  CA ligament release  . TONSILLECTOMY  age 68  . TRIGGER FINGER RELEASE Left 01/16/2015   Procedure: LEFT THUMB TRIGGER RELEASE ;  Surgeon: Leanora Cover, MD;  Location: [place];  Service: Orthopedics;  Laterality: Left;  .  VAGINAL HYSTERECTOMY  1981    There were no vitals filed for this visit.  Subjective Assessment - 01/04/19 0851    Subjective  My knee feels better. The ionto patch has helped. I slept better due to decreased pain.     Pertinent History  Multiple sclerosis; neurologic gait, dysesthesia, transverse myelitis  uses SPC prior to surgery;  decreased memory    Limitations  Walking;Standing;House hold activities;Lifting    Patient Stated Goals  bend my knee better, sit to stand easier from chair/toilet ; help swelling/pain     Currently in Pain?  Yes    Pain Score  2     Pain Location  Knee    Pain Orientation  Left    Pain Descriptors / Indicators  Tightness;Sharp    Pain Type  Surgical pain    Pain Onset  More than a month ago    Pain Frequency  Intermittent    Aggravating Factors   standing or sitting  too long; when first get out of bed    Pain Relieving Factors  sleep with ice packs at night but the swelling does not go down    Multiple Pain Sites  No                       [facility] Adult PT Treatment/Exercise - 01/04/19 0001      Knee/Hip Exercises: Aerobic   Nustep  level 2 x10  min; seat #10, Arm #10   PT present to discuss progress and  symptoms     Knee/Hip Exercises: Seated   Long Arc Quad  Left;2 sets;AAROM;Strengthening;10 reps   therapist assist with control; popping in left patella   Long Arc Quad Limitations  ball squeeze    Heel Slides  Left;1 set;20 reps   slider under foot   Heel Slides Limitations  intermittent sharp pain in distal left thigh   ball squeeze   Hamstring Curl  Strengthening;Left;1 set;15 reps   foot on slider   Hamstring Limitations  yellow band; ball squeeze   pain in left quad tendon     Knee/Hip Exercises: Supine   Quad Sets  Strengthening;Left;1 set;10 reps   hold 5 sec    Quad Sets Limitations  tactile cues to contract the quad better    Short Arc [employer]  AAROM;Strengthening;Left;2 sets;10 reps    Short Arc Quad Sets Limitations  ball squeeze; assistance at end range    Straight Leg Raises  AAROM;Left;10 reps   helping to stabilize the pelvis   Knee Flexion  Strengthening;Left;1 set;10 reps   hold 5 sec for isometrics     Modalities   Modalities  Vasopneumatic;Iontophoresis      Iontophoresis   Type of Iontophoresis  Dexamethasone    Location  lateral left knee    Dose  1 ml, #2   skin intact   Time  6 hour patch      Vasopneumatic   Number Minutes Vasopneumatic   15 minutes    Vasopnuematic Location   Knee    Vasopneumatic Pressure  Low    Vasopneumatic Temperature   34 degrees               PT Short Term Goals - 01/02/19 1013      PT SHORT TERM GOAL #1   Title  Pt will be demonstrate knowledge in basic self care strategies for knee pain and edema  control     Time  4  Period  Weeks    Status   Achieved      PT SHORT TERM GOAL #2   Title  Left knee pain improved by 30% with basic ADLs    Time  4    Period  Weeks    Status  On-going      PT SHORT TERM GOAL #3   Title  Left knee flexion to 90 degrees actively for greater ease getting in/out of the car and from the chair    Time  4    Period  Weeks    Status  Achieved      PT SHORT TERM GOAL #4   Title  Decreased edema in peripatellar region to 38 cm circumferential measurement  needed for improved muscle activation and ROM     Time  4    Period  Weeks    Status  On-going        PT Long Term Goals - 12/08/18 1755      PT LONG TERM GOAL #1   Title  The patient will be independent with safe self progression of HEP    Time  8    Period  Weeks    Status  New    [place] Date  02/02/19      PT LONG TERM GOAL #2   Title  Pt will be able to report left knee pain </= 3/10 when walking >/= 200 feet on community surfaces using her single point cane.     Time  8    Period  Weeks    Status  New      PT LONG TERM GOAL #3   Title  The patient will have increased knee flexion ROM to 105 degrees needed for greater ease maneuvering her leg in/out of the shower    Time  8    Period  Weeks    Status  New      PT LONG TERM GOAL #4   Title  The patient will have improved left LE strength and motor control to grossly 3-/5 for standing for longer periods of time and with sit to stand     Time  8    Period  Weeks    Status  New      PT LONG TERM GOAL #5   Title  FOTO functional outcome score improved from 59% limitation to 44% indicating improved function with less pain     Time  8    Period  Weeks    Status  New            Plan - 01/04/19 6948    Clinical Impression Statement  Patient pain on lateral left knee is decreased since her first iontophoresis treatment. Patient is showing increased strength with quad set. Patient had left quad tendon pain with heel slides in sitting. Patient had some popping in left patella  with LAQ Patient was able to sleep better due to decreased pain in left knee. Patient will benefit from skilled therapy to closely monitor for pain and work with control with exercises.     Rehab Potential  Good    Clinical Impairments Affecting Rehab Potential  fall risk    PT Frequency  2x / week    PT Duration  8 weeks    PT Next Visit Plan  continue aarom/ther ex, Nustep, monitor edema, vasocompression; balance; work with closed chain exercises;  ionto #3; measure edema    PT Home Exercise Plan  [id number]    Consulted and Agree with Plan of Care  Patient       Patient will benefit from skilled therapeutic intervention in order to improve the following deficits and impairments:  Difficulty walking, Pain, Increased edema, Decreased activity tolerance, Decreased strength, Decreased range of motion, Impaired perceived functional ability  Visit Diagnosis: Acute pain of left knee  Difficulty in walking, not elsewhere classified  Muscle weakness (generalized)  Localized edema     Problem List Patient Active Problem List   Diagnosis Date Noted  . Excessive daytime sleepiness 10/17/2017  . Vitamin D deficiency 12/09/2016  . Attention deficit disorder 12/09/2016  . Sjogren's disease ([place]) 05/03/2016  . Other headache syndrome 09/08/2015  . Transverse myelitis ([place]) 05/06/2015  . Dysesthesia 05/06/2015  . Neck pain 05/06/2015  . Occipital neuralgia 05/06/2015  . Spastic gait 02/05/2015  . Urinary frequency 02/05/2015  . Other fatigue 02/05/2015  . Multiple lung nodules on CT 09/24/2013  . Nipple discharge 08/04/2012  . 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 VOCAL CORD DISORDER 10/21/2009  . DYSPHAGIA, OROPHARYNGEAL PHASE 10/21/2009  . PULMONARY NODULE, RIGHT UPPER LOBE 01/22/2009  . DYSPNEA 09/24/2008  . BRONCHITIS, OBSTRUCTIVE CHRONIC, WITH EXACERBATION 09/10/2008  . G E R D 09/10/2008  . MULTIPLE SCLEROSIS 03/13/2008  . MIGRAINE, CHRONIC 03/13/2008  . CHRONIC RHINITIS  03/13/2008  . COUGH 03/13/2008    Earlie Counts, PT 01/04/19 9:30 AM   [facility] [address] [place], [place], [address] Phone: [phone]   Fax:  [phone]  Name: [name] MRN: [id number] Date of Birth: May 25, 1951

## 2019-01-08 ENCOUNTER — Other Ambulatory Visit: Payer: Self-pay | Admitting: Neurology

## 2019-01-08 ENCOUNTER — Encounter: Payer: Self-pay | Admitting: Physical Therapy

## 2019-01-08 ENCOUNTER — Ambulatory Visit: Payer: PPO | Admitting: Physical Therapy

## 2019-01-08 DIAGNOSIS — Z803 Family history of malignant neoplasm of breast: Secondary | ICD-10-CM | POA: Diagnosis not present

## 2019-01-08 DIAGNOSIS — M25562 Pain in left knee: Secondary | ICD-10-CM | POA: Diagnosis not present

## 2019-01-08 DIAGNOSIS — Z1231 Encounter for screening mammogram for malignant neoplasm of breast: Secondary | ICD-10-CM | POA: Diagnosis not present

## 2019-01-08 DIAGNOSIS — R600 Localized edema: Secondary | ICD-10-CM

## 2019-01-08 DIAGNOSIS — R262 Difficulty in walking, not elsewhere classified: Secondary | ICD-10-CM

## 2019-01-08 DIAGNOSIS — M6281 Muscle weakness (generalized): Secondary | ICD-10-CM

## 2019-01-08 MED ORDER — AMPHETAMINE-DEXTROAMPHETAMINE 15 MG PO TABS: ORAL_TABLET | ORAL | 0 refills | Status: DC

## 2019-01-08 NOTE — Therapy (Signed)
[facility] [address] [place], [place], [address] Phone: [phone]   Fax:  [phone]  Physical Therapy Treatment  Patient Details  Name: [name] MRN: [id number] Date of Birth: 11/29/1951 Referring Provider (PT): Neta Mends Rayburn PA   Encounter Date: 01/08/2019  PT End of Session - 01/08/19 1237    Visit Number  9    Date for PT Re-Evaluation  02/02/19    Authorization Type  [employer]     PT Start Time  1230    PT Stop Time  1315    PT Time Calculation (min)  45 min    Activity Tolerance  Patient tolerated treatment well    Behavior During Therapy  [facility] for tasks assessed/performed       Past Medical History:  Diagnosis Date  . Arthritis    back  . Dysesthesia    bilateral feet  . Family history of adverse reaction to anesthesia    mother -- ponv  . Fatigue   . Fecal incontinence   . Frequent falls    due to MS  . GERD (gastroesophageal reflux disease)   . Heart murmur   . History of colon polyps    2013;  2015  . 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 Migraines    right side   . Mitral valve prolapse    states no problem  . Multiple sclerosis [facility]) 1989   neurologist-  dr Felecia Shelling  . Neurogenic bladder   . Osteoporosis   . PONV (postoperative nausea and vomiting)   . Pulmonary nodule, right    pulmologist-  dr Milinda Hirschfeld--  stable per ct 05-17-2016  . Short of breath on exertion   . Sjogren's disease ([place])    dryness of eyes, mouth  . Spastic gait   . Transverse myelitis ([place])    T4  . Wears bilateral ankle braces    AFO braces for stability    Past Surgical History:  Procedure Laterality Date  . ANAL RECTAL MANOMETRY N/A 08/18/2016   Procedure: ANO RECTAL MANOMETRY;  Surgeon: Leighton Ruff, MD;  [facility]: [facility];  Service: Endoscopy;  Laterality: N/A;  . ANTERIOR CERVICAL  DECOMP/DISCECTOMY FUSION  2004;   2003   C4 -- C5 (2004)/   C3 -- C4 (2003)  . APPENDECTOMY  06/22/2011   laparoscopic  . BILATERAL SALPINGOOPHORECTOMY  1982  . BLADDER SUSPENSION  1994   sling  . BREAST ENHANCEMENT SURGERY Bilateral   . BREAST IMPLANT REMOVAL Bilateral 12/03/2008  . BUNIONECTOMY    . CATARACT EXTRACTION W/ INTRAOCULAR LENS  IMPLANT, BILATERAL  2015  . ELBOW SURGERY Right   . HEMORROIDECTOMY  08/05/2010;  2013  . INSERTION SACRAL NERVE STIMULATOR TEST WIRE  09-28-2016   dr Marcello Moores  . INTERCOSTAL NERVE BLOCK  2015   occipital  . RECTAL ULTRASOUND N/A 08/18/2016   Procedure: RECTAL ULTRASOUND;  Surgeon: Leighton Ruff, MD;  [facility];  Service: Endoscopy;  Laterality: N/A;  . SHOULDER ARTHROSCOPY Left   . SHOULDER ARTHROSCOPY WITH DISTAL CLAVICLE RESECTION Right 09/21/2007   w/  Acrominoplasty,  Debridement Rotator Cuff,  CA ligament release  . TONSILLECTOMY  age 30  . TRIGGER FINGER RELEASE Left 01/16/2015   Procedure: LEFT THUMB TRIGGER RELEASE ;  Surgeon: Leanora Cover, MD;  Location: [facility];  Service: Orthopedics;  Laterality: Left;  .  VAGINAL HYSTERECTOMY  1981    There were no vitals filed for this visit.  Subjective Assessment - 01/08/19 1238    Subjective  The ionto is helping. Pain in left knee is 75% better.     Pertinent History  Multiple sclerosis; neurologic gait, dysesthesia, transverse myelitis  uses SPC prior to surgery;  decreased memory    Limitations  Walking;Standing;House hold activities;Lifting    Patient Stated Goals  bend my knee better, sit to stand easier from chair/toilet ; help swelling/pain     Currently in Pain?  Yes    Pain Score  1     Pain Location  Knee    Pain Orientation  Left    Pain Descriptors / Indicators  Tightness;Sharp    Pain Onset  More than a month ago    Pain Frequency  Intermittent    Aggravating Factors   standing or sitting too long, when first get out of bed    Pain Relieving Factors   sleep with ice packs at night but the swelling does not go down    Multiple Pain Sites  No                       [facility] Adult PT Treatment/Exercise - 01/08/19 0001      Knee/Hip Exercises: Aerobic   Nustep  level 2 x10  min; seat #10, Arm #10   PT present to discuss progress and  symptoms     Knee/Hip Exercises: Seated   Long Arc Quad  Left;2 sets;AAROM;Strengthening;10 reps   therapist assist with control; popping in left patella   Long Arc Quad Limitations  ball squeeze    Heel Slides  Left;1 set;20 reps   slider under foot   Heel Slides Limitations  intermittent sharp pain in distal left thigh   ball squeeze   Hamstring Curl  Strengthening;Left;1 set;15 reps   foot on slider   Hamstring Limitations  yellow band; ball squeeze   pain in left quad tendon     Knee/Hip Exercises: Supine   Quad Sets  Strengthening;Left;1 set;10 reps   hold 5 sec    Short Arc Quad Sets  AAROM;Strengthening;Left;2 sets;10 reps    Short Arc Quad Sets Limitations  ball squeeze no assistance    Straight Leg Raises  AAROM;Left;10 reps   helping to stabilize the pelvis     Modalities   Modalities  Iontophoresis;Vasopneumatic      Iontophoresis   Type of Iontophoresis  Dexamethasone    Location  lateral left knee    Dose  1 ml, #2   skin intact   Time  6 hour patch      Vasopneumatic   Number Minutes Vasopneumatic   15 minutes    Vasopnuematic Location   Knee    Vasopneumatic Pressure  Low    Vasopneumatic Temperature   34 degrees             PT Education - 01/08/19 1306    Education Details  information on iontophoresis    Person(s) Educated  Patient    Methods  Explanation;Handout    Comprehension  Verbalized understanding       PT Short Term Goals - 01/02/19 1013      PT SHORT TERM GOAL #1   Title  Pt will be demonstrate knowledge in basic self care strategies for knee pain and edema  control     Time  4    Period  Weeks  Status  Achieved      PT SHORT  TERM GOAL #2   Title  Left knee pain improved by 30% with basic ADLs    Time  4    Period  Weeks    Status  On-going      PT SHORT TERM GOAL #3   Title  Left knee flexion to 90 degrees actively for greater ease getting in/out of the car and from the chair    Time  4    Period  Weeks    Status  Achieved      PT SHORT TERM GOAL #4   Title  Decreased edema in peripatellar region to 38 cm circumferential measurement  needed for improved muscle activation and ROM     Time  4    Period  Weeks    Status  On-going        PT Long Term Goals - 12/08/18 1755      PT LONG TERM GOAL #1   Title  The patient will be independent with safe self progression of HEP    Time  8    Period  Weeks    Status  New    Target Date  02/02/19      PT LONG TERM GOAL #2   Title  Pt will be able to report left knee pain </= 3/10 when walking >/= 200 feet on community surfaces using her single point cane.     Time  8    Period  Weeks    Status  New      PT LONG TERM GOAL #3   Title  The patient will have increased knee flexion ROM to 105 degrees needed for greater ease maneuvering her leg in/out of the shower    Time  8    Period  Weeks    Status  New      PT LONG TERM GOAL #4   Title  The patient will have improved left LE strength and motor control to grossly 3-/5 for standing for longer periods of time and with sit to stand     Time  8    Period  Weeks    Status  New      PT LONG TERM GOAL #5   Title  FOTO functional outcome score improved from 59% limitation to 44% indicating improved function with less pain     Time  8    Period  Weeks    Status  New            Plan - 01/08/19 1237    Clinical Impression Statement  Patient needed more assistance to perform straight leg raise today compared to the other times. Patient had increased control with her SAQ and heel sikdes today. Patietn had minimal popping of left knee wiht exercise. Patient will benefit from skilled therapy to closely  monitor for pain and work with contrl with exercise.     Rehab Potential  Good    Clinical Impairments Affecting Rehab Potential  fall risk    PT Frequency  2x / week    PT Duration  8 weeks    PT Treatment/Interventions  ADLs/Self Care Home Management;Electrical Stimulation;Cryotherapy;Moist Heat;Iontophoresis 4mg /ml Dexamethasone;Functional mobility training;Therapeutic activities;Therapeutic exercise;Patient/family education;Neuromuscular re-education;Manual techniques;Taping;Vasopneumatic Device    PT Next Visit Plan  continue aarom/ther ex, Nustep, monitor edema, vasocompression; balance; work with closed chain exercises;  ionto #3; measure edema; write 10th note    PT Home Exercise Plan  [id number]    Consulted and Agree with Plan of Care  Patient       Patient will benefit from skilled therapeutic intervention in order to improve the following deficits and impairments:  Difficulty walking, Pain, Increased edema, Decreased activity tolerance, Decreased strength, Decreased range of motion, Impaired perceived functional ability  Visit Diagnosis: Acute pain of left knee  Difficulty in walking, not elsewhere classified  Muscle weakness (generalized)  Localized edema     Problem List Patient Active Problem List   Diagnosis Date Noted  . Excessive daytime sleepiness 10/17/2017  . Vitamin D deficiency 12/09/2016  . Attention deficit disorder 12/09/2016  . Sjogren's disease ([place]) 05/03/2016  . Other headache syndrome 09/08/2015  . Transverse myelitis ([place]) 05/06/2015  . Dysesthesia 05/06/2015  . Neck pain 05/06/2015  . Occipital neuralgia 05/06/2015  . Spastic gait 02/05/2015  . Urinary frequency 02/05/2015  . Other fatigue 02/05/2015  . Multiple lung nodules on CT 09/24/2013  . Nipple discharge 08/04/2012  . 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 VOCAL CORD DISORDER 10/21/2009  . DYSPHAGIA, OROPHARYNGEAL PHASE 10/21/2009  . PULMONARY NODULE, RIGHT UPPER LOBE 01/22/2009  .  DYSPNEA 09/24/2008  . BRONCHITIS, OBSTRUCTIVE CHRONIC, WITH EXACERBATION 09/10/2008  . G E R D 09/10/2008  . MULTIPLE SCLEROSIS 03/13/2008  . MIGRAINE, CHRONIC 03/13/2008  . CHRONIC RHINITIS 03/13/2008  . COUGH 03/13/2008    Earlie Counts, PT 01/08/19 1:14 PM   01/08/2019, 1:14 PM   [facility] [address] [place], [place], [address] Phone: [phone]   Fax:  [phone]  Name: ALIRA FRETWELL MRN: [id number] Date of Birth: 02-Jun-1951

## 2019-01-08 NOTE — Telephone Encounter (Signed)
Patient calling for refill of amphetamine-dextroamphetamine (ADDERALL) 15 MG tablet to be sent to [facility] on [phone]

## 2019-01-08 NOTE — Patient Instructions (Signed)

## 2019-01-08 NOTE — Addendum Note (Signed)
Addended by: Hope Pigeon on: 01/08/2019 02:47 PM   Modules accepted: Orders

## 2019-01-08 NOTE — Telephone Encounter (Signed)
Checked drug registry. She last refilled 12/04/2018 #60, not receiving from other MD's. Last seen 10/19/2018 and next f/u 03/14/2019. Refill request sent to Dr. Felecia Shelling.

## 2019-01-10 ENCOUNTER — Ambulatory Visit: Payer: PPO | Admitting: Physical Therapy

## 2019-01-10 ENCOUNTER — Encounter: Payer: Self-pay | Admitting: Physical Therapy

## 2019-01-10 DIAGNOSIS — R600 Localized edema: Secondary | ICD-10-CM

## 2019-01-10 DIAGNOSIS — M25562 Pain in left knee: Secondary | ICD-10-CM | POA: Diagnosis not present

## 2019-01-10 DIAGNOSIS — R262 Difficulty in walking, not elsewhere classified: Secondary | ICD-10-CM

## 2019-01-10 DIAGNOSIS — M6281 Muscle weakness (generalized): Secondary | ICD-10-CM

## 2019-01-10 NOTE — Therapy (Addendum)
[facility] [address], [place], [place], [address] Phone: [phone]   Fax:  [phone]  Physical Therapy Treatment  Patient Details  Name: [name] MRN: [id number] Date of Birth: 08-15-1951 Referring Provider (PT): Neta Mends Rayburn PA Progress Note Reporting Period 12/08/2018  to 01/10/2019  See note below for Objective Data and Assessment of Progress/Goals.       Encounter Date: 01/10/2019  PT End of Session - 01/10/19 0924    Visit Number  10    Date for PT Re-Evaluation  02/02/19    Authorization Type  [employer]     PT Start Time  0845    PT Stop Time  0945    PT Time Calculation (min)  60 min    Activity Tolerance  Patient tolerated treatment well    Behavior During Therapy  [facility] for tasks assessed/performed       Past Medical History:  Diagnosis Date  . Arthritis    back  . Dysesthesia    bilateral feet  . Family history of adverse reaction to anesthesia    mother -- ponv  . Fatigue   . Fecal incontinence   . Frequent falls    due to MS  . GERD (gastroesophageal reflux disease)   . Heart murmur   . History of colon polyps    2013;  2015  . 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 Migraines    right side   . Mitral valve prolapse    states no problem  . Multiple sclerosis [facility]) 1989   neurologist-  dr Felecia Shelling  . Neurogenic bladder   . Osteoporosis   . PONV (postoperative nausea and vomiting)   . Pulmonary nodule, right    pulmologist-  dr Milinda Hirschfeld--  stable per ct 05-17-2016  . Short of breath on exertion   . Sjogren's disease ([place])    dryness of eyes, mouth  . Spastic gait   . 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 Wears bilateral ankle braces    AFO braces for stability    Past Surgical History:  Procedure Laterality Date  . ANAL RECTAL MANOMETRY N/A 08/18/2016   Procedure: ANO  RECTAL MANOMETRY;  Surgeon: Leighton Ruff, MD;  [facility];  Service: Endoscopy;  Laterality: N/A;  . ANTERIOR CERVICAL DECOMP/DISCECTOMY FUSION  2004;   2003   C4 -- C5 (2004)/   C3 -- C4 (2003)  . APPENDECTOMY  06/22/2011   laparoscopic  . BILATERAL SALPINGOOPHORECTOMY  1982  . BLADDER SUSPENSION  1994   sling  . BREAST ENHANCEMENT SURGERY Bilateral   . BREAST IMPLANT REMOVAL Bilateral 12/03/2008  . BUNIONECTOMY    . CATARACT EXTRACTION W/ INTRAOCULAR LENS  IMPLANT, BILATERAL  2015  . ELBOW SURGERY Right   . HEMORROIDECTOMY  08/05/2010;  2013  . INSERTION SACRAL NERVE STIMULATOR TEST WIRE  09-28-2016   dr Marcello Moores  . INTERCOSTAL NERVE BLOCK  2015   occipital  . RECTAL ULTRASOUND N/A 08/18/2016   Procedure: RECTAL ULTRASOUND;  Surgeon: Leighton Ruff, MD;  [facility];  Service: Endoscopy;  Laterality: N/A;  . SHOULDER ARTHROSCOPY Left   . SHOULDER ARTHROSCOPY WITH DISTAL CLAVICLE RESECTION Right 09/21/2007   w/  Acrominoplasty,  Debridement Rotator Cuff,  CA ligament release  . TONSILLECTOMY  age 68  . TRIGGER FINGER RELEASE Left 01/16/2015  Procedure: LEFT THUMB TRIGGER RELEASE ;  Surgeon: Leanora Cover, MD;  Location: [facility];  Service: Orthopedics;  Laterality: Left;  [place] VAGINAL HYSTERECTOMY  1981    There were no vitals filed for this visit.  Subjective Assessment - 01/10/19 0854    Subjective  I feel 67% better.     Pertinent History  Multiple sclerosis; neurologic gait, dysesthesia, transverse myelitis  uses SPC prior to surgery;  decreased memory    Limitations  Walking;Standing;House hold activities;Lifting    Patient Stated Goals  bend my knee better, sit to stand easier from chair/toilet ; help swelling/pain     Currently in Pain?  Yes    Pain Score  2     Pain Location  Knee   patella tendon   Pain Orientation  Left    Pain Descriptors / Indicators  Sharp;Tender    Pain Type  Acute pain    Pain Onset  More than a month ago     Pain Frequency  Intermittent    Aggravating Factors   touching, straightening the left knee    Pain Relieving Factors  rest knee    Multiple Pain Sites  No         [facility] PT Assessment - 01/10/19 0001      PROM   Left Knee Extension  0    Left Knee Flexion  130      Strength   Left Knee Flexion  4-/5    Left Knee Extension  3+/5      Palpation   Palpation comment  tenderness located in left patella tendon                   [facility] Adult PT Treatment/Exercise - 01/10/19 0001      Knee/Hip Exercises: Aerobic   Nustep  level 2 x10  min; seat #10 only legs      Knee/Hip Exercises: Seated   Long Arc Quad  Left;2 sets;AAROM;Strengthening;10 reps   therapist assist with control; popping in left patella   Long Arc Quad Limitations  ball squeeze    Heel Slides  Left;1 set;20 reps   slider under foot   Heel Slides Limitations  intermittent sharp pain in distal left thigh   ball squeeze   Hamstring Curl  Strengthening;Left;1 set;15 reps   foot on slider   Hamstring Limitations  yellow band; ball squeeze   pain in left quad tendon     Knee/Hip Exercises: Supine   Quad Sets  Strengthening;Left;1 set;10 reps   hold 5 sec    Short Arc Quad Sets  AROM;Strengthening;Left;1 set;10 reps    Short Arc Quad Sets Limitations  ball squeeze      Modalities   Modalities  Iontophoresis;Vasopneumatic      Iontophoresis   Type of Iontophoresis  Dexamethasone    Location  left patella tendon    Dose  1 ml, #1   skin intact   Time  6 hour patch      Vasopneumatic   Number Minutes Vasopneumatic   15 minutes    Vasopnuematic Location   Knee    Vasopneumatic Pressure  Low    Vasopneumatic Temperature   34 degrees      Manual Therapy   Manual Therapy  Passive ROM    Passive ROM  using mulligan belt in sitting to distract the left knee and pull anteriorly to imporve left knee flexion  PT Short Term Goals - 01/10/19 0925      PT SHORT TERM GOAL #1    Title  Pt will be demonstrate knowledge in basic self care strategies for knee pain and edema  control     Time  4    Period  Weeks    Status  Achieved      PT SHORT TERM GOAL #2   Title  Left knee pain improved by 30% with basic ADLs    Baseline  --    Time  4    Period  Weeks    Status  Achieved      PT SHORT TERM GOAL #3   Title  Left knee flexion to 90 degrees actively for greater ease getting in/out of the car and from the chair    Time  4    Period  Weeks    Status  Achieved      PT SHORT TERM GOAL #4   Title  Decreased edema in peripatellar region to 38 cm circumferential measurement  needed for improved muscle activation and ROM     Time  4    Period  Weeks    Status  On-going        PT Long Term Goals - 01/10/19 9163      PT LONG TERM GOAL #1   Title  The patient will be independent with safe self progression of HEP    Baseline  still learning    Time  8    Period  Weeks    Status  On-going      PT LONG TERM GOAL #2   Title  Pt will be able to report left knee pain </= 3/10 when walking >/= 200 feet on community surfaces using her single point cane.     Baseline  pain level is 2-3/10    Time  8    Period  Weeks    Status  On-going      PT LONG TERM GOAL #3   Title  The patient will have increased knee flexion ROM to 105 degrees needed for greater ease maneuvering her leg in/out of the shower    Baseline  --    Time  8    Period  Weeks    Status  Achieved      PT LONG TERM GOAL #4   Title  The patient will have improved left LE strength and motor control to grossly 3-/5 for standing for longer periods of time and with sit to stand     Time  8    Period  Weeks    Status  On-going      PT LONG TERM GOAL #5   Title  FOTO functional outcome score improved from 59% limitation to 44% indicating improved function with less pain     Time  8    Period  Weeks    Status  New            Plan - 01/10/19 8466    Clinical Impression Statement  Patient  has increased left knee P/ROM to 130 degrees. Patient is able to fully extend her left knee in sitting and increased strength to 3+/5. Patient left knee pain laterally is 75% but is having pain in her left patella. Patient is showing increased control of her left knee with exercise. Patient needs guidance to not push her foot outward with knee flexion and extension in sitting for heel slides. Patient has  met her STG's. Patient will benefit from skilled therapy to closely monitor for pain with exercise and technique to restore function.     Rehab Potential  Good    Clinical Impairments Affecting Rehab Potential  fall risk    PT Frequency  2x / week    PT Duration  8 weeks    PT Treatment/Interventions  ADLs/Self Care Home Management;Electrical Stimulation;Cryotherapy;Moist Heat;Iontophoresis 20m/ml Dexamethasone;Functional mobility training;Therapeutic activities;Therapeutic exercise;Patient/family education;Neuromuscular re-education;Manual techniques;Taping;Vasopneumatic Device    PT Next Visit Plan  see what MD says, measure edema, see if ionto helped the patella tendon.     PT Home Exercise Plan  [id number]    Consulted and Agree with Plan of Care  Patient       Patient will benefit from skilled therapeutic intervention in order to improve the following deficits and impairments:  Difficulty walking, Pain, Increased edema, Decreased activity tolerance, Decreased strength, Decreased range of motion, Impaired perceived functional ability  Visit Diagnosis: Acute pain of left knee  Difficulty in walking, not elsewhere classified  Muscle weakness (generalized)  Localized edema     Problem List Patient Active Problem List   Diagnosis Date Noted  . Excessive daytime sleepiness 10/17/2017  . Vitamin D deficiency 12/09/2016  . Attention deficit disorder 12/09/2016  . S[place]'s disease ([place] 05/03/2016  . Other headache syndrome 09/08/2015  . Transverse myelitis ([place] 05/06/2015  .  Dysesthesia 05/06/2015  . Neck pain 05/06/2015  . Occipital neuralgia 05/06/2015  . Spastic gait 02/05/2015  . Urinary frequency 02/05/2015  . Other fatigue 02/05/2015  . Multiple lung nodules on CT 09/24/2013  . Nipple discharge 08/04/2012  . 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 VOCAL CORD DISORDER 10/21/2009  . DYSPHAGIA, OROPHARYNGEAL PHASE 10/21/2009  . PULMONARY NODULE, RIGHT UPPER LOBE 01/22/2009  . DYSPNEA 09/24/2008  . BRONCHITIS, OBSTRUCTIVE CHRONIC, WITH EXACERBATION 09/10/2008  . G E R D 09/10/2008  . MULTIPLE SCLEROSIS 03/13/2008  . MIGRAINE, CHRONIC 03/13/2008  . CHRONIC RHINITIS 03/13/2008  . COUGH 03/13/2008    CEarlie Counts PT 01/10/19 9:31 AM   [facility] [address] [place] [address]: [phone]  Fax:  [phone] Name: RCIONNA COLLANTESMRN: [id number] of Birth: [birth year]

## 2019-01-11 ENCOUNTER — Encounter (INDEPENDENT_AMBULATORY_CARE_PROVIDER_SITE_OTHER): Payer: Self-pay | Admitting: Orthopedic Surgery

## 2019-01-11 ENCOUNTER — Ambulatory Visit (INDEPENDENT_AMBULATORY_CARE_PROVIDER_SITE_OTHER): Payer: PPO | Admitting: Physician Assistant

## 2019-01-11 VITALS — Ht 70.0 in | Wt 146.0 lb

## 2019-01-11 DIAGNOSIS — S83282S Other tear of lateral meniscus, current injury, left knee, sequela: Secondary | ICD-10-CM
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
## 2019-01-12 ENCOUNTER — Encounter (INDEPENDENT_AMBULATORY_CARE_PROVIDER_SITE_OTHER): Payer: Self-pay | Admitting: Physician Assistant

## 2019-01-15 ENCOUNTER — Ambulatory Visit: Payer: PPO | Admitting: Physical Therapy

## 2019-01-17 ENCOUNTER — Ambulatory Visit: Payer: PPO | Admitting: Physical Therapy

## 2019-01-17 ENCOUNTER — Encounter: Payer: Self-pay | Admitting: Physical Therapy

## 2019-01-17 DIAGNOSIS — M6281 Muscle weakness (generalized): Secondary | ICD-10-CM

## 2019-01-17 DIAGNOSIS — R262 Difficulty in walking, not elsewhere classified: Secondary | ICD-10-CM

## 2019-01-17 DIAGNOSIS — M25562 Pain in left knee: Secondary | ICD-10-CM

## 2019-01-17 DIAGNOSIS — R600 Localized edema: Secondary | ICD-10-CM

## 2019-01-17 NOTE — Patient Instructions (Signed)
Access Code: [email or web address]  URL: [email or web address]  Date: 01/17/2019  Prepared by: Earlie Counts   Exercises  Supine Quad Set - 10 reps - 1 sets - 3x daily - 7x weekly  Supine Hip and Knee Flexion PROM with Caregiver - 10 reps - 1 sets - 1x daily - 7x weekly  Hip Flexion with Knee Extension Caregiver PROM - 10 reps - 1 sets - 1x daily - 7x weekly  Supine Pelvic Tilt with Straight Leg Raise - 10 reps - 1 sets - 1x daily - 7x weekly  Long Sitting Quad Set - 10 reps - 1 sets - 5 sec hold - 1x daily - 7x weekly  Supine Quad Set on Towel Roll - 10 reps - 1 sets - 5 sec hold - 1x daily - 7x weekly  Seated Long Arc Quad with Hip Adduction - 10 reps - 1 sets - 1x daily - 7x weekly  Seated Knee Flexion Extension AROM - 10 reps - 1 sets - 1x daily - 7x weekly  Squat with Chair Touch - 5 reps - 1 sets - 5 sec hold - 1x daily - 7x weekly  [facility] [address], [place], [place] [address] Phone # [phone] Fax [phone]

## 2019-01-17 NOTE — Therapy (Addendum)
[facility] [address] [place], [place], [address] Phone: [phone]   Fax:  [phone]  Physical Therapy Treatment  Patient Details  Name: [name] MRN: [id number] Date of Birth: 1951-03-24 Referring Provider (PT): Neta Mends Rayburn PA   Encounter Date: 01/17/2019  PT End of Session - 01/17/19 0943    Visit Number  11    Date for PT Re-Evaluation  02/02/19    Authorization Type  [employer]     PT Start Time  0845    PT Stop Time  0935    PT Time Calculation (min)  50 min    Activity Tolerance  Patient tolerated treatment well    Behavior During Therapy  [facility] for tasks assessed/performed       Past Medical History:  Diagnosis Date  . Arthritis    back  . Dysesthesia    bilateral feet  . Family history of adverse reaction to anesthesia    mother -- ponv  . Fatigue   . Fecal incontinence   . Frequent falls    due to MS  . GERD (gastroesophageal reflux disease)   . Heart murmur   . History of colon polyps    2013;  2015  . 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 Migraines    right side   . Mitral valve prolapse    states no problem  . Multiple sclerosis [facility]) 1989   neurologist-  dr Felecia Shelling  . Neurogenic bladder   . Osteoporosis   . PONV (postoperative nausea and vomiting)   . Pulmonary nodule, right    pulmologist-  dr Milinda Hirschfeld--  stable per ct 05-17-2016  . Short of breath on exertion   . Sjogren's disease ([place])    dryness of eyes, mouth  . Spastic gait   . 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 Wears bilateral ankle braces    AFO braces for stability    Past Surgical History:  Procedure Laterality Date  . ANAL RECTAL MANOMETRY N/A 08/18/2016   Procedure: ANO RECTAL MANOMETRY;  Surgeon: Leighton Ruff, MD;  [facility];  Service: Endoscopy;  Laterality: N/A;  . ANTERIOR CERVICAL  DECOMP/DISCECTOMY FUSION  2004;   2003   C4 -- C5 (2004)/   C3 -- C4 (2003)  . APPENDECTOMY  06/22/2011   laparoscopic  . BILATERAL SALPINGOOPHORECTOMY  1982  . BLADDER SUSPENSION  1994   sling  . BREAST ENHANCEMENT SURGERY Bilateral   . BREAST IMPLANT REMOVAL Bilateral 12/03/2008  . BUNIONECTOMY    . CATARACT EXTRACTION W/ INTRAOCULAR LENS  IMPLANT, BILATERAL  2015  . ELBOW SURGERY Right   . HEMORROIDECTOMY  08/05/2010;  2013  . INSERTION SACRAL NERVE STIMULATOR TEST WIRE  09-28-2016   dr Marcello Moores  . INTERCOSTAL NERVE BLOCK  2015   occipital  . RECTAL ULTRASOUND N/A 08/18/2016   Procedure: RECTAL ULTRASOUND;  Surgeon: Leighton Ruff, MD;  [facility];  Service: Endoscopy;  Laterality: N/A;  . SHOULDER ARTHROSCOPY Left   . SHOULDER ARTHROSCOPY WITH DISTAL CLAVICLE RESECTION Right 09/21/2007   w/  Acrominoplasty,  Debridement Rotator Cuff,  CA ligament release  . TONSILLECTOMY  age 68  . TRIGGER FINGER RELEASE Left 01/16/2015   Procedure: LEFT THUMB TRIGGER RELEASE ;  Surgeon: Leanora Cover, MD;  Location: [place];  Service: Orthopedics;  Laterality: Left;  .  VAGINAL HYSTERECTOMY  1981    There were no vitals filed for this visit.  Subjective Assessment - 01/17/19 0851    Subjective  The ionto  patch has been healing.     Pertinent History  Multiple sclerosis; neurologic gait, dysesthesia, transverse myelitis  uses SPC prior to surgery;  decreased memory    Limitations  Walking;Standing;House hold activities;Lifting    Patient Stated Goals  bend my knee better, sit to stand easier from chair/toilet ; help swelling/pain     Currently in Pain?  No/denies    Multiple Pain Sites  No         [facility] PT Assessment - 01/17/19 0001      Assessment   Medical Diagnosis  left knee arthroscopy debridement meniscal tear and osteochondral defect    Referring Provider (PT)  Neta Mends Rayburn PA    Onset Date/Surgical Date  09/26/18    Hand Dominance  Right       Precautions   Precautions  Fall      Restrictions   Weight Bearing Restrictions  No      Home Environment   Living Environment  Private residence    Living Arrangements  Spouse/significant other      Prior Function   Level of Independence  Independent      Cognition   Overall Cognitive Status  Within Functional Limits for tasks assessed      Observation/Other Assessments   Focus on Therapeutic Outcomes (FOTO)   47% limitation      Circumferential Edema   Circumferential - Right  mid patella 38.5 cm;10cm above patella 41 cm      Posture/Postural Control   Posture/Postural Control  No significant limitations      AROM   Left Knee Extension  0    Left Knee Flexion  120      PROM   Left Knee Extension  0    Left Knee Flexion  130      Strength   Left Knee Flexion  4-/5    Left Knee Extension  3+/5      Palpation   Palpation comment  tenderness located in left patella tendon and lateral left knee                   [facility] Adult PT Treatment/Exercise - 01/17/19 0001      Knee/Hip Exercises: Aerobic   Nustep  level 2 x10  min; seat #10 only legs      Knee/Hip Exercises: Seated   Long Arc Quad  Left;AAROM;Strengthening;10 reps;1 set   therapist assist with control; popping in left patella   Long Arc Quad Limitations  ball squeeze    Heel Slides  Left;1 set;20 reps   slider under foot   Hamstring Curl  Strengthening;Left;1 set;15 reps   foot on slider   Hamstring Limitations  yellow band; ball squeeze   pain in left quad tendon     Knee/Hip Exercises: Supine   Quad Sets  Strengthening;Left;1 set;10 reps   hold 5 sec    Short Arc Quad Sets  AROM;Strengthening;Left;1 set;10 reps    Short Arc Quad Sets Limitations  ball squeeze    Straight Leg Raises  AAROM;Left;10 reps   helping to stabilize the pelvis   Straight Leg Raises Limitations  needs a strap to assist      Modalities   Modalities  Iontophoresis;Vasopneumatic      Iontophoresis   Type of  Iontophoresis  Dexamethasone  Location  left patella tendon and one on lateral left knee    Dose  1 ml, #2 for patella tendon and #3 for lateral left knee   skin intact   Time  6 hour patch             PT Education - 01/17/19 1242    Education Details  Access Code: [email or web address]     Person(s) Educated  Patient    Methods  Explanation;Demonstration;Handout    Comprehension  Returned demonstration;Verbalized understanding       PT Short Term Goals - 01/17/19 0903      PT SHORT TERM GOAL #1   Title  Pt will be demonstrate knowledge in basic self care strategies for knee pain and edema  control     Time  4    Period  Weeks    Status  Achieved      PT SHORT TERM GOAL #2   Title  Left knee pain improved by 30% with basic ADLs    Time  4    Period  Weeks    Status  Achieved      PT SHORT TERM GOAL #3   Title  Left knee flexion to 90 degrees actively for greater ease getting in/out of the car and from the chair    Time  4    Period  Weeks    Status  Achieved      PT SHORT TERM GOAL #4   Title  Decreased edema in peripatellar region to 38 cm circumferential measurement  needed for improved muscle activation and ROM     Time  4    Period  Weeks    Status  Achieved        PT Long Term Goals - 01/17/19 6659      PT LONG TERM GOAL #1   Title  The patient will be independent with safe self progression of HEP    Baseline  --    Time  8    Period  Weeks    Status  Achieved      PT LONG TERM GOAL #2   Title  Pt will be able to report left knee pain </= 3/10 when walking >/= 200 feet on community surfaces using her single point cane.     Baseline  pain level is 2-3/10    Time  8    Period  Weeks    Status  Achieved      PT LONG TERM GOAL #3   Title  The patient will have increased knee flexion ROM to 105 degrees needed for greater ease maneuvering her leg in/out of the shower    Baseline  130 degrees    Time  8    Period  Weeks    Status  Achieved      PT LONG  TERM GOAL #4   Title  The patient will have improved left LE strength and motor control to grossly 3-/5 for standing for longer periods of time and with sit to stand     Time  8    Period  Weeks    Status  Achieved      PT LONG TERM GOAL #5   Title  FOTO functional outcome score improved from 59% limitation to 44% indicating improved function with less pain     Baseline  47% limitation    Time  8    Period  Weeks    Status  Not Met  Plan - 01/17/19 0906    Clinical Impression Statement  Patient has met all her goals with the exception of FOTO score that is 47% instead of 44%. Patient left knee P/ROM is 130 degrees. Patient left knee extension is 0 degrees in sitting. Patient pain level with walking is 2-3/10. Patient reports her pain is 75% better. Patient is having trouble with squatting and will work with it at home. Patient has increased control with knee flexion and extension. Patient needs to use a strap to perform a straight leg raise in supine. Patient is ready to be discharged to a HEP today.     Rehab Potential  Good    Clinical Impairments Affecting Rehab Potential  fall risk    PT Treatment/Interventions  ADLs/Self Care Home Management;Electrical Stimulation;Cryotherapy;Moist Heat;Iontophoresis 4m/ml Dexamethasone;Functional mobility training;Therapeutic activities;Therapeutic exercise;Patient/family education;Neuromuscular re-education;Manual techniques;Taping;Vasopneumatic Device    PT Next Visit Plan  Discharge to HEP today    PT Home Exercise Plan  [email or web address]    Consulted and Agree with Plan of Care  Patient       Patient will benefit from skilled therapeutic intervention in order to improve the following deficits and impairments:  Difficulty walking, Pain, Increased edema, Decreased activity tolerance, Decreased strength, Decreased range of motion, Impaired perceived functional ability  Visit Diagnosis: Acute pain of left knee  Difficulty in walking, not  elsewhere classified  Muscle weakness (generalized)  Localized edema     Problem List Patient Active Problem List   Diagnosis Date Noted  . Excessive daytime sleepiness 10/17/2017  . Vitamin D deficiency 12/09/2016  . Attention deficit disorder 12/09/2016  . [place]'s disease ([place] 05/03/2016  . Other headache syndrome 09/08/2015  . [place]itis ([place] 05/06/2015  . Dysesthesia 05/06/2015  . Neck pain 05/06/2015  . Occipital neuralgia 05/06/2015  . Spastic gait 02/05/2015  . Urinary frequency 02/05/2015  . Other fatigue 02/05/2015  . Multiple lung nodules on CT 09/24/2013  . Nipple discharge 08/04/2012  . 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 VOCAL CORD DISORDER 10/21/2009  . DYSPHAGIA, OROPHARYNGEAL PHASE 10/21/2009  . PULMONARY NODULE, RIGHT UPPER LOBE 01/22/2009  . DYSPNEA 09/24/2008  . BRONCHITIS, OBSTRUCTIVE CHRONIC, WITH EXACERBATION 09/10/2008  . G E R D 09/10/2008  . MULTIPLE SCLEROSIS 03/13/2008  . MIGRAINE, CHRONIC 03/13/2008  . CHRONIC RHINITIS 03/13/2008  . COUGH 03/13/2008    CEarlie Counts PT 01/17/19 12:43 PM   [facility] [address] [place] [address]Phone: [phone]  Fax:  [phone] Name: RKATHALINA OSTERMANNMRN: [id number] of Birth: 708-08-1951 PHYSICAL THERAPY DISCHARGE SUMMARY  Visits from Start of Care: 11  Current functional level related to goals / functional outcomes: See above.    Remaining deficits: See above.   Education / Equipment: HEP Plan: Patient agrees to discharge.  Patient goals were met. Patient is being discharged due to meeting the stated rehab goals.  Thank you for the referral. CEarlie Counts PT 01/17/19 9:51 AM  ?????

## 2019-01-22 DIAGNOSIS — H02422 Myogenic ptosis of left eyelid: Secondary | ICD-10-CM | POA: Diagnosis not present

## 2019-01-22 DIAGNOSIS — H04223 Epiphora due to insufficient drainage, bilateral lacrimal glands: Secondary | ICD-10-CM | POA: Diagnosis not present

## 2019-01-22 DIAGNOSIS — H04332 Acute lacrimal canaliculitis of left lacrimal passage: Secondary | ICD-10-CM | POA: Diagnosis not present

## 2019-01-23 ENCOUNTER — Encounter: Payer: PPO | Admitting: Physical Therapy

## 2019-01-26 ENCOUNTER — Encounter: Payer: PPO | Admitting: Physical Therapy

## 2019-01-29 ENCOUNTER — Encounter: Payer: PPO | Admitting: Physical Therapy

## 2019-01-30 ENCOUNTER — Telehealth (INDEPENDENT_AMBULATORY_CARE_PROVIDER_SITE_OTHER): Payer: Self-pay | Admitting: Physician Assistant

## 2019-01-30 NOTE — Telephone Encounter (Signed)
The patient would like copies of her progress notes. She will call back during office hours to request copies to the front desk staff.

## 2019-01-30 NOTE — Telephone Encounter (Signed)
please see message below.

## 2019-01-30 NOTE — Telephone Encounter (Signed)
Patient called and would like to speak to Shawn at her convenience.  [phone].  Thank you.

## 2019-01-31 ENCOUNTER — Ambulatory Visit: Payer: PPO | Admitting: Physical Therapy

## 2019-01-31 NOTE — Telephone Encounter (Signed)
This pt is wanting copies of her progress notes from office visits. Can you do this for her or is it a charge?

## 2019-02-02 ENCOUNTER — Other Ambulatory Visit (HOSPITAL_COMMUNITY): Payer: Self-pay | Admitting: *Deleted

## 2019-02-05 ENCOUNTER — Ambulatory Visit (HOSPITAL_COMMUNITY)
Admission: RE | Admit: 2019-02-05 | Discharge: 2019-02-05 | Disposition: A | Discharge status: Home / Self Care | Payer: PPO | Source: Ambulatory Visit | Attending: Internal Medicine | Admitting: Internal Medicine

## 2019-02-05 DIAGNOSIS — M810 Age-related osteoporosis without current pathological fracture: Secondary | ICD-10-CM | POA: Diagnosis not present

## 2019-02-05 MED ORDER — DENOSUMAB 60 MG/ML ~~LOC~~ SOSY
60.0000 mg | PREFILLED_SYRINGE | Freq: Once | SUBCUTANEOUS | Status: AC
  Administered 2019-02-05: 60 mg via SUBCUTANEOUS

## 2019-02-05 MED ORDER — DENOSUMAB 60 MG/ML ~~LOC~~ SOSY
PREFILLED_SYRINGE | SUBCUTANEOUS | Status: AC
  Filled 2019-02-05: qty 1

## 2019-02-05 NOTE — Telephone Encounter (Signed)
Will process upon receipt of signed release form.

## 2019-02-09 ENCOUNTER — Other Ambulatory Visit: Payer: Self-pay | Admitting: Neurology

## 2019-02-09 MED ORDER — AMPHETAMINE-DEXTROAMPHETAMINE 15 MG PO TABS: ORAL_TABLET | ORAL | 0 refills | Status: DC

## 2019-02-09 NOTE — Telephone Encounter (Signed)
Patient is requesting a refill on her aderrall. DW

## 2019-02-15 ENCOUNTER — Encounter: Payer: Self-pay | Admitting: Neurology

## 2019-02-15 ENCOUNTER — Ambulatory Visit (INDEPENDENT_AMBULATORY_CARE_PROVIDER_SITE_OTHER): Payer: PPO | Admitting: Neurology

## 2019-02-15 ENCOUNTER — Telehealth: Payer: Self-pay | Admitting: Neurology

## 2019-02-15 VITALS — BP 141/80 | HR 94 | Ht 70.0 in | Wt 145.0 lb

## 2019-02-15 DIAGNOSIS — F988 Other specified behavioral and emotional disorders with onset usually occurring in childhood and adolescence: Secondary | ICD-10-CM | POA: Diagnosis not present

## 2019-02-15 DIAGNOSIS — R261 Paralytic gait: Secondary | ICD-10-CM

## 2019-02-15 DIAGNOSIS — M5481 Occipital neuralgia: Secondary | ICD-10-CM | POA: Diagnosis not present

## 2019-02-15 DIAGNOSIS — G35 Multiple sclerosis: Secondary | ICD-10-CM | POA: Diagnosis not present

## 2019-02-15 DIAGNOSIS — M542 Cervicalgia: Secondary | ICD-10-CM
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
## 2019-02-15 NOTE — Telephone Encounter (Signed)
I called pt back. Advised Dr. Felecia [name] happy to fit her in as long as she can get here before 12pm since we are closing early. She will head this way now. I added her to the schedule.

## 2019-02-15 NOTE — Telephone Encounter (Signed)
Pt is wanting a rt occipital nerve block. She's had pain in the back of the head for the past 4 days. She is aware the clinic closes at noon today and is closed tomorrow as well. Please call to advise

## 2019-03-05 DIAGNOSIS — H04223 Epiphora due to insufficient drainage, bilateral lacrimal glands: Secondary | ICD-10-CM | POA: Insufficient documentation

## 2019-03-05 DIAGNOSIS — H04332 Acute lacrimal canaliculitis of left lacrimal passage: Secondary | ICD-10-CM | POA: Insufficient documentation

## 2019-03-05 DIAGNOSIS — H02422 Myogenic ptosis of left eyelid: Secondary | ICD-10-CM | POA: Insufficient documentation

## 2019-03-14 ENCOUNTER — Encounter: Payer: Self-pay | Admitting: Neurology

## 2019-03-14 ENCOUNTER — Ambulatory Visit (INDEPENDENT_AMBULATORY_CARE_PROVIDER_SITE_OTHER): Payer: PPO | Admitting: Neurology

## 2019-03-14 ENCOUNTER — Other Ambulatory Visit: Payer: Self-pay

## 2019-03-14 VITALS — BP 100/60 | HR 101 | Ht 70.0 in | Wt 140.5 lb

## 2019-03-14 DIAGNOSIS — R413 Other amnesia: Secondary | ICD-10-CM | POA: Diagnosis not present

## 2019-03-14 DIAGNOSIS — R208 Other disturbances of skin sensation: Secondary | ICD-10-CM

## 2019-03-14 DIAGNOSIS — F988 Other specified behavioral and emotional disorders with onset usually occurring in childhood and adolescence: Secondary | ICD-10-CM | POA: Diagnosis not present

## 2019-03-14 DIAGNOSIS — G373 Acute transverse myelitis in demyelinating disease of central nervous system: Secondary | ICD-10-CM

## 2019-03-14 DIAGNOSIS — R350 Frequency of micturition: Secondary | ICD-10-CM

## 2019-03-14 DIAGNOSIS — R261 Paralytic gait: Secondary | ICD-10-CM | POA: Diagnosis not present

## 2019-03-14 DIAGNOSIS — E559 Vitamin D deficiency, unspecified: Secondary | ICD-10-CM

## 2019-03-14 DIAGNOSIS — M542 Cervicalgia: Secondary | ICD-10-CM

## 2019-03-14 MED ORDER — AMPHETAMINE-DEXTROAMPHETAMINE 15 MG PO TABS: ORAL_TABLET | ORAL | 0 refills | Status: DC

## 2019-03-14 MED ORDER — DONEPEZIL HCL 5 MG PO TABS: 5.0000 mg | ORAL_TABLET | Freq: Every day | ORAL | 5 refills | Status: DC
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
## 2019-03-15 ENCOUNTER — Telehealth: Payer: Self-pay | Admitting: Neurology

## 2019-03-15 ENCOUNTER — Telehealth: Payer: Self-pay | Admitting: *Deleted

## 2019-03-15 LAB — THYROID PANEL WITH TSH
Free Thyroxine Index: 1.9 (ref 1.2–4.9)
T3 Uptake Ratio: 27 % (ref 24–39)
T4, Total: 6.9 ug/dL (ref 4.5–12.0)
TSH: 2.16 u[IU]/mL (ref 0.450–4.500)

## 2019-03-15 LAB — VITAMIN D 25 HYDROXY (VIT D DEFICIENCY, FRACTURES): Vit D, 25-Hydroxy: 29.1 ng/mL — ABNORMAL LOW (ref 30.0–100.0)

## 2019-03-15 LAB — VITAMIN B12: Vitamin B-12: 499 pg/mL (ref 232–1245)

## 2019-03-15 NOTE — Telephone Encounter (Signed)
-----   Message from Britt Bottom, MD sent at 03/15/2019  2:50 PM EDT ----- Vit d was borderline low -- she should take the 50000 U twice a week x 2 weeks then continue with weekly dose     Other labs were

## 2019-03-15 NOTE — Telephone Encounter (Signed)
Health team order sent to GI. No auth they will reach out to the pt to schedule.  °

## 2019-03-15 NOTE — Telephone Encounter (Signed)
I called pt. Relayed results per Dr. Felecia Shelling note. She verbalized understanding.

## 2019-03-21 ENCOUNTER — Other Ambulatory Visit: Payer: PPO

## 2019-04-02 ENCOUNTER — Telehealth: Payer: Self-pay | Admitting: Neurology

## 2019-04-02 NOTE — Telephone Encounter (Signed)
Pt is needing instructions again on how to take her donepezil (ARICEPT) 5 MG tablet Please advise.

## 2019-04-03 NOTE — Telephone Encounter (Signed)
Called, LVM pt instructing her per rx Dr. Felecia Shelling wrote, she should take 1 tablet by mouth at bedtime. Gave [facility] phone number if she has further questions/concerns.

## 2019-04-04 DIAGNOSIS — E559 Vitamin D deficiency, unspecified: Secondary | ICD-10-CM | POA: Diagnosis not present

## 2019-04-04 DIAGNOSIS — E049 Nontoxic goiter, unspecified: Secondary | ICD-10-CM | POA: Diagnosis not present

## 2019-04-04 DIAGNOSIS — Z01818 Encounter for other preprocedural examination: Secondary | ICD-10-CM | POA: Diagnosis not present

## 2019-04-04 DIAGNOSIS — D638 Anemia in other chronic diseases classified elsewhere: Secondary | ICD-10-CM | POA: Diagnosis not present

## 2019-04-04 DIAGNOSIS — R590 Localized enlarged lymph nodes: Secondary | ICD-10-CM | POA: Diagnosis not present

## 2019-04-04 DIAGNOSIS — I10 Essential (primary) hypertension: Secondary | ICD-10-CM | POA: Diagnosis not present

## 2019-04-04 DIAGNOSIS — E119 Type 2 diabetes mellitus without complications: Secondary | ICD-10-CM | POA: Diagnosis not present

## 2019-04-04 DIAGNOSIS — M542 Cervicalgia: Secondary | ICD-10-CM | POA: Diagnosis not present

## 2019-04-04 DIAGNOSIS — C50912 Malignant neoplasm of unspecified site of left female breast: Secondary | ICD-10-CM | POA: Diagnosis not present

## 2019-04-11 ENCOUNTER — Telehealth: Payer: Self-pay | Admitting: *Deleted

## 2019-04-11 NOTE — Telephone Encounter (Signed)
Called pt. She has appt scheduled for 04/20/19. However, she was just seen 03/14/19. 04/20/19 appt made back 09/2018 and was never cx. She should f/u in 6 months from 03/14/19 per Dr. Felecia Shelling last [place] note. I made f/u for 09/17/19 at 830am with Dr. Felecia Shelling instead.

## 2019-04-17 ENCOUNTER — Other Ambulatory Visit: Payer: Self-pay | Admitting: Neurosurgery

## 2019-04-17 DIAGNOSIS — M4312 Spondylolisthesis, cervical region: Secondary | ICD-10-CM | POA: Diagnosis not present

## 2019-04-17 DIAGNOSIS — M4722 Other spondylosis with radiculopathy, cervical region: Secondary | ICD-10-CM | POA: Diagnosis not present

## 2019-04-17 DIAGNOSIS — M542 Cervicalgia: Secondary | ICD-10-CM | POA: Diagnosis not present

## 2019-04-17 DIAGNOSIS — M5030 Other cervical disc degeneration, unspecified cervical region: Secondary | ICD-10-CM | POA: Diagnosis not present

## 2019-04-17 DIAGNOSIS — M5412 Radiculopathy, cervical region: Secondary | ICD-10-CM | POA: Diagnosis not present

## 2019-04-18 ENCOUNTER — Telehealth: Payer: Self-pay

## 2019-04-18 NOTE — Telephone Encounter (Signed)
Phone call to patient to verify medication list and allergies for myelogram procedure. Pt instructed to hold Adderall for 48hrs prior to myelogram appointment time. Pt verbalized understanding.

## 2019-04-19 ENCOUNTER — Other Ambulatory Visit: Payer: PPO

## 2019-04-20 ENCOUNTER — Ambulatory Visit
Admission: RE | Admit: 2019-04-20 | Discharge: 2019-04-20 | Disposition: A | Discharge status: Home / Self Care | Payer: PPO | Source: Ambulatory Visit | Attending: Neurosurgery | Admitting: Neurosurgery

## 2019-04-20 ENCOUNTER — Other Ambulatory Visit: Payer: Self-pay

## 2019-04-20 ENCOUNTER — Ambulatory Visit: Payer: PPO | Admitting: Neurology

## 2019-04-20 DIAGNOSIS — M542 Cervicalgia: Secondary | ICD-10-CM | POA: Diagnosis not present

## 2019-04-20 DIAGNOSIS — M4722 Other spondylosis with radiculopathy, cervical region: Secondary | ICD-10-CM

## 2019-04-20 MED ORDER — DIAZEPAM 5 MG PO TABS
5.0000 mg | ORAL_TABLET | Freq: Once | ORAL | Status: AC
  Administered 2019-04-20: 5 mg via ORAL

## 2019-04-20 NOTE — Progress Notes (Signed)
Pt reports that she has been off of her adderal for 48hrs.

## 2019-04-20 NOTE — Discharge Instructions (Signed)
Myelogram Discharge Instructions  1. Go home and rest quietly for the next 24 hours.  It is important to lie flat for the next 24 hours.  Get up only to go to the restroom.  You may lie in the bed or on a couch on your back, your stomach, your left side or your right side.  You may have one pillow under your head.  You may have pillows between your knees while you are on your side or under your knees while you are on your back.  2. DO NOT drive today.  Recline the seat as far back as it will go, while still wearing your seat belt, on the way home.  3. You may get up to go to the bathroom as needed.  You may sit up for 10 minutes to eat.  You may resume your normal diet and medications unless otherwise indicated.  Drink lots of extra fluids today and tomorrow.  4. The incidence of headache, nausea, or vomiting is about 5% (one in 20 patients).  If you develop a headache, lie flat and drink plenty of fluids until the headache goes away.  Caffeinated beverages may be helpful.  If you develop severe nausea and vomiting or a headache that does not go away with flat bed rest, call [phone].  5. You may resume normal activities after your 24 hours of bed rest is over; however, do not exert yourself strongly or do any heavy lifting tomorrow. If when you get up you have a headache when standing, go back to bed and force fluids for another 24 hours.  6. Call your physician for a follow-up appointment.  The results of your myelogram will be sent directly to your physician by the following day.  7. If you have any questions or if complications develop after you arrive home, please call [phone].  Discharge instructions have been explained to the patient.  The patient, or the person responsible for the patient, fully understands these instructions  YOU MAY [place].

## 2019-04-25 DIAGNOSIS — I514 Myocarditis, unspecified: Secondary | ICD-10-CM

## 2019-04-25 DIAGNOSIS — R0902 Hypoxemia: Secondary | ICD-10-CM | POA: Diagnosis not present

## 2019-04-25 DIAGNOSIS — I351 Nonrheumatic aortic (valve) insufficiency: Secondary | ICD-10-CM | POA: Diagnosis not present

## 2019-04-25 DIAGNOSIS — R9431 Abnormal electrocardiogram [ECG] [EKG]: Secondary | ICD-10-CM | POA: Diagnosis not present

## 2019-04-25 DIAGNOSIS — R0609 Other forms of dyspnea: Secondary | ICD-10-CM | POA: Diagnosis not present

## 2019-04-25 DIAGNOSIS — I5181 Takotsubo syndrome: Secondary | ICD-10-CM | POA: Diagnosis not present

## 2019-04-25 DIAGNOSIS — R002 Palpitations: Secondary | ICD-10-CM | POA: Diagnosis not present

## 2019-04-25 DIAGNOSIS — Z20828 Contact with and (suspected) exposure to other viral communicable diseases: Secondary | ICD-10-CM | POA: Diagnosis not present

## 2019-04-25 DIAGNOSIS — I499 Cardiac arrhythmia, unspecified: Secondary | ICD-10-CM | POA: Diagnosis not present

## 2019-04-25 DIAGNOSIS — R7989 Other specified abnormal findings of blood chemistry: Secondary | ICD-10-CM | POA: Diagnosis not present

## 2019-04-25 DIAGNOSIS — M4802 Spinal stenosis, cervical region: Secondary | ICD-10-CM | POA: Diagnosis not present

## 2019-04-25 DIAGNOSIS — R55 Syncope and collapse: Secondary | ICD-10-CM | POA: Diagnosis not present

## 2019-04-25 DIAGNOSIS — R42 Dizziness and giddiness: Secondary | ICD-10-CM | POA: Diagnosis not present

## 2019-04-25 DIAGNOSIS — R000 Tachycardia, unspecified: Secondary | ICD-10-CM | POA: Diagnosis not present

## 2019-04-25 DIAGNOSIS — R0602 Shortness of breath: Secondary | ICD-10-CM | POA: Diagnosis not present

## 2019-04-25 DIAGNOSIS — G35 Multiple sclerosis: Secondary | ICD-10-CM | POA: Diagnosis not present

## 2019-04-25 DIAGNOSIS — R079 Chest pain, unspecified: Secondary | ICD-10-CM | POA: Diagnosis not present

## 2019-04-25 DIAGNOSIS — R0600 Dyspnea, unspecified: Secondary | ICD-10-CM | POA: Diagnosis not present

## 2019-04-25 DIAGNOSIS — F0390 Unspecified dementia without behavioral disturbance: Secondary | ICD-10-CM | POA: Diagnosis not present

## 2019-04-25 DIAGNOSIS — I213 ST elevation (STEMI) myocardial infarction of unspecified site: Secondary | ICD-10-CM | POA: Diagnosis not present

## 2019-04-25 DIAGNOSIS — Z6820 Body mass index (BMI) 20.0-20.9, adult: Secondary | ICD-10-CM | POA: Diagnosis not present

## 2019-04-25 HISTORY — DX: Myocarditis, unspecified: I51.4

## 2019-04-26 DIAGNOSIS — I213 ST elevation (STEMI) myocardial infarction of unspecified site: Secondary | ICD-10-CM | POA: Diagnosis not present

## 2019-04-26 DIAGNOSIS — I401 Isolated myocarditis: Secondary | ICD-10-CM

## 2019-04-26 DIAGNOSIS — I501 Left ventricular failure: Secondary | ICD-10-CM | POA: Diagnosis not present

## 2019-04-26 HISTORY — DX: Isolated myocarditis: I40.1

## 2019-04-30 ENCOUNTER — Other Ambulatory Visit: Payer: Self-pay | Admitting: Neurosurgery

## 2019-04-30 DIAGNOSIS — M5020 Other cervical disc displacement, unspecified cervical region: Secondary | ICD-10-CM | POA: Diagnosis not present

## 2019-04-30 DIAGNOSIS — M5030 Other cervical disc degeneration, unspecified cervical region: Secondary | ICD-10-CM | POA: Diagnosis not present

## 2019-04-30 DIAGNOSIS — M4312 Spondylolisthesis, cervical region: Secondary | ICD-10-CM | POA: Diagnosis not present

## 2019-04-30 DIAGNOSIS — M5412 Radiculopathy, cervical region: Secondary | ICD-10-CM | POA: Diagnosis not present

## 2019-05-07 DIAGNOSIS — R002 Palpitations: Secondary | ICD-10-CM | POA: Insufficient documentation

## 2019-05-07 DIAGNOSIS — I401 Isolated myocarditis: Secondary | ICD-10-CM | POA: Diagnosis not present

## 2019-05-09 NOTE — Pre-Procedure Instructions (Signed)
Libertie Hausler Janssens  05/09/2019      [id number] [place], [place] [address] [place] [address] Phone: [phone] Fax: [place], [place] [address] [place] [address] Phone: [phone] Fax: [phone]    Your procedure is scheduled on May 21  Report to [facility] Entrance A 5:30 A.M.  Call this number if you have problems the morning of surgery:  [phone]   Remember:  Do not eat or drink after midnight.    Take these medicines the morning of surgery with A SIP OF WATER :              Tylenol if needed             Carvedilol (coreg)             Famotidine (pepcid) if needed             Lamotrigine (lamictal)             Pantoprazole (protonix)             dalfampridine (4-aminopyridine)             Amantadine              7 days prior to surgery STOP taking any Aspirin (unless otherwise instructed by your surgeon), Aleve, Naproxen, Ibuprofen, Motrin, Advil, Goody's, BC's, all herbal medications, fish oil, and all vitamins.     Do not wear jewelry, make-up or nail polish.  Do not wear lotions, powders, or perfumes, or deodorant.  Do not shave 48 hours prior to surgery.  Men may shave face and neck.  Do not bring valuables to the hospital.  [facility] is not responsible for any belongings or valuables.  Contacts, dentures or bridgework may not be worn into surgery.  Leave your suitcase in the car.  After surgery it may be brought to your room.  For patients admitted to the hospital, discharge time will be determined by your treatment team.  Patients discharged the day of surgery will not be allowed to drive home.    Special instructions:  [place]- Preparing For Surgery  Before surgery, you can play an important role. Because skin is not sterile, your skin needs to be as free of germs as possible. You can reduce the number of germs  on your skin by washing with CHG (chlorahexidine gluconate) Soap before surgery.  CHG is an antiseptic cleaner which kills germs and bonds with the skin to continue killing germs even after washing.    Oral Hygiene is also important to reduce your risk of infection.  Remember - BRUSH YOUR TEETH THE MORNING OF SURGERY WITH YOUR REGULAR TOOTHPASTE  Please do not use if you have an allergy to CHG or antibacterial soaps. If your skin becomes reddened/irritated stop using the CHG.  Do not shave (including legs and underarms) for at least 48 hours prior to first CHG shower. It is OK to shave your face.  Please follow these instructions carefully.   1. Shower the NIGHT BEFORE SURGERY and the MORNING OF SURGERY with CHG.   2. If you chose to wash your hair, wash your hair first as usual with your normal shampoo.  3. After you shampoo, rinse your hair and body thoroughly to remove the shampoo.  4. Use CHG as you would any other liquid soap. You can apply CHG directly  to the skin and wash gently with a scrungie or a clean washcloth.   5. Apply the CHG Soap to your body ONLY FROM THE NECK DOWN.  Do not use on open wounds or open sores. Avoid contact with your eyes, ears, mouth and genitals (private parts). Wash Face and genitals (private parts)  with your normal soap.  6. Wash thoroughly, paying special attention to the area where your surgery will be performed.  7. Thoroughly rinse your body with warm water from the neck down.  8. DO NOT shower/wash with your normal soap after using and rinsing off the CHG Soap.  9. Pat yourself dry with a CLEAN TOWEL.  10. Wear CLEAN PAJAMAS to bed the night before surgery, wear comfortable clothes the morning of surgery  11. Place CLEAN SHEETS on your bed the night of your first shower and DO NOT SLEEP WITH PETS.    Day of Surgery:  Do not apply any deodorants/lotions.  Please wear clean clothes to the hospital/surgery center.   Remember to brush your  teeth WITH YOUR REGULAR TOOTHPASTE.    Please read over the following fact sheets that you were given. Coughing and Deep Breathing and Surgical Site Infection Prevention

## 2019-05-10 ENCOUNTER — Encounter (HOSPITAL_COMMUNITY)
Admission: RE | Admit: 2019-05-10 | Discharge: 2019-05-10 | Disposition: A | Discharge status: Home / Self Care | Payer: PPO | Source: Ambulatory Visit | Attending: Neurosurgery | Admitting: Neurosurgery

## 2019-05-10 ENCOUNTER — Encounter (HOSPITAL_COMMUNITY): Payer: Self-pay

## 2019-05-10 ENCOUNTER — Other Ambulatory Visit: Payer: Self-pay

## 2019-05-10 DIAGNOSIS — G35 Multiple sclerosis: Secondary | ICD-10-CM | POA: Insufficient documentation

## 2019-05-10 DIAGNOSIS — I341 Nonrheumatic mitral (valve) prolapse: Secondary | ICD-10-CM | POA: Insufficient documentation

## 2019-05-10 DIAGNOSIS — M5020 Other cervical disc displacement, unspecified cervical region: Secondary | ICD-10-CM | POA: Diagnosis not present

## 2019-05-10 DIAGNOSIS — J45909 Unspecified asthma, uncomplicated: Secondary | ICD-10-CM | POA: Insufficient documentation

## 2019-05-10 DIAGNOSIS — Z79899 Other long term (current) drug therapy: Secondary | ICD-10-CM | POA: Diagnosis not present

## 2019-05-10 DIAGNOSIS — K219 Gastro-esophageal reflux disease without esophagitis: Secondary | ICD-10-CM | POA: Insufficient documentation

## 2019-05-10 DIAGNOSIS — M3500 Sicca syndrome, unspecified: Secondary | ICD-10-CM | POA: Insufficient documentation

## 2019-05-10 DIAGNOSIS — Z01818 Encounter for other preprocedural examination: Secondary | ICD-10-CM | POA: Insufficient documentation

## 2019-05-10 DIAGNOSIS — M1990 Unspecified osteoarthritis, unspecified site: Secondary | ICD-10-CM | POA: Diagnosis not present

## 2019-05-10 DIAGNOSIS — Z7901 Long term (current) use of anticoagulants: Secondary | ICD-10-CM | POA: Insufficient documentation

## 2019-05-10 HISTORY — DX: Unspecified asthma, uncomplicated: J45.909

## 2019-05-10 HISTORY — DX: Personal history of other diseases of the digestive system: Z87.19

## 2019-05-10 HISTORY — DX: Pneumonia, unspecified organism: J18.9

## 2019-05-10 HISTORY — DX: Dyspnea, unspecified: R06.00

## 2019-05-10 LAB — CBC
HCT: 44.1 % (ref 36.0–46.0)
Hemoglobin: 14.5 g/dL (ref 12.0–15.0)
MCH: 31.3 pg (ref 26.0–34.0)
MCHC: 32.9 g/dL (ref 30.0–36.0)
MCV: 95 fL (ref 80.0–100.0)
Platelets: 289 10*3/uL (ref 150–400)
RBC: 4.64 MIL/uL (ref 3.87–5.11)
RDW: 13.1 % (ref 11.5–15.5)
WBC: 6.6 10*3/uL (ref 4.0–10.5)
nRBC: 0 % (ref 0.0–0.2)

## 2019-05-10 LAB — BASIC METABOLIC PANEL
Anion gap: 7 (ref 5–15)
BUN: 15 mg/dL (ref 8–23)
CO2: 31 mmol/L (ref 22–32)
Calcium: 9.7 mg/dL (ref 8.9–10.3)
Chloride: 103 mmol/L (ref 98–111)
Creatinine, Ser: 0.95 mg/dL (ref 0.44–1.00)
GFR calc Af Amer: >60 mL/min (ref >60)
GFR calc non Af Amer: >60 mL/min (ref >60)
Glucose, Bld: 105 mg/dL — ABNORMAL HIGH (ref 70–99)
Potassium: 4 mmol/L (ref 3.5–5.1)
Sodium: 141 mmol/L (ref 135–145)

## 2019-05-10 LAB — TYPE AND SCREEN
ABO/RH(D): O NEG
Antibody Screen: NEGATIVE

## 2019-05-10 LAB — SURGICAL PCR SCREEN
MRSA, PCR: NEGATIVE
Staphylococcus aureus: NEGATIVE

## 2019-05-10 LAB — ABO/RH: ABO/RH(D): O NEG

## 2019-05-10 NOTE — Anesthesia Preprocedure Evaluation (Addendum)
Anesthesia Evaluation  Patient identified by MRN, date of birth, ID band Patient awake    Reviewed: Allergy & Precautions, NPO status , Patient's Chart, lab work & pertinent test results  History of Anesthesia Complications (+) PONV  Airway Mallampati: II  TM Distance: >3 FB Neck ROM: Full    Dental no notable dental hx.    Pulmonary asthma , COPD,    Pulmonary exam normal breath sounds clear to auscultation       Cardiovascular negative cardio ROS Normal cardiovascular exam Rhythm:Regular Rate:Normal     Neuro/Psych MS negative psych ROS   GI/Hepatic Neg liver ROS, GERD  ,  Endo/Other  negative endocrine ROS  Renal/GU negative Renal ROS  negative genitourinary   Musculoskeletal negative musculoskeletal ROS (+)   Abdominal   Peds negative pediatric ROS (+)  Hematology negative hematology ROS (+)   Anesthesia Other Findings   Reproductive/Obstetrics negative OB ROS                             Anesthesia Physical Anesthesia Plan  ASA: III  Anesthesia Plan: General   Post-op Pain Management:    Induction: Intravenous  PONV Risk Score and Plan: 4 or greater and Ondansetron, Dexamethasone, Treatment may vary due to age or medical condition and Scopolamine patch - Pre-op  Airway Management Planned: Oral ETT  Additional Equipment:   Intra-op Plan:   Post-operative Plan: Extubation in OR  Informed Consent: I have reviewed the patients History and Physical, chart, labs and discussed the procedure including the risks, benefits and alternatives for the proposed anesthesia with the patient or authorized representative who has indicated his/her understanding and acceptance.     Dental advisory given  Plan Discussed with: CRNA and Surgeon  Anesthesia Plan Comments: (See PAT note by Karoline Caldwell, PA-C )       Anesthesia Quick Evaluation

## 2019-05-10 NOTE — Progress Notes (Signed)
Anesthesia Chart Review:  Case:  [id number] Date/Time:  05/17/19 0715   Procedure:  REMOVAL OF TETHER CERVICAL PLATE, ANTERIOR CERVICAL DECOMPRESSION/DISCECTOMY FUSION CERVICAL 5- CERVICAL 6, CERVICAL 6- CERVICAL 7 (N/A ) - REMOVAL OF TETHER CERVICAL PLATE, ANTERIOR CERVICAL DECOMPRESSION/DISCECTOMY FUSION CERVICAL 5- CERVICAL 6, CERVICAL 6- CERVICAL 7   Anesthesia type:  General   Pre-op diagnosis:  HERNIATED NUCLEUS PULPOSUS, CERVICAL   Location:  [facility] OR ROOM 19 / [facility]   Surgeon:  Jovita Gamma, MD      DISCUSSION: 68 yo female never smoker. 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  Pt with recent admission 4/29-4/30/20 for episode of viral myocarditis vs stress cardiomyopathy. She follows with cardiologist Dr. Bishop Limbo at [facility]. He addressed this episode as well as surgical clearance in his note from 05/07/19 which is copied below:  "Myocarditis versus stress cardiomyopathy, acute systolic heart failure. The patient presented to the emergency department on 04/25/2019 with an abnormal EKG and was referred for an emergency heart catheterization for possible STEMI. She had no coronary artery disease at all. Her troponin levels were abnormal and her imaging tests showed LV dysfunction consistent with either stress cardiomyopathy or focal viral myocarditis. The EF was 45% with anterior and apical hypokinesis. Given the pattern of her presentation, I think it is more likely that this is acute viral myocarditis.  The patient requested an early follow-up appointment with me today because she would like to have surgery for her cervical spine. She says that her spine surgeon is concerned about instability of her surgical spine and that she needs to have urgent surgery. I explained that there is some added risk in any kind of surgery in a patient who has a recent cardiac injury. However, she appears well  compensated with respect to her heart failure. She is well perfused and has no edema. If the surgery needs to happen on an urgent or emergent basis, I think the patient will be all right as long as the anesthesiologist are aware of her heart failure.  She is on carvedilol and losartan for LV dysfunction and to promote ventricular remodeling. I do not think that she needs a loop diuretic, given that she has no edema or other sign of volume overload. I advised regular exercise, as much as she is able to tolerate given her multiple sclerosis and cervical spine problems."  Of note, she was tested for COVID19 on 4/29 and per notes in care everywhere "She had a COVID test which was negative."  Pt has EKGs in care everywhere dated 05/01/19 and 04/25/19. Both show sinus rhythm with Acute MI/STEMI. Pt had a cath 04/25/19 with normal coronaries. Need for repeat EKG discussed with Dr. Lissa Hoard. He recommended repeating EKG on DOS.  Anticipate she can proceed as planned barring acute status change.   VS: BP 132/85   Pulse 79   Temp 36.9 C   Resp 18   Ht 5\' 10"  (1.778 m)   Wt 65.1 kg   SpO2 99%   BMI 20.59 kg/m   PROVIDERS: Crist Infante, MD is PCP  Bishop Limbo, MD is Cardiologist  Arlice Colt, MD is Neurologist  LABS: Labs reviewed: Acceptable for surgery. (all labs ordered are listed, but only abnormal results are displayed)  Labs Reviewed  BASIC METABOLIC PANEL - Abnormal; Notable for the following components:      Result Value   Glucose, Bld 105 (*)  All other components within normal limits  SURGICAL PCR SCREEN  CBC  TYPE AND SCREEN  ABO/RH     IMAGES: PORTABLE CHEST 1 VIEW 04/25/19 (care everywhere):  COMPARISON: None.  FINDINGS: Cardiac silhouette and mediastinal contours normal in appearance for the AP portable technique. Pulmonary parenchyma clear. Bronchovascular markings normal. Pulmonary vascularity normal. No pneumothorax. No visible pleural  effusions.  IMPRESSION: No acute cardiopulmonary disease.  EKG: 05/01/2019 (care everywhere): Sinus rhythm. Rate 87. Possible Left atrial enlargement. ST elevation consider inferolateral injury or acute infarct. ** ** ACUTE MI / STEMI ** **  04/25/2019 (care everywhere): Sinus rhythm. Rate 89. Possible Left atrial enlargement. ** ** ACUTE MI / STEMI ** **  CV: TTE 04/25/19 (care everywhere): Summary Limited echo. Mildly reduced LV systolic function. Estimated LV ejection fraction 45% Mid-chamber hypokinesis. Normal right ventricle structure and function. Mild aortic insufficiency. There is no prior echo for comparison. Signature  Cath 04/25/19 (care everywhere): INDICATION: Possible STEMI 1. Normal coronary arteriogram. 2. Abnormal Left Ventriculogram with mid-chamber hypokinesis. This pattern can be seen with Stress Cardiomyopathy ("mid-chamber Takotsubo"), or myopericarditis. Diagnostic Procedure Recommendations Continue evaluation for dyspnea. Echocardiogram. There is no need for anticoagulation or antiplatelet therapy for a cardiac indication. Start carvedilol and lisinopril to promote myocardial recovery.  Past Medical History:  Diagnosis Date  . Arthritis    back  . Asthma    20 yrs., ago one episode  . Dysesthesia    bilateral feet  . Dyspnea   . Family history of adverse reaction to anesthesia    mother -- ponv  . Fatigue   . Fecal incontinence   . Frequent falls    due to MS  . GERD (gastroesophageal reflux disease)   . Heart murmur    first noticed 41 yrs. when pregnant, can't hear it now  . History of colon polyps    2013;  2015  . History of hiatal hernia   . 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 Migraines    right side   . Mitral valve prolapse    states no problem  . Multiple sclerosis [facility]) 1989   neurologist-  dr Felecia Shelling  . Myocarditis ([place]) 04/25/2019  . Neurogenic  bladder   . Osteoporosis   . Pneumonia    h/o of pneu.  Marland Kitchen PONV (postoperative nausea and vomiting)   . Pulmonary nodule, right    pulmologist-  dr Milinda Hirschfeld--  stable per ct 05-17-2016  . Short of breath on exertion   . Sjogren's disease ([place])    dryness of eyes, mouth  . Spastic gait   . Transverse myelitis ([place])    T4  . Wears bilateral ankle braces    AFO braces for stability    Past Surgical History:  Procedure Laterality Date  . ANAL RECTAL MANOMETRY N/A 08/18/2016   Procedure: ANO RECTAL MANOMETRY;  Surgeon: Leighton Ruff, MD;  Location: [facility];  Service: Endoscopy;  Laterality: N/A;  . ANTERIOR CERVICAL DECOMP/DISCECTOMY FUSION  2004;   2003   C4 -- C5 (2004)/   C3 -- C4 (2003)  . APPENDECTOMY  06/22/2011   laparoscopic  . BILATERAL SALPINGOOPHORECTOMY  1982  . BLADDER SUSPENSION  1994   sling  . BREAST ENHANCEMENT SURGERY Bilateral   . BREAST IMPLANT REMOVAL Bilateral 12/03/2008  . BUNIONECTOMY    . CATARACT EXTRACTION W/ INTRAOCULAR LENS  IMPLANT, BILATERAL  2015  .  ELBOW SURGERY Right   . HEMORROIDECTOMY  08/05/2010;  2013  . INSERTION SACRAL NERVE STIMULATOR TEST WIRE  09-28-2016   dr Marcello Moores  . INTERCOSTAL NERVE BLOCK  2015   occipital  . MENISCUS REPAIR Left 09/26/2018  . RECTAL ULTRASOUND N/A 08/18/2016   Procedure: RECTAL ULTRASOUND;  Surgeon: Leighton Ruff, MD;  Location: [facility];  Service: Endoscopy;  Laterality: N/A;  . SHOULDER ARTHROSCOPY Left   . SHOULDER ARTHROSCOPY WITH DISTAL CLAVICLE RESECTION Right 09/21/2007   w/  Acrominoplasty,  Debridement Rotator Cuff,  CA ligament release  . TONSILLECTOMY  age 53  . TRIGGER FINGER RELEASE Left 01/16/2015   Procedure: LEFT THUMB TRIGGER RELEASE ;  Surgeon: Leanora Cover, MD;  Location: [facility];  Service: Orthopedics;  Laterality: Left;  Marland Kitchen VAGINAL HYSTERECTOMY  1981    MEDICATIONS: . acetaminophen (TYLENOL) 500 MG tablet  . Amantadine HCl 100 MG tablet  .  amphetamine-dextroamphetamine (ADDERALL) 15 MG tablet  . calcium carbonate (OS-CAL) 600 MG TABS tablet  . carvedilol (COREG) 3.125 MG tablet  . Dalfampridine (4-AMINOPYRIDINE) POWD  . denosumab (PROLIA) 60 MG/ML SOLN injection  . donepezil (ARICEPT) 5 MG tablet  . famotidine (PEPCID) 10 MG tablet  . lamoTRIgine (LAMICTAL) 150 MG tablet  . losartan (COZAAR) 25 MG tablet  . Meclizine HCl 25 MG CHEW  . Multiple Vitamins-Minerals (MULTIVITAMIN PO)  . pantoprazole (PROTONIX) 40 MG tablet  . rOPINIRole (REQUIP) 0.25 MG tablet  . Vitamin D, Ergocalciferol, (DRISDOL) 50000 UNITS CAPS capsule   No current facility-administered medications for this encounter.

## 2019-05-10 NOTE — Progress Notes (Addendum)
PCP - [place] in [facility]  Chest x-ray - na EKG - requested and will get one DOS Stress Test -  ECHO - 04-25-19 Cardiac Cath - 04-25-19  Sleep Study - na CPAP -   Fasting Blood Sugar - na Checks Blood Sugar _____ times a day  Blood Thinner Instructions: Aspirin Instructions:na  Pt. Had a negative coronavirus testing @ [facility] in [facility] 04-25-19  Anesthesia review:  Heart hx./previous admission [facility] in [facility]  Patient denies shortness of breath, fever, cough and chest pain at PAT appointment   Patient verbalized understanding of instructions that were given to them at the PAT appointment. Patient was also instructed that they will need to review over the PAT instructions again at home before surgery.

## 2019-05-10 NOTE — Progress Notes (Signed)
  Coronavirus Screening  Have you experienced the following symptoms:  Cough no Fever (>100.4F)  no Runny nose no Sore throat no Difficulty breathing/shortness of breath  no  Have you or a family member traveled in the last 14 days and where? no   If the patient indicates "YES" to the above questions, their PAT will be rescheduled to limit the exposure to others and, the surgeon will be notified. THE PATIENT WILL NEED TO BE ASYMPTOMATIC FOR 14 DAYS.   If the patient is not experiencing any of these symptoms, the PAT nurse will instruct them to NOT bring anyone with them to their appointment since they may have these symptoms or traveled as well.   Please remind your patients and families that hospital visitation restrictions are in effect and the importance of the restrictions.  

## 2019-05-14 ENCOUNTER — Other Ambulatory Visit (HOSPITAL_COMMUNITY)
Admission: RE | Admit: 2019-05-14 | Discharge: 2019-05-14 | Disposition: A | Discharge status: Home / Self Care | Payer: PPO | Source: Ambulatory Visit | Attending: Neurosurgery | Admitting: Neurosurgery

## 2019-05-14 DIAGNOSIS — Z1159 Encounter for screening for other viral diseases: Secondary | ICD-10-CM | POA: Diagnosis not present

## 2019-05-14 DIAGNOSIS — Z01812 Encounter for preprocedural laboratory examination: Secondary | ICD-10-CM | POA: Insufficient documentation

## 2019-05-15 ENCOUNTER — Other Ambulatory Visit: Payer: Self-pay | Admitting: Neurosurgery

## 2019-05-15 ENCOUNTER — Other Ambulatory Visit: Payer: PPO

## 2019-05-15 LAB — NOVEL CORONAVIRUS, NAA (HOSP ORDER, SEND-OUT TO REF LAB; TAT 18-24 HRS): SARS-CoV-2, NAA: NOT DETECTED

## 2019-05-17 ENCOUNTER — Encounter (HOSPITAL_COMMUNITY)
Admission: RE | Disposition: A | Discharge status: Home / Self Care | Payer: Self-pay | Source: Home / Self Care | Attending: Neurosurgery

## 2019-05-17 ENCOUNTER — Observation Stay (HOSPITAL_COMMUNITY): Payer: PPO | Admitting: Certified Registered"

## 2019-05-17 ENCOUNTER — Ambulatory Visit (HOSPITAL_COMMUNITY): Payer: PPO

## 2019-05-17 ENCOUNTER — Inpatient Hospital Stay (HOSPITAL_COMMUNITY)
Admission: RE | Admit: 2019-05-17 | Discharge: 2019-05-19 | DRG: 472 | Disposition: A | Discharge status: Home / Self Care | Payer: PPO | Attending: Neurosurgery | Admitting: Neurosurgery

## 2019-05-17 ENCOUNTER — Other Ambulatory Visit: Payer: Self-pay

## 2019-05-17 ENCOUNTER — Ambulatory Visit (HOSPITAL_COMMUNITY): Payer: PPO | Admitting: Physician Assistant

## 2019-05-17 ENCOUNTER — Ambulatory Visit (HOSPITAL_COMMUNITY): Payer: PPO | Admitting: Anesthesiology

## 2019-05-17 ENCOUNTER — Encounter (HOSPITAL_COMMUNITY): Payer: Self-pay

## 2019-05-17 DIAGNOSIS — Z981 Arthrodesis status: Secondary | ICD-10-CM | POA: Diagnosis not present

## 2019-05-17 DIAGNOSIS — N319 Neuromuscular dysfunction of bladder, unspecified: Secondary | ICD-10-CM | POA: Diagnosis present

## 2019-05-17 DIAGNOSIS — Z91018 Allergy to other foods: Secondary | ICD-10-CM

## 2019-05-17 DIAGNOSIS — Z4789 Encounter for other orthopedic aftercare: Secondary | ICD-10-CM | POA: Diagnosis not present

## 2019-05-17 DIAGNOSIS — M1990 Unspecified osteoarthritis, unspecified site: Secondary | ICD-10-CM | POA: Diagnosis not present

## 2019-05-17 DIAGNOSIS — Z888 Allergy status to other drugs, medicaments and biological substances status: Secondary | ICD-10-CM

## 2019-05-17 DIAGNOSIS — Z91040 Latex allergy status: Secondary | ICD-10-CM | POA: Diagnosis not present

## 2019-05-17 DIAGNOSIS — M50122 Cervical disc disorder at C5-C6 level with radiculopathy: Principal | ICD-10-CM | POA: Diagnosis present

## 2019-05-17 DIAGNOSIS — M3500 Sicca syndrome, unspecified: Secondary | ICD-10-CM | POA: Diagnosis present

## 2019-05-17 DIAGNOSIS — K219 Gastro-esophageal reflux disease without esophagitis: Secondary | ICD-10-CM | POA: Diagnosis present

## 2019-05-17 DIAGNOSIS — Z7722 Contact with and (suspected) exposure to environmental tobacco smoke (acute) (chronic): Secondary | ICD-10-CM | POA: Diagnosis present

## 2019-05-17 DIAGNOSIS — M96840 Postprocedural hematoma of a musculoskeletal structure following a musculoskeletal system procedure: Secondary | ICD-10-CM | POA: Diagnosis not present

## 2019-05-17 DIAGNOSIS — M810 Age-related osteoporosis without current pathological fracture: Secondary | ICD-10-CM | POA: Diagnosis present

## 2019-05-17 DIAGNOSIS — Z8589 Personal history of malignant neoplasm of other organs and systems: Secondary | ICD-10-CM | POA: Diagnosis not present

## 2019-05-17 DIAGNOSIS — M4722 Other spondylosis with radiculopathy, cervical region: Secondary | ICD-10-CM | POA: Diagnosis present

## 2019-05-17 DIAGNOSIS — Z419 Encounter for procedure for purposes other than remedying health state, unspecified: Secondary | ICD-10-CM

## 2019-05-17 DIAGNOSIS — Z885 Allergy status to narcotic agent status: Secondary | ICD-10-CM

## 2019-05-17 DIAGNOSIS — Z8249 Family history of ischemic heart disease and other diseases of the circulatory system: Secondary | ICD-10-CM

## 2019-05-17 DIAGNOSIS — G43909 Migraine, unspecified, not intractable, without status migrainosus: Secondary | ICD-10-CM | POA: Diagnosis not present

## 2019-05-17 DIAGNOSIS — M4312 Spondylolisthesis, cervical region: Secondary | ICD-10-CM | POA: Diagnosis present

## 2019-05-17 DIAGNOSIS — J45909 Unspecified asthma, uncomplicated: Secondary | ICD-10-CM | POA: Diagnosis not present

## 2019-05-17 DIAGNOSIS — Z801 Family history of malignant neoplasm of trachea, bronchus and lung: Secondary | ICD-10-CM | POA: Diagnosis not present

## 2019-05-17 DIAGNOSIS — Z823 Family history of stroke: Secondary | ICD-10-CM

## 2019-05-17 DIAGNOSIS — Y838 Other surgical procedures as the cause of abnormal reaction of the patient, or of later complication, without mention of misadventure at the time of the procedure: Secondary | ICD-10-CM | POA: Diagnosis not present

## 2019-05-17 DIAGNOSIS — M96842 Postprocedural seroma of a musculoskeletal structure following a musculoskeletal system procedure: Secondary | ICD-10-CM | POA: Diagnosis not present

## 2019-05-17 DIAGNOSIS — M5020 Other cervical disc displacement, unspecified cervical region: Secondary | ICD-10-CM | POA: Diagnosis present

## 2019-05-17 DIAGNOSIS — G35 Multiple sclerosis: Secondary | ICD-10-CM | POA: Diagnosis present

## 2019-05-17 DIAGNOSIS — Z803 Family history of malignant neoplasm of breast: Secondary | ICD-10-CM

## 2019-05-17 HISTORY — PX: ANTERIOR CERVICAL DECOMP/DISCECTOMY FUSION: SHX1161

## 2019-05-17 HISTORY — DX: Other cervical disc displacement, unspecified cervical region: M50.20

## 2019-05-17 SURGERY — ANTERIOR CERVICAL DECOMPRESSION/DISCECTOMY FUSION 2 LEVEL/HARDWARE REMOVAL
Anesthesia: General | Site: Spine Cervical

## 2019-05-17 SURGERY — ANTERIOR CERVICAL DECOMPRESSION/DISCECTOMY FUSION 1 LEVEL
Anesthesia: General | Site: Spine Cervical

## 2019-05-17 MED ORDER — HYDROXYZINE HCL 25 MG PO TABS: 50.0000 mg | ORAL_TABLET | ORAL | Status: DC | PRN

## 2019-05-17 MED ORDER — ONDANSETRON HCL 4 MG/2ML IJ SOLN
INTRAMUSCULAR | Status: AC
  Filled 2019-05-17: qty 2

## 2019-05-17 MED ORDER — ROCURONIUM BROMIDE 10 MG/ML (PF) SYRINGE
PREFILLED_SYRINGE | INTRAVENOUS | Status: AC
  Filled 2019-05-17: qty 10

## 2019-05-17 MED ORDER — PROPOFOL 1000 MG/100ML IV EMUL
INTRAVENOUS | Status: AC
  Filled 2019-05-17: qty 100

## 2019-05-17 MED ORDER — THROMBIN 20000 UNITS EX SOLR
CUTANEOUS | Status: DC | PRN
  Administered 2019-05-17: 20 mL via TOPICAL

## 2019-05-17 MED ORDER — SODIUM CHLORIDE 0.9% FLUSH
3.0000 mL | Freq: Two times a day (BID) | INTRAVENOUS | Status: DC
  Administered 2019-05-17 – 2019-05-18 (×3): 3 mL via INTRAVENOUS

## 2019-05-17 MED ORDER — CEFAZOLIN SODIUM 1 G IJ SOLR
INTRAMUSCULAR | Status: AC
  Filled 2019-05-17: qty 20

## 2019-05-17 MED ORDER — LACTATED RINGERS IV SOLN
INTRAVENOUS | Status: DC | PRN
  Administered 2019-05-17: 14:00:00 via INTRAVENOUS

## 2019-05-17 MED ORDER — KETOROLAC TROMETHAMINE 30 MG/ML IJ SOLN: 15.0000 mg | Freq: Four times a day (QID) | INTRAMUSCULAR | Status: DC

## 2019-05-17 MED ORDER — SODIUM CHLORIDE 0.9 % IV SOLN
INTRAVENOUS | Status: DC | PRN
  Administered 2019-05-17: 500 mL

## 2019-05-17 MED ORDER — THROMBIN 5000 UNITS EX SOLR
CUTANEOUS | Status: AC
  Filled 2019-05-17: qty 5000

## 2019-05-17 MED ORDER — PROPOFOL 10 MG/ML IV BOLUS
INTRAVENOUS | Status: AC
  Filled 2019-05-17: qty 20

## 2019-05-17 MED ORDER — ACETAMINOPHEN 325 MG PO TABS
650.0000 mg | ORAL_TABLET | ORAL | Status: DC | PRN
  Administered 2019-05-17 – 2019-05-19 (×6): 650 mg via ORAL
  Filled 2019-05-17 (×6): qty 2

## 2019-05-17 MED ORDER — LOSARTAN POTASSIUM 25 MG PO TABS
12.5000 mg | ORAL_TABLET | Freq: Every day | ORAL | Status: DC
  Administered 2019-05-18 – 2019-05-19 (×2): 12.5 mg via ORAL
  Filled 2019-05-17 (×3): qty 0.5

## 2019-05-17 MED ORDER — ONDANSETRON HCL 4 MG/2ML IJ SOLN
INTRAMUSCULAR | Status: DC | PRN
  Administered 2019-05-17: 4 mg via INTRAVENOUS

## 2019-05-17 MED ORDER — SUCCINYLCHOLINE CHLORIDE 200 MG/10ML IV SOSY
PREFILLED_SYRINGE | INTRAVENOUS | Status: AC
  Filled 2019-05-17: qty 10

## 2019-05-17 MED ORDER — LIDOCAINE 2% (20 MG/ML) 5 ML SYRINGE
INTRAMUSCULAR | Status: AC
  Filled 2019-05-17: qty 5

## 2019-05-17 MED ORDER — AMPHETAMINE-DEXTROAMPHETAMINE 5 MG PO TABS
15.0000 mg | ORAL_TABLET | Freq: Two times a day (BID) | ORAL | Status: DC
  Administered 2019-05-18 – 2019-05-19 (×3): 15 mg via ORAL
  Filled 2019-05-17 (×3): qty 3

## 2019-05-17 MED ORDER — CEFAZOLIN SODIUM-DEXTROSE 2-4 GM/100ML-% IV SOLN
2.0000 g | Freq: Three times a day (TID) | INTRAVENOUS | Status: AC
  Administered 2019-05-17 – 2019-05-18 (×2): 2 g via INTRAVENOUS
  Filled 2019-05-17 (×2): qty 100

## 2019-05-17 MED ORDER — FENTANYL CITRATE (PF) 250 MCG/5ML IJ SOLN
INTRAMUSCULAR | Status: AC
  Filled 2019-05-17: qty 5

## 2019-05-17 MED ORDER — CEFAZOLIN SODIUM-DEXTROSE 2-3 GM-%(50ML) IV SOLR
INTRAVENOUS | Status: DC | PRN
  Administered 2019-05-17: 2 g via INTRAVENOUS

## 2019-05-17 MED ORDER — FENTANYL CITRATE (PF) 100 MCG/2ML IJ SOLN
INTRAMUSCULAR | Status: DC | PRN
  Administered 2019-05-17: 50 ug via INTRAVENOUS

## 2019-05-17 MED ORDER — KETOROLAC TROMETHAMINE 30 MG/ML IJ SOLN
INTRAMUSCULAR | Status: AC
  Filled 2019-05-17: qty 1

## 2019-05-17 MED ORDER — MENTHOL 3 MG MT LOZG: 1.0000 | LOZENGE | OROMUCOSAL | Status: DC | PRN

## 2019-05-17 MED ORDER — MIDAZOLAM HCL 2 MG/2ML IJ SOLN
INTRAMUSCULAR | Status: AC
  Filled 2019-05-17: qty 2

## 2019-05-17 MED ORDER — SODIUM CHLORIDE 0.9 % IV SOLN
INTRAVENOUS | Status: DC | PRN
  Administered 2019-05-17: 30 ug/min via INTRAVENOUS

## 2019-05-17 MED ORDER — ROCURONIUM BROMIDE 50 MG/5ML IV SOSY
PREFILLED_SYRINGE | INTRAVENOUS | Status: DC | PRN
  Administered 2019-05-17 (×2): 50 mg via INTRAVENOUS

## 2019-05-17 MED ORDER — EPHEDRINE SULFATE-NACL 50-0.9 MG/10ML-% IV SOSY
PREFILLED_SYRINGE | INTRAVENOUS | Status: DC | PRN
  Administered 2019-05-17: 10 mg via INTRAVENOUS

## 2019-05-17 MED ORDER — CHLORHEXIDINE GLUCONATE CLOTH 2 % EX PADS: 6.0000 | MEDICATED_PAD | Freq: Once | CUTANEOUS | Status: DC

## 2019-05-17 MED ORDER — THROMBIN 20000 UNITS EX SOLR
CUTANEOUS | Status: AC
  Filled 2019-05-17: qty 20000

## 2019-05-17 MED ORDER — THROMBIN 5000 UNITS EX SOLR
OROMUCOSAL | Status: DC | PRN
  Administered 2019-05-17: 5 mL via TOPICAL

## 2019-05-17 MED ORDER — CYCLOBENZAPRINE HCL 10 MG PO TABS: 5.0000 mg | ORAL_TABLET | Freq: Three times a day (TID) | ORAL | Status: DC | PRN

## 2019-05-17 MED ORDER — ACETAMINOPHEN 10 MG/ML IV SOLN
INTRAVENOUS | Status: AC
  Filled 2019-05-17: qty 100

## 2019-05-17 MED ORDER — HYDROCODONE-ACETAMINOPHEN 5-325 MG PO TABS: 1.0000 | ORAL_TABLET | ORAL | Status: DC | PRN

## 2019-05-17 MED ORDER — DEXAMETHASONE SODIUM PHOSPHATE 10 MG/ML IJ SOLN
INTRAMUSCULAR | Status: AC
  Filled 2019-05-17: qty 1

## 2019-05-17 MED ORDER — ONDANSETRON HCL 4 MG/2ML IJ SOLN: 4.0000 mg | Freq: Four times a day (QID) | INTRAMUSCULAR | Status: DC | PRN

## 2019-05-17 MED ORDER — BACITRACIN ZINC 500 UNIT/GM EX OINT
TOPICAL_OINTMENT | CUTANEOUS | Status: DC | PRN
  Administered 2019-05-17: 1 via TOPICAL

## 2019-05-17 MED ORDER — ACETAMINOPHEN 10 MG/ML IV SOLN
INTRAVENOUS | Status: DC | PRN
  Administered 2019-05-17: 1000 mg via INTRAVENOUS

## 2019-05-17 MED ORDER — SODIUM CHLORIDE 0.9% FLUSH: 3.0000 mL | INTRAVENOUS | Status: DC | PRN

## 2019-05-17 MED ORDER — CARVEDILOL 3.125 MG PO TABS
3.1250 mg | ORAL_TABLET | Freq: Two times a day (BID) | ORAL | Status: DC
  Administered 2019-05-17 – 2019-05-19 (×4): 3.125 mg via ORAL
  Filled 2019-05-17 (×5): qty 1

## 2019-05-17 MED ORDER — LIDOCAINE-EPINEPHRINE 1 %-1:100000 IJ SOLN
INTRAMUSCULAR | Status: DC | PRN
  Administered 2019-05-17: 5 mL

## 2019-05-17 MED ORDER — EPHEDRINE 5 MG/ML INJ
INTRAVENOUS | Status: AC
  Filled 2019-05-17: qty 10

## 2019-05-17 MED ORDER — FLEET ENEMA 7-19 GM/118ML RE ENEM: 1.0000 | ENEMA | Freq: Once | RECTAL | Status: DC | PRN

## 2019-05-17 MED ORDER — AMANTADINE HCL 100 MG PO CAPS
100.0000 mg | ORAL_CAPSULE | Freq: Three times a day (TID) | ORAL | Status: DC
  Administered 2019-05-17 – 2019-05-19 (×6): 100 mg via ORAL
  Filled 2019-05-17 (×8): qty 1

## 2019-05-17 MED ORDER — LIDOCAINE 2% (20 MG/ML) 5 ML SYRINGE
INTRAMUSCULAR | Status: DC | PRN
  Administered 2019-05-17: 40 mg via INTRAVENOUS

## 2019-05-17 MED ORDER — SUCCINYLCHOLINE CHLORIDE 200 MG/10ML IV SOSY
PREFILLED_SYRINGE | INTRAVENOUS | Status: DC | PRN
  Administered 2019-05-17: 140 mg via INTRAVENOUS

## 2019-05-17 MED ORDER — DONEPEZIL HCL 5 MG PO TABS
5.0000 mg | ORAL_TABLET | Freq: Every day | ORAL | Status: DC
  Administered 2019-05-17 – 2019-05-18 (×2): 5 mg via ORAL
  Filled 2019-05-17 (×2): qty 1

## 2019-05-17 MED ORDER — BISACODYL 10 MG RE SUPP: 10.0000 mg | Freq: Every day | RECTAL | Status: DC | PRN

## 2019-05-17 MED ORDER — PHENOL 1.4 % MT LIQD: 1.0000 | OROMUCOSAL | Status: DC | PRN

## 2019-05-17 MED ORDER — 0.9 % SODIUM CHLORIDE (POUR BTL) OPTIME
TOPICAL | Status: DC | PRN
  Administered 2019-05-17: 1000 mL

## 2019-05-17 MED ORDER — FAMOTIDINE 20 MG PO TABS
10.0000 mg | ORAL_TABLET | Freq: Every day | ORAL | Status: DC | PRN
  Filled 2019-05-17: qty 1

## 2019-05-17 MED ORDER — PHENYLEPHRINE 40 MCG/ML (10ML) SYRINGE FOR IV PUSH (FOR BLOOD PRESSURE SUPPORT)
PREFILLED_SYRINGE | INTRAVENOUS | Status: AC
  Filled 2019-05-17: qty 10

## 2019-05-17 MED ORDER — SCOPOLAMINE 1 MG/3DAYS TD PT72
MEDICATED_PATCH | TRANSDERMAL | Status: DC | PRN
  Administered 2019-05-17: 1 via TRANSDERMAL

## 2019-05-17 MED ORDER — PROPOFOL 500 MG/50ML IV EMUL
INTRAVENOUS | Status: DC | PRN
  Administered 2019-05-17: 100 ug/kg/min via INTRAVENOUS

## 2019-05-17 MED ORDER — DEXAMETHASONE SODIUM PHOSPHATE 10 MG/ML IJ SOLN
INTRAMUSCULAR | Status: DC | PRN
  Administered 2019-05-17: 10 mg via INTRAVENOUS

## 2019-05-17 MED ORDER — MIDAZOLAM HCL 5 MG/5ML IJ SOLN
INTRAMUSCULAR | Status: DC | PRN
  Administered 2019-05-17: 2 mg via INTRAVENOUS

## 2019-05-17 MED ORDER — ONDANSETRON HCL 4 MG PO TABS: 4.0000 mg | ORAL_TABLET | Freq: Four times a day (QID) | ORAL | Status: DC | PRN

## 2019-05-17 MED ORDER — CEFAZOLIN SODIUM-DEXTROSE 2-4 GM/100ML-% IV SOLN
2.0000 g | INTRAVENOUS | Status: AC
  Administered 2019-05-17: 2 g via INTRAVENOUS
  Filled 2019-05-17: qty 100

## 2019-05-17 MED ORDER — SCOPOLAMINE 1 MG/3DAYS TD PT72
MEDICATED_PATCH | TRANSDERMAL | Status: AC
  Filled 2019-05-17: qty 1

## 2019-05-17 MED ORDER — THROMBIN 20000 UNITS EX SOLR
CUTANEOUS | Status: DC | PRN
  Administered 2019-05-17: 08:00:00 20 mL via TOPICAL

## 2019-05-17 MED ORDER — PROPOFOL 10 MG/ML IV BOLUS
INTRAVENOUS | Status: DC | PRN
  Administered 2019-05-17: 20 mg via INTRAVENOUS
  Administered 2019-05-17: 150 mg via INTRAVENOUS

## 2019-05-17 MED ORDER — LIDOCAINE-EPINEPHRINE 1 %-1:100000 IJ SOLN
INTRAMUSCULAR | Status: AC
  Filled 2019-05-17: qty 1

## 2019-05-17 MED ORDER — ACETAMINOPHEN 650 MG RE SUPP: 650.0000 mg | RECTAL | Status: DC | PRN

## 2019-05-17 MED ORDER — HYDROXYZINE HCL 50 MG/ML IM SOLN
50.0000 mg | INTRAMUSCULAR | Status: DC | PRN
  Filled 2019-05-17: qty 1

## 2019-05-17 MED ORDER — BUPIVACAINE HCL (PF) 0.5 % IJ SOLN
INTRAMUSCULAR | Status: DC | PRN
  Administered 2019-05-17: 5 mL

## 2019-05-17 MED ORDER — LIDOCAINE 2% (20 MG/ML) 5 ML SYRINGE
INTRAMUSCULAR | Status: DC | PRN
  Administered 2019-05-17: 100 mg via INTRAVENOUS

## 2019-05-17 MED ORDER — PROPOFOL 10 MG/ML IV BOLUS
INTRAVENOUS | Status: DC | PRN
  Administered 2019-05-17: 120 mg via INTRAVENOUS

## 2019-05-17 MED ORDER — PANTOPRAZOLE SODIUM 40 MG PO TBEC
40.0000 mg | DELAYED_RELEASE_TABLET | Freq: Two times a day (BID) | ORAL | Status: DC
  Administered 2019-05-17 – 2019-05-19 (×4): 40 mg via ORAL
  Filled 2019-05-17 (×4): qty 1

## 2019-05-17 MED ORDER — ALUM & MAG HYDROXIDE-SIMETH 200-200-20 MG/5ML PO SUSP: 30.0000 mL | Freq: Four times a day (QID) | ORAL | Status: DC | PRN

## 2019-05-17 MED ORDER — KETOROLAC TROMETHAMINE 30 MG/ML IJ SOLN
15.0000 mg | Freq: Once | INTRAMUSCULAR | Status: AC
  Administered 2019-05-17: 11:00:00 15 mg via INTRAVENOUS

## 2019-05-17 MED ORDER — PHENYLEPHRINE 40 MCG/ML (10ML) SYRINGE FOR IV PUSH (FOR BLOOD PRESSURE SUPPORT)
PREFILLED_SYRINGE | INTRAVENOUS | Status: DC | PRN
  Administered 2019-05-17 (×4): 80 ug via INTRAVENOUS

## 2019-05-17 MED ORDER — BUPIVACAINE HCL (PF) 0.5 % IJ SOLN
INTRAMUSCULAR | Status: AC
  Filled 2019-05-17: qty 30

## 2019-05-17 MED ORDER — SUGAMMADEX SODIUM 200 MG/2ML IV SOLN
INTRAVENOUS | Status: DC | PRN
  Administered 2019-05-17: 130 mg via INTRAVENOUS

## 2019-05-17 MED ORDER — KETOROLAC TROMETHAMINE 15 MG/ML IJ SOLN
INTRAMUSCULAR | Status: AC
  Filled 2019-05-17: qty 1

## 2019-05-17 MED ORDER — FENTANYL CITRATE (PF) 100 MCG/2ML IJ SOLN
INTRAMUSCULAR | Status: DC | PRN
  Administered 2019-05-17 (×5): 50 ug via INTRAVENOUS

## 2019-05-17 MED ORDER — MAGNESIUM HYDROXIDE 400 MG/5ML PO SUSP: 30.0000 mL | Freq: Every day | ORAL | Status: DC | PRN

## 2019-05-17 MED ORDER — KCL IN DEXTROSE-NACL 20-5-0.45 MEQ/L-%-% IV SOLN
INTRAVENOUS | Status: DC
  Administered 2019-05-17: 18:00:00 via INTRAVENOUS
  Filled 2019-05-17: qty 1000

## 2019-05-17 MED ORDER — KETOROLAC TROMETHAMINE 15 MG/ML IJ SOLN
INTRAMUSCULAR | Status: AC
  Administered 2019-05-17: 11:00:00 15 mg
  Filled 2019-05-17: qty 1

## 2019-05-17 MED ORDER — BACITRACIN ZINC 500 UNIT/GM EX OINT
TOPICAL_OINTMENT | CUTANEOUS | Status: AC
  Filled 2019-05-17: qty 28.35

## 2019-05-17 MED ORDER — ROPINIROLE HCL 1 MG PO TABS
0.5000 mg | ORAL_TABLET | Freq: Every day | ORAL | Status: DC
  Administered 2019-05-17 – 2019-05-18 (×2): 0.5 mg via ORAL
  Filled 2019-05-17 (×3): qty 1

## 2019-05-17 MED ORDER — CARVEDILOL 3.125 MG PO TABS
3.1250 mg | ORAL_TABLET | Freq: Once | ORAL | Status: AC
  Administered 2019-05-17: 06:00:00 3.125 mg via ORAL
  Filled 2019-05-17: qty 1

## 2019-05-17 MED ORDER — LACTATED RINGERS IV SOLN
INTRAVENOUS | Status: DC | PRN
  Administered 2019-05-17 (×2): via INTRAVENOUS

## 2019-05-17 MED ORDER — LAMOTRIGINE 150 MG PO TABS
150.0000 mg | ORAL_TABLET | Freq: Two times a day (BID) | ORAL | Status: DC
  Administered 2019-05-17 – 2019-05-18 (×3): 150 mg via ORAL
  Filled 2019-05-17 (×5): qty 1

## 2019-05-17 SURGICAL SUPPLY — 75 items
APPLICATOR COTTON TIP 6IN STRL (MISCELLANEOUS) IMPLANT
BAG DECANTER FOR FLEXI CONT (MISCELLANEOUS) ×3 IMPLANT
BANDAGE GAUZE 4  KLING STR (GAUZE/BANDAGES/DRESSINGS) IMPLANT
BIT DRILL WIRE PASS 1.3MM (BIT) IMPLANT
BNDG GAUZE ELAST 4 BULKY (GAUZE/BANDAGES/DRESSINGS) IMPLANT
BUR ACORN 6.0 PRECISION (BURR) IMPLANT
BUR SPIRAL ROUTER 2.3 (BUR) IMPLANT
CANISTER SUCT 3000ML PPV (MISCELLANEOUS) ×3 IMPLANT
CARTRIDGE OIL MAESTRO DRILL (MISCELLANEOUS) ×2 IMPLANT
CLIP VESOCCLUDE MED 6/CT (CLIP) IMPLANT
COVER WAND RF STERILE (DRAPES) ×3 IMPLANT
DERMABOND ADVANCED (GAUZE/BANDAGES/DRESSINGS) ×1
DERMABOND ADVANCED .7 DNX12 (GAUZE/BANDAGES/DRESSINGS) ×2 IMPLANT
DIFFUSER DRILL AIR PNEUMATIC (MISCELLANEOUS) ×3 IMPLANT
DRAIN PENROSE 1/2X12 LTX STRL (WOUND CARE) IMPLANT
DRAIN SNY WOU 7FLT (WOUND CARE) ×3 IMPLANT
DRAPE LAPAROTOMY 100X72 PEDS (DRAPES) ×3 IMPLANT
DRAPE MICROSCOPE LEICA (MISCELLANEOUS) ×3 IMPLANT
DRAPE NEUROLOGICAL W/INCISE (DRAPES) IMPLANT
DRAPE SURG 17X23 STRL (DRAPES) IMPLANT
DRAPE WARM FLUID 44X44 (DRAPE) IMPLANT
DRILL WIRE PASS 1.3MM (BIT)
DRSG ADAPTIC 3X8 NADH LF (GAUZE/BANDAGES/DRESSINGS) IMPLANT
DRSG OPSITE POSTOP 4X6 (GAUZE/BANDAGES/DRESSINGS) ×3 IMPLANT
DRSG PAD ABDOMINAL 8X10 ST (GAUZE/BANDAGES/DRESSINGS) IMPLANT
ELECT CAUTERY BLADE 6.4 (BLADE) ×3 IMPLANT
ELECT REM PT RETURN 9FT ADLT (ELECTROSURGICAL) ×3
ELECTRODE REM PT RTRN 9FT ADLT (ELECTROSURGICAL) ×2 IMPLANT
EVACUATOR SILICONE 100CC (DRAIN) ×3 IMPLANT
GAUZE 4X4 16PLY RFD (DISPOSABLE) IMPLANT
GAUZE SPONGE 4X4 12PLY STRL (GAUZE/BANDAGES/DRESSINGS) IMPLANT
GLOVE BIOGEL PI IND STRL 7.0 (GLOVE) ×2 IMPLANT
GLOVE BIOGEL PI IND STRL 7.5 (GLOVE) ×2 IMPLANT
GLOVE BIOGEL PI IND STRL 8 (GLOVE) ×4 IMPLANT
GLOVE BIOGEL PI INDICATOR 7.0 (GLOVE) ×1
GLOVE BIOGEL PI INDICATOR 7.5 (GLOVE) ×1
GLOVE BIOGEL PI INDICATOR 8 (GLOVE) ×2
GLOVE ECLIPSE 7.5 STRL STRAW (GLOVE) IMPLANT
GLOVE EXAM NITRILE XL STR (GLOVE) IMPLANT
GLOVE SURG SS PI 7.5 STRL IVOR (GLOVE) ×9 IMPLANT
GLOVE SURG SS PI 8.0 STRL IVOR (GLOVE) ×3 IMPLANT
GOWN STRL REUS W/ TWL LRG LVL3 (GOWN DISPOSABLE) ×2 IMPLANT
GOWN STRL REUS W/ TWL XL LVL3 (GOWN DISPOSABLE) ×2 IMPLANT
GOWN STRL REUS W/TWL 2XL LVL3 (GOWN DISPOSABLE) IMPLANT
GOWN STRL REUS W/TWL LRG LVL3 (GOWN DISPOSABLE) ×1
GOWN STRL REUS W/TWL XL LVL3 (GOWN DISPOSABLE) ×1
HEMOSTAT POWDER KIT SURGIFOAM (HEMOSTASIS) ×3 IMPLANT
HEMOSTAT SURGICEL 2X14 (HEMOSTASIS) IMPLANT
KIT BASIN OR (CUSTOM PROCEDURE TRAY) ×3 IMPLANT
KIT TURNOVER KIT B (KITS) ×3 IMPLANT
NEEDLE SPNL 22GX3.5 QUINCKE BK (NEEDLE) IMPLANT
NS IRRIG 1000ML POUR BTL (IV SOLUTION) ×3 IMPLANT
OIL CARTRIDGE MAESTRO DRILL (MISCELLANEOUS) ×3
PACK CRANIOTOMY CUSTOM (CUSTOM PROCEDURE TRAY) ×3 IMPLANT
PAD ARMBOARD 7.5X6 YLW CONV (MISCELLANEOUS) ×9 IMPLANT
PATTIES SURGICAL .5 X.5 (GAUZE/BANDAGES/DRESSINGS) IMPLANT
PATTIES SURGICAL .5 X3 (DISPOSABLE) IMPLANT
PATTIES SURGICAL 1/4 X 3 (GAUZE/BANDAGES/DRESSINGS) IMPLANT
PATTIES SURGICAL 1X1 (DISPOSABLE) IMPLANT
PIN MAYFIELD SKULL DISP (PIN) IMPLANT
SPECIMEN JAR SMALL (MISCELLANEOUS) IMPLANT
SPONGE INTESTINAL PEANUT (DISPOSABLE) ×6 IMPLANT
SPONGE NEURO XRAY DETECT 1X3 (DISPOSABLE) IMPLANT
SPONGE SURGIFOAM ABS GEL 100 (HEMOSTASIS) ×3 IMPLANT
STAPLER SKIN PROX WIDE 3.9 (STAPLE) ×3 IMPLANT
SUT ETHILON 3 0 FSL (SUTURE) ×6 IMPLANT
SUT NURALON 4 0 TR CR/8 (SUTURE) IMPLANT
SUT VIC AB 2-0 CP2 18 (SUTURE) ×6 IMPLANT
SYR CONTROL 10ML LL (SYRINGE) IMPLANT
TOWEL GREEN STERILE (TOWEL DISPOSABLE) ×3 IMPLANT
TOWEL GREEN STERILE FF (TOWEL DISPOSABLE) ×3 IMPLANT
TRAP SPECIMEN MUCOUS 40CC (MISCELLANEOUS) IMPLANT
TRAY FOLEY MTR SLVR 16FR STAT (SET/KITS/TRAYS/PACK) IMPLANT
UNDERPAD 30X30 (UNDERPADS AND DIAPERS) IMPLANT
WATER STERILE IRR 1000ML POUR (IV SOLUTION) ×3 IMPLANT

## 2019-05-17 SURGICAL SUPPLY — 64 items
ALLOGRAFT 7X14X11 (Bone Implant) ×2 IMPLANT
ALLOGRAFT CA 6X14X11 (Bone Implant) ×2 IMPLANT
BAG DECANTER FOR FLEXI CONT (MISCELLANEOUS) ×2 IMPLANT
BIT DRILL 12X2.5XAVTR (BIT) ×1 IMPLANT
BIT DRILL AVIATOR 12 (BIT) ×1
BIT DRILL NEURO 2X3.1 SFT TUCH (MISCELLANEOUS) ×1 IMPLANT
BIT DRL 12X2.5XAVTR (BIT) ×1
BLADE ULTRA TIP 2M (BLADE) IMPLANT
CANISTER SUCT 3000ML PPV (MISCELLANEOUS) ×2 IMPLANT
CARTRIDGE OIL MAESTRO DRILL (MISCELLANEOUS) ×1 IMPLANT
CATH FOLEY LATEX FREE 16FR (CATHETERS) ×1
CATH FOLEY LF 16FR (CATHETERS) ×1 IMPLANT
COLLAR CERV LO CONTOUR FIRM DE (SOFTGOODS) ×2 IMPLANT
COVER MAYO STAND STRL (DRAPES) ×2 IMPLANT
COVER WAND RF STERILE (DRAPES) ×2 IMPLANT
DECANTER SPIKE VIAL GLASS SM (MISCELLANEOUS) ×2 IMPLANT
DERMABOND ADVANCED (GAUZE/BANDAGES/DRESSINGS) ×1
DERMABOND ADVANCED .7 DNX12 (GAUZE/BANDAGES/DRESSINGS) ×1 IMPLANT
DIFFUSER DRILL AIR PNEUMATIC (MISCELLANEOUS) ×2 IMPLANT
DRAPE HALF SHEET 40X57 (DRAPES) ×2 IMPLANT
DRAPE LAPAROTOMY 100X72 PEDS (DRAPES) ×2 IMPLANT
DRAPE MICROSCOPE LEICA (MISCELLANEOUS) ×2 IMPLANT
DRAPE POUCH INSTRU U-SHP 10X18 (DRAPES) ×2 IMPLANT
DRILL NEURO 2X3.1 SOFT TOUCH (MISCELLANEOUS) ×2
ELECT COATED BLADE 2.86 ST (ELECTRODE) ×2 IMPLANT
ELECT REM PT RETURN 9FT ADLT (ELECTROSURGICAL) ×2
ELECTRODE REM PT RTRN 9FT ADLT (ELECTROSURGICAL) ×1 IMPLANT
GLOVE BIOGEL PI IND STRL 7.0 (GLOVE) ×2 IMPLANT
GLOVE BIOGEL PI IND STRL 7.5 (GLOVE) ×2 IMPLANT
GLOVE BIOGEL PI IND STRL 8 (GLOVE) ×1 IMPLANT
GLOVE BIOGEL PI INDICATOR 7.0 (GLOVE) ×2
GLOVE BIOGEL PI INDICATOR 7.5 (GLOVE) ×2
GLOVE BIOGEL PI INDICATOR 8 (GLOVE) ×1
GLOVE ECLIPSE 7.5 STRL STRAW (GLOVE) IMPLANT
GLOVE EXAM NITRILE XL STR (GLOVE) IMPLANT
GLOVE SURG SS PI 7.0 STRL IVOR (GLOVE) ×6 IMPLANT
GLOVE SURG SS PI 7.5 STRL IVOR (GLOVE) ×2 IMPLANT
GLOVE SURG SS PI 8.0 STRL IVOR (GLOVE) ×2 IMPLANT
GOWN STRL REUS W/ TWL LRG LVL3 (GOWN DISPOSABLE) ×2 IMPLANT
GOWN STRL REUS W/ TWL XL LVL3 (GOWN DISPOSABLE) ×2 IMPLANT
GOWN STRL REUS W/TWL 2XL LVL3 (GOWN DISPOSABLE) IMPLANT
GOWN STRL REUS W/TWL LRG LVL3 (GOWN DISPOSABLE) ×2
GOWN STRL REUS W/TWL XL LVL3 (GOWN DISPOSABLE) ×2
HALTER HD/CHIN CERV TRACTION D (MISCELLANEOUS) ×2 IMPLANT
HEMOSTAT POWDER KIT SURGIFOAM (HEMOSTASIS) ×2 IMPLANT
KIT BASIN OR (CUSTOM PROCEDURE TRAY) ×2 IMPLANT
KIT TURNOVER KIT B (KITS) ×2 IMPLANT
NEEDLE HYPO 25X1 1.5 SAFETY (NEEDLE) ×2 IMPLANT
NEEDLE SPNL 22GX3.5 QUINCKE BK (NEEDLE) ×2 IMPLANT
NS IRRIG 1000ML POUR BTL (IV SOLUTION) ×2 IMPLANT
OIL CARTRIDGE MAESTRO DRILL (MISCELLANEOUS) ×2
PACK LAMINECTOMY NEURO (CUSTOM PROCEDURE TRAY) ×2 IMPLANT
PAD ARMBOARD 7.5X6 YLW CONV (MISCELLANEOUS) ×6 IMPLANT
PLATE AVIATOR ASSY 2LVL SZ 32 (Plate) ×2 IMPLANT
RUBBERBAND STERILE (MISCELLANEOUS) ×4 IMPLANT
SCREW AVIATOR VAR SELFTAP 4X12 (Screw) ×12 IMPLANT
SPONGE INTESTINAL PEANUT (DISPOSABLE) ×2 IMPLANT
SPONGE SURGIFOAM ABS GEL 100 (HEMOSTASIS) ×2 IMPLANT
STAPLER SKIN PROX WIDE 3.9 (STAPLE) IMPLANT
SUT VIC AB 2-0 CP2 18 (SUTURE) ×2 IMPLANT
SUT VIC AB 3-0 SH 8-18 (SUTURE) ×4 IMPLANT
TOWEL GREEN STERILE (TOWEL DISPOSABLE) ×2 IMPLANT
TOWEL GREEN STERILE FF (TOWEL DISPOSABLE) ×2 IMPLANT
WATER STERILE IRR 1000ML POUR (IV SOLUTION) ×2 IMPLANT

## 2019-05-17 NOTE — Progress Notes (Signed)
Vitals:   05/17/19 1531 05/17/19 1535 05/17/19 1536 05/17/19 1550  BP: 121/74   140/70  Pulse:   76 77  Resp:  20  18  Temp:    (!) 97.5 F (36.4 C)  TempSrc:      SpO2:   98% 98%  Weight:      Height:        Patient is return to 4 NP.  She is awake and alert, oriented, following commands with all 4 extremities.  Her dressing is clean and dry, and there is been minimal drainage into her Jackson-Pratt drain.  Her neck is soft, without recurrence of hematoma.  She is moving all 4 extremities well.  Plan: Stable during the initial postoperative period following exploration of anterior cervical wound and evacuation of hematoma.  Have spoke with the patient's husband by phone and explained to him the intraoperative findings and her condition postoperatively (I had also called him immediately prior to surgery to explain the wound hematoma that developed and the need for emergent surgery).  Hosie Spangle, MD 05/17/2019, 4:05 PM

## 2019-05-17 NOTE — H&P (Signed)
Subjective: Patient is a 68 y.o. right-handed white female who is admitted for treatment of cervical radiculopathy secondary to C5-6 and C6-7 cervical disc herniation, spondylosis, and degenerative disc disease with nerve root compression, and associated instability with anterolisthesis at C5-6.  Symptomatically she complains of pain around the left scapula and down through the left arm as well as posterior neck pain.  It is progressively worsened over the past 4 months, and at this point is disabling.  She has difficulty sleeping.  She is developed numbness and tingling in her neck extending up to the left arm into the first and second digit of the left hand and has had a sense of weakness in the left upper extremity.  Her history is notable for having had multiple sclerosis and transverse myelitis, initially diagnosed in 1989.  Exam showed significant weakness in the left upper extremity.  Patient status post a previous C3-4 ACDF in 2004 and a C4-5 ACDF in 2006.  She is admitted now for a C5-6 and C6-7 anterior cervical decompression arthrodesis with structural allograft and cervical plating.  We plan on removing the existing anterior cervical plate at Z6-1.  Patient Active Problem List   Diagnosis Date Noted  . Memory loss 03/14/2019  . Excessive daytime sleepiness 10/17/2017  . Vitamin D deficiency 12/09/2016  . Attention deficit disorder 12/09/2016  . Sjogren's disease ([place]) 05/03/2016  . Other headache syndrome 09/08/2015  . Transverse myelitis ([place]) 05/06/2015  . Dysesthesia 05/06/2015  . Neck pain 05/06/2015  . Occipital neuralgia 05/06/2015  . Spastic gait 02/05/2015  . Urinary frequency 02/05/2015  . Other fatigue 02/05/2015  . Multiple lung nodules on CT 09/24/2013  . Nipple discharge 08/04/2012  . 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 VOCAL CORD DISORDER 10/21/2009  . DYSPHAGIA, OROPHARYNGEAL PHASE 10/21/2009  . PULMONARY NODULE, RIGHT UPPER LOBE 01/22/2009  . DYSPNEA 09/24/2008  .  BRONCHITIS, OBSTRUCTIVE CHRONIC, WITH EXACERBATION 09/10/2008  . G E R D 09/10/2008  . MULTIPLE SCLEROSIS 03/13/2008  . MIGRAINE, CHRONIC 03/13/2008  . CHRONIC RHINITIS 03/13/2008  . COUGH 03/13/2008   Past Medical History:  Diagnosis Date  . Arthritis    back  . Asthma    20 yrs., ago one episode  . Dysesthesia    bilateral feet  . Dyspnea   . Family history of adverse reaction to anesthesia    mother -- ponv  . Fatigue   . Fecal incontinence   . Frequent falls    due to MS  . GERD (gastroesophageal reflux disease)   . Heart murmur    first noticed 41 yrs. when pregnant, can't hear it now  . History of colon polyps    2013;  2015  . History of hiatal hernia   . 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 Migraines    right side   . Mitral valve prolapse    states no problem  . Multiple sclerosis [facility]) 1989   neurologist-  dr Felecia Shelling  . Myocarditis ([place]) 04/25/2019  . Neurogenic bladder   . Osteoporosis   . Pneumonia    h/o of pneu.  Marland Kitchen PONV (postoperative nausea and vomiting)   . Pulmonary nodule, right    pulmologist-  dr Milinda Hirschfeld--  stable per ct 05-17-2016  . Short of breath on exertion   . Sjogren's disease ([place])    dryness of eyes, mouth  . Spastic gait   .  Transverse myelitis ([place])    T4  . Wears bilateral ankle braces    AFO braces for stability    Past Surgical History:  Procedure Laterality Date  . ANAL RECTAL MANOMETRY N/A 08/18/2016   Procedure: ANO RECTAL MANOMETRY;  Surgeon: Leighton Ruff, MD;  Location: [facility];  Service: Endoscopy;  Laterality: N/A;  . ANTERIOR CERVICAL DECOMP/DISCECTOMY FUSION  2004;   2003   C4 -- C5 (2004)/   C3 -- C4 (2003)  . APPENDECTOMY  06/22/2011   laparoscopic  . BILATERAL SALPINGOOPHORECTOMY  1982  . BLADDER SUSPENSION  1994   sling  . BREAST ENHANCEMENT SURGERY Bilateral   . BREAST IMPLANT REMOVAL Bilateral 12/03/2008  . BUNIONECTOMY    .  CATARACT EXTRACTION W/ INTRAOCULAR LENS  IMPLANT, BILATERAL  2015  . ELBOW SURGERY Right   . HEMORROIDECTOMY  08/05/2010;  2013  . INSERTION SACRAL NERVE STIMULATOR TEST WIRE  09-28-2016   dr Marcello Moores  . INTERCOSTAL NERVE BLOCK  2015   occipital  . MENISCUS REPAIR Left 09/26/2018  . RECTAL ULTRASOUND N/A 08/18/2016   Procedure: RECTAL ULTRASOUND;  Surgeon: Leighton Ruff, MD;  Location: [facility];  Service: Endoscopy;  Laterality: N/A;  . SHOULDER ARTHROSCOPY Left   . SHOULDER ARTHROSCOPY WITH DISTAL CLAVICLE RESECTION Right 09/21/2007   w/  Acrominoplasty,  Debridement Rotator Cuff,  CA ligament release  . TONSILLECTOMY  age 61  . TRIGGER FINGER RELEASE Left 01/16/2015   Procedure: LEFT THUMB TRIGGER RELEASE ;  Surgeon: Leanora Cover, MD;  Location: [facility];  Service: Orthopedics;  Laterality: Left;  Marland Kitchen VAGINAL HYSTERECTOMY  1981    Medications Prior to Admission  Medication Sig Dispense Refill Last Dose  . acetaminophen (TYLENOL) 500 MG tablet Take 500 mg by mouth every 6 (six) hours as needed for moderate pain.    05/16/2019 at Unknown time  . Amantadine HCl 100 MG tablet Take 1 tablet twice daily for a few days and then increase to 1 tablet three times daily (Patient taking differently: Take 100 mg by mouth 3 (three) times daily. ) 90 tablet 5 05/16/2019 at Unknown time  . amphetamine-dextroamphetamine (ADDERALL) 15 MG tablet Take one po qAm and one po 4 hours later (Patient taking differently: Take 15 mg by mouth 2 (two) times daily. Take one po qAm and one po 4 hours later) 60 tablet 0 Past Month at Unknown time  . calcium carbonate (OS-CAL) 600 MG TABS tablet Take 1,200 mg by mouth.    Past Week at Unknown time  . carvedilol (COREG) 3.125 MG tablet Take 3.125 mg by mouth 2 (two) times daily with a meal.   05/16/2019 at Unknown time  . donepezil (ARICEPT) 5 MG tablet Take 1 tablet (5 mg total) by mouth at bedtime. 30 tablet 5 05/16/2019 at Unknown time  . famotidine (PEPCID)  10 MG tablet Take 10 mg by mouth daily as needed for heartburn or indigestion.   Past Week at Unknown time  . lamoTRIgine (LAMICTAL) 150 MG tablet TAKE 1 TABLET BY MOUTH TWICE A DAY (Patient taking differently: Take 150 mg by mouth 2 (two) times daily. ) 180 tablet 3 05/16/2019 at Unknown time  . losartan (COZAAR) 25 MG tablet Take 12.5 mg by mouth daily.   Past Month at Unknown time  . pantoprazole (PROTONIX) 40 MG tablet Take 1 tablet (40 mg total) by mouth every morning. (Patient taking differently: Take 40 mg by mouth 2 (two) times daily. ) 30 tablet 3 05/17/2019 at  0500  . rOPINIRole (REQUIP) 0.25 MG tablet TAKE 1 TO 2 TABLETS BY MOUTH AT NIGHT (Patient taking differently: Take 0.5 mg by mouth at bedtime. TAKE 1 TO 2 TABLETS BY MOUTH AT NIGHT) 180 tablet 3 05/16/2019 at Unknown time  . Vitamin D, Ergocalciferol, (DRISDOL) 50000 UNITS CAPS capsule Take 1 capsule (50,000 Units total) by mouth every 7 (seven) days. When finished, take an otc daily vitamin d supplement of 5,000iu. (Patient taking differently: Take 50,000 Units by mouth every 7 (seven) days. ) 12 capsule 0 Past Week at Unknown time  . Dalfampridine (4-AMINOPYRIDINE) POWD Take 5 mg by mouth 3 (three) times daily. Pharmacy dispense 270 capsules with 3 refills. Thank you 270 Bottle 3 More than a month at Unknown time  . denosumab (PROLIA) 60 MG/ML SOLN injection Inject 60 mg into the skin every 6 (six) months. Administer in upper arm, thigh, or abdomen   More than a month at Unknown time  . Meclizine HCl 25 MG CHEW Chew 2 tablets by mouth as needed (dizziness).    Unknown at Unknown time  . Multiple Vitamins-Minerals (MULTIVITAMIN PO) Take 1 tablet by mouth daily. Brand name - Mature Multivitamin from [employer]   More than a month at Unknown time   Allergies  Allergen Reactions  . Other Shortness Of Breath    PURPLE LETTUCE  . Betaseron [Interferon Beta-1b] Other (See Comments)    HARD NODULAR AREAS AT INJECTION SITE  . Codeine Nausea  And Vomiting  . Gilenya [Fingolimod Hydrochloride] Other (See Comments)    MACULAR EDEMA  . Morphine And Related Other (See Comments)    IV ROUTE - RED STREAKS OF VEIN  . Sumatriptan Nausea Only    INJECTABLE ONLY - RAPID HEART RATE, FLUSHING  . Tysabri [Natalizumab] Other (See Comments)    DEVELOPED JCV ANTIBODY  . Latex Rash    Social History   Tobacco Use  . Smoking status: Passive Smoke Exposure - Never Smoker  . Smokeless tobacco: Never Used  . Tobacco comment: parents, husband, & multiple family members  Substance Use Topics  . Alcohol use: No    Alcohol/week: 0.0 standard drinks    Family History  Problem Relation Age of Onset  . Liver disease Father   . Cirrhosis Father   . Stroke Mother   . Cancer Mother        oropharyngeal  . Heart attack Mother   . Anesthesia problems Mother        post-op nausea  . Cancer Brother        twin; tonsillar?  . Cancer Sister 1       breast ca, lung ca  . Cancer Other        Nephew; appendiceal carcnoid, melanoma  . Cancer Paternal Grandmother        cervical     Review of Systems Pertinent items noted in HPI and remainder of comprehensive ROS otherwise negative.  Objective: Vital signs in last 24 hours: Temp:  [97.8 F (36.6 C)] 97.8 F (36.6 C) (05/21 0607) Pulse Rate:  [73] 73 (05/21 0607) Resp:  [18] 18 (05/21 0607) BP: (147)/(67) 147/67 (05/21 0607) SpO2:  [100 %] 100 % (05/21 0607) Weight:  [64.9 kg] 64.9 kg (05/21 0607)  EXAM: Patient is a well-developed well-nourished white female in no acute distress.   Lungs are clear to auscultation , the patient has symmetrical respiratory excursion. Heart has a regular rate and rhythm normal S1 and S2 no murmur.  Abdomen is soft nontender nondistended bowel sounds are present. Extremity examination shows no clubbing cyanosis or edema. Her logic examination shows 5/5 strength in the right upper extremity however in the left upper the deltoid is at least 4, biceps is at  least 4+ to 5, triceps is at least 4, intrinsics are least 4+, and grip is at least 4.  Exam is limited due to the severity of the pain.  Sensation is more intact to pinprick in the right hand is compared to the left hand.  Reflexes are absent in the biceps, brachioradialis, and triceps bilaterally.  Left quadriceps is absent right quadriceps is trace.  Gastrocnemius are absent bilaterally.  Toes are downgoing bilaterally.  Gait and stance are mildly unsteady and she does use a single-point cane.  Data Review:CBC    Component Value Date/Time   WBC 6.6 05/10/2019 1013   RBC 4.64 05/10/2019 1013   HGB 14.5 05/10/2019 1013   HGB 13.7 09/08/2015 0917   HCT 44.1 05/10/2019 1013   HCT 42.3 09/08/2015 0917   PLT 289 05/10/2019 1013   PLT 225 09/08/2015 0917   MCV 95.0 05/10/2019 1013   MCV 94 09/08/2015 0917   MCH 31.3 05/10/2019 1013   MCHC 32.9 05/10/2019 1013   RDW 13.1 05/10/2019 1013   RDW 12.6 09/08/2015 0917   LYMPHSABS 1.6 09/08/2015 0917   MONOABS 0.6 05/13/2012 1050   EOSABS 0.1 09/08/2015 0917   BASOSABS 0.1 09/08/2015 0917                          BMET    Component Value Date/Time   NA 141 05/10/2019 1013   NA 142 02/05/2015 1057   K 4.0 05/10/2019 1013   CL 103 05/10/2019 1013   CO2 31 05/10/2019 1013   GLUCOSE 105 (H) 05/10/2019 1013   BUN 15 05/10/2019 1013   BUN 17 02/05/2015 1057   CREATININE 0.95 05/10/2019 1013   CALCIUM 9.7 05/10/2019 1013   GFRNONAA >60 05/10/2019 1013   GFRAA >60 05/10/2019 1013     Assessment/Plan: Patient with disabling left cervical radiculopathy with pain, weakness, numbness, and tingling to the left upper extremity.  At C5-6 there is moderate degenerative disease and spondylosis with bilateral C5-6 facet arthropathy, left worse than right, hypertrophic in nature, encroaching on the neural foramen, left worse than right.  X-rays showed dynamic instability with anterolisthesis at the C5-6 level at C6-7 there is disc herniation central to  the left.  Patient is admitted now for two-level C5-6 and C6-7 ACDF, we plan on removing the existing C4-5 anterior cervical plate.  I've discussed with the patient the nature of his condition, the nature the surgical procedure, the typical length of surgery, hospital stay, and overall recuperation. We discussed limitations postoperatively. I discussed risks of surgery including risks of infection, bleeding, possibly need for transfusion, the risk of nerve root dysfunction with pain, weakness, numbness, or paresthesias, the risk of spinal cord dysfunction with paralysis of all 4 limbs and quadriplegia, and the risk of dural tear and CSF leakage and possible need for further surgery, the risk of esophageal dysfunction causing dysphagia and the risk of laryngeal dysfunction causing hoarseness of the voice, the risk of failure of the arthrodesis and the possible need for further surgery, and the risk of anesthetic complications including myocardial infarction, stroke, pneumonia, and death. We also discussed the need for postoperative immobilization in a cervical collar. Understanding all this the patient does  wish to proceed with surgery and is admitted for such.   Hosie Spangle, MD 05/17/2019 7:12 AM

## 2019-05-17 NOTE — Progress Notes (Signed)
Vitals:   05/17/19 1041 05/17/19 1056 05/17/19 1125 05/17/19 1335  BP: (!) 144/79 (!) 142/82 (!) 142/76 94/77  Pulse: 68 70  99  Resp: (!) 22 18  (!) 23  Temp:  (!) 97 F (36.1 C) 98.2 F (36.8 C)   TempSrc:   Oral   SpO2: 97% 98%  99%  Weight:      Height:        Late entry:  Called by [facility] nursing staff at about 1325 because patient, rather rapidly, developed difficulty breathing and swelling of the anterior neck.  I came back over to the hospital from the office, and examined the patient.  Her neck was markedly swollen with ecchymosis consistent with a postoperative hematoma the anterior cervical surgical site.  The patient was awake and following commands, and I explained to her that we needed to return to the operating room evacuate the hematoma.  The operating room was contacted and emergent surgery for wound exploration was posted.  The 4 NP nursing staff along with the rapid response nurse transported the patient emergently to the operating room.  The patient was intubated by the anesthesia service with a glide scope.  The patient was then prepared for surgery.  The operative note is dictated separately  After surgery I was able to speak with the patient's PACU nurse and the 4 NP staff.  They explained that her wound and neck were unremarkable in the PACU and upon arrival to 4 NP.  Subsequent to arrival to 4 NP the patient was assisted to the bathroom and back to her bed, she drank some liquids, and then ordered and ate lunch.  She also spoke with her husband by phone.  The dyspnea and swelling of her anterior neck developed subsequently.   Hosie Spangle, MD 05/17/2019, 3:00 PM

## 2019-05-17 NOTE — Transfer of Care (Signed)
Immediate Anesthesia Transfer of Care Note  Patient: [name]  Procedure(s) Performed: EVACUATION HEMATOMA OF POST OP ANTERIOR CERVICAL DECOMPRESSION FUSION (N/A Spine Cervical)  Patient Location: PACU  Anesthesia Type:General  Level of Consciousness: drowsy and patient cooperative  Airway & Oxygen Therapy: Patient Spontanous Breathing and Patient connected to nasal cannula oxygen  Post-op Assessment: Report given to RN, Post -op Vital signs reviewed and stable and Patient moving all extremities  Post vital signs: Reviewed and stable  Last Vitals:  Vitals Value Taken Time  BP 121/68 05/17/2019  3:01 PM  Temp    Pulse 90 05/17/2019  3:01 PM  Resp 17 05/17/2019  3:01 PM  SpO2 99 % 05/17/2019  3:01 PM  Vitals shown include unvalidated device data.  Last Pain:  Vitals:   05/17/19 1125  TempSrc: Oral  PainSc:          Complications: No apparent anesthesia complications

## 2019-05-17 NOTE — Significant Event (Signed)
Rapid Response Event Note RN called for swelling to her neck s/p ACDF Overview:  RN reports pt having difficulty swallowing associated with acute onset of swelling to Left lateral neck s/p ACDF    Initial Focused Assessment: On arrival pt sitting upright in bed, swelling noted to Left lateral neck, pt a/o x3. Pt on 2L [place] prior to arrival. No interventions from RRT other than assisted with transport to OR per Dr. Rita Ohara   Interventions: Transport to [place] (if not transferred):  Event Summary:   at      at          [facility], Sela Hua

## 2019-05-17 NOTE — Progress Notes (Signed)
Vitals:   05/17/19 1535 05/17/19 1536 05/17/19 1550 05/17/19 1610  BP:   140/70 134/78  Pulse:  76 77 78  Resp: 20  18 18   Temp:   (!) 97.5 F (36.4 C) 97.6 F (36.4 C)  TempSrc:    Oral  SpO2:  98% 98% 97%  Weight:      Height:        Patient sitting up in bed, comfortable.  Has been up to the bathroom several times, voiding well.  Ate dinner.  Has not needed any pain medication yet.  Feels that the left cervical radicular symptoms are improved with less being a pain through the left upper extremity.  Dressing dry and intact.  There is been about 30 cc of bloody drainage into the Jackson-Pratt drain.  Her neck is soft and without swelling.  Plan: Encouraged to ambulate in the halls with the staff this evening (ideally twice).  Hosie Spangle, MD 05/17/2019, 7:16 PM

## 2019-05-17 NOTE — Op Note (Signed)
05/17/2019  10:01 AM  PATIENT:  [name]  68 y.o. female  PRE-OPERATIVE DIAGNOSIS: C5-6 and C6-7 cervical disc herniation, cervical spondylosis, cervical degenerative disease, left cervical radiculopathy  POST-OPERATIVE DIAGNOSIS:  C5-6 and C6-7 cervical disc herniation, cervical spondylosis, cervical degenerative disease, left cervical radiculopathy  PROCEDURE:  Procedure(s):  1) removal of C4-5 tethered anterior cervical plate;  2) C5-6 and C6-7 anterior cervical decompression and arthrodesis with structural allograft and aviator cervical plating  SURGEON: Jovita Gamma, MD  ASSISTANTS: Erline Levine, MD  ANESTHESIA:   general  EBL:  Total I/O In: 1000 [I.V.:1000] Out: 400 [Urine:300; Blood:100]  BLOOD ADMINISTERED:none  COUNT:  Correct per nursing staff  DICTATION: Patient was brought to the operating room placed under general endotracheal anesthesia. Patient was placed in 10 pounds of halter traction. The neck was prepped with Betadine soap and solution and draped in a sterile fashion. A horizontal incision was made on the left side of the neck. The line of the incision was infiltrated with local anesthetic with epinephrine. Dissection was carried down thru the subcutaneous tissue and platysma, bipolar cautery was used to maintain hemostasis. Dissection was then carried out thru an avascular plane leaving the sternocleidomastoid carotid artery and jugular vein laterally and the trachea and esophagus medially. The ventral aspect of the vertebral column was identified and identified the existing C4-5 anterior cervical plate.  We thereby identified the C5-6 and C6-7 levels.  The screws were removed from the existing anterior cervical plate, and then the plate itself was removed.  The screw holes were filled with Surgifoam and plugged with Gelfoam with thrombin.  Scar tissue around where the plate had been scarred and was coagulated as needed to establish hemostasis.  We then  proceeded with the C5-6 and C6-7 discectomy.  The annulus at each level was incised and the disc space entered. Discectomy was performed with micro-curettes and pituitary rongeurs. The operating microscope was draped and brought into the field provided additional magnification illumination and visualization. Discectomy was continued posteriorly thru the disc space and then the cartilaginous endplate was removed using micro-curettes along with the high-speed drill. Posterior osteophytic overgrowth was removed each level using the high-speed drill along with a 2 mm thin footplated Kerrison punch. Posterior longitudinal ligament along with disc herniation was carefully removed, decompressing the spinal canal and thecal sac. We then continued to remove osteophytic overgrowth and disc material decompressing the neural foramina and exiting nerve roots bilaterally. Once the decompression was completed hemostasis was established at each level with the use of Gelfoam with thrombin and bipolar cautery. The Gelfoam was removed, a thin layer Surgifoam applied, the wound irrigated and hemostasis confirmed. We then measured the height of the intravertebral disc space level and selected a 6 millimeter in height structural allograft for the C5-6 level and a 7 millimeter in height structural allograft for the C6-7 level . Each was hydrated and saline solution and then gently positioned in the intravertebral disc space and countersunk. We then selected a 32 millimeter in height Aviator cervical plate. It was positioned over the fusion construct and secured to the vertebra with a pair of 4 x 12 mm self-tapping screws at C5, C6, and C7 levels. Each screw hole was started with the high-speed drill and then the screws placed, once all the screws were placed, the locking system was secured. The wound was irrigated with bacitracin solution checked for hemostasis which was established and confirmed. An x-ray was taken which showed grafts in  good  position, plate and screws in good position, and the overall alignment looked good. We then proceeded with closure. The platysma was closed with interrupted inverted 2-0 undyed Vicryl suture, the subcutaneous and subcuticular closed with interrupted inverted 3-0 undyed Vicryl suture. The skin edges were approximated with Dermabond. Following surgery the patient was taken out of cervical traction. To be reversed and the anesthetic and taken to the recovery room for further care.  PLAN OF CARE: Admit for overnight observation  PATIENT DISPOSITION:  PACU - hemodynamically stable.   Delay start of Pharmacological VTE agent (>24hrs) due to surgical blood loss or risk of bleeding:  yes

## 2019-05-17 NOTE — Anesthesia Procedure Notes (Signed)
Procedure Name: Intubation Date/Time: 05/17/2019 7:34 AM Performed by: Lance Coon, CRNA Pre-anesthesia Checklist: Patient identified, Emergency Drugs available, Suction available, Patient being monitored and Timeout performed Patient Re-evaluated:Patient Re-evaluated prior to induction Oxygen Delivery Method: Circle system utilized Preoxygenation: Pre-oxygenation with 100% oxygen Induction Type: IV induction Ventilation: Mask ventilation without difficulty Laryngoscope Size: Glidescope and 4 Grade View: Grade I Tube type: Oral Tube size: 7.0 mm Number of attempts: 1 Airway Equipment and Method: Stylet Placement Confirmation: ETT inserted through vocal cords under direct vision,  positive ETCO2 and breath sounds checked- equal and bilateral Secured at: 21 cm Tube secured with: Tape Dental Injury: Teeth and Oropharynx as per pre-operative assessment

## 2019-05-17 NOTE — Anesthesia Preprocedure Evaluation (Addendum)
Anesthesia Evaluation  Patient identified by MRN, date of birth, ID band Patient awake    Reviewed: Allergy & Precautions, NPO status , Patient's Chart, lab work & pertinent test resultsPreop documentation limited or incomplete due to emergent nature of procedure.  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 Estimated LV ejection fraction 45%, Mid-chamber hypokinesis. Normal right ventricle structure, normal Mitral valve without prolapse, mild AI   Neuro/Psych  Headaches,    GI/Hepatic Neg liver ROS, GERD  ,  Endo/Other  negative endocrine ROS  Renal/GU negative Renal ROS     Musculoskeletal  (+) Arthritis ,   Abdominal   Peds  Hematology negative hematology ROS (+)   Anesthesia Other Findings L neck hematoma s/p ACDF this morning: large, firm, pt dyspneic sitting upright  Reproductive/Obstetrics                             Anesthesia Physical Anesthesia Plan  ASA: III and emergent  Anesthesia Plan: General   Post-op Pain Management:    Induction: Intravenous and Rapid sequence  PONV Risk Score and Plan: 4 or greater and Ondansetron and Treatment may vary due to age or medical condition  Airway Management Planned: Oral ETT and Video Laryngoscope Planned  Additional Equipment:   Intra-op Plan:   Post-operative Plan: Possible Post-op intubation/ventilation  Informed Consent: I have reviewed the patients History and Physical, chart, labs and discussed the procedure including the risks, benefits and alternatives for the proposed  anesthesia with the patient or authorized representative who has indicated his/her understanding and acceptance.     Dental advisory given and Only emergency history available  Plan Discussed with: Surgeon, CRNA and Anesthesiologist  Anesthesia Plan Comments:        Anesthesia Quick Evaluation

## 2019-05-17 NOTE — Transfer of Care (Signed)
Immediate Anesthesia Transfer of Care Note  Patient: [name]  Procedure(s) Performed: REMOVAL OF TETHER CERVICAL PLATE, ANTERIOR CERVICAL DECOMPRESSION/DISCECTOMY FUSION CERVICAL FIVE- CERVICAL SIX, CERVICAL SIX- CERVICAL SEVEN (N/A Spine Cervical)  Patient Location: PACU  Anesthesia Type:General  Level of Consciousness: awake and patient cooperative  Airway & Oxygen Therapy: Patient Spontanous Breathing  Post-op Assessment: Report given to RN and Post -op Vital signs reviewed and stable  Post vital signs: Reviewed and stable  Last Vitals:  Vitals Value Taken Time  BP 146/86 05/17/2019 10:11 AM  Temp 36.1 C 05/17/2019 10:11 AM  Pulse 78 05/17/2019 10:12 AM  Resp 15 05/17/2019 10:12 AM  SpO2 94 % 05/17/2019 10:12 AM  Vitals shown include unvalidated device data.  Last Pain:  Vitals:   05/17/19 0607  TempSrc: Oral  PainSc:          Complications: No apparent anesthesia complications

## 2019-05-17 NOTE — Anesthesia Postprocedure Evaluation (Signed)
Anesthesia Post Note  Patient: [name]  Procedure(s) Performed: EVACUATION HEMATOMA OF POST OP ANTERIOR CERVICAL DECOMPRESSION FUSION (N/A Spine Cervical)     Patient location during evaluation: PACU Anesthesia Type: General Level of consciousness: awake and alert Pain management: pain level controlled Vital Signs Assessment: post-procedure vital signs reviewed and stable Respiratory status: spontaneous breathing, nonlabored ventilation and respiratory function stable Cardiovascular status: blood pressure returned to baseline and stable Postop Assessment: no apparent nausea or vomiting Anesthetic complications: no    Last Vitals:  Vitals:   05/17/19 1536 05/17/19 1550  BP:  140/70  Pulse: 76 77  Resp:  18  Temp:  (!) 36.4 C  SpO2: 98% 98%    Last Pain:  Vitals:   05/17/19 1550  TempSrc:   PainSc: 0-No pain                 Audry Pili

## 2019-05-17 NOTE — Anesthesia Procedure Notes (Signed)
Procedure Name: Intubation Date/Time: 05/17/2019 1:50 PM Performed by: Moshe Salisbury, CRNA Pre-anesthesia Checklist: Patient identified, Emergency Drugs available, Suction available and Patient being monitored Patient Re-evaluated:Patient Re-evaluated prior to induction Oxygen Delivery Method: Circle System Utilized Preoxygenation: Pre-oxygenation with 100% oxygen Induction Type: IV induction and Rapid sequence Laryngoscope Size: Glidescope and 3 Grade View: Grade I Tube type: Oral Tube size: 7.0 mm Number of attempts: 1 Airway Equipment and Method: Stylet and Oral airway Placement Confirmation: ETT inserted through vocal cords under direct vision,  positive ETCO2 and breath sounds checked- equal and bilateral Secured at: 20 cm Tube secured with: Tape Dental Injury: Teeth and Oropharynx as per pre-operative assessment

## 2019-05-17 NOTE — Op Note (Signed)
05/17/2019  3:09 PM  PATIENT:  [name]  68 y.o. female  PRE-OPERATIVE DIAGNOSIS: Postoperative anterior cervical wound hematoma with dyspnea  POST-OPERATIVE DIAGNOSIS: Postoperative anterior cervical wound hematoma with dyspnea  PROCEDURE:  Procedure(s): Exploration of anterior cervical wound and evacuation of wound hematoma  SURGEON:  Surgeon(s): Jovita Gamma, MD  ANESTHESIA:   general  EBL:  Total I/O In: 1700 [I.V.:1650; Other:50] Out: 20 [Urine:375; Blood:150]  BLOOD ADMINISTERED:none  COUNT: Correct per nursing staff  DRAINS: 7 mm flat Jackson-Pratt drain placed in the prevertebral space and brought out through a separate stab incision  DICTATION: Patient was brought to the operating room intubated and placed under general endotracheal anesthesia by the anesthesia service. Patient was placed in 10 pounds of halter traction.  The existing Dermabond was removed.  The neck was prepped with Betadine soap and solution and draped in a sterile fashion.  The incision was reopened, and the hematoma immediately identified.  The platysmal stitches were opened, and the hematoma progressively evacuated with suction aspiration and irrigation with bacitracin solution.  The hematoma extended throughout the surgical plane and prevertebral space.  No point of bleeding was found despite extensive exploration of the wound.  The screw holes for the plate that was removed were again packed with Gelfoam with thrombin.  The walls of the surgical plane and the prevertebral space were extensively explored, but again no points of bleeding could be found.  We then placed a 7 mm Jackson-Pratt drain in the prevertebral space and was brought out through a separate stab incision.  The drain was sutured to the skin with a 3-0 nylon suture.  After extensively irrigating with bacitracin solution, we proceeded with wound closure. The platysma was closed with interrupted inverted 2-0 undyed Vicryl suture,  the subcutaneous and subcuticular closed with interrupted inverted 3-0 undyed Vicryl suture. The skin edges were approximated with Dermabond.  A honeycomb dressing was applied.  The patient was taken out of cervical traction.  Patient was extubated in the operating room and examined.  She opened her eyes to voice and follows commands with all 4 extremities (squeezing her hands and wiggling her toes).  She was then transferred to the recovery room, and reexamined again.  She is awake and alert, following commands, and moving all 4 extremities well.  The anterior neck is soft, and there is been minimal drainage into the Jackson-Pratt drain.  In speaking with the patient in the recovery room, she describes that she had been doing well, but then rapidly developed difficulty breathing.  PLAN OF CARE: Admit for overnight observation  PATIENT DISPOSITION:  PACU - hemodynamically stable.   Delay start of Pharmacological VTE agent (>24hrs) due to surgical blood loss or risk of bleeding:  yes

## 2019-05-17 NOTE — Progress Notes (Signed)
Marcello Moores "Gershon Mussel" (husband) - [phone] to be called post-op and for updates

## 2019-05-18 ENCOUNTER — Encounter (HOSPITAL_COMMUNITY): Payer: Self-pay | Admitting: Neurosurgery

## 2019-05-18 DIAGNOSIS — N319 Neuromuscular dysfunction of bladder, unspecified: Secondary | ICD-10-CM | POA: Diagnosis present

## 2019-05-18 DIAGNOSIS — Y838 Other surgical procedures as the cause of abnormal reaction of the patient, or of later complication, without mention of misadventure at the time of the procedure: Secondary | ICD-10-CM | POA: Diagnosis not present

## 2019-05-18 DIAGNOSIS — Z91018 Allergy to other foods: Secondary | ICD-10-CM | POA: Diagnosis not present

## 2019-05-18 DIAGNOSIS — Z8249 Family history of ischemic heart disease and other diseases of the circulatory system: Secondary | ICD-10-CM | POA: Diagnosis not present

## 2019-05-18 DIAGNOSIS — M3500 Sicca syndrome, unspecified: Secondary | ICD-10-CM | POA: Diagnosis present

## 2019-05-18 DIAGNOSIS — Z91040 Latex allergy status: Secondary | ICD-10-CM | POA: Diagnosis not present

## 2019-05-18 DIAGNOSIS — Z7722 Contact with and (suspected) exposure to environmental tobacco smoke (acute) (chronic): Secondary | ICD-10-CM | POA: Diagnosis present

## 2019-05-18 DIAGNOSIS — Z885 Allergy status to narcotic agent status: Secondary | ICD-10-CM | POA: Diagnosis not present

## 2019-05-18 DIAGNOSIS — Z803 Family history of malignant neoplasm of breast: Secondary | ICD-10-CM | POA: Diagnosis not present

## 2019-05-18 DIAGNOSIS — Z8589 Personal history of malignant neoplasm of other organs and systems: Secondary | ICD-10-CM | POA: Diagnosis not present

## 2019-05-18 DIAGNOSIS — Z801 Family history of malignant neoplasm of trachea, bronchus and lung: Secondary | ICD-10-CM | POA: Diagnosis not present

## 2019-05-18 DIAGNOSIS — M810 Age-related osteoporosis without current pathological fracture: Secondary | ICD-10-CM | POA: Diagnosis present

## 2019-05-18 DIAGNOSIS — K219 Gastro-esophageal reflux disease without esophagitis: Secondary | ICD-10-CM | POA: Diagnosis present

## 2019-05-18 DIAGNOSIS — M4312 Spondylolisthesis, cervical region: Secondary | ICD-10-CM | POA: Diagnosis present

## 2019-05-18 DIAGNOSIS — M4722 Other spondylosis with radiculopathy, cervical region: Secondary | ICD-10-CM | POA: Diagnosis present

## 2019-05-18 DIAGNOSIS — Z888 Allergy status to other drugs, medicaments and biological substances status: Secondary | ICD-10-CM | POA: Diagnosis not present

## 2019-05-18 DIAGNOSIS — G35 Multiple sclerosis: Secondary | ICD-10-CM | POA: Diagnosis present

## 2019-05-18 DIAGNOSIS — M96840 Postprocedural hematoma of a musculoskeletal structure following a musculoskeletal system procedure: Secondary | ICD-10-CM | POA: Diagnosis not present

## 2019-05-18 DIAGNOSIS — Z981 Arthrodesis status: Secondary | ICD-10-CM | POA: Diagnosis not present

## 2019-05-18 DIAGNOSIS — M50122 Cervical disc disorder at C5-C6 level with radiculopathy: Secondary | ICD-10-CM | POA: Diagnosis present

## 2019-05-18 DIAGNOSIS — Z823 Family history of stroke: Secondary | ICD-10-CM | POA: Diagnosis not present

## 2019-05-18 NOTE — Progress Notes (Signed)
Vitals:   05/17/19 1610 05/17/19 2002 05/17/19 2323 05/18/19 0318  BP: 134/78 116/71 123/67 132/77  Pulse: 78 72 80 85  Resp: 18 18 18 18   Temp: 97.6 F (36.4 C) 98.1 F (36.7 C) 98.4 F (36.9 C) 98.7 F (37.1 C)  TempSrc: Oral Oral Oral Oral  SpO2: 97% 96% 99% 98%  Weight:      Height:        Patient resting in bed comfortably.  Minimal residual left cervical radicular discomfort.  Has only been using Tylenol as needed for discomfort.  Has been up and ambulating using her single-point cane and 1 person standby assist.  Voiding well.  Taking clear liquids well.  About 60 cc of bloody drainage into Jackson-Pratt over the past 12 hours.  Cervical dressing clean, dry, and intact.  Neck soft, no swelling.  Plan: Doing well at this time.  We will leave Jackson-Pratt drain in and recheck drainage over next 24 hours.  Encouraged patient to ambulate in the halls at least 6 times over the day and evening.  Will advance to a regular/soft diet.  Hosie Spangle, MD 05/18/2019, 7:03 AM

## 2019-05-18 NOTE — Progress Notes (Signed)
Chaplain rec'd referral from nurse.  Patient religious and would like a visit.  Patient was listening to a sermon on her phone when chaplain came in.  Patient shared her faith story with chaplain, including that her son was a Company secretary and that she had a Clinical research associate with another person who has MS.  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 Chaplain offered ministry of presence and listening ear.  Will continue to follow up as requested. Rev. Tamsen Snider

## 2019-05-18 NOTE — Anesthesia Postprocedure Evaluation (Signed)
Anesthesia Post Note  Patient: [name]  Procedure(s) Performed: REMOVAL OF TETHER CERVICAL PLATE, ANTERIOR CERVICAL DECOMPRESSION/DISCECTOMY FUSION CERVICAL FIVE- CERVICAL SIX, CERVICAL SIX- CERVICAL SEVEN (N/A Spine Cervical)     Patient location during evaluation: PACU Anesthesia Type: General Level of consciousness: awake and alert Pain management: pain level controlled Vital Signs Assessment: post-procedure vital signs reviewed and stable Respiratory status: spontaneous breathing, nonlabored ventilation, respiratory function stable and patient connected to nasal cannula oxygen Cardiovascular status: blood pressure returned to baseline and stable Postop Assessment: no apparent nausea or vomiting Anesthetic complications: no    Last Vitals:  Vitals:   05/17/19 2323 05/18/19 0318  BP: 123/67 132/77  Pulse: 80 85  Resp: 18 18  Temp: 36.9 C 37.1 C  SpO2: 99% 98%    Last Pain:  Vitals:   05/18/19 0657  TempSrc:   PainSc: 3                  [name] S

## 2019-05-19 NOTE — Discharge Summary (Signed)
Physician Discharge Summary  Patient ID: [name] MRN: [id number] DOB/AGE: 04/04/1951 68 y.o.  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  She did well initially following surgery both in the PACU as well as up on the floor.  She was up to the bathroom to void, started on clear liquids and then ate her lunch.  Subsequently and rather rapidly she developed swelling in her neck and difficulty breathing.  She was found to have a large postoperative anterior cervical wound hematoma and was taken emergently to surgery.  Patient was successfully intubated and then the wound explored and the hematoma evacuated.  A Jackson-Pratt drain was placed in the prevertebral space.  There was mild to moderate drainage into Jackson-Pratt drain initially, and it has steadily diminished.  Drainage this morning was minimal and serosanguineous in appearance.  The neck is soft and nonswollen.  The drain was removed and a dressing placed over the drain site; the incision is healing nicely, without erythema, swelling, or drainage.  There is a moderate amount of ecchymosis over the anterior cervical neck as well as over the anterior chest.  The patient has been explained that that will take at least a couple of weeks to clear  up.  Patient has been up and ambulating with her single-point cane.  She is voiding well.  She is eating well.  She is using Tylenol for pain, and has declined narcotic pain medication.  She has been given instructions regarding wound care and activities following discharge.  She is scheduled for follow-up with me in the office in 3 weeks.  Discharge Exam: Blood pressure 133/79, pulse 85, temperature 98.5 F (36.9 C), temperature source Oral, resp. rate 16, height 5\' 10"  (1.778 m), weight 64.9 kg, SpO2 100 %.  Disposition: Discharge disposition: 01-Home or Self Care       Discharge Instructions    Discharge wound care:   Complete by:  As directed    Remove drain site dressing on Monday, May 25.  Leave the wound open to air. Shower daily with the wound uncovered once the drain site dressing is removed. Water and soapy water should run over the incision area. Do not wash directly on the incision for 2 weeks. Remove the glue after 2 weeks.   Driving Restrictions   Complete by:  As directed    No driving for 2 weeks. May ride in the car locally now. May begin to drive locally in 2 weeks.   Other Restrictions   Complete by:  As directed    Walk gradually increasing distances out in the fresh air at least twice a day. Walking additional 6 times inside the house, gradually increasing distances, daily. No bending, lifting, or twisting. Perform activities between shoulder and waist height (that is at counter height when standing or table height when sitting).     Allergies as of 05/19/2019      Reactions   Other Shortness Of Breath  PURPLE LETTUCE   Betaseron [interferon Beta-1b] Other (See Comments)   HARD NODULAR AREAS AT INJECTION SITE   Codeine Nausea And Vomiting   Gilenya [fingolimod Hydrochloride] Other (See Comments)   MACULAR EDEMA   Morphine And Related Other (See Comments)   IV ROUTE - RED STREAKS OF VEIN   Sumatriptan Nausea Only   INJECTABLE ONLY - RAPID HEART RATE, FLUSHING    Tysabri [natalizumab] Other (See Comments)   DEVELOPED JCV ANTIBODY   Latex Rash      Medication List    TAKE these medications   4-Aminopyridine Powd Take 5 mg by mouth 3 (three) times daily. Pharmacy dispense 270 capsules with 3 refills. Thank you   acetaminophen 500 MG tablet Commonly known as:  TYLENOL Take 500 mg by mouth every 6 (six) hours as needed for moderate pain.   Amantadine HCl 100 MG tablet Take 1 tablet twice daily for a few days and then increase to 1 tablet three times daily What changed:    how much to take  how to take this  when to take this  additional instructions   amphetamine-dextroamphetamine 15 MG tablet Commonly known as:  Adderall Take one po qAm and one po 4 hours later What changed:    how much to take  how to take this  when to take this   calcium carbonate 600 MG Tabs tablet Commonly known as:  OS-CAL Take 1,200 mg by mouth.   carvedilol 3.125 MG tablet Commonly known as:  COREG Take 3.125 mg by mouth 2 (two) times daily with a meal.   denosumab 60 MG/ML Soln injection Commonly known as:  PROLIA Inject 60 mg into the skin every 6 (six) months. Administer in upper arm, thigh, or abdomen   donepezil 5 MG tablet Commonly known as:  ARICEPT Take 1 tablet (5 mg total) by mouth at bedtime.   famotidine 10 MG tablet Commonly known as:  PEPCID Take 10 mg by mouth daily as needed for heartburn or indigestion.   lamoTRIgine 150 MG tablet Commonly known as:  LAMICTAL TAKE 1 TABLET BY MOUTH TWICE A DAY   losartan 25 MG tablet Commonly known as:  COZAAR Take 12.5 mg by mouth daily.   Meclizine HCl 25 MG Chew Chew 2 tablets by mouth as needed (dizziness).   MULTIVITAMIN PO Take 1 tablet by mouth daily. Brand name - Mature Multivitamin from [employer]   pantoprazole 40 MG tablet Commonly known as:  PROTONIX Take 1 tablet (40 mg total) by mouth every morning. What changed:  when to take this   rOPINIRole 0.25 MG  tablet Commonly known as:  REQUIP TAKE 1 TO 2 TABLETS BY MOUTH AT NIGHT What changed:    how much to take  how to take this  when to take this   Vitamin D (Ergocalciferol) 1.25 MG (50000 UT) Caps capsule Commonly known as:  DRISDOL Take 1 capsule (50,000 Units total) by mouth every 7 (seven) days. When finished, take an otc daily vitamin d supplement of 5,000iu. What changed:  additional instructions            Discharge Care Instructions  (From admission, onward)         Start     Ordered   05/19/19 0000  Discharge wound care:    Comments:  Remove drain site dressing on Monday, May 25.  Leave the wound open to air. Shower daily with the wound uncovered once the drain site dressing is removed. Water  and soapy water should run over the incision area. Do not wash directly on the incision for 2 weeks. Remove the glue after 2 weeks.   05/19/19 0851           Signed: Hosie Spangle 05/19/2019, 8:51 AM

## 2019-06-04 ENCOUNTER — Other Ambulatory Visit: Payer: Self-pay | Admitting: Neurology

## 2019-06-04 MED ORDER — AMPHETAMINE-DEXTROAMPHETAMINE 15 MG PO TABS: ORAL_TABLET | ORAL | 0 refills | Status: DC

## 2019-06-04 NOTE — Telephone Encounter (Signed)
Pt is needing a refill on her amphetamine-dextroamphetamine (ADDERALL) 15 MG tablet sent to [facility] on [address]

## 2019-06-06 ENCOUNTER — Telehealth: Payer: Self-pay | Admitting: Neurology

## 2019-06-06 NOTE — Telephone Encounter (Signed)
Pt is calling in for a refill of amphetamine-dextroamphetamine (ADDERALL) 15 MG tablet to be sent to [id number] - [place], [place]

## 2019-06-06 NOTE — Telephone Encounter (Signed)
Called LVM for pt to let her know we already sent in refill on 06/04/19 for her. She should contact her pharmacy for the refill.

## 2019-06-08 DIAGNOSIS — M5020 Other cervical disc displacement, unspecified cervical region: Secondary | ICD-10-CM | POA: Diagnosis not present

## 2019-07-03 ENCOUNTER — Other Ambulatory Visit: Payer: Self-pay

## 2019-07-03 ENCOUNTER — Encounter: Payer: Self-pay | Admitting: Orthopedic Surgery

## 2019-07-03 ENCOUNTER — Ambulatory Visit (INDEPENDENT_AMBULATORY_CARE_PROVIDER_SITE_OTHER): Payer: PPO

## 2019-07-03 ENCOUNTER — Ambulatory Visit (INDEPENDENT_AMBULATORY_CARE_PROVIDER_SITE_OTHER): Payer: PPO | Admitting: Orthopedic Surgery

## 2019-07-03 VITALS — Ht 70.0 in | Wt 143.0 lb

## 2019-07-03 DIAGNOSIS — M25562 Pain in left knee: Secondary | ICD-10-CM

## 2019-07-03 DIAGNOSIS — G8929 Other chronic pain: Secondary | ICD-10-CM | POA: Diagnosis not present

## 2019-07-03 MED ORDER — METHYLPREDNISOLONE ACETATE 40 MG/ML IJ SUSP
40.0000 mg | INTRAMUSCULAR | Status: AC | PRN
  Administered 2019-07-03: 40 mg via INTRA_ARTICULAR

## 2019-07-03 MED ORDER — LIDOCAINE HCL 1 % IJ SOLN
5.0000 mL | INTRAMUSCULAR | Status: AC | PRN
  Administered 2019-07-03: 5 mL

## 2019-07-03 NOTE — Progress Notes (Signed)
Office Visit Note   Patient: [name]           Date of Birth: [birth year]           MRN: [id number] Visit Date: 07/03/2019              Requested by: Crist Infante, MD [address] [place],  [place] [address] PCP: Crist Infante, MD  Chief Complaint  Patient presents with  . Left Knee - Pain      HPI: Patient is a 68 year old woman who presents in follow-up for left knee pain with chondromalacia of the patellofemoral joint she complains of swelling and pain with increased activity she describes this as global around the patella.  She states her knee is tender to the touch she has some popping and instability.  Assessment & Plan: Visit Diagnoses:  1. Chronic pain of left knee     Plan: The knee was injected she tolerated this well recommended strength training to unload stress from the cartilage.  Follow-Up Instructions: Return if symptoms worsen or fail to improve.   Ortho Exam  Patient is alert, oriented, no adenopathy, well-dressed, normal affect, normal respiratory effort. Examination patient has a normal gait.  She has full range of motion of the left knee there is no effusion she is very tender to palpation around the patellofemoral joint there is crepitation with range of motion medial lateral joint lines are minimally tender to palpation collaterals and cruciates are stable.  Imaging: No results found. No images are attached to the encounter.  Labs: Lab Results  Component Value Date   GRAMSTAIN CYTOSPIN 11/08/2013   GRAMSTAIN No WBC Seen 11/08/2013   GRAMSTAIN No Organisms Seen 11/08/2013   LABORGA NO GROWTH 3 DAYS 11/08/2013     Lab Results  Component Value Date   ALBUMIN 4.3 09/15/2015   ALBUMIN 4.8 02/05/2015   ALBUMIN 4.3 03/20/2013    No results found for: MG Lab Results  Component Value Date   VD25OH 29.1 (L) 03/14/2019   VD25OH 99.8 12/09/2016   VD25OH 14.5 (L) 02/05/2015    No results found for: PREALBUMIN CBC EXTENDED Latest Ref  Rng & Units 05/10/2019 09/28/2016 09/15/2015  WBC 4.0 - 10.5 K/uL 6.6 - 5.3  RBC 3.87 - 5.11 MIL/uL 4.64 - 4.63  HGB 12.0 - 15.0 g/dL 14.5 12.7 14.3  HCT 36.0 - 46.0 % 44.1 - 43.2  PLT 150 - 400 K/uL 289 - 236  NEUTROABS 1.4 - 7.0 x10E3/uL - - -  LYMPHSABS 0.7 - 3.1 x10E3/uL - - -     Body mass index is 20.52 kg/m.  Orders:  Orders Placed This Encounter  Procedures  . XR Knee 1-2 Views Left   No orders of the defined types were placed in this encounter.    Procedures: Large Joint Inj: L knee on 07/03/2019 5:24 PM Indications: pain and diagnostic evaluation Details: 22 G 1.5 in needle, anteromedial approach  Arthrogram: No  Medications: 5 mL lidocaine 1 %; 40 mg methylPREDNISolone acetate 40 MG/ML Outcome: tolerated well, no immediate complications Procedure, treatment alternatives, risks and benefits explained, specific risks discussed. Consent was given by the patient. Immediately prior to procedure a time out was called to verify the correct patient, procedure, equipment, support staff and site/side marked as required. Patient was prepped and draped in the usual sterile fashion.      Clinical Data: No additional findings.  ROS:  All other systems negative, except as noted in the HPI. Review of  Systems  Objective: Vital Signs: Ht 5\' 10"  (1.778 m)   Wt 143 lb (64.9 kg)   BMI 20.52 kg/m   Specialty Comments:  No specialty comments available.  PMFS History: Patient Active Problem List   Diagnosis Date Noted  . HNP (herniated nucleus pulposus), cervical 05/17/2019  . Memory loss 03/14/2019  . Excessive daytime sleepiness 10/17/2017  . Vitamin D deficiency 12/09/2016  . Attention deficit disorder 12/09/2016  . Sjogren's disease ([place]) 05/03/2016  . Other headache syndrome 09/08/2015  . Transverse myelitis ([place]) 05/06/2015  . Dysesthesia 05/06/2015  . Neck pain 05/06/2015  . Occipital neuralgia 05/06/2015  . Spastic gait 02/05/2015  . Urinary frequency  02/05/2015  . Other fatigue 02/05/2015  . Multiple lung nodules on CT 09/24/2013  . Nipple discharge 08/04/2012  . 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 VOCAL CORD DISORDER 10/21/2009  . DYSPHAGIA, OROPHARYNGEAL PHASE 10/21/2009  . PULMONARY NODULE, RIGHT UPPER LOBE 01/22/2009  . DYSPNEA 09/24/2008  . BRONCHITIS, OBSTRUCTIVE CHRONIC, WITH EXACERBATION 09/10/2008  . G E R D 09/10/2008  . MULTIPLE SCLEROSIS 03/13/2008  . MIGRAINE, CHRONIC 03/13/2008  . CHRONIC RHINITIS 03/13/2008  . COUGH 03/13/2008   Past Medical History:  Diagnosis Date  . Arthritis    back  . Asthma    21 yrs., ago one episode  . Dysesthesia    bilateral feet  . Dyspnea   . Family history of adverse reaction to anesthesia    mother -- ponv  . Fatigue   . Fecal incontinence   . Frequent falls    due to MS  . GERD (gastroesophageal reflux disease)   . Heart murmur    first noticed 41 yrs. when pregnant, can't hear it now  . History of colon polyps    2013;  2015  . History of hiatal hernia   . 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 Migraines    right side   . Mitral valve prolapse    states no problem  . Multiple sclerosis [facility]) 1989   neurologist-  dr Felecia Shelling  . Myocarditis ([place]) 04/25/2019  . Neurogenic bladder   . Osteoporosis   . Pneumonia    h/o of pneu.  Marland Kitchen PONV (postoperative nausea and vomiting)   . Pulmonary nodule, right    pulmologist-  dr Milinda Hirschfeld--  stable per ct 05-17-2016  . Short of breath on exertion   . Sjogren's disease ([place])    dryness of eyes, mouth  . Spastic gait   . Transverse myelitis ([place])    T4  . Wears bilateral ankle braces    AFO braces for stability    Family History  Problem Relation Age of Onset  . Liver disease Father   . Cirrhosis Father   . Stroke Mother   . Cancer Mother        oropharyngeal  . Heart attack Mother   . Anesthesia problems Mother        post-op nausea  . Cancer  Brother        twin; tonsillar?  . Cancer Sister 53       breast ca, lung ca  . Cancer Other        Nephew; appendiceal carcnoid, melanoma  . Cancer Paternal Grandmother        cervical    Past Surgical History:  Procedure Laterality Date  . ANAL RECTAL MANOMETRY N/A 08/18/2016  Procedure: ANO RECTAL MANOMETRY;  Surgeon: Leighton Ruff, MD;  Location: [facility];  Service: Endoscopy;  Laterality: N/A;  . ANTERIOR CERVICAL DECOMP/DISCECTOMY FUSION  2004;   2003   C4 -- C5 (2004)/   C3 -- C4 (2003)  . ANTERIOR CERVICAL DECOMP/DISCECTOMY FUSION N/A 05/17/2019   Procedure: EVACUATION HEMATOMA OF POST OP ANTERIOR CERVICAL DECOMPRESSION FUSION;  Surgeon: Jovita Gamma, MD;  Location: [facility];  Service: Neurosurgery;  Laterality: N/A;  . ANTERIOR CERVICAL DECOMP/DISCECTOMY FUSION N/A 05/17/2019   Procedure: REMOVAL OF TETHER CERVICAL PLATE, ANTERIOR CERVICAL DECOMPRESSION/DISCECTOMY FUSION CERVICAL FIVE- CERVICAL SIX, CERVICAL SIX- CERVICAL SEVEN;  Surgeon: Jovita Gamma, MD;  Location: [facility];  Service: Neurosurgery;  Laterality: N/A;  . APPENDECTOMY  06/22/2011   laparoscopic  . BILATERAL SALPINGOOPHORECTOMY  1982  . BLADDER SUSPENSION  1994   sling  . BREAST ENHANCEMENT SURGERY Bilateral   . BREAST IMPLANT REMOVAL Bilateral 12/03/2008  . BUNIONECTOMY    . CATARACT EXTRACTION W/ INTRAOCULAR LENS  IMPLANT, BILATERAL  2015  . ELBOW SURGERY Right   . HEMORROIDECTOMY  08/05/2010;  2013  . INSERTION SACRAL NERVE STIMULATOR TEST WIRE  09-28-2016   dr Marcello Moores  . INTERCOSTAL NERVE BLOCK  2015   occipital  . MENISCUS REPAIR Left 09/26/2018  . RECTAL ULTRASOUND N/A 08/18/2016   Procedure: RECTAL ULTRASOUND;  Surgeon: Leighton Ruff, MD;  Location: [facility];  Service: Endoscopy;  Laterality: N/A;  . SHOULDER ARTHROSCOPY Left   . SHOULDER ARTHROSCOPY WITH DISTAL CLAVICLE RESECTION Right 09/21/2007   w/  Acrominoplasty,  Debridement Rotator Cuff,  CA ligament release  . TONSILLECTOMY  age 28   . TRIGGER FINGER RELEASE Left 01/16/2015   Procedure: LEFT THUMB TRIGGER RELEASE ;  Surgeon: Leanora Cover, MD;  Location: [facility];  Service: Orthopedics;  Laterality: Left;  Marland Kitchen VAGINAL HYSTERECTOMY  1981   Social History   Occupational History  . Occupation: Retired  Tobacco Use  . Smoking status: Passive Smoke Exposure - Never Smoker  . Smokeless tobacco: Never Used  . Tobacco comment: parents, husband, & multiple family members  Substance and Sexual Activity  . Alcohol use: No    Alcohol/week: 0.0 standard drinks  . Drug use: No  . Sexual activity: Not on file

## 2019-07-10 ENCOUNTER — Other Ambulatory Visit: Payer: Self-pay | Admitting: Neurology

## 2019-07-10 MED ORDER — AMPHETAMINE-DEXTROAMPHETAMINE 15 MG PO TABS: ORAL_TABLET | ORAL | 0 refills | Status: DC

## 2019-07-10 NOTE — Telephone Encounter (Signed)
Pt is needing a refill on her amphetamine-dextroamphetamine (ADDERALL) 15 MG tablet [facility] [address] [facility]

## 2019-07-23 ENCOUNTER — Other Ambulatory Visit: Payer: Self-pay | Admitting: Neurology

## 2019-07-23 DIAGNOSIS — I358 Other nonrheumatic aortic valve disorders: Secondary | ICD-10-CM | POA: Diagnosis not present

## 2019-07-23 DIAGNOSIS — M810 Age-related osteoporosis without current pathological fracture: Secondary | ICD-10-CM | POA: Diagnosis not present

## 2019-07-23 DIAGNOSIS — E7849 Other hyperlipidemia: Secondary | ICD-10-CM | POA: Diagnosis not present

## 2019-07-25 DIAGNOSIS — R82998 Other abnormal findings in urine: Secondary | ICD-10-CM | POA: Diagnosis not present

## 2019-07-30 DIAGNOSIS — M3500 Sicca syndrome, unspecified: Secondary | ICD-10-CM | POA: Diagnosis not present

## 2019-07-30 DIAGNOSIS — R05 Cough: Secondary | ICD-10-CM | POA: Diagnosis not present

## 2019-07-30 DIAGNOSIS — Z1331 Encounter for screening for depression: Secondary | ICD-10-CM | POA: Diagnosis not present

## 2019-07-30 DIAGNOSIS — I700 Atherosclerosis of aorta: Secondary | ICD-10-CM | POA: Diagnosis not present

## 2019-07-30 DIAGNOSIS — J302 Other seasonal allergic rhinitis: Secondary | ICD-10-CM | POA: Diagnosis not present

## 2019-07-30 DIAGNOSIS — R2689 Other abnormalities of gait and mobility: Secondary | ICD-10-CM | POA: Diagnosis not present

## 2019-07-30 DIAGNOSIS — Z0000 Encounter for general adult medical examination without abnormal findings: Secondary | ICD-10-CM | POA: Diagnosis not present

## 2019-07-30 DIAGNOSIS — E785 Hyperlipidemia, unspecified: Secondary | ICD-10-CM | POA: Diagnosis not present

## 2019-07-30 DIAGNOSIS — G609 Hereditary and idiopathic neuropathy, unspecified: Secondary | ICD-10-CM | POA: Diagnosis not present

## 2019-07-30 DIAGNOSIS — G373 Acute transverse myelitis in demyelinating disease of central nervous system: Secondary | ICD-10-CM | POA: Diagnosis not present

## 2019-07-30 DIAGNOSIS — G35 Multiple sclerosis: Secondary | ICD-10-CM | POA: Diagnosis not present

## 2019-07-30 DIAGNOSIS — M419 Scoliosis, unspecified: Secondary | ICD-10-CM | POA: Diagnosis not present

## 2019-08-03 ENCOUNTER — Other Ambulatory Visit: Payer: Self-pay | Admitting: Neurology

## 2019-08-09 ENCOUNTER — Other Ambulatory Visit: Payer: Self-pay | Admitting: Neurology

## 2019-08-09 MED ORDER — AMPHETAMINE-DEXTROAMPHETAMINE 15 MG PO TABS: ORAL_TABLET | ORAL | 0 refills | Status: DC

## 2019-08-09 NOTE — Telephone Encounter (Signed)
Pt is requesting a refill of amphetamine-dextroamphetamine (ADDERALL) 15 MG tablet, to [id number] - [place], [place]

## 2019-08-13 DIAGNOSIS — Z981 Arthrodesis status: Secondary | ICD-10-CM | POA: Diagnosis not present

## 2019-08-17 ENCOUNTER — Other Ambulatory Visit (HOSPITAL_COMMUNITY): Payer: Self-pay | Admitting: *Deleted

## 2019-08-20 ENCOUNTER — Encounter (HOSPITAL_COMMUNITY): Payer: PPO

## 2019-08-23 DIAGNOSIS — M960 Pseudarthrosis after fusion or arthrodesis: Secondary | ICD-10-CM | POA: Diagnosis not present

## 2019-08-23 DIAGNOSIS — S129XXA Fracture of neck, unspecified, initial encounter: Secondary | ICD-10-CM | POA: Diagnosis not present

## 2019-08-23 DIAGNOSIS — Z981 Arthrodesis status: Secondary | ICD-10-CM | POA: Diagnosis not present

## 2019-08-27 DIAGNOSIS — M25551 Pain in right hip: Secondary | ICD-10-CM | POA: Diagnosis not present

## 2019-08-27 DIAGNOSIS — M545 Low back pain: Secondary | ICD-10-CM | POA: Diagnosis not present

## 2019-08-27 DIAGNOSIS — M25552 Pain in left hip: Secondary | ICD-10-CM | POA: Diagnosis not present

## 2019-08-28 ENCOUNTER — Telehealth: Payer: Self-pay | Admitting: Neurology

## 2019-08-28 MED ORDER — 4-AMINOPYRIDINE POWD: 5.0000 mg | Freq: Three times a day (TID) | 3 refills | Status: DC

## 2019-08-28 NOTE — Telephone Encounter (Signed)
E-scribed refill to pharmacy. 

## 2019-08-28 NOTE — Telephone Encounter (Signed)
Patient calling for refill of Dalfampridine (4-AMINOPYRIDINE) POWD from [facility] on [address]. She was told by the pharmacy that no refills are authorized. Please call her to advise [phone]

## 2019-08-28 NOTE — Addendum Note (Signed)
Addended by: Rossie Muskrat L on: 08/28/2019 11:55 AM   Modules accepted: Orders

## 2019-09-09 ENCOUNTER — Other Ambulatory Visit: Payer: Self-pay | Admitting: Neurology

## 2019-09-12 ENCOUNTER — Other Ambulatory Visit: Payer: Self-pay | Admitting: Neurology

## 2019-09-12 MED ORDER — AMPHETAMINE-DEXTROAMPHETAMINE 15 MG PO TABS: ORAL_TABLET | ORAL | 0 refills | Status: DC

## 2019-09-12 NOTE — Telephone Encounter (Signed)
Per Dr. Felecia Shelling, ok to switch so she gets [facility] [facility] [address]. Sent request to MD to escribe rx

## 2019-09-12 NOTE — Telephone Encounter (Signed)
Pt has called for a refill on her  amphetamine-dextroamphetamine (ADDERALL) 15 MG tablet  [facility]  [phone]

## 2019-09-12 NOTE — Telephone Encounter (Signed)
Checked drug registry. Pt last refilled rx adderall 15mg  08/09/19 #30. Last seen 03/14/2019 and next f/u 09/17/2019

## 2019-09-12 NOTE — Addendum Note (Signed)
Addended by: Hope Pigeon on: 09/12/2019 04:41 PM   Modules accepted: Orders

## 2019-09-12 NOTE — Addendum Note (Signed)
Addended by: Hope Pigeon on: 09/12/2019 02:52 PM   Modules accepted: Orders

## 2019-09-17 ENCOUNTER — Other Ambulatory Visit: Payer: Self-pay

## 2019-09-17 ENCOUNTER — Telehealth: Payer: Self-pay | Admitting: Neurology

## 2019-09-17 ENCOUNTER — Encounter: Payer: Self-pay | Admitting: Neurology

## 2019-09-17 ENCOUNTER — Ambulatory Visit (INDEPENDENT_AMBULATORY_CARE_PROVIDER_SITE_OTHER): Payer: PPO | Admitting: Neurology

## 2019-09-17 ENCOUNTER — Telehealth: Payer: Self-pay | Admitting: *Deleted

## 2019-09-17 VITALS — BP 111/73 | HR 81 | Ht 70.0 in | Wt 158.0 lb

## 2019-09-17 DIAGNOSIS — R413 Other amnesia: Secondary | ICD-10-CM

## 2019-09-17 DIAGNOSIS — R5383 Other fatigue: Secondary | ICD-10-CM | POA: Diagnosis not present

## 2019-09-17 DIAGNOSIS — R261 Paralytic gait: Secondary | ICD-10-CM

## 2019-09-17 DIAGNOSIS — R350 Frequency of micturition: Secondary | ICD-10-CM

## 2019-09-17 DIAGNOSIS — G373 Acute transverse myelitis in demyelinating disease of central nervous system: Secondary | ICD-10-CM | POA: Diagnosis not present

## 2019-09-17 MED ORDER — DONEPEZIL HCL 10 MG PO TABS: 10.0000 mg | ORAL_TABLET | Freq: Every day | ORAL | 4 refills | Status: DC

## 2019-09-17 NOTE — Telephone Encounter (Signed)
Health team order sent to GI. No auth they will reach out to the patient to schedule.  

## 2019-09-17 NOTE — Telephone Encounter (Signed)
Called pt. She was in store and ask I call back and LVM with appt/date for 6 month f/u: 03/17/20 at 10am with AL,NP. Pt aware appt with NP.
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
## 2019-09-19 DIAGNOSIS — M545 Low back pain: Secondary | ICD-10-CM | POA: Diagnosis not present

## 2019-09-19 DIAGNOSIS — M25551 Pain in right hip: Secondary | ICD-10-CM | POA: Diagnosis not present

## 2019-09-21 ENCOUNTER — Other Ambulatory Visit: Payer: Self-pay

## 2019-09-21 ENCOUNTER — Ambulatory Visit (HOSPITAL_COMMUNITY)
Admission: RE | Admit: 2019-09-21 | Discharge: 2019-09-21 | Disposition: A | Discharge status: Home / Self Care | Payer: PPO | Source: Ambulatory Visit | Attending: Internal Medicine | Admitting: Internal Medicine

## 2019-09-21 DIAGNOSIS — M810 Age-related osteoporosis without current pathological fracture: Secondary | ICD-10-CM | POA: Diagnosis not present

## 2019-09-21 MED ORDER — DENOSUMAB 60 MG/ML ~~LOC~~ SOSY
PREFILLED_SYRINGE | SUBCUTANEOUS | Status: AC
  Filled 2019-09-21: qty 1

## 2019-09-21 MED ORDER — DENOSUMAB 60 MG/ML ~~LOC~~ SOSY
60.0000 mg | PREFILLED_SYRINGE | Freq: Once | SUBCUTANEOUS | Status: AC
  Administered 2019-09-21: 60 mg via SUBCUTANEOUS

## 2019-09-25 ENCOUNTER — Ambulatory Visit
Admission: RE | Admit: 2019-09-25 | Discharge: 2019-09-25 | Disposition: A | Discharge status: Home / Self Care | Payer: PPO | Source: Ambulatory Visit | Attending: Neurology | Admitting: Neurology

## 2019-09-25 DIAGNOSIS — R413 Other amnesia: Secondary | ICD-10-CM | POA: Diagnosis not present

## 2019-09-26 ENCOUNTER — Telehealth: Payer: Self-pay | Admitting: *Deleted

## 2019-09-26 NOTE — Telephone Encounter (Signed)
-----   Message from Britt Bottom, MD sent at 09/25/2019  8:45 PM EDT ----- Please let her know that the CT scan of the head looks normal.  There is no atrophy.

## 2019-09-26 NOTE — Telephone Encounter (Signed)
Called and spoke with pt about results per Dr. Sater note. Pt verbalized understanding. 

## 2019-09-28 DIAGNOSIS — Z23 Encounter for immunization: Secondary | ICD-10-CM | POA: Diagnosis not present

## 2019-10-16 ENCOUNTER — Other Ambulatory Visit: Payer: Self-pay | Admitting: Neurology

## 2019-10-16 MED ORDER — AMPHETAMINE-DEXTROAMPHETAMINE 15 MG PO TABS: ORAL_TABLET | ORAL | 0 refills | Status: DC

## 2019-10-16 NOTE — Telephone Encounter (Signed)
In response to voicemail from 2:38 this afternoon pt is requesting a refill on her  amphetamine-dextroamphetamine (ADDERALL) 15 MG tablet  [facility]  [phone]

## 2019-10-16 NOTE — Telephone Encounter (Signed)
Checked drug registry. She last refilled rx adderall 09/12/2019 #60. Last seen 09/17/2019 and next f/u 03/17/2020.

## 2019-10-16 NOTE — Addendum Note (Signed)
Addended by: Hope Pigeon on: 10/16/2019 03:15 PM   Modules accepted: Orders

## 2019-10-24 DIAGNOSIS — R600 Localized edema: Secondary | ICD-10-CM | POA: Diagnosis not present

## 2019-10-24 DIAGNOSIS — N183 Chronic kidney disease, stage 3 unspecified: Secondary | ICD-10-CM | POA: Diagnosis not present

## 2019-10-24 DIAGNOSIS — M2550 Pain in unspecified joint: Secondary | ICD-10-CM | POA: Diagnosis not present

## 2019-10-24 DIAGNOSIS — I213 ST elevation (STEMI) myocardial infarction of unspecified site: Secondary | ICD-10-CM | POA: Diagnosis not present

## 2019-10-25 DIAGNOSIS — R600 Localized edema: Secondary | ICD-10-CM | POA: Insufficient documentation

## 2019-10-25 DIAGNOSIS — G35 Multiple sclerosis: Secondary | ICD-10-CM | POA: Diagnosis not present

## 2019-10-25 DIAGNOSIS — I401 Isolated myocarditis: Secondary | ICD-10-CM | POA: Diagnosis not present

## 2019-10-25 DIAGNOSIS — Z9889 Other specified postprocedural states: Secondary | ICD-10-CM | POA: Diagnosis not present

## 2019-10-31 ENCOUNTER — Other Ambulatory Visit: Payer: Self-pay

## 2019-10-31 ENCOUNTER — Encounter (HOSPITAL_COMMUNITY): Payer: Self-pay | Admitting: Emergency Medicine

## 2019-10-31 ENCOUNTER — Emergency Department (HOSPITAL_BASED_OUTPATIENT_CLINIC_OR_DEPARTMENT_OTHER): Payer: PPO

## 2019-10-31 ENCOUNTER — Emergency Department (HOSPITAL_COMMUNITY)
Admission: EM | Admit: 2019-10-31 | Discharge: 2019-11-01 | Disposition: A | Discharge status: Home / Self Care | Payer: PPO | Attending: Emergency Medicine | Admitting: Emergency Medicine

## 2019-10-31 DIAGNOSIS — Z91040 Latex allergy status: Secondary | ICD-10-CM | POA: Diagnosis not present

## 2019-10-31 DIAGNOSIS — Z79899 Other long term (current) drug therapy: Secondary | ICD-10-CM | POA: Diagnosis not present

## 2019-10-31 DIAGNOSIS — G35 Multiple sclerosis: Secondary | ICD-10-CM | POA: Insufficient documentation

## 2019-10-31 DIAGNOSIS — I951 Orthostatic hypotension: Secondary | ICD-10-CM

## 2019-10-31 DIAGNOSIS — R000 Tachycardia, unspecified: Secondary | ICD-10-CM | POA: Diagnosis not present

## 2019-10-31 DIAGNOSIS — R609 Edema, unspecified: Secondary | ICD-10-CM

## 2019-10-31 DIAGNOSIS — Z7722 Contact with and (suspected) exposure to environmental tobacco smoke (acute) (chronic): Secondary | ICD-10-CM | POA: Insufficient documentation

## 2019-10-31 DIAGNOSIS — R0602 Shortness of breath: Secondary | ICD-10-CM | POA: Insufficient documentation

## 2019-10-31 DIAGNOSIS — I252 Old myocardial infarction: Secondary | ICD-10-CM | POA: Diagnosis not present

## 2019-10-31 DIAGNOSIS — M7989 Other specified soft tissue disorders: Secondary | ICD-10-CM

## 2019-10-31 DIAGNOSIS — R531 Weakness: Secondary | ICD-10-CM | POA: Diagnosis not present

## 2019-10-31 DIAGNOSIS — I959 Hypotension, unspecified: Secondary | ICD-10-CM | POA: Diagnosis not present

## 2019-10-31 DIAGNOSIS — E860 Dehydration: Secondary | ICD-10-CM | POA: Insufficient documentation

## 2019-10-31 LAB — TSH: TSH: 1.899 u[IU]/mL (ref 0.350–4.500)

## 2019-10-31 LAB — URINALYSIS, ROUTINE W REFLEX MICROSCOPIC
Bilirubin Urine: NEGATIVE
Glucose, UA: NEGATIVE mg/dL
Ketones, ur: NEGATIVE mg/dL
Nitrite: NEGATIVE
Protein, ur: NEGATIVE mg/dL
Specific Gravity, Urine: 1.005 (ref 1.005–1.030)
pH: 6 (ref 5.0–8.0)

## 2019-10-31 LAB — CBC
HCT: 41.7 % (ref 36.0–46.0)
Hemoglobin: 13.5 g/dL (ref 12.0–15.0)
MCH: 30.5 pg (ref 26.0–34.0)
MCHC: 32.4 g/dL (ref 30.0–36.0)
MCV: 94.1 fL (ref 80.0–100.0)
Platelets: 237 10*3/uL (ref 150–400)
RBC: 4.43 MIL/uL (ref 3.87–5.11)
RDW: 13.4 % (ref 11.5–15.5)
WBC: 6.5 10*3/uL (ref 4.0–10.5)
nRBC: 0 % (ref 0.0–0.2)

## 2019-10-31 LAB — BASIC METABOLIC PANEL
Anion gap: 9 (ref 5–15)
BUN: 13 mg/dL (ref 8–23)
CO2: 25 mmol/L (ref 22–32)
Calcium: 8.3 mg/dL — ABNORMAL LOW (ref 8.9–10.3)
Chloride: 107 mmol/L (ref 98–111)
Creatinine, Ser: 1.21 mg/dL — ABNORMAL HIGH (ref 0.44–1.00)
GFR calc Af Amer: 54 mL/min — ABNORMAL LOW (ref >60)
GFR calc non Af Amer: 46 mL/min — ABNORMAL LOW (ref >60)
Glucose, Bld: 133 mg/dL — ABNORMAL HIGH (ref 70–99)
Potassium: 3.7 mmol/L (ref 3.5–5.1)
Sodium: 141 mmol/L (ref 135–145)

## 2019-10-31 LAB — PROTIME-INR
INR: 1 (ref 0.8–1.2)
Prothrombin Time: 13.1 seconds (ref 11.4–15.2)

## 2019-10-31 LAB — HEPATIC FUNCTION PANEL
ALT: 15 U/L (ref 0–44)
AST: 15 U/L (ref 15–41)
Albumin: 3.7 g/dL (ref 3.5–5.0)
Alkaline Phosphatase: 86 U/L (ref 38–126)
Bilirubin, Direct: <0.1 mg/dL (ref 0.0–0.2)
Total Bilirubin: 0.5 mg/dL (ref 0.3–1.2)
Total Protein: 6.2 g/dL — ABNORMAL LOW (ref 6.5–8.1)

## 2019-10-31 LAB — PHOSPHORUS: Phosphorus: 2.7 mg/dL (ref 2.5–4.6)

## 2019-10-31 LAB — MAGNESIUM: Magnesium: 2.2 mg/dL (ref 1.7–2.4)

## 2019-10-31 LAB — TROPONIN I (HIGH SENSITIVITY)
Troponin I (High Sensitivity): 4 ng/L (ref <18)
Troponin I (High Sensitivity): 5 ng/L (ref <18)

## 2019-10-31 MED ORDER — SODIUM CHLORIDE 0.9% FLUSH: 3.0000 mL | Freq: Once | INTRAVENOUS | Status: DC

## 2019-10-31 MED ORDER — SODIUM CHLORIDE 0.9 % IV BOLUS
1000.0000 mL | Freq: Once | INTRAVENOUS | Status: AC
  Administered 2019-10-31: 1000 mL via INTRAVENOUS

## 2019-10-31 MED ORDER — SODIUM CHLORIDE 0.9 % IV BOLUS
500.0000 mL | Freq: Once | INTRAVENOUS | Status: AC
  Administered 2019-10-31: 500 mL via INTRAVENOUS

## 2019-10-31 MED ORDER — CEPHALEXIN 250 MG PO CAPS
1000.0000 mg | ORAL_CAPSULE | Freq: Once | ORAL | Status: AC
  Administered 2019-10-31: 1000 mg via ORAL
  Filled 2019-10-31: qty 4

## 2019-10-31 MED ORDER — CEPHALEXIN 500 MG PO CAPS: 1000.0000 mg | ORAL_CAPSULE | Freq: Two times a day (BID) | ORAL | 0 refills | Status: DC

## 2019-10-31 NOTE — ED Triage Notes (Signed)
Pt arrives via EMS from home with generalized weakness, SOB, and 69/41 initially. Given 562ml NS. Repeat bp 121/74. 18g LAC. Pt alert, oriented x4. EKG NSR. cbg  134. 97.0 T

## 2019-10-31 NOTE — Discharge Instructions (Signed)
1.  Your urinalysis is slightly positive for urinary tract infection.  A urine culture has been ordered.  This will be ready in 24 to 48 hours.  Notify your doctor that this needs review.  You have been given Keflex, an antibiotic to take until your doctor can review your urine culture results. 2.  Try to stay hydrated in be very careful when you change positions especially from lying down or sitting to standing.  Make changes slowly and sit back down and elevate your legs if you feel lightheaded. 3.  Return to the emergency department if you have shortness of breath, chest pain, fainting or other concerning symptoms.

## 2019-10-31 NOTE — ED Provider Notes (Signed)
[place] EMERGENCY DEPARTMENT Provider Note   CSN: [id number] Arrival date & time: 10/31/19  1512     History   Chief Complaint Chief Complaint  Patient presents with   Hypotension    HPI [name] is a 68 y.o. female.     HPI Patient reports that she experienced severe generalized weakness today.  She reports that she also felt short of breath.  She did not have any associated chest pain.  She did take her blood pressure at home and noted that it was low.  Her measured pressure was in the mid 80s.  Patient reports she was concerned because she felt this way when she had a heart attack.  Patient reports she had been seen by her doctor for swelling in her legs.  She reports both legs were fairly swollen but one slightly more than the other.  She reports her feet were like muffins and uncomfortable to walk on.  She has been taking Lasix daily.  She reports is a very low dose.  She reports she continues to take in quite a bit of fluids to stay hydrated however.  She reports she has been elevating her legs and the swelling has gone down significantly.  She reports she did not have an ultrasound for DVT but is scheduled to get 1 in a few weeks.  No personal history of DVT or PE.  She has not had cough or fever. Past Medical History:  Diagnosis Date   Arthritis    back   Asthma    20 yrs., ago one episode   Dysesthesia    bilateral feet   Dyspnea    Family history of adverse reaction to anesthesia    mother -- ponv   Fatigue    Fecal incontinence    Frequent falls    due to MS   GERD (gastroesophageal reflux disease)    Heart murmur    first noticed 41 yrs. 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   PONV (postoperative nausea and vomiting)    Pulmonary nodule, right    pulmologist-  dr Milinda Hirschfeld--  stable per ct 05-17-2016   Short of breath on exertion    Sjogren's disease ([place])    dryness of eyes, mouth   Spastic gait    Transverse myelitis (HCC)    T4   Wears bilateral ankle braces    AFO braces for stability    Patient Active Problem List   Diagnosis Date Noted   HNP (herniated nucleus pulposus), cervical 05/17/2019   Memory loss 03/14/2019   Excessive daytime sleepiness 10/17/2017   Vitamin D deficiency 12/09/2016   Attention deficit disorder 12/09/2016   Sjogren's disease ([place]) 05/03/2016   Other headache syndrome 09/08/2015   Transverse myelitis ([place]) 05/06/2015   Dysesthesia 05/06/2015   Neck pain 05/06/2015   Occipital neuralgia 05/06/2015   Spastic gait 02/05/2015   Urinary frequency  02/05/2015   Other fatigue 02/05/2015   Multiple lung nodules on CT 09/24/2013   Nipple discharge 08/04/2012   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   VOCAL CORD DISORDER 10/21/2009   DYSPHAGIA, OROPHARYNGEAL PHASE 10/21/2009   PULMONARY NODULE, RIGHT UPPER LOBE 01/22/2009   DYSPNEA 09/24/2008   BRONCHITIS, OBSTRUCTIVE CHRONIC, WITH EXACERBATION 09/10/2008   G E R D 09/10/2008   MULTIPLE SCLEROSIS 03/13/2008   MIGRAINE, CHRONIC 03/13/2008   CHRONIC RHINITIS 03/13/2008   COUGH 03/13/2008    Past Surgical History:  Procedure Laterality Date   ANAL RECTAL MANOMETRY N/A 08/18/2016   Procedure: ANO RECTAL MANOMETRY;  Surgeon: Leighton Ruff, MD;  Location: [facility];  Service: Endoscopy;  Laterality: N/A;   ANTERIOR CERVICAL DECOMP/DISCECTOMY FUSION  2004;   2003   C4 -- C5 (2004)/   C3 -- C4 (2003)   ANTERIOR CERVICAL DECOMP/DISCECTOMY FUSION N/A 05/17/2019   Procedure:  EVACUATION HEMATOMA OF POST OP ANTERIOR CERVICAL DECOMPRESSION FUSION;  Surgeon: Jovita Gamma, MD;  Location: [place];  Service: Neurosurgery;  Laterality: N/A;   ANTERIOR CERVICAL DECOMP/DISCECTOMY FUSION N/A 05/17/2019   Procedure: REMOVAL OF TETHER CERVICAL PLATE, ANTERIOR CERVICAL DECOMPRESSION/DISCECTOMY FUSION CERVICAL FIVE- CERVICAL SIX, CERVICAL SIX- CERVICAL SEVEN;  Surgeon: Jovita Gamma, MD;  Location: [place];  Service: Neurosurgery;  Laterality: N/A;   APPENDECTOMY  06/22/2011   laparoscopic   BILATERAL SALPINGOOPHORECTOMY  1982   BLADDER SUSPENSION  1994   sling   BREAST ENHANCEMENT SURGERY Bilateral    BREAST IMPLANT REMOVAL Bilateral 12/03/2008   BUNIONECTOMY     CATARACT EXTRACTION W/ INTRAOCULAR LENS  IMPLANT, BILATERAL  2015   ELBOW SURGERY Right    HEMORROIDECTOMY  08/05/2010;  2013   INSERTION SACRAL NERVE STIMULATOR TEST WIRE  09-28-2016   dr Marcello Moores   INTERCOSTAL NERVE BLOCK  2015   occipital   MENISCUS REPAIR Left 09/26/2018   RECTAL ULTRASOUND N/A 08/18/2016   Procedure: RECTAL ULTRASOUND;  Surgeon: Leighton Ruff, MD;  [facility];  Service: Endoscopy;  Laterality: N/A;   SHOULDER ARTHROSCOPY Left    SHOULDER ARTHROSCOPY WITH DISTAL CLAVICLE RESECTION Right 09/21/2007   w/  Acrominoplasty,  Debridement Rotator Cuff,  CA ligament release   TONSILLECTOMY  age 69   TRIGGER FINGER RELEASE Left 01/16/2015   Procedure: LEFT THUMB TRIGGER RELEASE ;  Surgeon: Leanora Cover, MD;  Location: [place];  Service: Orthopedics;  Laterality: Left;   VAGINAL HYSTERECTOMY  1981     OB History   No obstetric history on file.      Home Medications    Prior to Admission medications   Medication Sig Start Date End Date Taking? Authorizing Provider  Amantadine HCl 100 MG tablet Take 1 tablet (100 mg total) by mouth 3 (three) times daily. 07/23/19   Sater, Nanine Means, MD  amphetamine-dextroamphetamine (ADDERALL) 15 MG tablet Take one  po qAm and one po 4 hours later 10/16/19   Britt Bottom, MD  Biotin 10000 MCG TABS Take 1 tablet by mouth daily.    [provider]  calcium carbonate (OS-CAL) 600 MG TABS tablet Take 1,200 mg by mouth.     [provider]  carvedilol (COREG) 3.125 MG tablet Take 3.125 mg by mouth 2 (two) times daily with a meal.    [provider]  cephALEXin (KEFLEX) 500 MG capsule Take 2 capsules (1,000 mg total) by mouth 2 (two) times daily. 10/31/19   Charlesetta Shanks, MD  Dalfampridine (4-AMINOPYRIDINE) POWD Take 5 mg by mouth  3 (three) times daily. Pharmacy dispense 270 capsules with 3 refills. Thank you 08/28/19   Sater, Nanine Means, MD  denosumab (PROLIA) 60 MG/ML SOLN injection Inject 60 mg into the skin every 6 (six) months. Administer in upper arm, thigh, or abdomen    [provider]  donepezil (ARICEPT) 10 MG tablet Take 1 tablet (10 mg total) by mouth at bedtime. 09/17/19   Sater, Nanine Means, MD  famotidine (PEPCID) 10 MG tablet Take 10 mg by mouth daily as needed for heartburn or indigestion.    [provider]  Ibuprofen (ADVIL PO) Take 1 Dose by mouth as needed.    [provider]  lamoTRIgine (LAMICTAL) 150 MG tablet TAKE 1 TABLET BY MOUTH TWICE A DAY 08/06/19   Sater, Nanine Means, MD  losartan (COZAAR) 25 MG tablet Take 12.5 mg by mouth daily.    [provider]  Meclizine HCl 25 MG CHEW Chew 2 tablets by mouth as needed (dizziness).     [provider]  Multiple Vitamins-Minerals (MULTIVITAMIN PO) Take 1 tablet by mouth daily. Brand name - Mature Multivitamin from [employer], Historical, MD  pantoprazole (PROTONIX) 40 MG tablet Take 1 tablet (40 mg total) by mouth every morning. Patient taking differently: Take 40 mg by mouth 2 (two) times daily.  08/03/16   Javier Glazier, MD  rOPINIRole (REQUIP) 0.25 MG tablet TAKE 1 TO 2 TABLETS BY MOUTH AT NIGHT Patient taking differently: Take 0.5 mg by mouth at bedtime. TAKE 1  TO 2 TABLETS BY MOUTH AT NIGHT 10/19/18   Sater, Nanine Means, MD  Vitamin D, Ergocalciferol, (DRISDOL) 50000 UNITS CAPS capsule Take 1 capsule (50,000 Units total) by mouth every 7 (seven) days. When finished, take an otc daily vitamin d supplement of 5,000iu. Patient taking differently: Take 50,000 Units by mouth every 7 (seven) days.  02/11/15   Sater, Nanine Means, MD    Family History Family History  Problem Relation Age of Onset   Liver disease Father    Cirrhosis Father    Stroke Mother    Cancer Mother        oropharyngeal   Heart attack Mother    Anesthesia problems Mother        post-op nausea   Cancer Brother        twin; tonsillar?   Cancer Sister 19       breast ca, lung ca   Cancer Other        Nephew; appendiceal carcnoid, melanoma   Cancer Paternal Grandmother        cervical    Social History Social History   Tobacco Use   Smoking status: Passive Smoke Exposure - Never Smoker   Smokeless tobacco: Never Used   Tobacco comment: parents, husband, & multiple family members  Substance Use Topics   Alcohol use: No    Alcohol/week: 0.0 standard drinks   Drug use: No     Allergies   Other, Betaseron [interferon beta-1b], Codeine, Gilenya [fingolimod hydrochloride], Morphine and related, Sumatriptan, Tysabri [natalizumab], and Latex   Review of Systems Review of Systems 10 Systems reviewed and are negative for acute change except as noted in the HPI.   Physical Exam Updated Vital Signs BP 121/76    Pulse 76    Temp 97.7 F (36.5 C)    Resp 15    SpO2 98%   Physical Exam Constitutional:      Comments: Alert and nontoxic.  Mental status is  clear.  No respiratory distress.  As I enter the room the patient is standing at bedside independently.  HENT:     Head: Normocephalic and atraumatic.     Nose: Nose normal.     Mouth/Throat:     Mouth: Mucous membranes are moist.  Eyes:     Extraocular Movements: Extraocular movements intact.      Conjunctiva/sclera: Conjunctivae normal.  Neck:     Musculoskeletal: Neck supple.  Cardiovascular:     Rate and Rhythm: Normal rate and regular rhythm.  Pulmonary:     Effort: Pulmonary effort is normal.     Breath sounds: Normal breath sounds.  Abdominal:     General: There is no distension.     Palpations: Abdomen is soft.     Tenderness: There is no abdominal tenderness. There is no guarding.  Musculoskeletal:     Comments: Patient does not have significant lower extremity swelling.  The calves are nontender.  There is some trace pitting around the ankles.  Skin:    General: Skin is warm and dry.  Neurological:     Comments: Patient is alert and appropriate.  She follows commands without difficulty.  No focal motor deficits.  Patient does have known history of MS.  Psychiatric:        Mood and Affect: Mood normal.      ED Treatments / Results  Labs (all labs ordered are listed, but only abnormal results are displayed) Labs Reviewed  BASIC METABOLIC PANEL - Abnormal; Notable for the following components:      Result Value   Glucose, Bld 133 (*)    Creatinine, Ser 1.21 (*)    Calcium 8.3 (*)    GFR calc non Af Amer 46 (*)    GFR calc Af Amer 54 (*)    All other components within normal limits  URINALYSIS, ROUTINE W REFLEX MICROSCOPIC - Abnormal; Notable for the following components:   Color, Urine STRAW (*)    Hgb urine dipstick SMALL (*)    Leukocytes,Ua SMALL (*)    Bacteria, UA RARE (*)    All other components within normal limits  HEPATIC FUNCTION PANEL - Abnormal; Notable for the following components:   Total Protein 6.2 (*)    All other components within normal limits  URINE CULTURE  CBC  PROTIME-INR  TSH  MAGNESIUM  PHOSPHORUS  CBG MONITORING, ED  TROPONIN I (HIGH SENSITIVITY)  TROPONIN I (HIGH SENSITIVITY)    EKG EKG Interpretation  Date/Time:  Wednesday October 31 2019 15:45:17 EST Ventricular Rate:  77 PR Interval:  178 QRS Duration: 76 QT  Interval:  400 QTC Calculation: 452 R Axis:   56 Text Interpretation: Normal sinus rhythm Cannot rule out Anterior infarct , age undetermined Abnormal ECG similar to previous except normalization of previously inverted t waves. Confirmed by Charlesetta Shanks [phone]) on 10/31/2019 6:12:39 PM   Radiology Vas [place] Lower Extremity Venous (dvt) (only Mc & Wl 7a-7p)  Result Date: 10/31/2019  Lower Venous Study Indications: Swelling, and Edema.  Comparison Study: no prior Performing Technologist: Abram Sander RVS  Examination Guidelines: A complete evaluation includes B-mode imaging, spectral Doppler, color Doppler, and power Doppler as needed of all accessible portions of each vessel. Bilateral testing is considered an integral part of a complete examination. Limited examinations for reoccurring indications may be performed as noted.  +---------+---------------+---------+-----------+----------+--------------+  RIGHT     Compressibility Phasicity Spontaneity Properties Thrombus Aging  +---------+---------------+---------+-----------+----------+--------------+  CFV       Full  Yes       Yes                                    +---------+---------------+---------+-----------+----------+--------------+  SFJ       Full                                                             +---------+---------------+---------+-----------+----------+--------------+  FV Prox   Full                                                             +---------+---------------+---------+-----------+----------+--------------+  FV Mid    Full                                                             +---------+---------------+---------+-----------+----------+--------------+  FV Distal Full                                                             +---------+---------------+---------+-----------+----------+--------------+  PFV       Full                                                              +---------+---------------+---------+-----------+----------+--------------+  POP       Full            Yes       Yes                                    +---------+---------------+---------+-----------+----------+--------------+  PTV       Full                                                             +---------+---------------+---------+-----------+----------+--------------+  PERO                                                       Not visualized  +---------+---------------+---------+-----------+----------+--------------+   +---------+---------------+---------+-----------+----------+--------------+  LEFT      Compressibility Phasicity Spontaneity Properties Thrombus Aging  +---------+---------------+---------+-----------+----------+--------------+  CFV  Full            Yes       Yes                                    +---------+---------------+---------+-----------+----------+--------------+  SFJ       Full                                                             +---------+---------------+---------+-----------+----------+--------------+  FV Prox   Full                                                             +---------+---------------+---------+-----------+----------+--------------+  FV Mid    Full                                                             +---------+---------------+---------+-----------+----------+--------------+  FV Distal Full                                                             +---------+---------------+---------+-----------+----------+--------------+  PFV       Full                                                             +---------+---------------+---------+-----------+----------+--------------+  POP       Full            Yes       Yes                                    +---------+---------------+---------+-----------+----------+--------------+  PTV       Full                                                              +---------+---------------+---------+-----------+----------+--------------+  PERO                                                       Not visualized  +---------+---------------+---------+-----------+----------+--------------+     Summary: Right: There is no  evidence of deep vein thrombosis in the lower extremity. No cystic structure found in the popliteal fossa. Left: There is no evidence of deep vein thrombosis in the lower extremity. No cystic structure found in the popliteal fossa.  *See table(s) above for measurements and observations.    Preliminary     Procedures Procedures (including critical care time)  Medications Ordered in ED Medications  sodium chloride flush (NS) 0.9 % injection 3 mL (has no administration in time range)  cephALEXin (KEFLEX) capsule 1,000 mg (has no administration in time range)  sodium chloride 0.9 % bolus 500 mL (0 mLs Intravenous Stopped 10/31/19 1953)  sodium chloride 0.9 % bolus 1,000 mL (1,000 mLs Intravenous New Bag/Given 10/31/19 2043)     Initial Impression / Assessment and Plan / ED Course  I have reviewed the triage vital signs and the nursing notes.  Pertinent labs & imaging results that were available during my care of the patient were reviewed by me and considered in my medical decision making (see chart for details).       No focal neurologic deficits to suggest stroke like etiology for the patient's symptoms.  She did have concern for possible recurrence of heart condition.  Review of EMR indicates the patient had normal coronary arteries and does not have ischemic disease.  She did have an episode of myocarditis with a EF that had normalized in July.  2 sets of troponins are negative.  Patient does not appear to have risk factors for ischemic cardiac disease.  I do not appreciate murmur or volume overload to suggest recurrence of myocarditis.  DVT studies were obtained.  Negative.  Patient has been taking Lasix and creatinine is slightly elevated.   She was orthostatic on arrival.  Patient was resuscitated with 1 L normal saline.  Blood pressures normalized and patient's orthostatic blood pressures were stable.  She was no longer symptomatic with standing.  It appears this is secondary to volume depletion with Lasix use.  Patient's lower extremity swelling is likely due to venous stasis.  She is counseled on elevating and reports she already has compression hose.  Urinalysis was trace positive.  With some generalized constitutional symptoms will add Keflex until culture results return.  Patient is counseled to notify her PCP office to follow-up on results of urine culture.  Return precautions reviewed.  Final Clinical Impressions(s) / ED Diagnoses   Final diagnoses:  Dehydration  Orthostatic hypotension    ED Discharge Orders         Ordered    cephALEXin (KEFLEX) 500 MG capsule  2 times daily     10/31/19 2326           Charlesetta Shanks, MD 10/31/19 2349

## 2019-10-31 NOTE — ED Notes (Signed)
Pt became dizzy and shob upon standing for orthostatic vs

## 2019-10-31 NOTE — ED Notes (Signed)
VAS [place] complete

## 2019-10-31 NOTE — Progress Notes (Signed)
Lower extremity venous has been completed.   Preliminary results in CV Proc.   Abram Sander 10/31/2019 6:50 PM

## 2019-11-02 DIAGNOSIS — G373 Acute transverse myelitis in demyelinating disease of central nervous system: Secondary | ICD-10-CM | POA: Diagnosis not present

## 2019-11-02 DIAGNOSIS — N390 Urinary tract infection, site not specified: Secondary | ICD-10-CM | POA: Diagnosis not present

## 2019-11-02 DIAGNOSIS — I959 Hypotension, unspecified: Secondary | ICD-10-CM | POA: Diagnosis not present

## 2019-11-02 DIAGNOSIS — R197 Diarrhea, unspecified: Secondary | ICD-10-CM | POA: Diagnosis not present

## 2019-11-02 DIAGNOSIS — G35 Multiple sclerosis: Secondary | ICD-10-CM | POA: Diagnosis not present

## 2019-11-02 DIAGNOSIS — N183 Chronic kidney disease, stage 3 unspecified: Secondary | ICD-10-CM | POA: Diagnosis not present

## 2019-11-02 DIAGNOSIS — G609 Hereditary and idiopathic neuropathy, unspecified: Secondary | ICD-10-CM | POA: Diagnosis not present

## 2019-11-02 DIAGNOSIS — R2689 Other abnormalities of gait and mobility: Secondary | ICD-10-CM | POA: Diagnosis not present

## 2019-11-03 LAB — URINE CULTURE

## 2019-11-05 DIAGNOSIS — R197 Diarrhea, unspecified: Secondary | ICD-10-CM | POA: Diagnosis not present

## 2019-11-07 DIAGNOSIS — A09 Infectious gastroenteritis and colitis, unspecified: Secondary | ICD-10-CM | POA: Diagnosis not present

## 2019-11-07 DIAGNOSIS — K2100 Gastro-esophageal reflux disease with esophagitis, without bleeding: Secondary | ICD-10-CM | POA: Diagnosis not present

## 2019-11-09 DIAGNOSIS — K529 Noninfective gastroenteritis and colitis, unspecified: Secondary | ICD-10-CM | POA: Diagnosis not present

## 2019-11-09 DIAGNOSIS — R110 Nausea: Secondary | ICD-10-CM | POA: Diagnosis not present

## 2019-11-09 DIAGNOSIS — N1831 Chronic kidney disease, stage 3a: Secondary | ICD-10-CM | POA: Diagnosis not present

## 2019-11-09 DIAGNOSIS — I959 Hypotension, unspecified: Secondary | ICD-10-CM | POA: Diagnosis not present

## 2019-11-09 DIAGNOSIS — R197 Diarrhea, unspecified: Secondary | ICD-10-CM | POA: Diagnosis not present

## 2019-11-19 ENCOUNTER — Other Ambulatory Visit: Payer: Self-pay

## 2019-11-19 MED ORDER — ROPINIROLE HCL 0.25 MG PO TABS: ORAL_TABLET | ORAL | 3 refills | Status: DC

## 2019-11-20 ENCOUNTER — Other Ambulatory Visit: Payer: Self-pay | Admitting: *Deleted

## 2019-11-20 MED ORDER — ROPINIROLE HCL 0.25 MG PO TABS: ORAL_TABLET | ORAL | 3 refills | Status: DC

## 2019-11-26 DIAGNOSIS — Z1159 Encounter for screening for other viral diseases: Secondary | ICD-10-CM | POA: Diagnosis not present

## 2019-11-28 ENCOUNTER — Telehealth: Payer: Self-pay | Admitting: Neurology

## 2019-11-28 DIAGNOSIS — Z8500 Personal history of malignant neoplasm of unspecified digestive organ: Secondary | ICD-10-CM | POA: Diagnosis not present

## 2019-11-28 DIAGNOSIS — K219 Gastro-esophageal reflux disease without esophagitis: Secondary | ICD-10-CM | POA: Diagnosis not present

## 2019-11-28 DIAGNOSIS — K529 Noninfective gastroenteritis and colitis, unspecified: Secondary | ICD-10-CM | POA: Diagnosis not present

## 2019-11-28 DIAGNOSIS — A0472 Enterocolitis due to Clostridium difficile, not specified as recurrent: Secondary | ICD-10-CM | POA: Diagnosis not present

## 2019-11-28 DIAGNOSIS — K449 Diaphragmatic hernia without obstruction or gangrene: Secondary | ICD-10-CM | POA: Diagnosis not present

## 2019-11-28 DIAGNOSIS — K5730 Diverticulosis of large intestine without perforation or abscess without bleeding: Secondary | ICD-10-CM | POA: Diagnosis not present

## 2019-11-28 NOTE — Telephone Encounter (Signed)
Dr. Sater- FYI 

## 2019-11-28 NOTE — Telephone Encounter (Signed)
Patient called to advise that Dr.maygod would like to begin patient on a low dose of regalin and was told to advise Dr.Sater. Please follow up if necessary.

## 2019-11-30 DIAGNOSIS — K529 Noninfective gastroenteritis and colitis, unspecified: Secondary | ICD-10-CM | POA: Diagnosis not present

## 2019-12-04 ENCOUNTER — Inpatient Hospital Stay (HOSPITAL_COMMUNITY)
Admission: EM | Admit: 2019-12-04 | Discharge: 2019-12-07 | DRG: 641 | Disposition: A | Discharge status: Home / Self Care | Payer: PPO | Source: Ambulatory Visit | Attending: Internal Medicine | Admitting: Internal Medicine

## 2019-12-04 ENCOUNTER — Encounter (HOSPITAL_COMMUNITY): Payer: Self-pay | Admitting: Emergency Medicine

## 2019-12-04 ENCOUNTER — Emergency Department (HOSPITAL_COMMUNITY): Payer: PPO

## 2019-12-04 ENCOUNTER — Encounter (HOSPITAL_COMMUNITY): Payer: Self-pay

## 2019-12-04 ENCOUNTER — Other Ambulatory Visit: Payer: Self-pay

## 2019-12-04 ENCOUNTER — Emergency Department (HOSPITAL_COMMUNITY)
Admission: EM | Admit: 2019-12-04 | Discharge: 2019-12-04 | Disposition: A | Discharge status: Home / Self Care | Payer: PPO | Source: Home / Self Care

## 2019-12-04 DIAGNOSIS — Z888 Allergy status to other drugs, medicaments and biological substances status: Secondary | ICD-10-CM | POA: Diagnosis not present

## 2019-12-04 DIAGNOSIS — R7989 Other specified abnormal findings of blood chemistry: Secondary | ICD-10-CM | POA: Diagnosis present

## 2019-12-04 DIAGNOSIS — N179 Acute kidney failure, unspecified: Secondary | ICD-10-CM | POA: Diagnosis present

## 2019-12-04 DIAGNOSIS — F988 Other specified behavioral and emotional disorders with onset usually occurring in childhood and adolescence: Secondary | ICD-10-CM | POA: Diagnosis not present

## 2019-12-04 DIAGNOSIS — I959 Hypotension, unspecified: Secondary | ICD-10-CM | POA: Insufficient documentation

## 2019-12-04 DIAGNOSIS — M3500 Sicca syndrome, unspecified: Secondary | ICD-10-CM | POA: Diagnosis present

## 2019-12-04 DIAGNOSIS — Z5321 Procedure and treatment not carried out due to patient leaving prior to being seen by health care provider: Secondary | ICD-10-CM | POA: Insufficient documentation

## 2019-12-04 DIAGNOSIS — Z7722 Contact with and (suspected) exposure to environmental tobacco smoke (acute) (chronic): Secondary | ICD-10-CM | POA: Diagnosis present

## 2019-12-04 DIAGNOSIS — Z885 Allergy status to narcotic agent status: Secondary | ICD-10-CM

## 2019-12-04 DIAGNOSIS — Z8619 Personal history of other infectious and parasitic diseases: Secondary | ICD-10-CM | POA: Diagnosis not present

## 2019-12-04 DIAGNOSIS — Z79899 Other long term (current) drug therapy: Secondary | ICD-10-CM

## 2019-12-04 DIAGNOSIS — E860 Dehydration: Principal | ICD-10-CM | POA: Diagnosis present

## 2019-12-04 DIAGNOSIS — Z981 Arthrodesis status: Secondary | ICD-10-CM | POA: Diagnosis not present

## 2019-12-04 DIAGNOSIS — Z20828 Contact with and (suspected) exposure to other viral communicable diseases: Secondary | ICD-10-CM | POA: Diagnosis present

## 2019-12-04 DIAGNOSIS — K449 Diaphragmatic hernia without obstruction or gangrene: Secondary | ICD-10-CM | POA: Diagnosis not present

## 2019-12-04 DIAGNOSIS — Z9882 Breast implant status: Secondary | ICD-10-CM

## 2019-12-04 DIAGNOSIS — I5181 Takotsubo syndrome: Secondary | ICD-10-CM | POA: Diagnosis not present

## 2019-12-04 DIAGNOSIS — R110 Nausea: Secondary | ICD-10-CM | POA: Diagnosis not present

## 2019-12-04 DIAGNOSIS — K219 Gastro-esophageal reflux disease without esophagitis: Secondary | ICD-10-CM | POA: Diagnosis not present

## 2019-12-04 DIAGNOSIS — K52831 Collagenous colitis: Secondary | ICD-10-CM | POA: Diagnosis present

## 2019-12-04 DIAGNOSIS — E876 Hypokalemia: Secondary | ICD-10-CM | POA: Diagnosis not present

## 2019-12-04 DIAGNOSIS — G2581 Restless legs syndrome: Secondary | ICD-10-CM | POA: Diagnosis present

## 2019-12-04 DIAGNOSIS — Z9049 Acquired absence of other specified parts of digestive tract: Secondary | ICD-10-CM

## 2019-12-04 DIAGNOSIS — Z91040 Latex allergy status: Secondary | ICD-10-CM

## 2019-12-04 DIAGNOSIS — M810 Age-related osteoporosis without current pathological fracture: Secondary | ICD-10-CM | POA: Diagnosis present

## 2019-12-04 DIAGNOSIS — I951 Orthostatic hypotension: Secondary | ICD-10-CM | POA: Diagnosis not present

## 2019-12-04 DIAGNOSIS — G8929 Other chronic pain: Secondary | ICD-10-CM | POA: Diagnosis not present

## 2019-12-04 DIAGNOSIS — R197 Diarrhea, unspecified: Secondary | ICD-10-CM

## 2019-12-04 DIAGNOSIS — R109 Unspecified abdominal pain: Secondary | ICD-10-CM | POA: Diagnosis present

## 2019-12-04 DIAGNOSIS — G3184 Mild cognitive impairment, so stated: Secondary | ICD-10-CM | POA: Diagnosis not present

## 2019-12-04 DIAGNOSIS — Z8249 Family history of ischemic heart disease and other diseases of the circulatory system: Secondary | ICD-10-CM

## 2019-12-04 DIAGNOSIS — Z8719 Personal history of other diseases of the digestive system: Secondary | ICD-10-CM

## 2019-12-04 DIAGNOSIS — Z823 Family history of stroke: Secondary | ICD-10-CM

## 2019-12-04 DIAGNOSIS — J9811 Atelectasis: Secondary | ICD-10-CM | POA: Diagnosis not present

## 2019-12-04 DIAGNOSIS — Z90710 Acquired absence of both cervix and uterus: Secondary | ICD-10-CM

## 2019-12-04 DIAGNOSIS — R531 Weakness: Secondary | ICD-10-CM | POA: Insufficient documentation

## 2019-12-04 DIAGNOSIS — G35 Multiple sclerosis: Secondary | ICD-10-CM | POA: Diagnosis not present

## 2019-12-04 HISTORY — DX: Hypokalemia: E87.6

## 2019-12-04 LAB — COMPREHENSIVE METABOLIC PANEL
ALT: 16 U/L (ref 0–44)
AST: 16 U/L (ref 15–41)
Albumin: 4 g/dL (ref 3.5–5.0)
Alkaline Phosphatase: 84 U/L (ref 38–126)
Anion gap: 9 (ref 5–15)
BUN: 15 mg/dL (ref 8–23)
CO2: 28 mmol/L (ref 22–32)
Calcium: 8.9 mg/dL (ref 8.9–10.3)
Chloride: 103 mmol/L (ref 98–111)
Creatinine, Ser: 1.36 mg/dL — ABNORMAL HIGH (ref 0.44–1.00)
GFR calc Af Amer: 47 mL/min — ABNORMAL LOW (ref >60)
GFR calc non Af Amer: 40 mL/min — ABNORMAL LOW (ref >60)
Glucose, Bld: 110 mg/dL — ABNORMAL HIGH (ref 70–99)
Potassium: 3.4 mmol/L — ABNORMAL LOW (ref 3.5–5.1)
Sodium: 140 mmol/L (ref 135–145)
Total Bilirubin: 0.4 mg/dL (ref 0.3–1.2)
Total Protein: 6.9 g/dL (ref 6.5–8.1)

## 2019-12-04 LAB — CBC
HCT: 45.5 % (ref 36.0–46.0)
Hemoglobin: 15.1 g/dL — ABNORMAL HIGH (ref 12.0–15.0)
MCH: 30.8 pg (ref 26.0–34.0)
MCHC: 33.2 g/dL (ref 30.0–36.0)
MCV: 92.9 fL (ref 80.0–100.0)
Platelets: 292 10*3/uL (ref 150–400)
RBC: 4.9 MIL/uL (ref 3.87–5.11)
RDW: 13 % (ref 11.5–15.5)
WBC: 8.8 10*3/uL (ref 4.0–10.5)
nRBC: 0 % (ref 0.0–0.2)

## 2019-12-04 LAB — BRAIN NATRIURETIC PEPTIDE: B Natriuretic Peptide: 515.9 pg/mL — ABNORMAL HIGH (ref 0.0–100.0)

## 2019-12-04 LAB — LIPASE, BLOOD: Lipase: 33 U/L (ref 11–51)

## 2019-12-04 LAB — POC SARS CORONAVIRUS 2 AG -  ED: SARS Coronavirus 2 Ag: NEGATIVE

## 2019-12-04 LAB — MAGNESIUM: Magnesium: 1.9 mg/dL (ref 1.7–2.4)

## 2019-12-04 MED ORDER — SODIUM CHLORIDE 0.9 % IV BOLUS
1000.0000 mL | Freq: Once | INTRAVENOUS | Status: AC
  Administered 2019-12-04: 1000 mL via INTRAVENOUS

## 2019-12-04 MED ORDER — POTASSIUM CHLORIDE IN NACL 20-0.9 MEQ/L-% IV SOLN
INTRAVENOUS | Status: AC
  Administered 2019-12-05: 02:00:00 via INTRAVENOUS
  Filled 2019-12-04 (×2): qty 1000

## 2019-12-04 MED ORDER — ENOXAPARIN SODIUM 40 MG/0.4ML ~~LOC~~ SOLN
40.0000 mg | Freq: Every day | SUBCUTANEOUS | Status: DC
  Administered 2019-12-05 – 2019-12-07 (×3): 40 mg via SUBCUTANEOUS
  Filled 2019-12-04 (×3): qty 0.4

## 2019-12-04 MED ORDER — ACETAMINOPHEN 325 MG PO TABS
650.0000 mg | ORAL_TABLET | Freq: Four times a day (QID) | ORAL | Status: DC | PRN
  Administered 2019-12-06: 650 mg via ORAL
  Filled 2019-12-04: qty 2

## 2019-12-04 MED ORDER — SODIUM CHLORIDE 0.9% FLUSH
3.0000 mL | Freq: Once | INTRAVENOUS | Status: AC
  Administered 2019-12-04: 3 mL via INTRAVENOUS

## 2019-12-04 MED ORDER — SODIUM CHLORIDE 0.9 % IV SOLN
Freq: Once | INTRAVENOUS | Status: AC
  Administered 2019-12-04: 23:00:00 via INTRAVENOUS

## 2019-12-04 MED ORDER — ACETAMINOPHEN 650 MG RE SUPP: 650.0000 mg | Freq: Four times a day (QID) | RECTAL | Status: DC | PRN

## 2019-12-04 NOTE — ED Triage Notes (Signed)
Pt states she has had ongoing diarrhea for weeks. Pt had endoscopy and colonoscopy on Wednesday of last week- did not find anything significant. Pt feels dehydrated and weak. Pt left [facility] waiting room due to the wait.

## 2019-12-04 NOTE — H&P (Signed)
TRH H&P    Patient Demographics:    [name], is a 68 y.o. female  MRN: [id number]  DOB - [birth year]  Admit Date - 12/04/2019  Referring MD/NP/PA:  Carmin Muskrat  Outpatient Primary MD for the patient is Crist Infante, MD  Patient coming from:  home  Chief complaint-  diarrhea   HPI:    [name]  is a 68 y.o. female,  w multiple sclerosis, h/o transverse myelitis T4, Sjogrens, Gerd/ hiatal hernia , recent C. Diff diarrhea, apparently c/o increase diarrhea over the past few days along with nausea.  Pt has had recent EGD/colonoscopy w [facility] per patient negative.  Pt states in general just feeling weak.  Pt denies fever, chills, cough, cp, palp, sob, emesis, abd pain, brbpr, black stool, dysuria, hematuria. Pt presented due to generalized malaise.   In ED,  T 98.4, P 92 R 16, Bp 102/86  Pox 100% on RA Wt 67.1kg  CXR IMPRESSION: Subsegmental atelectasis at the left lung base. Otherwise no acute abnormality. No evidence of CHF.  Lipase 33 Na 140, K 3.4, Bun 15, Creatinine 1.36 Ast 16, Alt 16 Wbc 8.8, Hgb 15.1, Plt 292 BNP 515.9 Magnesium 1.9  Pt will be admitted for generalized malaise, hypokalemia, orthostatic hypotension     Review of systems:    In addition to the HPI above,  No Fever-chills, No Headache, No changes with Vision or hearing, No problems swallowing food or Liquids, No Chest pain, Cough or Shortness of Breath, No Abdominal pain, No  Vomiting, No Blood in stool or Urine, No dysuria, No new skin rashes or bruises, No new joints pains-aches,  No new weakness, tingling, numbness in any extremity, No recent weight gain or loss, No polyuria, polydypsia or polyphagia, No significant Mental Stressors.  All other systems reviewed and are negative.    Past History of the following :    Past Medical History:  Diagnosis Date   Arthritis    back   Asthma      20 yrs., ago one episode   Dysesthesia    bilateral feet   Dyspnea    Family history of adverse reaction to anesthesia    mother -- ponv   Fatigue    Fecal incontinence    Frequent falls    due to MS   GERD (gastroesophageal reflux disease)    Heart murmur    first noticed 41 yrs. 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 , low grade appendiceal mucinous neoplasm    Migraines    right side    Mitral valve prolapse    states no problem   Multiple sclerosis [facility]) 1989   neurologist-  dr sater   Myocarditis [facility]) 04/25/2019   Neurogenic bladder    Osteoporosis    Pneumonia    h/o of pneu.   PONV (postoperative  nausea and vomiting)    Pulmonary nodule, right    pulmologist-  dr Milinda Hirschfeld--  stable per ct 05-17-2016   Short of breath on exertion    Sjogren's disease ([place])    dryness of eyes, mouth   Spastic gait    Transverse myelitis (H[facility])    T4   Wears bilateral ankle braces    AFO braces for stability      Past Surgical History:  Procedure Laterality Date   ANAL RECTAL MANOMETRY N/A 08/18/2016   Procedure: ANO RECTAL MANOMETRY;  Surgeon: Leighton Ruff, MD;  Location[facility];  Service: Endoscopy;  Laterality: N/A;   ANTERIOR CERVICAL DECOMP/DISCECTOMY FUSION  2004;   2003   C4 -- C5 (2004)/   C3 -- C4 (2003)   ANTERIOR CERVICAL DECOMP/DISCECTOMY FUSION N/A 05/17/2019   Procedure: EVACUATION HEMATOMA OF POST OP ANTERIOR CERVICAL DECOMPRESSION FUSION;  Surgeon: Jovita Gamma, MD;  Location: [place];  Service: Neurosurgery;  Laterality: N/A;   ANTERIOR CERVICAL DECOMP/DISCECTOMY FUSION N/A 05/17/2019   Procedure: REMOVAL OF TETHER CERVICAL PLATE, ANTERIOR CERVICAL DECOMPRESSION/DISCECTOMY FUSION CERVICAL FIVE- CERVICAL SIX, CERVICAL SIX- CERVICAL SEVEN;  Surgeon: Jovita Gamma, MD;  Location: [place];  Service:  Neurosurgery;  Laterality: N/A;   APPENDECTOMY  06/22/2011   laparoscopic   BILATERAL SALPINGOOPHORECTOMY  1982   BLADDER SUSPENSION  1994   sling   BREAST ENHANCEMENT SURGERY Bilateral    BREAST IMPLANT REMOVAL Bilateral 12/03/2008   BUNIONECTOMY     CATARACT EXTRACTION W/ INTRAOCULAR LENS  IMPLANT, BILATERAL  2015   ELBOW SURGERY Right    HEMORROIDECTOMY  08/05/2010;  2013   INSERTION SACRAL NERVE STIMULATOR TEST WIRE  09-28-2016   dr Marcello Moores   INTERCOSTAL NERVE BLOCK  2015   occipital   MENISCUS REPAIR Left 09/26/2018   RECTAL ULTRASOUND N/A 08/18/2016   Procedure: RECTAL ULTRASOUND;  Surgeon: Leighton Ruff, MD;  [facility];  Service: Endoscopy;  Laterality: N/A;   SHOULDER ARTHROSCOPY Left    SHOULDER ARTHROSCOPY WITH DISTAL CLAVICLE RESECTION Right 09/21/2007   w/  Acrominoplasty,  Debridement Rotator Cuff,  CA ligament release   TONSILLECTOMY  age 89   TRIGGER FINGER RELEASE Left 01/16/2015   Procedure: LEFT THUMB TRIGGER RELEASE ;  Surgeon: Leanora Cover, MD;  Location: [place];  Service: Orthopedics;  Laterality: Left;   VAGINAL HYSTERECTOMY  1981      Social History:      Social History   Tobacco Use   Smoking status: Passive Smoke Exposure - Never Smoker   Smokeless tobacco: Never Used   Tobacco comment: parents, husband, & multiple family members  Substance Use Topics   Alcohol use: No    Alcohol/week: 0.0 standard drinks       Family History :     Family History  Problem Relation Age of Onset   Liver disease Father    Cirrhosis Father    Stroke Mother    Cancer Mother        oropharyngeal   Heart attack Mother    Anesthesia problems Mother        post-op nausea   Cancer Brother        twin; tonsillar?   Cancer Sister 19       breast ca, lung ca   Cancer Other        Nephew; appendiceal carcnoid, melanoma   Cancer Paternal Grandmother        cervical       Home Medications:  Prior  to Admission medications   Medication Sig Start Date End Date Taking? Authorizing Provider  Amantadine HCl 100 MG tablet Take 1 tablet (100 mg total) by mouth 3 (three) times daily. 07/23/19  Yes Sater, Nanine Means, MD  amphetamine-dextroamphetamine (ADDERALL) 15 MG tablet Take one po qAm and one po 4 hours later Patient taking differently: Take 15 mg by mouth See admin instructions. Take one po qAm and one po 4 hours later 10/16/19  Yes Sater, Nanine Means, MD  Biotin 10000 MCG TABS Take 1 tablet by mouth daily.   Yes [provider]  calcium carbonate (OS-CAL) 600 MG TABS tablet Take 1,200 mg by mouth.    Yes [provider]  carvedilol (COREG) 3.125 MG tablet Take 3.125 mg by mouth 2 (two) times daily with a meal.   Yes [provider]  Dalfampridine (4-AMINOPYRIDINE) POWD Take 5 mg by mouth 3 (three) times daily. Pharmacy dispense 270 capsules with 3 refills. Thank you 08/28/19  Yes Sater, Nanine Means, MD  denosumab (PROLIA) 60 MG/ML SOLN injection Inject 60 mg into the skin every 6 (six) months. Administer in upper arm, thigh, or abdomen   Yes [provider]  DEXILANT 60 MG capsule Take 1 capsule by mouth 2 (two) times daily.  12/01/19  Yes [provider]  donepezil (ARICEPT) 10 MG tablet Take 1 tablet (10 mg total) by mouth at bedtime. 09/17/19  Yes Sater, Nanine Means, MD  furosemide (LASIX) 20 MG tablet Take 20 mg by mouth daily.   Yes [provider]  lamoTRIgine (LAMICTAL) 150 MG tablet TAKE 1 TABLET BY MOUTH TWICE A DAY Patient taking differently: Take 150 mg by mouth 2 (two) times daily.  08/06/19  Yes Sater, Nanine Means, MD  loratadine (CLARITIN) 10 MG tablet Take 10 mg by mouth daily.   Yes [provider]  losartan (COZAAR) 25 MG tablet Take 12.5 mg by mouth daily.   Yes [provider]  Meclizine HCl 25 MG CHEW Chew 2 tablets by mouth as needed (dizziness).    Yes [provider]  Multiple Vitamins-Minerals  (MULTIVITAMIN PO) Take 1 tablet by mouth daily. Brand name - Mature Multivitamin from [employer]   Yes [provider]  potassium chloride (KLOR-CON) 10 MEQ tablet Take 10 mEq by mouth daily.   Yes [provider]  Probiotic Product ([place]) Take 1 tablet by mouth every morning.   Yes [provider]  rOPINIRole (REQUIP) 0.25 MG tablet TAKE 1 TO 2 TABLETS BY MOUTH AT NIGHT Patient taking differently: Take 0.5 mg by mouth at bedtime.  11/20/19  Yes Sater, Nanine Means, MD  Vitamin D, Ergocalciferol, (DRISDOL) 50000 UNITS CAPS capsule Take 1 capsule (50,000 Units total) by mouth every 7 (seven) days. When finished, take an otc daily vitamin d supplement of 5,000iu. Patient taking differently: Take 50,000 Units by mouth every Sunday.  02/11/15  Yes Sater, Nanine Means, MD  cephALEXin (KEFLEX) 500 MG capsule Take 2 capsules (1,000 mg total) by mouth 2 (two) times daily. Patient not taking: Reported on 12/04/2019 10/31/19   Charlesetta Shanks, MD  pantoprazole (PROTONIX) 40 MG tablet Take 1 tablet (40 mg total) by mouth every morning. Patient not taking: Reported on 12/04/2019 08/03/16   Javier Glazier, MD     Allergies:     Allergies  Allergen Reactions   Other Shortness Of Breath    PURPLE LETTUCE   Betaseron [Interferon Beta-1b] Other (See Comments)  HARD NODULAR AREAS AT INJECTION SITE   Codeine Nausea And Vomiting   Gilenya [Fingolimod Hydrochloride] Other (See Comments)    MACULAR EDEMA   Morphine And Related Other (See Comments)    IV ROUTE - RED STREAKS OF VEIN   Sumatriptan Nausea Only    INJECTABLE ONLY - RAPID HEART RATE, FLUSHING   Tysabri [Natalizumab] Other (See Comments)    DEVELOPED JCV ANTIBODY   Latex Rash     Physical Exam:   Vitals  Blood pressure 106/72, pulse 84, temperature 98.4 F (36.9 C), temperature source Oral, resp. rate (!) 21, height 5\' 10"  (1.778 m), weight 67.1 kg, SpO2 100 %.  1.  General: axoxo3  2.  Psychiatric: euthymic  3. Neurologic: cn2-12 intact, reflexes 2+ symmetric, diffuse with no clonus, motor 5/5 in all 4 ext   4. HEENMT:  Anicteric, pupils 1.69mm symmetric, direct, consensual intact Neck: no jvd  5. Respiratory : CTAB  6. Cardiovascular : rrr s1, s2, 1/6    7. Gastrointestinal:  Abd: soft, nt, nd, +bs  8. Skin:  Ext: no c/c/e, no rash  9.Musculoskeletal:  God ROM    Data Review:    CBC Recent Labs  Lab 12/04/19 1633  WBC 8.8  HGB 15.1*  HCT 45.5  PLT 292  MCV 92.9  MCH 30.8  MCHC 33.2  RDW 13.0   ------------------------------------------------------------------------------------------------------------------  Results for orders placed or performed during the hospital encounter of 12/04/19 (from the past 48 hour(s))  Lipase, blood     Status: None   Collection Time: 12/04/19  4:33 PM  Result Value Ref Range   Lipase 33 11 - 51 U/L    Comment: Performed at [facility], [place] [address], [place], [place] [address]  Comprehensive metabolic panel     Status: Abnormal   Collection Time: 12/04/19  4:33 PM  Result Value Ref Range   Sodium 140 135 - 145 mmol/L   Potassium 3.4 (L) 3.5 - 5.1 mmol/L   Chloride 103 98 - 111 mmol/L   CO2 28 22 - 32 mmol/L   Glucose, Bld 110 (H) 70 - 99 mg/dL   BUN 15 8 - 23 mg/dL   Creatinine, Ser 1.36 (H) 0.44 - 1.00 mg/dL   Calcium 8.9 8.9 - 10.3 mg/dL   Total Protein 6.9 6.5 - 8.1 g/dL   Albumin 4.0 3.5 - 5.0 g/dL   AST 16 15 - 41 U/L   ALT 16 0 - 44 U/L   Alkaline Phosphatase 84 38 - 126 U/L   Total Bilirubin 0.4 0.3 - 1.2 mg/dL   GFR calc non Af Amer 40 (L) >60 mL/min   GFR calc Af Amer 47 (L) >60 mL/min   Anion gap 9 5 - 15    Comment: Performed at [facility] [address] [place] [address]  CBC     Status: Abnormal   Collection Time: 12/04/19  4:33 PM  Result Value Ref Range   WBC 8.8 4.0 - 10.5 K/uL   RBC 4.90 3.87 - 5.11 MIL/uL   Hemoglobin 15.1 (H) 12.0 - 15.0 g/dL   HCT  45.5 36.0 - 46.0 %   MCV 92.9 80.0 - 100.0 fL   MCH 30.8 26.0 - 34.0 pg   MCHC 33.2 30.0 - 36.0 g/dL   RDW 13.0 11.5 - 15.5 %   Platelets 292 150 - 400 K/uL   nRBC 0.0 0.0 - 0.2 %    Comment: Performed at [facility], [place] [address] [place] [address]  Brain natriuretic peptide     Status: Abnormal   Collection Time: 12/04/19  7:56 PM  Result Value Ref Range   B Natriuretic Peptide 515.9 (H) 0.0 - 100.0 pg/mL    Comment: Performed at [place] [address] [place], [place] [address]  Magnesium     Status: None   Collection Time: 12/04/19  7:56 PM  Result Value Ref Range   Magnesium 1.9 1.7 - 2.4 mg/dL    Comment: Performed at [facility], [place] [address] [place], [place] [address]    Chemistries  Recent Labs  Lab 12/04/19 1633 12/04/19 1956  NA 140  --   K 3.4*  --   CL 103  --   CO2 28  --   GLUCOSE 110*  --   BUN 15  --   CREATININE 1.36*  --   CALCIUM 8.9  --   MG  --  1.9  AST 16  --   ALT 16  --   ALKPHOS 84  --   BILITOT 0.4  --    ------------------------------------------------------------------------------------------------------------------  ------------------------------------------------------------------------------------------------------------------ GFR: Estimated Creatinine Clearance: 42.5 mL/min (A) (by C-G formula based on SCr of 1.36 mg/dL (H)). Liver Function Tests: Recent Labs  Lab 12/04/19 1633  AST 16  ALT 16  ALKPHOS 84  BILITOT 0.4  PROT 6.9  ALBUMIN 4.0   Recent Labs  Lab 12/04/19 1633  LIPASE 33   No results for input(s): AMMONIA in the last 168 hours. Coagulation Profile: No results for input(s): INR, PROTIME in the last 168 hours. Cardiac Enzymes: No results for input(s): CKTOTAL, CKMB, CKMBINDEX, TROPONINI in the last 168 hours. BNP (last 3 results) No results for input(s): PROBNP in the last 8760 hours. HbA1C: No results for input(s): HGBA1C in the last 72 hours. CBG: No results  for input(s): GLUCAP in the last 168 hours. Lipid Profile: No results for input(s): CHOL, HDL, LDLCALC, TRIG, CHOLHDL, LDLDIRECT in the last 72 hours. Thyroid Function Tests: No results for input(s): TSH, T4TOTAL, FREET4, T3FREE, THYROIDAB in the last 72 hours. Anemia Panel: No results for input(s): VITAMINB12, FOLATE, FERRITIN, TIBC, IRON, RETICCTPCT in the last 72 hours.  --------------------------------------------------------------------------------------------------------------- Urine analysis:    Component Value Date/Time   COLORURINE STRAW (A) 10/31/2019 2000   APPEARANCEUR CLEAR 10/31/2019 2000   LABSPEC 1.005 10/31/2019 2000   PHURINE 6.0 10/31/2019 2000   GLUCOSEU NEGATIVE 10/31/2019 2000   HGBUR SMALL (A) 10/31/2019 2000   BILIRUBINUR NEGATIVE 10/31/2019 2000   KETONESUR NEGATIVE 10/31/2019 2000   PROTEINUR NEGATIVE 10/31/2019 2000   UROBILINOGEN 0.2 09/15/2015 1130   NITRITE NEGATIVE 10/31/2019 2000   LEUKOCYTESUR SMALL (A) 10/31/2019 2000      Imaging Results:    Dg Chest Port 1 View  Result Date: 12/04/2019 CLINICAL DATA:  Weakness and diarrhea for 4 days. Possible CHF. EXAM: PORTABLE CHEST 1 VIEW COMPARISON:  Chest radiograph 04/25/2019. CT 05/17/2016 FINDINGS: The cardiomediastinal contours are normal. Minimal biapical pleuroparenchymal scarring. Subsegmental atelectasis at the left lung base. Pulmonary vasculature is normal. No consolidation, pleural effusion, or pneumothorax. No acute osseous abnormalities are seen. IMPRESSION: Subsegmental atelectasis at the left lung base. Otherwise no acute abnormality. No evidence of CHF. Electronically Signed   By: Keith Rake M.D.   On: 12/04/2019 22:10      Assessment & Plan:    Active Problems:   Nausea   Diarrhea   Hypokalemia  Orthostatic  hypotension Check trop I  Check cortisol Hydrate gently with ns iv Consider compression stockings Consider florinef Check orthostatic bp in  am  Hypokalemia Replete Check cmp in am  Nausea ? Gastritis Zofran 4mg  iv q6h prn  Cont PPI  Diarrhea Doubt C. Diff, but if persistent please check stool studies Consider GI consultation  Tak[place]ubo Cardiomyopathy Cont Carvedilol 3.125mg  po bid Cont Lasix 20mg  po qday Cont Kcl 108meq po qday Cont Losartan 25mg  po qday  Multiple sclerosis Cont Amantadine Cont Dalfampridine  ADD Cont Adderall Cont Lamictal  Mild Cognitive Impairment Cont Aricept   RLS Cont Requip    DVT Prophylaxis-   Lovenox - SCDs   AM Labs Ordered, also please review Full Orders  Family Communication: Admission, patients condition and plan of care including tests being ordered have been discussed with the patient  who indicate understanding and agree with the plan and Code Status.  Code Status:  FULL CODE per patient  Admission status: Observation: Based on patients clinical presentation and evaluation of above clinical data, I have made determination that patient meets Observation criteria at this time.    Time spent in minutes : 70 minutes   Jani Gravel M.D on 12/04/2019 at 11:31 PM

## 2019-12-04 NOTE — ED Provider Notes (Signed)
[facility] EMERGENCY DEPARTMENT Provider Note   CSN: [id number] Arrival date & time: 12/04/19  1617     History   Chief Complaint Chief Complaint  Patient presents with  . Diarrhea    HPI [name] is a 68 y.o. female.     HPI Patient presents concern of ongoing diarrhea, weakness.  Patient notes that she has a history of C. difficile colitis, sometime ago. However, since that time she has had essentially daily diarrhea. She notes that she has what sounds like rapid transit diarrhea, after eating, and subsequently has had several additional episodes, that slow with no oral intake. There is associated nausea, but no vomiting. She has associated weakness, dyspnea, worse with activity, upright positioning and exertion. She has required ED evaluation in the past for IV fluids. Notably, the patient also had endoscopy and colonoscopy last week, with reportedly normal findings.  She has no known history of IBS, IBD.  Today, with worsening generalized weakness, inability to perform ADLs secondary to fatigue, after speaking with her physician, and reporting that she was hypotensive she was sent here for evaluation.   Past Medical History:  Diagnosis Date  . Arthritis    back  . Asthma    20 yrs., ago one episode  . Dysesthesia    bilateral feet  . Dyspnea   . Family history of adverse reaction to anesthesia    mother -- ponv  . Fatigue   . Fecal incontinence   . Frequent falls    due to MS  . GERD (gastroesophageal reflux disease)   . Heart murmur    first noticed 41 yrs. when pregnant, can't hear it now  . History of colon polyps    2013;  2015  . History of hiatal hernia   . 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 Migraines    right side   . Mitral valve prolapse    states no problem  . Multiple sclerosis [facility]) 1989   neurologist-  dr Felecia Shelling  . Myocarditis ([place])  04/25/2019  . Neurogenic bladder   . Osteoporosis   . Pneumonia    h/o of pneu.  Marland Kitchen PONV (postoperative nausea and vomiting)   . Pulmonary nodule, right    pulmologist-  dr Milinda Hirschfeld--  stable per ct 05-17-2016  . Short of breath on exertion   . Sjogren's disease ([place])    dryness of eyes, mouth  . Spastic gait   . Transverse myelitis ([place])    T4  . Wears bilateral ankle braces    AFO braces for stability    Patient Active Problem List   Diagnosis Date Noted  . HNP (herniated nucleus pulposus), cervical 05/17/2019  . Memory loss 03/14/2019  . Excessive daytime sleepiness 10/17/2017  . Vitamin D deficiency 12/09/2016  . Attention deficit disorder 12/09/2016  . Sjogren's disease ([place]) 05/03/2016  . Other headache syndrome 09/08/2015  . Transverse myelitis ([place]) 05/06/2015  . Dysesthesia 05/06/2015  . Neck pain 05/06/2015  . Occipital neuralgia 05/06/2015  . Spastic gait 02/05/2015  . Urinary frequency 02/05/2015  . Other fatigue 02/05/2015  . Multiple lung nodules on CT 09/24/2013  . Nipple discharge 08/04/2012  . 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 VOCAL CORD DISORDER 10/21/2009  . DYSPHAGIA, OROPHARYNGEAL PHASE 10/21/2009  . PULMONARY NODULE, RIGHT UPPER LOBE 01/22/2009  . DYSPNEA 09/24/2008  . BRONCHITIS, OBSTRUCTIVE CHRONIC, WITH  EXACERBATION 09/10/2008  . G E R D 09/10/2008  . MULTIPLE SCLEROSIS 03/13/2008  . MIGRAINE, CHRONIC 03/13/2008  . CHRONIC RHINITIS 03/13/2008  . COUGH 03/13/2008    Past Surgical History:  Procedure Laterality Date  . ANAL RECTAL MANOMETRY N/A 08/18/2016   Procedure: ANO RECTAL MANOMETRY;  Surgeon: Leighton Ruff, MD;  Location: [facility];  Service: Endoscopy;  Laterality: N/A;  . ANTERIOR CERVICAL DECOMP/DISCECTOMY FUSION  2004;   2003   C4 -- C5 (2004)/   C3 -- C4 (2003)  . ANTERIOR CERVICAL DECOMP/DISCECTOMY FUSION N/A 05/17/2019   Procedure: EVACUATION HEMATOMA OF POST OP ANTERIOR CERVICAL DECOMPRESSION FUSION;  Surgeon: Jovita Gamma, MD;  Location: [place];  Service: Neurosurgery;  Laterality: N/A;  . ANTERIOR CERVICAL DECOMP/DISCECTOMY FUSION N/A 05/17/2019   Procedure: REMOVAL OF TETHER CERVICAL PLATE, ANTERIOR CERVICAL DECOMPRESSION/DISCECTOMY FUSION CERVICAL FIVE- CERVICAL SIX, CERVICAL SIX- CERVICAL SEVEN;  Surgeon: Jovita Gamma, MD;  Location: [place];  Service: Neurosurgery;  Laterality: N/A;  . APPENDECTOMY  06/22/2011   laparoscopic  . BILATERAL SALPINGOOPHORECTOMY  1982  . BLADDER SUSPENSION  1994   sling  . BREAST ENHANCEMENT SURGERY Bilateral   . BREAST IMPLANT REMOVAL Bilateral 12/03/2008  . BUNIONECTOMY    . CATARACT EXTRACTION W/ INTRAOCULAR LENS  IMPLANT, BILATERAL  2015  . ELBOW SURGERY Right   . HEMORROIDECTOMY  08/05/2010;  2013  . INSERTION SACRAL NERVE STIMULATOR TEST WIRE  09-28-2016   dr Marcello Moores  . INTERCOSTAL NERVE BLOCK  2015   occipital  . MENISCUS REPAIR Left 09/26/2018  . RECTAL ULTRASOUND N/A 08/18/2016   Procedure: RECTAL ULTRASOUND;  Surgeon: Leighton Ruff, MD;  Location: [facility];  Service: Endoscopy;  Laterality: N/A;  . SHOULDER ARTHROSCOPY Left   . SHOULDER ARTHROSCOPY WITH DISTAL CLAVICLE RESECTION Right 09/21/2007   w/  Acrominoplasty,  Debridement Rotator Cuff,  CA ligament release  . TONSILLECTOMY  age 63  . TRIGGER FINGER RELEASE Left 01/16/2015   Procedure: LEFT THUMB TRIGGER RELEASE ;  Surgeon: Leanora Cover, MD;  Location: [place];  Service: Orthopedics;  Laterality: Left;  Marland Kitchen VAGINAL HYSTERECTOMY  1981     OB History   No obstetric history on file.      Home Medications    Prior to Admission medications   Medication Sig Start Date End Date Taking? Authorizing Provider  Amantadine HCl 100 MG tablet Take 1 tablet (100 mg total) by mouth 3 (three) times daily. 07/23/19  Yes Sater, Nanine Means, MD  amphetamine-dextroamphetamine (ADDERALL) 15 MG tablet Take one po qAm and one po 4 hours later Patient taking differently: Take 15 mg by mouth See  admin instructions. Take one po qAm and one po 4 hours later 10/16/19  Yes Sater, Nanine Means, MD  Biotin 10000 MCG TABS Take 1 tablet by mouth daily.   Yes [provider]  calcium carbonate (OS-CAL) 600 MG TABS tablet Take 1,200 mg by mouth.    Yes [provider]  carvedilol (COREG) 3.125 MG tablet Take 3.125 mg by mouth 2 (two) times daily with a meal.   Yes [provider]  Dalfampridine (4-AMINOPYRIDINE) POWD Take 5 mg by mouth 3 (three) times daily. Pharmacy dispense 270 capsules with 3 refills. Thank you 08/28/19  Yes Sater, Nanine Means, MD  denosumab (PROLIA) 60 MG/ML SOLN injection Inject 60 mg into the skin every 6 (six) months. Administer in upper arm, thigh, or abdomen   Yes [provider]  DEXILANT 60 MG capsule Take 1 capsule  by mouth 2 (two) times daily.  12/01/19  Yes [provider]  donepezil (ARICEPT) 10 MG tablet Take 1 tablet (10 mg total) by mouth at bedtime. 09/17/19  Yes Sater, Nanine Means, MD  furosemide (LASIX) 20 MG tablet Take 20 mg by mouth daily.   Yes [provider]  lamoTRIgine (LAMICTAL) 150 MG tablet TAKE 1 TABLET BY MOUTH TWICE A DAY Patient taking differently: Take 150 mg by mouth 2 (two) times daily.  08/06/19  Yes Sater, Nanine Means, MD  loratadine (CLARITIN) 10 MG tablet Take 10 mg by mouth daily.   Yes [provider]  losartan (COZAAR) 25 MG tablet Take 12.5 mg by mouth daily.   Yes [provider]  Meclizine HCl 25 MG CHEW Chew 2 tablets by mouth as needed (dizziness).    Yes [provider]  Multiple Vitamins-Minerals (MULTIVITAMIN PO) Take 1 tablet by mouth daily. Brand name - Mature Multivitamin from [employer]   Yes [provider]  potassium chloride (KLOR-CON) 10 MEQ tablet Take 10 mEq by mouth daily.   Yes [provider]  Probiotic Product ([place]) Take 1 tablet by mouth every morning.   Yes [provider]  rOPINIRole (REQUIP) 0.25 MG  tablet TAKE 1 TO 2 TABLETS BY MOUTH AT NIGHT Patient taking differently: Take 0.5 mg by mouth at bedtime.  11/20/19  Yes Sater, Nanine Means, MD  Vitamin D, Ergocalciferol, (DRISDOL) 50000 UNITS CAPS capsule Take 1 capsule (50,000 Units total) by mouth every 7 (seven) days. When finished, take an otc daily vitamin d supplement of 5,000iu. Patient taking differently: Take 50,000 Units by mouth every Sunday.  02/11/15  Yes Sater, Nanine Means, MD  cephALEXin (KEFLEX) 500 MG capsule Take 2 capsules (1,000 mg total) by mouth 2 (two) times daily. Patient not taking: Reported on 12/04/2019 10/31/19   Charlesetta Shanks, MD  pantoprazole (PROTONIX) 40 MG tablet Take 1 tablet (40 mg total) by mouth every morning. Patient not taking: Reported on 12/04/2019 08/03/16   Javier Glazier, MD    Family History Family History  Problem Relation Age of Onset  . Liver disease Father   . Cirrhosis Father   . Stroke Mother   . Cancer Mother        oropharyngeal  . Heart attack Mother   . Anesthesia problems Mother        post-op nausea  . Cancer Brother        twin; tonsillar?  . Cancer Sister 10       breast ca, lung ca  . Cancer Other        Nephew; appendiceal carcnoid, melanoma  . Cancer Paternal Grandmother        cervical    Social History Social History   Tobacco Use  . Smoking status: Passive Smoke Exposure - Never Smoker  . Smokeless tobacco: Never Used  . Tobacco comment: parents, husband, & multiple family members  Substance Use Topics  . Alcohol use: No    Alcohol/week: 0.0 standard drinks  . Drug use: No     Allergies   Other, Betaseron [interferon beta-1b], Codeine, Gilenya [fingolimod hydrochloride], Morphine and related, Sumatriptan, Tysabri [natalizumab], and Latex   Review of Systems Review of Systems  Constitutional:       Per HPI, otherwise negative  HENT:       Per HPI, otherwise negative  Respiratory:       Per HPI, otherwise negative  Cardiovascular:  History of  Takotsubo's cardiopathy  Gastrointestinal: Positive for diarrhea and nausea. Negative for abdominal pain and vomiting.  Endocrine:       Negative aside from HPI  Genitourinary:       Neg aside from HPI   Musculoskeletal:       Per HPI, otherwise negative  Skin: Negative.   Neurological: Positive for weakness and light-headedness. Negative for syncope.     Physical Exam Updated Vital Signs BP 106/72   Pulse 84   Temp 98.4 F (36.9 C) (Oral)   Resp (!) 21   Ht 5\' 10"  (1.778 m)   Wt 67.1 kg   SpO2 100%   BMI 21.24 kg/m   Physical Exam Vitals signs and nursing note reviewed.  Constitutional:      General: She is not in acute distress.    Appearance: She is well-developed.  HENT:     Head: Normocephalic and atraumatic.  Eyes:     Conjunctiva/sclera: Conjunctivae normal.  Cardiovascular:     Rate and Rhythm: Normal rate and regular rhythm.     Pulses: Normal pulses.  Pulmonary:     Effort: Pulmonary effort is normal. No respiratory distress.     Breath sounds: Normal breath sounds. No stridor.  Abdominal:     General: There is no distension.  Skin:    General: Skin is warm and dry.  Neurological:     Mental Status: She is alert and oriented to person, place, and time.     Cranial Nerves: No cranial nerve deficit.      ED Treatments / Results  Labs (all labs ordered are listed, but only abnormal results are displayed) Labs Reviewed  COMPREHENSIVE METABOLIC PANEL - Abnormal; Notable for the following components:      Result Value   Potassium 3.4 (*)    Glucose, Bld 110 (*)    Creatinine, Ser 1.36 (*)    GFR calc non Af Amer 40 (*)    GFR calc Af Amer 47 (*)    All other components within normal limits  CBC - Abnormal; Notable for the following components:   Hemoglobin 15.1 (*)    All other components within normal limits  BRAIN NATRIURETIC PEPTIDE - Abnormal; Notable for the following components:   B Natriuretic Peptide 515.9 (*)    All other components  within normal limits  LIPASE, BLOOD  MAGNESIUM  URINALYSIS, ROUTINE W REFLEX MICROSCOPIC  POC SARS CORONAVIRUS 2 AG -  ED    EKG EKG Interpretation  Date/Time:  Tuesday December 04 2019 20:05:16 EST Ventricular Rate:  83 PR Interval:    QRS Duration: 66 QT Interval:  404 QTC Calculation: 475 R Axis:   74 Text Interpretation: Sinus rhythm Probable anteroseptal infarct, old ST-t wave abnormality Abnormal ECG Confirmed by Carmin Muskrat [id number]) on 12/04/2019 9:27:20 PM   Radiology Dg Chest Port 1 View  Result Date: 12/04/2019 CLINICAL DATA:  Weakness and diarrhea for 4 days. Possible CHF. EXAM: PORTABLE CHEST 1 VIEW COMPARISON:  Chest radiograph 04/25/2019. CT 05/17/2016 FINDINGS: The cardiomediastinal contours are normal. Minimal biapical pleuroparenchymal scarring. Subsegmental atelectasis at the left lung base. Pulmonary vasculature is normal. No consolidation, pleural effusion, or pneumothorax. No acute osseous abnormalities are seen. IMPRESSION: Subsegmental atelectasis at the left lung base. Otherwise no acute abnormality. No evidence of CHF. Electronically Signed   By: Keith Rake M.D.   On: 12/04/2019 22:10    Procedures Procedures (including critical care time)  Medications Ordered in ED Medications  sodium  chloride flush (NS) 0.9 % injection 3 mL (3 mLs Intravenous Given 12/04/19 2031)  sodium chloride 0.9 % bolus 1,000 mL (1,000 mLs Intravenous Bolus from Bag 12/04/19 2031)     Initial Impression / Assessment and Plan / ED Course  I have reviewed the triage vital signs and the nursing notes.  Pertinent labs & imaging results that were available during my care of the patient were reviewed by me and considered in my medical decision making (see chart for details).        10:51 PM Patient awake and alert, remains hypotensive in spite of fluid resuscitation. Labs reviewed with her and her husband is now accompanying her. Patient found to have worsening renal  function, elevated BNP X-ray also consistent with cardiomegaly, and given the patient's history of Takotsubo's, there is suspicion for the patient's cardiomyopathy, contributing to her hypotension, weakness, and commendation with dehydration/worsening renal function. Patient's abdomen is soft, nontender, she has no fever, no indication for abdominal imaging, but given persistent symptoms, hypotension she will require admission for further monitoring, management, consideration of a.m. studies.  Covid test pending on admission.  Final Clinical Impressions(s) / ED Diagnoses   Final diagnoses:  Dehydration  Diarrhea, unspecified type     Carmin Muskrat, MD 12/04/19 2315

## 2019-12-04 NOTE — ED Triage Notes (Signed)
Pt was sent to ED by PCP due to low blood pressure. Pt states she is dehydrated and was seen at [facility] 3 weeks ago for the same. Pt sates she feels very weak, with no energy. Pt denies N/V, endorses diarrhea. Pt is ambulatory in triage. Triage BP is 114/77 with HR 110.

## 2019-12-05 DIAGNOSIS — R197 Diarrhea, unspecified: Secondary | ICD-10-CM | POA: Diagnosis not present

## 2019-12-05 DIAGNOSIS — G2581 Restless legs syndrome: Secondary | ICD-10-CM | POA: Diagnosis present

## 2019-12-05 DIAGNOSIS — R109 Unspecified abdominal pain: Secondary | ICD-10-CM | POA: Diagnosis present

## 2019-12-05 DIAGNOSIS — Z8719 Personal history of other diseases of the digestive system: Secondary | ICD-10-CM | POA: Diagnosis not present

## 2019-12-05 DIAGNOSIS — Z885 Allergy status to narcotic agent status: Secondary | ICD-10-CM | POA: Diagnosis not present

## 2019-12-05 DIAGNOSIS — K52831 Collagenous colitis: Secondary | ICD-10-CM | POA: Diagnosis present

## 2019-12-05 DIAGNOSIS — M810 Age-related osteoporosis without current pathological fracture: Secondary | ICD-10-CM | POA: Diagnosis present

## 2019-12-05 DIAGNOSIS — Z20828 Contact with and (suspected) exposure to other viral communicable diseases: Secondary | ICD-10-CM | POA: Diagnosis present

## 2019-12-05 DIAGNOSIS — I5181 Takotsubo syndrome: Secondary | ICD-10-CM | POA: Diagnosis present

## 2019-12-05 DIAGNOSIS — Z7722 Contact with and (suspected) exposure to environmental tobacco smoke (acute) (chronic): Secondary | ICD-10-CM | POA: Diagnosis present

## 2019-12-05 DIAGNOSIS — G3184 Mild cognitive impairment, so stated: Secondary | ICD-10-CM | POA: Diagnosis present

## 2019-12-05 DIAGNOSIS — E876 Hypokalemia: Secondary | ICD-10-CM | POA: Diagnosis present

## 2019-12-05 DIAGNOSIS — R7989 Other specified abnormal findings of blood chemistry: Secondary | ICD-10-CM | POA: Diagnosis present

## 2019-12-05 DIAGNOSIS — K219 Gastro-esophageal reflux disease without esophagitis: Secondary | ICD-10-CM | POA: Diagnosis present

## 2019-12-05 DIAGNOSIS — I951 Orthostatic hypotension: Secondary | ICD-10-CM | POA: Diagnosis present

## 2019-12-05 DIAGNOSIS — E860 Dehydration: Secondary | ICD-10-CM | POA: Diagnosis present

## 2019-12-05 DIAGNOSIS — N179 Acute kidney failure, unspecified: Secondary | ICD-10-CM | POA: Diagnosis present

## 2019-12-05 DIAGNOSIS — Z888 Allergy status to other drugs, medicaments and biological substances status: Secondary | ICD-10-CM | POA: Diagnosis not present

## 2019-12-05 DIAGNOSIS — F988 Other specified behavioral and emotional disorders with onset usually occurring in childhood and adolescence: Secondary | ICD-10-CM | POA: Diagnosis present

## 2019-12-05 DIAGNOSIS — G35 Multiple sclerosis: Secondary | ICD-10-CM | POA: Diagnosis present

## 2019-12-05 DIAGNOSIS — K449 Diaphragmatic hernia without obstruction or gangrene: Secondary | ICD-10-CM | POA: Diagnosis present

## 2019-12-05 DIAGNOSIS — Z981 Arthrodesis status: Secondary | ICD-10-CM | POA: Diagnosis not present

## 2019-12-05 DIAGNOSIS — Z91040 Latex allergy status: Secondary | ICD-10-CM | POA: Diagnosis not present

## 2019-12-05 DIAGNOSIS — M3500 Sicca syndrome, unspecified: Secondary | ICD-10-CM | POA: Diagnosis present

## 2019-12-05 DIAGNOSIS — G8929 Other chronic pain: Secondary | ICD-10-CM | POA: Diagnosis present

## 2019-12-05 LAB — CBC
HCT: 40 % (ref 36.0–46.0)
Hemoglobin: 12.9 g/dL (ref 12.0–15.0)
MCH: 30.7 pg (ref 26.0–34.0)
MCHC: 32.3 g/dL (ref 30.0–36.0)
MCV: 95.2 fL (ref 80.0–100.0)
Platelets: 226 10*3/uL (ref 150–400)
RBC: 4.2 MIL/uL (ref 3.87–5.11)
RDW: 13.1 % (ref 11.5–15.5)
WBC: 7 10*3/uL (ref 4.0–10.5)
nRBC: 0 % (ref 0.0–0.2)

## 2019-12-05 LAB — COMPREHENSIVE METABOLIC PANEL
ALT: 13 U/L (ref 0–44)
AST: 13 U/L — ABNORMAL LOW (ref 15–41)
Albumin: 3.1 g/dL — ABNORMAL LOW (ref 3.5–5.0)
Alkaline Phosphatase: 67 U/L (ref 38–126)
Anion gap: 8 (ref 5–15)
BUN: 13 mg/dL (ref 8–23)
CO2: 24 mmol/L (ref 22–32)
Calcium: 7.6 mg/dL — ABNORMAL LOW (ref 8.9–10.3)
Chloride: 111 mmol/L (ref 98–111)
Creatinine, Ser: 0.91 mg/dL (ref 0.44–1.00)
GFR calc Af Amer: >60 mL/min (ref >60)
GFR calc non Af Amer: >60 mL/min (ref >60)
Glucose, Bld: 83 mg/dL (ref 70–99)
Potassium: 3.1 mmol/L — ABNORMAL LOW (ref 3.5–5.1)
Sodium: 143 mmol/L (ref 135–145)
Total Bilirubin: 0.2 mg/dL — ABNORMAL LOW (ref 0.3–1.2)
Total Protein: 5.3 g/dL — ABNORMAL LOW (ref 6.5–8.1)

## 2019-12-05 LAB — URINALYSIS, ROUTINE W REFLEX MICROSCOPIC
Bacteria, UA: NONE SEEN
Bilirubin Urine: NEGATIVE
Glucose, UA: NEGATIVE mg/dL
Ketones, ur: NEGATIVE mg/dL
Nitrite: NEGATIVE
Protein, ur: NEGATIVE mg/dL
Specific Gravity, Urine: 1.016 (ref 1.005–1.030)
pH: 6 (ref 5.0–8.0)

## 2019-12-05 LAB — C DIFFICILE QUICK SCREEN W PCR REFLEX
C Diff antigen: NEGATIVE
C Diff interpretation: NOT DETECTED
C Diff toxin: NEGATIVE

## 2019-12-05 LAB — SARS CORONAVIRUS 2 (TAT 6-24 HRS): SARS Coronavirus 2: NEGATIVE

## 2019-12-05 LAB — CORTISOL: Cortisol, Plasma: 13.6 ug/dL

## 2019-12-05 LAB — HIV ANTIBODY (ROUTINE TESTING W REFLEX): HIV Screen 4th Generation wRfx: NONREACTIVE

## 2019-12-05 MED ORDER — CARVEDILOL 3.125 MG PO TABS
3.1250 mg | ORAL_TABLET | Freq: Two times a day (BID) | ORAL | Status: DC
  Administered 2019-12-05 – 2019-12-06 (×3): 3.125 mg via ORAL
  Filled 2019-12-05 (×4): qty 1

## 2019-12-05 MED ORDER — POTASSIUM CHLORIDE CRYS ER 20 MEQ PO TBCR
40.0000 meq | EXTENDED_RELEASE_TABLET | Freq: Once | ORAL | Status: AC
  Administered 2019-12-05: 40 meq via ORAL
  Filled 2019-12-05: qty 2

## 2019-12-05 MED ORDER — LOSARTAN POTASSIUM 25 MG PO TABS
12.5000 mg | ORAL_TABLET | Freq: Every day | ORAL | Status: DC
  Administered 2019-12-05: 12.5 mg via ORAL
  Filled 2019-12-05: qty 0.5

## 2019-12-05 MED ORDER — LORATADINE 10 MG PO TABS
10.0000 mg | ORAL_TABLET | Freq: Every day | ORAL | Status: DC
  Administered 2019-12-05 – 2019-12-07 (×3): 10 mg via ORAL
  Filled 2019-12-05 (×3): qty 1

## 2019-12-05 MED ORDER — PANTOPRAZOLE SODIUM 40 MG PO TBEC
40.0000 mg | DELAYED_RELEASE_TABLET | Freq: Every day | ORAL | Status: DC
  Administered 2019-12-05 – 2019-12-06 (×2): 40 mg via ORAL
  Filled 2019-12-05 (×3): qty 1

## 2019-12-05 MED ORDER — DONEPEZIL HCL 10 MG PO TABS
10.0000 mg | ORAL_TABLET | Freq: Every day | ORAL | Status: DC
  Administered 2019-12-05 – 2019-12-06 (×3): 10 mg via ORAL
  Filled 2019-12-05 (×4): qty 1
  Filled 2019-12-05: qty 2

## 2019-12-05 MED ORDER — ONDANSETRON HCL 4 MG/2ML IJ SOLN
4.0000 mg | Freq: Four times a day (QID) | INTRAMUSCULAR | Status: DC | PRN
  Administered 2019-12-05 – 2019-12-06 (×2): 4 mg via INTRAVENOUS
  Filled 2019-12-05: qty 2

## 2019-12-05 MED ORDER — POTASSIUM CHLORIDE 10 MEQ/100ML IV SOLN
10.0000 meq | INTRAVENOUS | Status: AC
  Administered 2019-12-05 (×2): 10 meq via INTRAVENOUS
  Filled 2019-12-05 (×2): qty 100

## 2019-12-05 MED ORDER — ONDANSETRON HCL 4 MG/2ML IJ SOLN
INTRAMUSCULAR | Status: AC
  Filled 2019-12-05: qty 2

## 2019-12-05 MED ORDER — AMPHETAMINE-DEXTROAMPHETAMINE 5 MG PO TABS
15.0000 mg | ORAL_TABLET | Freq: Once | ORAL | Status: AC
  Administered 2019-12-05: 15 mg via ORAL
  Filled 2019-12-05: qty 3

## 2019-12-05 MED ORDER — LAMOTRIGINE 150 MG PO TABS
150.0000 mg | ORAL_TABLET | Freq: Two times a day (BID) | ORAL | Status: DC
  Administered 2019-12-05 – 2019-12-07 (×6): 150 mg via ORAL
  Filled 2019-12-05 (×8): qty 1

## 2019-12-05 MED ORDER — ROPINIROLE HCL 1 MG PO TABS
0.5000 mg | ORAL_TABLET | Freq: Every day | ORAL | Status: DC
  Administered 2019-12-05 – 2019-12-06 (×3): 0.5 mg via ORAL
  Filled 2019-12-05 (×4): qty 1

## 2019-12-05 MED ORDER — AMANTADINE HCL 100 MG PO CAPS
100.0000 mg | ORAL_CAPSULE | Freq: Three times a day (TID) | ORAL | Status: DC
  Administered 2019-12-05 – 2019-12-07 (×8): 100 mg via ORAL
  Filled 2019-12-05 (×11): qty 1

## 2019-12-05 MED ORDER — POTASSIUM CHLORIDE IN NACL 20-0.9 MEQ/L-% IV SOLN
INTRAVENOUS | Status: AC
  Administered 2019-12-05 – 2019-12-06 (×2): via INTRAVENOUS
  Filled 2019-12-05 (×3): qty 1000

## 2019-12-05 MED ORDER — POTASSIUM CHLORIDE CRYS ER 20 MEQ PO TBCR: 10.0000 meq | EXTENDED_RELEASE_TABLET | Freq: Every day | ORAL | Status: DC

## 2019-12-05 MED ORDER — FUROSEMIDE 20 MG PO TABS: 20.0000 mg | ORAL_TABLET | Freq: Every day | ORAL | Status: DC

## 2019-12-05 MED ORDER — AMPHETAMINE-DEXTROAMPHETAMINE 5 MG PO TABS
15.0000 mg | ORAL_TABLET | Freq: Two times a day (BID) | ORAL | Status: DC
  Administered 2019-12-06 – 2019-12-07 (×3): 15 mg via ORAL
  Filled 2019-12-05 (×3): qty 3

## 2019-12-05 MED ORDER — BIOTIN 10000 MCG PO TABS: 1.0000 | ORAL_TABLET | Freq: Every day | ORAL | Status: DC

## 2019-12-05 MED ORDER — CALCIUM CARBONATE 1250 (500 CA) MG PO TABS
1250.0000 mg | ORAL_TABLET | Freq: Two times a day (BID) | ORAL | Status: DC
  Administered 2019-12-06 – 2019-12-07 (×3): 1250 mg via ORAL
  Filled 2019-12-05 (×5): qty 1

## 2019-12-05 MED ORDER — ADULT MULTIVITAMIN W/MINERALS CH
1.0000 | ORAL_TABLET | Freq: Every day | ORAL | Status: DC
  Administered 2019-12-05 – 2019-12-07 (×3): 1 via ORAL
  Filled 2019-12-05 (×3): qty 1

## 2019-12-05 MED ORDER — 4-AMINOPYRIDINE POWD
5.0000 mg | Freq: Three times a day (TID) | Status: DC
  Administered 2019-12-05 – 2019-12-07 (×4): 5 mg via ORAL
  Filled 2019-12-05 (×4): qty 1

## 2019-12-05 NOTE — Consult Note (Signed)
Referring Provider: [place] Primary Care Physician:  Crist Infante, MD Primary Gastroenterologist:  Dr. Watt Climes  Reason for Consultation: Diarrhea  HPI: [name] is a 68 y.o. female with past medical history of reported history of C. difficile diagnosed by PCP, treated with vancomycin several weeks ago presented to the hospital with worsening diarrhea.  Patient is a poor historian.  Initially she told me that she is having worsening diarrhea since 3 weeks then she told me that she was diagnosed with C. difficile 8 weeks ago which was treated with vancomycin then she told me that she has been having intermittent diarrhea since 2017.  Currently complaining of 5-7 bowel movements per day.  Bowel movements are loose in consistency and not watery.  Occasional streaks of fresh blood.  Complaining of abdominal cramps prior to bowel movement.  Complaining of acid reflux and nausea.  Denies any vomiting.  Had normal EGD and colonoscopy earlier in December by Dr. Watt Climes   Past Medical History:  Diagnosis Date  . Arthritis    back  . Asthma    20 yrs., ago one episode  . Dysesthesia    bilateral feet  . Dyspnea   . Family history of adverse reaction to anesthesia    mother -- ponv  . Fatigue   . Fecal incontinence   . Frequent falls    due to MS  . GERD (gastroesophageal reflux disease)   . Heart murmur    first noticed 41 yrs. when pregnant, can't hear it now  . History of colon polyps    2013;  2015  . History of hiatal hernia   . 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 Migraines    right side   . Mitral valve prolapse    states no problem  . Multiple sclerosis [facility]) 1989   neurologist-  dr Felecia Shelling  . Myocarditis ([place]) 04/25/2019  . Neurogenic bladder   . Osteoporosis   . Pneumonia    h/o of pneu.  Marland Kitchen PONV (postoperative nausea and vomiting)   . Pulmonary nodule, right    pulmologist-  dr Milinda Hirschfeld--  stable  per ct 05-17-2016  . Short of breath on exertion   . Sjogren's disease ([place])    dryness of eyes, mouth  . Spastic gait   . 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 Wears bilateral ankle braces    AFO braces for stability    Past Surgical History:  Procedure Laterality Date  . ANAL RECTAL MANOMETRY N/A 08/18/2016   Procedure: ANO RECTAL MANOMETRY;  Surgeon: Leighton Ruff, MD;  Location: [facility];  Service: Endoscopy;  Laterality: N/A;  . ANTERIOR CERVICAL DECOMP/DISCECTOMY FUSION  2004;   2003   C4 -- C5 (2004)/   C3 -- C4 (2003)  . ANTERIOR CERVICAL DECOMP/DISCECTOMY FUSION N/A 05/17/2019   Procedure: EVACUATION HEMATOMA OF POST OP ANTERIOR CERVICAL DECOMPRESSION FUSION;  Surgeon: Jovita Gamma, MD;  Location: [place];  Service: Neurosurgery;  Laterality: N/A;  . ANTERIOR CERVICAL DECOMP/DISCECTOMY FUSION N/A 05/17/2019   Procedure: REMOVAL OF TETHER CERVICAL PLATE, ANTERIOR CERVICAL DECOMPRESSION/DISCECTOMY FUSION CERVICAL FIVE- CERVICAL SIX, CERVICAL SIX- CERVICAL SEVEN;  Surgeon: Jovita Gamma, MD;  Location: [place];  Service: Neurosurgery;  Laterality: N/A;  . APPENDECTOMY  06/22/2011   laparoscopic  . BILATERAL SALPINGOOPHORECTOMY  1982  . BLADDER SUSPENSION  1994   sling  . BREAST ENHANCEMENT SURGERY Bilateral   .  BREAST IMPLANT REMOVAL Bilateral 12/03/2008  . BUNIONECTOMY    . CATARACT EXTRACTION W/ INTRAOCULAR LENS  IMPLANT, BILATERAL  2015  . ELBOW SURGERY Right   . HEMORROIDECTOMY  08/05/2010;  2013  . INSERTION SACRAL NERVE STIMULATOR TEST WIRE  09-28-2016   dr Marcello Moores  . INTERCOSTAL NERVE BLOCK  2015   occipital  . MENISCUS REPAIR Left 09/26/2018  . RECTAL ULTRASOUND N/A 08/18/2016   Procedure: RECTAL ULTRASOUND;  Surgeon: Leighton Ruff, MD;  [facility];  Service: Endoscopy;  Laterality: N/A;  . SHOULDER ARTHROSCOPY Left   . SHOULDER ARTHROSCOPY WITH DISTAL CLAVICLE RESECTION Right 09/21/2007   w/  Acrominoplasty,  Debridement Rotator Cuff,  CA ligament  release  . TONSILLECTOMY  age 21  . TRIGGER FINGER RELEASE Left 01/16/2015   Procedure: LEFT THUMB TRIGGER RELEASE ;  Surgeon: Leanora Cover, MD;  Location: [place];  Service: Orthopedics;  Laterality: Left;  Marland Kitchen VAGINAL HYSTERECTOMY  1981    Prior to Admission medications   Medication Sig Start Date End Date Taking? Authorizing Provider  Amantadine HCl 100 MG tablet Take 1 tablet (100 mg total) by mouth 3 (three) times daily. 07/23/19  Yes Sater, Nanine Means, MD  amphetamine-dextroamphetamine (ADDERALL) 15 MG tablet Take one po qAm and one po 4 hours later Patient taking differently: Take 15 mg by mouth See admin instructions. Take one po qAm and one po 4 hours later 10/16/19  Yes Sater, Nanine Means, MD  Biotin 10000 MCG TABS Take 1 tablet by mouth daily.   Yes [provider]  calcium carbonate (OS-CAL) 600 MG TABS tablet Take 1,200 mg by mouth.    Yes [provider]  carvedilol (COREG) 3.125 MG tablet Take 3.125 mg by mouth 2 (two) times daily with a meal.   Yes [provider]  Dalfampridine (4-AMINOPYRIDINE) POWD Take 5 mg by mouth 3 (three) times daily. Pharmacy dispense 270 capsules with 3 refills. Thank you 08/28/19  Yes Sater, Nanine Means, MD  denosumab (PROLIA) 60 MG/ML SOLN injection Inject 60 mg into the skin every 6 (six) months. Administer in upper arm, thigh, or abdomen   Yes [provider]  DEXILANT 60 MG capsule Take 1 capsule by mouth 2 (two) times daily.  12/01/19  Yes [provider]  donepezil (ARICEPT) 10 MG tablet Take 1 tablet (10 mg total) by mouth at bedtime. 09/17/19  Yes Sater, Nanine Means, MD  furosemide (LASIX) 20 MG tablet Take 20 mg by mouth daily.   Yes [provider]  lamoTRIgine (LAMICTAL) 150 MG tablet TAKE 1 TABLET BY MOUTH TWICE A DAY Patient taking differently: Take 150 mg by mouth 2 (two) times daily.  08/06/19  Yes Sater, Nanine Means, MD  loratadine (CLARITIN) 10 MG tablet Take 10 mg by mouth daily.    Yes [provider]  losartan (COZAAR) 25 MG tablet Take 12.5 mg by mouth daily.   Yes [provider]  Meclizine HCl 25 MG CHEW Chew 2 tablets by mouth as needed (dizziness).    Yes [provider]  Multiple Vitamins-Minerals (MULTIVITAMIN PO) Take 1 tablet by mouth daily. Brand name - Mature Multivitamin from [employer]   Yes [provider]  potassium chloride (KLOR-CON) 10 MEQ tablet Take 10 mEq by mouth daily.   Yes [provider]  Probiotic Product ([place]t) Take 1 tablet by mouth every morning.   Yes [provider]  rOPINIRole (REQUIP) 0.25 MG tablet TAKE 1 TO 2  TABLETS BY MOUTH AT NIGHT Patient taking differently: Take 0.5 mg by mouth at bedtime.  11/20/19  Yes Sater, Nanine Means, MD  Vitamin D, Ergocalciferol, (DRISDOL) 50000 UNITS CAPS capsule Take 1 capsule (50,000 Units total) by mouth every 7 (seven) days. When finished, take an otc daily vitamin d supplement of 5,000iu. Patient taking differently: Take 50,000 Units by mouth every Sunday.  02/11/15  Yes Sater, Nanine Means, MD  cephALEXin (KEFLEX) 500 MG capsule Take 2 capsules (1,000 mg total) by mouth 2 (two) times daily. Patient not taking: Reported on 12/04/2019 10/31/19   Charlesetta Shanks, MD  pantoprazole (PROTONIX) 40 MG tablet Take 1 tablet (40 mg total) by mouth every morning. Patient not taking: Reported on 12/04/2019 08/03/16   Javier Glazier, MD    Scheduled Meds: . 4-Aminopyridine  5 mg Oral TID  . amantadine  100 mg Oral TID  . amphetamine-dextroamphetamine  15 mg Oral BID  . calcium carbonate  1,250 mg Oral BID WC  . carvedilol  3.125 mg Oral BID WC  . donepezil  10 mg Oral QHS  . enoxaparin (LOVENOX) injection  40 mg Subcutaneous Daily  . lamoTRIgine  150 mg Oral BID  . loratadine  10 mg Oral Daily  . multivitamin with minerals  1 tablet Oral Daily  . pantoprazole  40 mg Oral Daily  . rOPINIRole  0.5 mg Oral QHS   Continuous Infusions: . 0.9 %  NaCl with KCl 20 mEq / L 75 mL/hr at 12/05/19 1725   PRN Meds:.acetaminophen **OR** acetaminophen, ondansetron (ZOFRAN) IV  Allergies as of 12/04/2019 - Review Complete 12/04/2019  Allergen Reaction Noted  . Other Shortness Of Breath 01/09/2015  . Betaseron [interferon beta-1b] Other (See Comments) 05/13/2012  . Codeine Nausea And Vomiting   . Gilenya [fingolimod hydrochloride] Other (See Comments) 03/16/2012  . Morphine and related Other (See Comments) 07/09/2011  . Sumatriptan Nausea Only   . Tysabri [natalizumab] Other (See Comments) 07/09/2011  . Latex Rash     Family History  Problem Relation Age of Onset  . Liver disease Father   . Cirrhosis Father   . Stroke Mother   . Cancer Mother        oropharyngeal  . Heart attack Mother   . Anesthesia problems Mother        post-op nausea  . Cancer Brother        twin; tonsillar?  . Cancer Sister 58       breast ca, lung ca  . Cancer Other        Nephew; appendiceal carcnoid, melanoma  . Cancer Paternal Grandmother        cervical    Social History   Socioeconomic History  . Marital status: Married    Spouse name: Gershon Mussel  . Number of children: 2  . Years of education: college  . Highest education level: Not on file  Occupational History  . Occupation: Retired  Scientific laboratory technician  . Financial resource strain: Not on file  . Food insecurity    Worry: Not on file    Inability: Not on file  . Transportation needs    Medical: Not on file    Non-medical: Not on file  Tobacco Use  . Smoking status: Passive Smoke Exposure - Never Smoker  . Smokeless tobacco: Never Used  . Tobacco comment: parents, husband, & multiple family members  Substance and Sexual Activity  . Alcohol use: No    Alcohol/week: 0.0 standard drinks  . Drug use:  No  . Sexual activity: Not on file  Lifestyle  . Physical activity    Days per week: Not on file    Minutes per session: Not on file  . Stress: Not on file  Relationships  . Social Product manager on phone: Not on file    Gets together: Not on file    Attends religious service: Not on file    Active member of club or organization: Not on file    Attends meetings of clubs or organizations: Not on file    Relationship status: Not on file  . Intimate partner violence    Fear of current or ex partner: Not on file    Emotionally abused: Not on file    Physically abused: Not on file    Forced sexual activity: Not on file  Other Topics Concern  . Not on file  Social History Narrative   Pt lives at home with her spouse of 67 years.   Caffeine Use- Drinks caffeine occasionally.      [facility]:   From [place]. Previously did administrative work. She currently has a cat & dog. No bird exposure. No mold or hot tub exposure. No carpet or draperies.        Review of Systems: All negative except as stated above in HPI.  Physical Exam: Vital signs: Vitals:   12/05/19 1145 12/05/19 1448  BP:  99/65  Pulse:  95  Resp: (!) 21 18  Temp:  98.3 F (36.8 C)  SpO2:  100%     Physical Exam  Constitutional: She is oriented to person, place, and time. She appears well-developed and well-nourished. No distress.  HENT:  Head: Normocephalic and atraumatic.  Eyes: EOM are normal. No scleral icterus.  Neck: Normal range of motion. Neck supple.  Cardiovascular: Normal rate and regular rhythm.  Pulmonary/Chest: Effort normal and breath sounds normal. No respiratory distress.  Abdominal: Soft. Bowel sounds are normal. She exhibits no distension. There is no abdominal tenderness. There is no rebound and no guarding.  Musculoskeletal: Normal range of motion.        General: No edema.  Neurological: She is alert and oriented to person, place, and time.  Skin: Skin is warm. No erythema.  Psychiatric: She has a normal mood and affect. Thought content normal.  Vitals reviewed.   GI:  Lab Results: Recent Labs    12/04/19 1633 12/05/19 0354  WBC 8.8 7.0  HGB 15.1* 12.9  HCT 45.5  40.0  PLT 292 226   BMET Recent Labs    12/04/19 1633 12/05/19 0354  NA 140 143  K 3.4* 3.1*  CL 103 111  CO2 28 24  GLUCOSE 110* 83  BUN 15 13  CREATININE 1.36* 0.91  CALCIUM 8.9 7.6*   LFT Recent Labs    12/05/19 0354  PROT 5.3*  ALBUMIN 3.1*  AST 13*  ALT 13  ALKPHOS 67  BILITOT 0.2*   PT/INR No results for input(s): LABPROT, INR in the last 72 hours.   Studies/Results: Dg Chest Port 1 View  Result Date: 12/04/2019 CLINICAL DATA:  Weakness and diarrhea for 4 days. Possible CHF. EXAM: PORTABLE CHEST 1 VIEW COMPARISON:  Chest radiograph 04/25/2019. CT 05/17/2016 FINDINGS: The cardiomediastinal contours are normal. Minimal biapical pleuroparenchymal scarring. Subsegmental atelectasis at the left lung base. Pulmonary vasculature is normal. No consolidation, pleural effusion, or pneumothorax. No acute osseous abnormalities are seen. IMPRESSION: Subsegmental atelectasis at the left lung base. Otherwise no acute  abnormality. No evidence of CHF. Electronically Signed   By: Keith Rake M.D.   On: 12/04/2019 22:10    Impression/Plan: Diarrhea with recent history of C. difficile infection.  It was treated with vancomycin as an outpatient by primary care physician.  Differential diagnosis would be recurrent C. difficile infection versus postinfectious irritable bowel syndrome. -Chronic diarrhea.  Patient reported diarrhea since 2017 Acid reflux Abdominal cramps  Recommendations ------------------------- -Repeat stool test with C. difficile and GI pathogen panel.  If positive, treat accordingly.  If negative, consider trial of Colestid or antispasmodic. -Management options were discussed with patient as well as admitting team. -GI will follow  LOS: 0 days   Otis Brace  MD, FACP 12/05/2019, 6:04 PM  Contact #  [phone]

## 2019-12-05 NOTE — ED Notes (Signed)
Pt ambulatory to the restroom without difficulty.

## 2019-12-05 NOTE — Progress Notes (Signed)

## 2019-12-05 NOTE — ED Notes (Signed)
Family at bedside. 

## 2019-12-05 NOTE — Progress Notes (Signed)
PROGRESS NOTE    [name]  [id number] DOB: 1951-06-15 DOA: 12/04/2019 PCP: Crist Infante, MD  Brief Narrative: 68 year old female with history of multiple sclerosis, history of transverse myelitis, history of Takotsubo's cardiomyopathy , suspected Sjogren's disease, severe GERD, recent C. difficile colitis, ongoing severe diarrhea presented to the ED due to ongoing diarrhea and weakness and resultant dehydration. -Went to her PCPs office where her blood pressure was noted to be low and subsequently sent to the ED -In the ED noted mild hypokalemia and mild worsening of creatinine to 1.36  Assessment & Plan:   AKI, Dehydration, hypotension -improved with hydration -Suspect ongoing subacute/chronic diarrhea is the likely culprit, worsened by concomitant diuretic and ARB use -Continue IV fluids today  Subacute diarrhea -Patient reports this is ongoing for 6 to 8 weeks -She gives history of C. difficile colitis, approximately 2 months ago, completed 10-day course of oral vancomycin then, unable to find records for this in epic -now I suspect this is more malabsorption issue versus, autoimmune or inflammatory diarrhea is also possible however lack of fever leukocytosis, tenderness suggests against this -Given recent EGD and colonoscopy by Dr. Watt Climes, request report and [facility] gastroenterology input requested, d/w Dr.Brahmbhatt-per his suggestion will reorder C. difficile PCR again -Continue IV fluids today, patient denies any new medications -Reviewed all her chronic medicines, no clear offending meds noted  Takotsubo's cardiomyopathy -Clinically euvolemic, chest x-ray unremarkable as well -Continue carvedilol -Hold Lasix and losartan today  Multiple sclerosis -Continue amantadine and dalfampridine  ADD -Continue Adderall and Lamictal  Mild cognitive impairment -Continue Aricept  RLS -Continue Requip  DVT prophylaxis: lovenox Code Status: Full COde Family Communication:  No family at bedside Disposition Plan: Home pending clinical improvement, possibly tomorrow  Consultants:  [facility]  Procedures:   Antimicrobials:    Subjective: -Needs to have diarrhea, feels better with fluid administration Objective: Vitals:   12/05/19 1045 12/05/19 1100 12/05/19 1111 12/05/19 1145  BP:   112/74   Pulse: 92  81   Resp: 15 (!) 21  (!) 21  Temp:      TempSrc:      SpO2: 95%     Weight:      Height:        Intake/Output Summary (Last 24 hours) at 12/05/2019 1350 Last data filed at 12/05/2019 0335 Gross per 24 hour  Intake 2000 ml  Output -  Net 2000 ml   Filed Weights   12/04/19 1626  Weight: 67.1 kg    Examination:  General exam: Chronically ill-appearing female, appears older than stated age, AAO x3  respiratory system: Clear to auscultation.  Cardiovascular system: S1 & S2 heard, RRR.  Gastrointestinal system: Abdomen is nondistended, soft and nontender.Normal bowel sounds heard. Central nervous system: Alert and oriented. No focal neurological deficits. Extremities: No edema Skin: No rashes, lesions or ulcers Psychiatry:  appropriate.     Data Reviewed:   CBC: Recent Labs  Lab 12/04/19 1633 12/05/19 0354  WBC 8.8 7.0  HGB 15.1* 12.9  HCT 45.5 40.0  MCV 92.9 95.2  PLT 292 [id number]   Basic Metabolic Panel: Recent Labs  Lab 12/04/19 1633 12/04/19 1956 12/05/19 0354  NA 140  --  143  K 3.4*  --  3.1*  CL 103  --  111  CO2 28  --  24  GLUCOSE 110*  --  83  BUN 15  --  13  CREATININE 1.36*  --  0.91  CALCIUM 8.9  --  7.6*  MG  --  1.9  --    GFR: Estimated Creatinine Clearance: 63.5 mL/min (by C-G formula based on SCr of 0.91 mg/dL). Liver Function Tests: Recent Labs  Lab 12/04/19 1633 12/05/19 0354  AST 16 13*  ALT 16 13  ALKPHOS 84 67  BILITOT 0.4 0.2*  PROT 6.9 5.3*  ALBUMIN 4.0 3.1*   Recent Labs  Lab 12/04/19 1633  LIPASE 33   No results for input(s): AMMONIA in the last 168 hours. Coagulation Profile:  No results for input(s): INR, PROTIME in the last 168 hours. Cardiac Enzymes: No results for input(s): CKTOTAL, CKMB, CKMBINDEX, TROPONINI in the last 168 hours. BNP (last 3 results) No results for input(s): PROBNP in the last 8760 hours. HbA1C: No results for input(s): HGBA1C in the last 72 hours. CBG: No results for input(s): GLUCAP in the last 168 hours. Lipid Profile: No results for input(s): CHOL, HDL, LDLCALC, TRIG, CHOLHDL, LDLDIRECT in the last 72 hours. Thyroid Function Tests: No results for input(s): TSH, T4TOTAL, FREET4, T3FREE, THYROIDAB in the last 72 hours. Anemia Panel: No results for input(s): VITAMINB12, FOLATE, FERRITIN, TIBC, IRON, RETICCTPCT in the last 72 hours. Urine analysis:    Component Value Date/Time   COLORURINE YELLOW 12/05/2019 0104   APPEARANCEUR CLEAR 12/05/2019 0104   LABSPEC 1.016 12/05/2019 0104   PHURINE 6.0 12/05/2019 0104   GLUCOSEU NEGATIVE 12/05/2019 0104   HGBUR SMALL (A) 12/05/2019 0104   BILIRUBINUR NEGATIVE 12/05/2019 0104   KETONESUR NEGATIVE 12/05/2019 0104   PROTEINUR NEGATIVE 12/05/2019 0104   UROBILINOGEN 0.2 09/15/2015 1130   NITRITE NEGATIVE 12/05/2019 0104   LEUKOCYTESUR SMALL (A) 12/05/2019 0104   Sepsis Labs: @LABRCNTIP (procalcitonin:4,lacticidven:4)  ) Recent Results (from the past 240 hour(s))  SARS CORONAVIRUS 2 (TAT 6-24 HRS) Nasopharyngeal Nasopharyngeal Swab     Status: None   Collection Time: 12/05/19  1:50 AM   Specimen: Nasopharyngeal Swab  Result Value Ref Range Status   SARS Coronavirus 2 NEGATIVE NEGATIVE Final    Comment: (NOTE) SARS-CoV-2 target nucleic acids are NOT DETECTED. The SARS-CoV-2 RNA is generally detectable in upper and lower respiratory specimens during the acute phase of infection. Negative results do not preclude SARS-CoV-2 infection, do not rule out co-infections with other pathogens, and should not be used as the sole basis for treatment or other patient management decisions. Negative  results must be combined with clinical observations, patient history, and epidemiological information. The expected result is Negative. Fact Sheet for Patients: SugarRoll.be Fact Sheet for Healthcare Providers: https://www.woods-mathews.com/ This test is not yet approved or cleared by the [place] FDA and  has been authorized for detection and/or diagnosis of SARS-CoV-2 by FDA under an Emergency Use Authorization (EUA). This EUA will remain  in effect (meaning this test can be used) for the duration of the COVID-19 declaration under Section 56 4(b)(1) of the Act, 21 U.S.C. section 360bbb-3(b)(1), unless the authorization is terminated or revoked sooner. Performed at [facility], [place] [address] [place], [place] [address]          Radiology Studies: Dg Chest Port 1 View  Result Date: 12/04/2019 CLINICAL DATA:  Weakness and diarrhea for 4 days. Possible CHF. EXAM: PORTABLE CHEST 1 VIEW COMPARISON:  Chest radiograph 04/25/2019. CT 05/17/2016 FINDINGS: The cardiomediastinal contours are normal. Minimal biapical pleuroparenchymal scarring. Subsegmental atelectasis at the left lung base. Pulmonary vasculature is normal. No consolidation, pleural effusion, or pneumothorax. No acute osseous abnormalities are seen. IMPRESSION: Subsegmental atelectasis at the left lung base. Otherwise no acute  abnormality. No evidence of CHF. Electronically Signed   By: Keith Rake M.D.   On: 12/04/2019 22:10        Scheduled Meds: . 4-Aminopyridine  5 mg Oral TID  . amantadine  100 mg Oral TID  . amphetamine-dextroamphetamine  15 mg Oral BID  . calcium carbonate  1,250 mg Oral BID WC  . carvedilol  3.125 mg Oral BID WC  . donepezil  10 mg Oral QHS  . enoxaparin (LOVENOX) injection  40 mg Subcutaneous Daily  . lamoTRIgine  150 mg Oral BID  . loratadine  10 mg Oral Daily  . losartan  12.5 mg Oral Daily  . multivitamin with minerals  1 tablet  Oral Daily  . pantoprazole  40 mg Oral Daily  . rOPINIRole  0.5 mg Oral QHS   Continuous Infusions:   LOS: 0 days    Time spent: 67min  Domenic Polite, MD [facility]  12/05/2019, 1:50 PM

## 2019-12-05 NOTE — Plan of Care (Signed)

## 2019-12-05 NOTE — ED Notes (Signed)
Patient feeling nauseated and that she might vomit.  MD notified and new orders placed.  Patient to be medicated per new orders.

## 2019-12-05 NOTE — ED Notes (Signed)
Tele   Breakfast ordered  

## 2019-12-05 NOTE — ED Notes (Signed)
Nausea resolving

## 2019-12-06 MED ORDER — DIPHENOXYLATE-ATROPINE 2.5-0.025 MG PO TABS
1.0000 | ORAL_TABLET | Freq: Two times a day (BID) | ORAL | Status: DC
  Administered 2019-12-06 (×2): 1 via ORAL
  Filled 2019-12-06 (×3): qty 1

## 2019-12-06 MED ORDER — ALUM & MAG HYDROXIDE-SIMETH 200-200-20 MG/5ML PO SUSP
30.0000 mL | ORAL | Status: DC | PRN
  Administered 2019-12-06 – 2019-12-07 (×4): 30 mL via ORAL
  Filled 2019-12-06 (×4): qty 30

## 2019-12-06 MED ORDER — COLESTIPOL HCL 1 G PO TABS
2.0000 g | ORAL_TABLET | Freq: Two times a day (BID) | ORAL | Status: DC
  Administered 2019-12-06 – 2019-12-07 (×3): 2 g via ORAL
  Filled 2019-12-06 (×4): qty 2

## 2019-12-06 MED ORDER — POTASSIUM CHLORIDE CRYS ER 20 MEQ PO TBCR
40.0000 meq | EXTENDED_RELEASE_TABLET | Freq: Two times a day (BID) | ORAL | Status: AC
  Administered 2019-12-06 (×2): 40 meq via ORAL
  Filled 2019-12-06 (×2): qty 2

## 2019-12-06 NOTE — Progress Notes (Signed)
[facility] Progress Note  Analysa Doughman Beckles 68 y.o. 08-07-1951  CC: Diarrhea, acid reflux   Subjective: Patient seen and examined at bedside.  She continues to have diarrhea.  Had 4 bowel movement this morning.  Denies any blood in the stool today.  Continues to have worsening acid reflux.  ROS : Negative for chest pain and shortness of breath   Objective: Vital signs in last 24 hours: Vitals:   12/05/19 2316 12/06/19 0405  BP: 116/79 119/80  Pulse: 88 92  Resp: 17 17  Temp: 99.1 F (37.3 C) 99 F (37.2 C)  SpO2: 95% 95%    Physical Exam:  General:  Alert, cooperative, no distress, appears stated age  Head:  Normocephalic, without obvious abnormality, atraumatic  Eyes:  , EOM's intact,   Lungs:   Clear to auscultation bilaterally, respirations unlabored  Heart:  Regular rate and rhythm, S1, S2 normal  Abdomen:   Soft, non-tender, nondistended, bowel sounds present, no peritoneal signs  Extremities: Extremities normal, atraumatic, no  edema  Pulses: 2+ and symmetric    Lab Results: Recent Labs    12/04/19 1633 12/04/19 1956 12/05/19 0354  NA 140  --  143  K 3.4*  --  3.1*  CL 103  --  111  CO2 28  --  24  GLUCOSE 110*  --  83  BUN 15  --  13  CREATININE 1.36*  --  0.91  CALCIUM 8.9  --  7.6*  MG  --  1.9  --    Recent Labs    12/04/19 1633 12/05/19 0354  AST 16 13*  ALT 16 13  ALKPHOS 84 67  BILITOT 0.4 0.2*  PROT 6.9 5.3*  ALBUMIN 4.0 3.1*   Recent Labs    12/04/19 1633 12/05/19 0354  WBC 8.8 7.0  HGB 15.1* 12.9  HCT 45.5 40.0  MCV 92.9 95.2  PLT 292 226   No results for input(s): LABPROT, INR in the last 72 hours.    Assessment/Plan: Diarrhea with recent history of C. difficile infection.  It was treated with vancomycin as an outpatient by primary care physician.  Differential diagnosis would be recurrent C. difficile infection versus postinfectious irritable bowel syndrome. -History of collagenous colitis.  Biopsy  confirmed.  Based on colonoscopy in 2012. -Chronic diarrhea.  Patient reported diarrhea since 2017 Acid reflux Abdominal cramps  Patient had EGD and colonoscopy on November 28, 2019 which showed normal hiatal hernia.  Normal colonoscopy.  Biopsy showed nonspecific inflammation probably from NSAID use.  CT abdomen pelvis in 2018 for chronic abdominal pain was negative for acute changes  Recommendations ------------------------ -C. difficile negative.  GI pathogen panel pending. -Start Colestid 2 g twice a day for now -If no improvement, consider adding budesonide for possible subclinical collagenous colitis.  Random biopsies during colonoscopy in 2012 was positive for collagenous colitis  -GI will follow Otis Brace MD, [place] 12/06/2019, 9:33 AM  Contact #  [phone]

## 2019-12-06 NOTE — Progress Notes (Signed)
PROGRESS NOTE    [name]  [id number] DOB: 1951/04/19 DOA: 12/04/2019 PCP: Crist Infante, MD  Brief Narrative: 68 year old female with history of multiple sclerosis, history of transverse myelitis, history of Takotsubo's cardiomyopathy , suspected Sjogren's disease, severe GERD, recent C. difficile colitis, ongoing severe diarrhea presented to the ED due to ongoing diarrhea and weakness and resultant dehydration. -Went to her PCPs office where her blood pressure was noted to be low and subsequently sent to the ED -In the ED noted mild hypokalemia and mild worsening of creatinine to 1.36  Assessment & Plan:   AKI, Dehydration, hypotension -improved with hydration -due to ongoing subacute/chronic diarrhea, worsened by concomitant diuretic and ARB use -Will continue IV fluids with potassium another day will cut down rate to 50 cc an hour today  Acute on chronic diarrhea -Patient is a very poor historian but relates that she has had chronic diarrhea for approximately 3 years, however this has gotten worse in the last 6 weeks -She reports that her PCP treated her for C. difficile colitis about 6 weeks ago with a 10-day course of oral vancomycin, but says that eventually C. difficile turned out to be negative then and now -Also underwent EGD and colonoscopy by Dr. Watt Climes last week, EGD was unremarkable, colonoscopy biopsy noted nonspecific inflammation possibly from NSAID use -Appreciate [facility] input -Started on cholestyramine -Also add Lomotil twice daily today -Monitor response -I reviewed all her medications, no offending agents noted  Takotsubo's cardiomyopathy -Clinically euvolemic, chest x-ray unremarkable as well -Continue carvedilol -Hold Lasix and losartan today  Multiple sclerosis -Continue amantadine and dalfampridine  ADD -Continue Adderall and Lamictal  Mild cognitive impairment -Continue Aricept  RLS -Continue Requip  DVT prophylaxis: lovenox Code  Status: Full COde Family Communication: No family at bedside Disposition Plan: Home pending improvement in diarrhea, possibly 1 to 2 days  Consultants:  [facility]  Procedures:   Antimicrobials:    Subjective: -Continues to have profuse diarrhea had 4 episodes this morning  Objective: Vitals:   12/05/19 1806 12/05/19 2203 12/05/19 2316 12/06/19 0405  BP: 105/67 110/74 116/79 119/80  Pulse: 93  88 92  Resp: 16  17 17   Temp: 98.1 F (36.7 C)  99.1 F (37.3 C) 99 F (37.2 C)  TempSrc: Oral  Oral Oral  SpO2: 99%  95% 95%  Weight:    70.8 kg  Height:        Intake/Output Summary (Last 24 hours) at 12/06/2019 1200 Last data filed at 12/06/2019 0300 Gross per 24 hour  Intake 681.28 ml  Output 50 ml  Net 631.28 ml   Filed Weights   12/04/19 1626 12/06/19 0405  Weight: 67.1 kg 70.8 kg    Examination:  Gen: Chronically ill-appearing female, appears older than stated age, AAO x3 HEENT: PERRLA, Neck supple, no JVD Lungs: Clear bilaterally CVS: RRR,No Gallops,Rubs or new Murmurs Abd: Soft, nontender, nondistended, bowel sounds present  extremities: No edema Skin: no new rashes Psychiatry:  appropriate.     Data Reviewed:   CBC: Recent Labs  Lab 12/04/19 1633 12/05/19 0354  WBC 8.8 7.0  HGB 15.1* 12.9  HCT 45.5 40.0  MCV 92.9 95.2  PLT 292 [id number]   Basic Metabolic Panel: Recent Labs  Lab 12/04/19 1633 12/04/19 1956 12/05/19 0354  NA 140  --  143  K 3.4*  --  3.1*  CL 103  --  111  CO2 28  --  24  GLUCOSE 110*  --  83  BUN 15  --  13  CREATININE 1.36*  --  0.91  CALCIUM 8.9  --  7.6*  MG  --  1.9  --    GFR: Estimated Creatinine Clearance: 64.9 mL/min (by C-G formula based on SCr of 0.91 mg/dL). Liver Function Tests: Recent Labs  Lab 12/04/19 1633 12/05/19 0354  AST 16 13*  ALT 16 13  ALKPHOS 84 67  BILITOT 0.4 0.2*  PROT 6.9 5.3*  ALBUMIN 4.0 3.1*   Recent Labs  Lab 12/04/19 1633  LIPASE 33   No results for input(s): AMMONIA in the  last 168 hours. Coagulation Profile: No results for input(s): INR, PROTIME in the last 168 hours. Cardiac Enzymes: No results for input(s): CKTOTAL, CKMB, CKMBINDEX, TROPONINI in the last 168 hours. BNP (last 3 results) No results for input(s): PROBNP in the last 8760 hours. HbA1C: No results for input(s): HGBA1C in the last 72 hours. CBG: No results for input(s): GLUCAP in the last 168 hours. Lipid Profile: No results for input(s): CHOL, HDL, LDLCALC, TRIG, CHOLHDL, LDLDIRECT in the last 72 hours. Thyroid Function Tests: No results for input(s): TSH, T4TOTAL, FREET4, T3FREE, THYROIDAB in the last 72 hours. Anemia Panel: No results for input(s): VITAMINB12, FOLATE, FERRITIN, TIBC, IRON, RETICCTPCT in the last 72 hours. Urine analysis:    Component Value Date/Time   COLORURINE YELLOW 12/05/2019 0104   APPEARANCEUR CLEAR 12/05/2019 0104   LABSPEC 1.016 12/05/2019 0104   PHURINE 6.0 12/05/2019 0104   GLUCOSEU NEGATIVE 12/05/2019 0104   HGBUR SMALL (A) 12/05/2019 0104   BILIRUBINUR NEGATIVE 12/05/2019 0104   KETONESUR NEGATIVE 12/05/2019 0104   PROTEINUR NEGATIVE 12/05/2019 0104   UROBILINOGEN 0.2 09/15/2015 1130   NITRITE NEGATIVE 12/05/2019 0104   LEUKOCYTESUR SMALL (A) 12/05/2019 0104   Sepsis Labs: @LABRCNTIP (procalcitonin:4,lacticidven:4)  ) Recent Results (from the past 240 hour(s))  SARS CORONAVIRUS 2 (TAT 6-24 HRS) Nasopharyngeal Nasopharyngeal Swab     Status: None   Collection Time: 12/05/19  1:50 AM   Specimen: Nasopharyngeal Swab  Result Value Ref Range Status   SARS Coronavirus 2 NEGATIVE NEGATIVE Final    Comment: (NOTE) SARS-CoV-2 target nucleic acids are NOT DETECTED. The SARS-CoV-2 RNA is generally detectable in upper and lower respiratory specimens during the acute phase of infection. Negative results do not preclude SARS-CoV-2 infection, do not rule out co-infections with other pathogens, and should not be used as the sole basis for treatment or other  patient management decisions. Negative results must be combined with clinical observations, patient history, and epidemiological information. The expected result is Negative. Fact Sheet for Patients: SugarRoll.be Fact Sheet for Healthcare Providers: https://www.woods-mathews.com/ This test is not yet approved or cleared by the [place] FDA and  has been authorized for detection and/or diagnosis of SARS-CoV-2 by FDA under an Emergency Use Authorization (EUA). This EUA will remain  in effect (meaning this test can be used) for the duration of the COVID-19 declaration under Section 56 4(b)(1) of the Act, 21 U.S.C. section 360bbb-3(b)(1), unless the authorization is terminated or revoked sooner. Performed at [facility], [place] [address], [place], [place] [address]   C difficile quick scan w PCR reflex     Status: None   Collection Time: 12/05/19  8:11 PM   Specimen: Stool  Result Value Ref Range Status   C Diff antigen NEGATIVE NEGATIVE Final   C Diff toxin NEGATIVE NEGATIVE Final   C Diff interpretation No C. difficile detected.  Final    Comment: Performed at  [facility], [place] [address], [place], [place] [address]         Radiology Studies: DG Chest Port 1 View  Result Date: 12/04/2019 CLINICAL DATA:  Weakness and diarrhea for 4 days. Possible CHF. EXAM: PORTABLE CHEST 1 VIEW COMPARISON:  Chest radiograph 04/25/2019. CT 05/17/2016 FINDINGS: The cardiomediastinal contours are normal. Minimal biapical pleuroparenchymal scarring. Subsegmental atelectasis at the left lung base. Pulmonary vasculature is normal. No consolidation, pleural effusion, or pneumothorax. No acute osseous abnormalities are seen. IMPRESSION: Subsegmental atelectasis at the left lung base. Otherwise no acute abnormality. No evidence of CHF. Electronically Signed   By: Keith Rake M.D.   On: 12/04/2019 22:10        Scheduled Meds:   4-Aminopyridine  5 mg Oral TID   amantadine  100 mg Oral TID   amphetamine-dextroamphetamine  15 mg Oral BID   calcium carbonate  1,250 mg Oral BID WC   carvedilol  3.125 mg Oral BID WC   colestipol  2 g Oral BID   diphenoxylate-atropine  1 tablet Oral BID   donepezil  10 mg Oral QHS   enoxaparin (LOVENOX) injection  40 mg Subcutaneous Daily   lamoTRIgine  150 mg Oral BID   loratadine  10 mg Oral Daily   multivitamin with minerals  1 tablet Oral Daily   pantoprazole  40 mg Oral Daily   potassium chloride  40 mEq Oral BID   rOPINIRole  0.5 mg Oral QHS   Continuous Infusions:  0.9 % NaCl with KCl 20 mEq / L 50 mL/hr (12/06/19 1042)     LOS: 1 day    Time spent: 50min  Domenic Polite, MD [facility]  12/06/2019, 12:00 PM   PROGRESS NOTE    CLEMMA MANDALA  [id number] DOB: 03-20-1951 DOA: 12/04/2019 PCP: Crist Infante, MD  Brief Narrative: 68 year old female with history of multiple sclerosis, history of transverse myelitis, history of Takotsubo's cardiomyopathy , suspected Sjogren's disease, severe GERD, recent C. difficile colitis, ongoing severe diarrhea presented to the ED due to ongoing diarrhea and weakness and resultant dehydration. -Went to her PCPs office where her blood pressure was noted to be low and subsequently sent to the ED -In the ED noted mild hypokalemia and mild worsening of creatinine to 1.36  Assessment & Plan:   AKI, Dehydration, hypotension -improved with hydration -Suspect ongoing subacute/chronic diarrhea is the likely culprit, worsened by concomitant diuretic and ARB use -Continue IV fluids today  Subacute diarrhea -Patient reports this is ongoing for 6 to 8 weeks -She gives history of C. difficile colitis, approximately 2 months ago, completed 10-day course of oral vancomycin then, unable to find records for this in epic -now I suspect this is more malabsorption issue versus, autoimmune or inflammatory diarrhea is also  possible however lack of fever leukocytosis, tenderness suggests against this -Given recent EGD and colonoscopy by Dr. Watt Climes, request report and [facility] gastroenterology input requested, d/w Dr.Brahmbhatt-per his suggestion will reorder C. difficile PCR again -Continue IV fluids today, patient denies any new medications -Reviewed all her chronic medicines, no clear offending meds noted  Takotsubo's cardiomyopathy -Clinically euvolemic, chest x-ray unremarkable as well -Continue carvedilol -Hold Lasix and losartan today  Multiple sclerosis -Continue amantadine and dalfampridine  ADD -Continue Adderall and Lamictal  Mild cognitive impairment -Continue Aricept  RLS -Continue Requip  DVT prophylaxis: lovenox Code Status: Full COde Family Communication: No family at bedside Disposition Plan: Home pending clinical improvement, possibly tomorrow  Consultants:  [facility]  Procedures:  Antimicrobials:    Subjective: -Needs to have diarrhea, feels better with fluid administration Objective: Vitals:   12/05/19 1806 12/05/19 2203 12/05/19 2316 12/06/19 0405  BP: 105/67 110/74 116/79 119/80  Pulse: 93  88 92  Resp: 16  17 17   Temp: 98.1 F (36.7 C)  99.1 F (37.3 C) 99 F (37.2 C)  TempSrc: Oral  Oral Oral  SpO2: 99%  95% 95%  Weight:    70.8 kg  Height:        Intake/Output Summary (Last 24 hours) at 12/06/2019 1202 Last data filed at 12/06/2019 0300 Gross per 24 hour  Intake 681.28 ml  Output 50 ml  Net 631.28 ml   Filed Weights   12/04/19 1626 12/06/19 0405  Weight: 67.1 kg 70.8 kg    Examination:  General exam: Chronically ill-appearing female, appears older than stated age, AAO x3  respiratory system: Clear to auscultation.  Cardiovascular system: S1 & S2 heard, RRR.  Gastrointestinal system: Abdomen is nondistended, soft and nontender.Normal bowel sounds heard. Central nervous system: Alert and oriented. No focal neurological deficits. Extremities: No  edema Skin: No rashes, lesions or ulcers Psychiatry:  appropriate.     Data Reviewed:   CBC: Recent Labs  Lab 12/04/19 1633 12/05/19 0354  WBC 8.8 7.0  HGB 15.1* 12.9  HCT 45.5 40.0  MCV 92.9 95.2  PLT 292 [id number]   Basic Metabolic Panel: Recent Labs  Lab 12/04/19 1633 12/04/19 1956 12/05/19 0354  NA 140  --  143  K 3.4*  --  3.1*  CL 103  --  111  CO2 28  --  24  GLUCOSE 110*  --  83  BUN 15  --  13  CREATININE 1.36*  --  0.91  CALCIUM 8.9  --  7.6*  MG  --  1.9  --    GFR: Estimated Creatinine Clearance: 64.9 mL/min (by C-G formula based on SCr of 0.91 mg/dL). Liver Function Tests: Recent Labs  Lab 12/04/19 1633 12/05/19 0354  AST 16 13*  ALT 16 13  ALKPHOS 84 67  BILITOT 0.4 0.2*  PROT 6.9 5.3*  ALBUMIN 4.0 3.1*   Recent Labs  Lab 12/04/19 1633  LIPASE 33   No results for input(s): AMMONIA in the last 168 hours. Coagulation Profile: No results for input(s): INR, PROTIME in the last 168 hours. Cardiac Enzymes: No results for input(s): CKTOTAL, CKMB, CKMBINDEX, TROPONINI in the last 168 hours. BNP (last 3 results) No results for input(s): PROBNP in the last 8760 hours. HbA1C: No results for input(s): HGBA1C in the last 72 hours. CBG: No results for input(s): GLUCAP in the last 168 hours. Lipid Profile: No results for input(s): CHOL, HDL, LDLCALC, TRIG, CHOLHDL, LDLDIRECT in the last 72 hours. Thyroid Function Tests: No results for input(s): TSH, T4TOTAL, FREET4, T3FREE, THYROIDAB in the last 72 hours. Anemia Panel: No results for input(s): VITAMINB12, FOLATE, FERRITIN, TIBC, IRON, RETICCTPCT in the last 72 hours. Urine analysis:    Component Value Date/Time   COLORURINE YELLOW 12/05/2019 0104   APPEARANCEUR CLEAR 12/05/2019 0104   LABSPEC 1.016 12/05/2019 0104   PHURINE 6.0 12/05/2019 0104   GLUCOSEU NEGATIVE 12/05/2019 0104   HGBUR SMALL (A) 12/05/2019 0104   BILIRUBINUR NEGATIVE 12/05/2019 0104   KETONESUR NEGATIVE 12/05/2019 0104    PROTEINUR NEGATIVE 12/05/2019 0104   UROBILINOGEN 0.2 09/15/2015 1130   NITRITE NEGATIVE 12/05/2019 0104   LEUKOCYTESUR SMALL (A) 12/05/2019 0104   Sepsis Labs: @LABRCNTIP (procalcitonin:4,lacticidven:4)  ) Recent Results (from the  past 240 hour(s))  SARS CORONAVIRUS 2 (TAT 6-24 HRS) Nasopharyngeal Nasopharyngeal Swab     Status: None   Collection Time: 12/05/19  1:50 AM   Specimen: Nasopharyngeal Swab  Result Value Ref Range Status   SARS Coronavirus 2 NEGATIVE NEGATIVE Final    Comment: (NOTE) SARS-CoV-2 target nucleic acids are NOT DETECTED. The SARS-CoV-2 RNA is generally detectable in upper and lower respiratory specimens during the acute phase of infection. Negative results do not preclude SARS-CoV-2 infection, do not rule out co-infections with other pathogens, and should not be used as the sole basis for treatment or other patient management decisions. Negative results must be combined with clinical observations, patient history, and epidemiological information. The expected result is Negative. Fact Sheet for Patients: SugarRoll.be Fact Sheet for Healthcare Providers: https://www.woods-mathews.com/ This test is not yet approved or cleared by the [place] FDA and  has been authorized for detection and/or diagnosis of SARS-CoV-2 by FDA under an Emergency Use Authorization (EUA). This EUA will remain  in effect (meaning this test can be used) for the duration of the COVID-19 declaration under Section 56 4(b)(1) of the Act, 21 U.S.C. section 360bbb-3(b)(1), unless the authorization is terminated or revoked sooner. Performed at [facility], [place] [address], [place], [place] [address]   C difficile quick scan w PCR reflex     Status: None   Collection Time: 12/05/19  8:11 PM   Specimen: Stool  Result Value Ref Range Status   C Diff antigen NEGATIVE NEGATIVE Final   C Diff toxin NEGATIVE NEGATIVE Final   C Diff  interpretation No C. difficile detected.  Final    Comment: Performed at [facility], [place] [address], [place], [place] [address]         Radiology Studies: DG Chest Port 1 View  Result Date: 12/04/2019 CLINICAL DATA:  Weakness and diarrhea for 4 days. Possible CHF. EXAM: PORTABLE CHEST 1 VIEW COMPARISON:  Chest radiograph 04/25/2019. CT 05/17/2016 FINDINGS: The cardiomediastinal contours are normal. Minimal biapical pleuroparenchymal scarring. Subsegmental atelectasis at the left lung base. Pulmonary vasculature is normal. No consolidation, pleural effusion, or pneumothorax. No acute osseous abnormalities are seen. IMPRESSION: Subsegmental atelectasis at the left lung base. Otherwise no acute abnormality. No evidence of CHF. Electronically Signed   By: Keith Rake M.D.   On: 12/04/2019 22:10        Scheduled Meds:  4-Aminopyridine  5 mg Oral TID   amantadine  100 mg Oral TID   amphetamine-dextroamphetamine  15 mg Oral BID   calcium carbonate  1,250 mg Oral BID WC   carvedilol  3.125 mg Oral BID WC   colestipol  2 g Oral BID   diphenoxylate-atropine  1 tablet Oral BID   donepezil  10 mg Oral QHS   enoxaparin (LOVENOX) injection  40 mg Subcutaneous Daily   lamoTRIgine  150 mg Oral BID   loratadine  10 mg Oral Daily   multivitamin with minerals  1 tablet Oral Daily   pantoprazole  40 mg Oral Daily   potassium chloride  40 mEq Oral BID   rOPINIRole  0.5 mg Oral QHS   Continuous Infusions:  0.9 % NaCl with KCl 20 mEq / L 50 mL/hr (12/06/19 1042)     LOS: 1 day    Time spent: 65min  Domenic Polite, MD [facility]  12/06/2019, 12:02 PM

## 2019-12-07 LAB — BASIC METABOLIC PANEL
Anion gap: 8 (ref 5–15)
BUN: 5 mg/dL — ABNORMAL LOW (ref 8–23)
CO2: 27 mmol/L (ref 22–32)
Calcium: 8.8 mg/dL — ABNORMAL LOW (ref 8.9–10.3)
Chloride: 105 mmol/L (ref 98–111)
Creatinine, Ser: 0.94 mg/dL (ref 0.44–1.00)
GFR calc Af Amer: >60 mL/min (ref >60)
GFR calc non Af Amer: >60 mL/min (ref >60)
Glucose, Bld: 101 mg/dL — ABNORMAL HIGH (ref 70–99)
Potassium: 4.1 mmol/L (ref 3.5–5.1)
Sodium: 140 mmol/L (ref 135–145)

## 2019-12-07 MED ORDER — PANTOPRAZOLE SODIUM 40 MG PO TBEC
40.0000 mg | DELAYED_RELEASE_TABLET | Freq: Two times a day (BID) | ORAL | Status: DC
  Administered 2019-12-07: 40 mg via ORAL

## 2019-12-07 MED ORDER — DIPHENOXYLATE-ATROPINE 2.5-0.025 MG PO TABS
1.0000 | ORAL_TABLET | Freq: Every day | ORAL | Status: DC
  Administered 2019-12-07: 1 via ORAL

## 2019-12-07 MED ORDER — CARVEDILOL 6.25 MG PO TABS
6.2500 mg | ORAL_TABLET | Freq: Two times a day (BID) | ORAL | Status: DC
  Administered 2019-12-07: 6.25 mg via ORAL
  Filled 2019-12-07: qty 1

## 2019-12-07 MED ORDER — COLESTIPOL HCL 1 G PO TABS: 2.0000 g | ORAL_TABLET | Freq: Two times a day (BID) | ORAL | 0 refills | Status: DC

## 2019-12-07 MED ORDER — FUROSEMIDE 20 MG PO TABS: 20.0000 mg | ORAL_TABLET | ORAL | 0 refills | Status: DC | PRN

## 2019-12-07 NOTE — Progress Notes (Signed)
[facility] Progress Note  [name] 68 y.o. [birth year]  CC: Diarrhea, acid reflux   Subjective: Patient seen and examined at bedside.  Feeling much better.  Diarrhea resolved.  No bowel movement since last night.  Acid reflux also under control.  ROS : Negative for chest pain and shortness of breath   Objective: Vital signs in last 24 hours: Vitals:   12/06/19 2332 12/07/19 0535  BP: 117/81 119/76  Pulse: 88 99  Resp: 18 18  Temp: 99 F (37.2 C) 98.1 F (36.7 C)  SpO2: 96% 98%    Physical Exam:  General:  Alert, cooperative, no distress, appears stated age  Head:  Normocephalic, without obvious abnormality, atraumatic  Eyes:  , EOM's intact,   Lungs:   Clear to auscultation bilaterally, respirations unlabored  Heart:  Regular rate and rhythm, S1, S2 normal  Abdomen:   Soft, non-tender, nondistended, bowel sounds present, no peritoneal signs  Extremities: Extremities normal, atraumatic, no  edema  Pulses: 2+ and symmetric    Lab Results: Recent Labs    12/04/19 1956 12/05/19 0354 12/07/19 0559  NA  --  143 140  K  --  3.1* 4.1  CL  --  111 105  CO2  --  24 27  GLUCOSE  --  83 101*  BUN  --  13 5*  CREATININE  --  0.91 0.94  CALCIUM  --  7.6* 8.8*  MG 1.9  --   --    Recent Labs    12/04/19 1633 12/05/19 0354  AST 16 13*  ALT 16 13  ALKPHOS 84 67  BILITOT 0.4 0.2*  PROT 6.9 5.3*  ALBUMIN 4.0 3.1*   Recent Labs    12/04/19 1633 12/05/19 0354  WBC 8.8 7.0  HGB 15.1* 12.9  HCT 45.5 40.0  MCV 92.9 95.2  PLT 292 226   No results for input(s): LABPROT, INR in the last 72 hours.    Assessment/Plan: Diarrhea with recent history of C. difficile infection.  It was treated with vancomycin as an outpatient by primary care physician.  Differential diagnosis would be recurrent C. difficile infection versus postinfectious irritable bowel syndrome. -History of collagenous colitis.  Biopsy confirmed.  Based on colonoscopy in  2012. -Chronic diarrhea.  Patient reported diarrhea since 2017 Acid reflux Abdominal cramps  Patient had EGD and colonoscopy on November 28, 2019 which showed normal hiatal hernia.  Normal colonoscopy.  Biopsy showed nonspecific inflammation probably from NSAID use.  CT abdomen pelvis in 2018 for chronic abdominal pain was negative for acute changes  Recommendations ------------------------ -C. difficile negative.  GI pathogen panel pending,  -Diarrhea resolved now. -I think it is okay to discharge her home with Colestid 2 g at night.  She was advised to increase it to 2 g twice a day if starts having diarrhea again.   If no improvement, consider adding budesonide for possible subclinical collagenous colitis as on outpatient. .  Random biopsies during colonoscopy in 2012 was positive for collagenous colitis  -GI will sign off.  Follow-up with Dr. Driscilla Grammes in 4 weeks after discharge.  Otis Brace MD, [place] 12/07/2019, 9:38 AM  Contact #  [phone]

## 2019-12-07 NOTE — Progress Notes (Signed)
Nsg Discharge Note  Admit Date:  12/04/2019 Discharge date: 12/07/2019   [name] to be D/C'd home per MD order.  AVS completed. Patient/caregiver able to verbalize understanding.  Discharge Medication: Allergies as of 12/07/2019      Reactions   Other Shortness Of Breath   PURPLE LETTUCE   Betaseron [interferon Beta-1b] Other (See Comments)   HARD NODULAR AREAS AT INJECTION SITE   Codeine Nausea And Vomiting   Gilenya [fingolimod Hydrochloride] Other (See Comments)   MACULAR EDEMA   Morphine And Related Other (See Comments)   IV ROUTE - RED STREAKS OF VEIN   Sumatriptan Nausea Only   INJECTABLE ONLY - RAPID HEART RATE, FLUSHING   Tysabri [natalizumab] Other (See Comments)   DEVELOPED JCV ANTIBODY   Latex Rash      Medication List    STOP taking these medications   cephALEXin 500 MG capsule Commonly known as: Keflex   losartan 25 MG tablet Commonly known as: COZAAR   pantoprazole 40 MG tablet Commonly known as: PROTONIX     TAKE these medications   4-Aminopyridine Powd Take 5 mg by mouth 3 (three) times daily. Pharmacy dispense 270 capsules with 3 refills. Thank you   Amantadine HCl 100 MG tablet Take 1 tablet (100 mg total) by mouth 3 (three) times daily.   amphetamine-dextroamphetamine 15 MG tablet Commonly known as: Adderall Take one po qAm and one po 4 hours later What changed:   how much to take  how to take this  when to take this   Biotin 10000 MCG Tabs Take 1 tablet by mouth daily.   calcium carbonate 600 MG Tabs tablet Commonly known as: OS-CAL Take 1,200 mg by mouth.   carvedilol 3.125 MG tablet Commonly known as: COREG Take 3.125 mg by mouth 2 (two) times daily with a meal.   colestipol 1 g tablet Commonly known as: COLESTID Take 2 tablets (2 g total) by mouth 2 (two) times daily.   denosumab 60 MG/ML Soln injection Commonly known as: PROLIA Inject 60 mg into the skin every 6 (six) months. Administer in upper arm, thigh, or  abdomen   Dexilant 60 MG capsule Generic drug: dexlansoprazole Take 1 capsule by mouth 2 (two) times daily.   donepezil 10 MG tablet Commonly known as: ARICEPT Take 1 tablet (10 mg total) by mouth at bedtime.   furosemide 20 MG tablet Commonly known as: LASIX Take 1 tablet (20 mg total) by mouth every other day as needed for fluid or edema. What changed:   when to take this  reasons to take this   lamoTRIgine 150 MG tablet Commonly known as: LAMICTAL TAKE 1 TABLET BY MOUTH TWICE A DAY   loratadine 10 MG tablet Commonly known as: CLARITIN Take 10 mg by mouth daily.   Meclizine HCl 25 MG Chew Chew 2 tablets by mouth as needed (dizziness).   MULTIVITAMIN PO Take 1 tablet by mouth daily. Brand name - Mature Multivitamin from [place] Take 1 tablet by mouth every morning.   potassium chloride 10 MEQ tablet Commonly known as: KLOR-CON Take 10 mEq by mouth daily.   rOPINIRole 0.25 MG tablet Commonly known as: REQUIP TAKE 1 TO 2 TABLETS BY MOUTH AT NIGHT What changed:   how much to take  how to take this  when to take this  additional instructions   Vitamin D (Ergocalciferol) 1.25 MG (50000 UT) Caps capsule Commonly known as: DRISDOL Take 1 capsule (  50,000 Units total) by mouth every 7 (seven) days. When finished, take an otc daily vitamin d supplement of 5,000iu. What changed:   when to take this  additional instructions       Discharge Assessment: Vitals:   12/06/19 2332 12/07/19 0535  BP: 117/81 119/76  Pulse: 88 99  Resp: 18 18  Temp: 99 F (37.2 C) 98.1 F (36.7 C)  SpO2: 96% 98%   Skin clean, dry and intact without evidence of skin break down, no evidence of skin tears noted. IV catheter discontinued intact. Site without signs and symptoms of complications - no redness or edema noted at insertion site, patient denies c/o pain - only slight tenderness at site.  Dressing with slight pressure applied.  D/c  Instructions-Education: Discharge instructions given to patient/family with verbalized understanding. D/c education completed with patient/family including follow up instructions, medication list, d/c activities limitations if indicated, with other d/c instructions as indicated by MD - patient able to verbalize understanding, all questions fully answered. Patient instructed to return to ED, call 911, or call MD for any changes in condition.  Patient escorted via [place], and D/C home via private auto.  Atilano Ina, RN 12/07/2019 12:29 PM

## 2019-12-09 NOTE — Discharge Summary (Signed)
Physician Discharge Summary  [name] [id number] DOB: 04-08-1951 DOA: 12/04/2019  PCP: Crist Infante, MD  Admit date: 12/04/2019 Discharge date: 12/07/2019  Time spent: 35 minutes  Recommendations for Outpatient Follow-up:  PCP Dr. Haynes Kerns in 1 week [facility] Dr. Watt Climes in 2 to 3 weeks for chronic diarrhea, consider oral budesonide if diarrhea persists for collagenous colitis   Discharge Diagnoses:  Active Problems:   Nausea   Diarrhea   Hypokalemia   Dehydration Dehydration Acute kidney injury Multiple sclerosis Mild memory loss Takotsubo's cardiomyopathy  Discharge Condition: Stable  Diet recommendation: Regular, low-sodium  Filed Weights   12/04/19 1626 12/06/19 0405 12/07/19 0535  Weight: 67.1 kg 70.8 kg 70 kg    History of present illness:   68 year old female with history of multiple sclerosis, history of transverse myelitis, history of Takotsubo's cardiomyopathy , suspected Sjogren's disease, severe GERD, recent C. difficile colitis, ongoing severe diarrhea presented to the ED due to ongoing diarrhea and weakness and resultant dehydration. -Went to her PCPs office where her blood pressure was noted to be low and subsequently sent to the ED -In the ED noted mild hypokalemia and mild worsening of creatinine to 1.36   Hospital Course:   AKI, Dehydration, hypotension -improved with hydration -due to ongoing subacute/chronic diarrhea, worsened by concomitant diuretic and ARB use -Stopped ARB and changed Lasix to as needed at discharge  Acute on chronic diarrhea -Patient is a very poor historian but relates that she has had chronic diarrhea for approximately 3 years, however this has gotten worse in the last 6 weeks -She reports that her PCP treated her for C. difficile colitis about 6 weeks ago with a 10-day course of oral vancomycin, but says that eventually C. difficile turned out to be negative then and was negative for infectious diarrhea this admission  as well -Also underwent EGD and colonoscopy by Dr. Watt Climes last week, EGD was unremarkable, colonoscopy biopsy noted nonspecific inflammation . -Per Dr. Alessandra Bevels, she has history of collagenous colitis which was biopsy confirmed, based on colonoscopy in 2012. -Seen by [facility] gastroenterology this admission, she was started on cholestyramine 2 g twice daily, if no improvement gastroenterology could consider adding budesonide for subclinical collagenous colitis as outpatient -Recommend follow-up with her gastroenterologist Dr.Magod in 2 to 3 weeks  Takotsubo's cardiomyopathy -Clinically euvolemic, chest x-ray unremarkable as well -Continue carvedilol -ARB discontinued due to chronic diarrhea with risk of dehydration and AKI and diuretics changed to as needed  Multiple sclerosis -Continue amantadine and dalfampridine  ADD -Continue Adderall and Lamictal  Mild cognitive impairment -Continue Aricept  RLS -Continue Requip   Consultations: [facility]  Discharge Exam: Vitals:   12/06/19 2332 12/07/19 0535  BP: 117/81 119/76  Pulse: 88 99  Resp: 18 18  Temp: 99 F (37.2 C) 98.1 F (36.7 C)  SpO2: 96% 98%    General: Alert awake oriented x3 Cardiovascular: S1-S2, Respiratory: Clear  Discharge Instructions   Discharge Instructions    Diet - low sodium heart healthy   Complete by: As directed    Increase activity slowly   Complete by: As directed      Allergies as of 12/07/2019      Reactions   Other Shortness Of Breath   PURPLE LETTUCE   Betaseron [interferon Beta-1b] Other (See Comments)   HARD NODULAR AREAS AT INJECTION SITE   Codeine Nausea And Vomiting   Gilenya [fingolimod Hydrochloride] Other (See Comments)   MACULAR EDEMA   Morphine And Related Other (See Comments)  IV ROUTE - RED STREAKS OF VEIN   Sumatriptan Nausea Only   INJECTABLE ONLY - RAPID HEART RATE, FLUSHING   Tysabri [natalizumab] Other (See Comments)   DEVELOPED JCV ANTIBODY    Latex Rash      Medication List    STOP taking these medications   cephALEXin 500 MG capsule Commonly known as: Keflex   losartan 25 MG tablet Commonly known as: COZAAR   pantoprazole 40 MG tablet Commonly known as: PROTONIX     TAKE these medications   4-Aminopyridine Powd Take 5 mg by mouth 3 (three) times daily. Pharmacy dispense 270 capsules with 3 refills. Thank you   Amantadine HCl 100 MG tablet Take 1 tablet (100 mg total) by mouth 3 (three) times daily.   amphetamine-dextroamphetamine 15 MG tablet Commonly known as: Adderall Take one po qAm and one po 4 hours later What changed:   how much to take  how to take this  when to take this   Biotin 10000 MCG Tabs Take 1 tablet by mouth daily.   calcium carbonate 600 MG Tabs tablet Commonly known as: OS-CAL Take 1,200 mg by mouth.   carvedilol 3.125 MG tablet Commonly known as: COREG Take 3.125 mg by mouth 2 (two) times daily with a meal.   colestipol 1 g tablet Commonly known as: COLESTID Take 2 tablets (2 g total) by mouth 2 (two) times daily.   denosumab 60 MG/ML Soln injection Commonly known as: PROLIA Inject 60 mg into the skin every 6 (six) months. Administer in upper arm, thigh, or abdomen   Dexilant 60 MG capsule Generic drug: dexlansoprazole Take 1 capsule by mouth 2 (two) times daily.   donepezil 10 MG tablet Commonly known as: ARICEPT Take 1 tablet (10 mg total) by mouth at bedtime.   furosemide 20 MG tablet Commonly known as: LASIX Take 1 tablet (20 mg total) by mouth every other day as needed for fluid or edema. What changed:   when to take this  reasons to take this   lamoTRIgine 150 MG tablet Commonly known as: LAMICTAL TAKE 1 TABLET BY MOUTH TWICE A DAY   loratadine 10 MG tablet Commonly known as: CLARITIN Take 10 mg by mouth daily.   Meclizine HCl 25 MG Chew Chew 2 tablets by mouth as needed (dizziness).   MULTIVITAMIN PO Take 1 tablet by mouth daily. Brand name -  Mature Multivitamin from [place] Take 1 tablet by mouth every morning.   potassium chloride 10 MEQ tablet Commonly known as: KLOR-CON Take 10 mEq by mouth daily.   rOPINIRole 0.25 MG tablet Commonly known as: REQUIP TAKE 1 TO 2 TABLETS BY MOUTH AT NIGHT What changed:   how much to take  how to take this  when to take this  additional instructions   Vitamin D (Ergocalciferol) 1.25 MG (50000 UT) Caps capsule Commonly known as: DRISDOL Take 1 capsule (50,000 Units total) by mouth every 7 (seven) days. When finished, take an otc daily vitamin d supplement of 5,000iu. What changed:   when to take this  additional instructions      Allergies  Allergen Reactions  . Other Shortness Of Breath    PURPLE LETTUCE  . Betaseron [Interferon Beta-1b] Other (See Comments)    HARD NODULAR AREAS AT INJECTION SITE  . Codeine Nausea And Vomiting  . Gilenya [Fingolimod Hydrochloride] Other (See Comments)    MACULAR EDEMA  . Morphine And Related Other (See Comments)  IV ROUTE - RED STREAKS OF VEIN  . Sumatriptan Nausea Only    INJECTABLE ONLY - RAPID HEART RATE, FLUSHING  . Tysabri [Natalizumab] Other (See Comments)    DEVELOPED JCV ANTIBODY  . Latex Rash   Follow-up Information    Clarene Essex, MD. Schedule an appointment as soon as possible for a visit in 2 week(s).   Specialty: Gastroenterology Why: Chronic diarrhea Contact information: [address] [place] [address] [phone]        Vickii Chafe., DO. Schedule an appointment as soon as possible for a visit in 1 month(s).   Specialty: Cardiology Contact information: [address] [place] [address] [phone]            The results of significant diagnostics from this hospitalization (including imaging, microbiology, ancillary and laboratory) are listed below for reference.    Significant Diagnostic Studies: DG Chest Port 1 View  Result  Date: 12/04/2019 CLINICAL DATA:  Weakness and diarrhea for 4 days. Possible CHF. EXAM: PORTABLE CHEST 1 VIEW COMPARISON:  Chest radiograph 04/25/2019. CT 05/17/2016 FINDINGS: The cardiomediastinal contours are normal. Minimal biapical pleuroparenchymal scarring. Subsegmental atelectasis at the left lung base. Pulmonary vasculature is normal. No consolidation, pleural effusion, or pneumothorax. No acute osseous abnormalities are seen. IMPRESSION: Subsegmental atelectasis at the left lung base. Otherwise no acute abnormality. No evidence of CHF. Electronically Signed   By: Keith Rake M.D.   On: 12/04/2019 22:10    Microbiology: Recent Results (from the past 240 hour(s))  SARS CORONAVIRUS 2 (TAT 6-24 HRS) Nasopharyngeal Nasopharyngeal Swab     Status: None   Collection Time: 12/05/19  1:50 AM   Specimen: Nasopharyngeal Swab  Result Value Ref Range Status   SARS Coronavirus 2 NEGATIVE NEGATIVE Final    Comment: (NOTE) SARS-CoV-2 target nucleic acids are NOT DETECTED. The SARS-CoV-2 RNA is generally detectable in upper and lower respiratory specimens during the acute phase of infection. Negative results do not preclude SARS-CoV-2 infection, do not rule out co-infections with other pathogens, and should not be used as the sole basis for treatment or other patient management decisions. Negative results must be combined with clinical observations, patient history, and epidemiological information. The expected result is Negative. Fact Sheet for Patients: SugarRoll.be Fact Sheet for Healthcare Providers: https://www.woods-mathews.com/ This test is not yet approved or cleared by the [place] FDA and  has been authorized for detection and/or diagnosis of SARS-CoV-2 by FDA under an Emergency Use Authorization (EUA). This EUA will remain  in effect (meaning this test can be used) for the duration of the COVID-19 declaration under Section 56 4(b)(1) of  the Act, 21 U.S.C. section 360bbb-3(b)(1), unless the authorization is terminated or revoked sooner. Performed at [facility], [place] [address] [place], [place] [address]   C difficile quick scan w PCR reflex     Status: None   Collection Time: 12/05/19  8:11 PM   Specimen: Stool  Result Value Ref Range Status   C Diff antigen NEGATIVE NEGATIVE Final   C Diff toxin NEGATIVE NEGATIVE Final   C Diff interpretation No C. difficile detected.  Final    Comment: Performed at [facility], [place] [address], [place],  [address]     Labs: Basic Metabolic Panel: Recent Labs  Lab 12/04/19 1633 12/04/19 1956 12/05/19 0354 12/07/19 0559  NA 140  --  143 140  K 3.4*  --  3.1* 4.1  CL 103  --  111 105  CO2 28  --  24 27  GLUCOSE 110*  --  83 101*  BUN 15  --  13 5*  CREATININE 1.36*  --  0.91 0.94  CALCIUM 8.9  --  7.6* 8.8*  MG  --  1.9  --   --    Liver Function Tests: Recent Labs  Lab 12/04/19 1633 12/05/19 0354  AST 16 13*  ALT 16 13  ALKPHOS 84 67  BILITOT 0.4 0.2*  PROT 6.9 5.3*  ALBUMIN 4.0 3.1*   Recent Labs  Lab 12/04/19 1633  LIPASE 33   No results for input(s): AMMONIA in the last 168 hours. CBC: Recent Labs  Lab 12/04/19 1633 12/05/19 0354  WBC 8.8 7.0  HGB 15.1* 12.9  HCT 45.5 40.0  MCV 92.9 95.2  PLT 292 226   Cardiac Enzymes: No results for input(s): CKTOTAL, CKMB, CKMBINDEX, TROPONINI in the last 168 hours. BNP: BNP (last 3 results) Recent Labs    12/04/19 1956  BNP 515.9*    ProBNP (last 3 results) No results for input(s): PROBNP in the last 8760 hours.  CBG: No results for input(s): GLUCAP in the last 168 hours.     Signed:  Domenic Polite MD.  [facility] 12/09/2019, 2:15 PM

## 2019-12-10 LAB — GI PATHOGEN PANEL BY PCR, STOOL

## 2019-12-14 DIAGNOSIS — E876 Hypokalemia: Secondary | ICD-10-CM | POA: Diagnosis not present

## 2019-12-14 DIAGNOSIS — G35 Multiple sclerosis: Secondary | ICD-10-CM | POA: Diagnosis not present

## 2019-12-14 DIAGNOSIS — I213 ST elevation (STEMI) myocardial infarction of unspecified site: Secondary | ICD-10-CM | POA: Diagnosis not present

## 2019-12-14 DIAGNOSIS — M3500 Sicca syndrome, unspecified: Secondary | ICD-10-CM | POA: Diagnosis not present

## 2019-12-14 DIAGNOSIS — G373 Acute transverse myelitis in demyelinating disease of central nervous system: Secondary | ICD-10-CM | POA: Diagnosis not present

## 2019-12-14 DIAGNOSIS — M810 Age-related osteoporosis without current pathological fracture: Secondary | ICD-10-CM | POA: Diagnosis not present

## 2019-12-14 DIAGNOSIS — K219 Gastro-esophageal reflux disease without esophagitis: Secondary | ICD-10-CM | POA: Diagnosis not present

## 2019-12-14 DIAGNOSIS — R197 Diarrhea, unspecified: Secondary | ICD-10-CM | POA: Diagnosis not present

## 2019-12-14 DIAGNOSIS — R110 Nausea: Secondary | ICD-10-CM | POA: Diagnosis not present

## 2019-12-14 DIAGNOSIS — G609 Hereditary and idiopathic neuropathy, unspecified: Secondary | ICD-10-CM | POA: Diagnosis not present

## 2019-12-14 DIAGNOSIS — R2689 Other abnormalities of gait and mobility: Secondary | ICD-10-CM | POA: Diagnosis not present

## 2019-12-14 DIAGNOSIS — N1831 Chronic kidney disease, stage 3a: Secondary | ICD-10-CM | POA: Diagnosis not present

## 2019-12-18 NOTE — Progress Notes (Signed)
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 Value Date   VITAMINB12 499 03/14/2019   Lab Results  Component Value Date   TSH 1.899 10/31/2019    ASSESSMENT AND PLAN 68 y.o. year old female  has a past medical history of Arthritis, Asthma, Dysesthesia, Dyspnea, Family history of adverse reaction to anesthesia, Fatigue, Fecal incontinence, Frequent falls, GERD (gastroesophageal reflux disease), Heart murmur, History of colon polyps, History of hiatal hernia, History of neoplasm, Migraines, Mitral valve prolapse, Multiple sclerosis ([place]) (1989), Myocarditis ([place]) (04/25/2019), Neurogenic bladder, Osteoporosis, Pneumonia, PONV (postoperative nausea and vomiting), Pulmonary nodule, right, Short of breath on exertion, Sjogren's disease ([place]), Spastic gait, Transverse myelitis ([place]), and Wears bilateral ankle braces. here with     ICD-10-CM   1. Transverse myelitis ([place])  G37.3 Ambulatory referral to Physical Therapy  2. Spastic gait  R26.1 Ambulatory referral to Physical Therapy  3. Weakness of both lower extremities  R29.898 Ambulatory referral to Physical Therapy  4. Fall, initial encounter   W19.XXXA Ambulatory referral to Physical Therapy  5. Dysesthesia  R20.8   6. Other fatigue  R53.83   7. Memory loss  R41.3   8. Urinary frequency  R35.0   9. Attention deficit disorder, unspecified hyperactivity presence  F98.8     Overall Sharada is fairly stable from a neurological perspective.  She is recovering nicely from recent hospitalization due to dehydration.  Diarrhea has improved significantly.  She is monitoring blood pressures at home and reports drinking at least 60 ounces of water daily.  We will continue current treatment plan.  I will refill Adderall today as requested.  We have discussed side effects of all medications in detail.  Advised to monitor symptoms for improvement with each medication.  We have discussed concerns of falls.  Gait is spastic in the office.  I suggested that she consider using a Rollator which could offer more stability with turns.  I will also refer her to physical therapy for evaluation and treatment as appropriate.  She will continue close follow-up with primary care and gastroenterology as advised.  She will follow-up with Dr. Ladonna Snide in 6 to 8 months, sooner if needed.  She verbalizes understanding and agreement with this plan.   Orders Placed This Encounter  Procedures  . Ambulatory referral to Physical Therapy    Referral Priority:   Routine    Referral Type:   Physical Medicine    Referral Reason:   Specialty Services Required    Requested Specialty:   Physical Therapy    Number of Visits Requested:   1     No orders of the defined types were placed in this encounter.      Debbora Presto, FNP-C 12/19/2019, 10:19 AM [facility] [address], [place], [place] [address] [phone]

## 2019-12-19 ENCOUNTER — Ambulatory Visit: Payer: PPO | Admitting: Family Medicine

## 2019-12-19 ENCOUNTER — Other Ambulatory Visit: Payer: Self-pay

## 2019-12-19 ENCOUNTER — Encounter: Payer: Self-pay | Admitting: Family Medicine

## 2019-12-19 VITALS — BP 105/60 | HR 86 | Temp 97.3°F | Ht 70.0 in | Wt 153.8 lb

## 2019-12-19 DIAGNOSIS — R413 Other amnesia: Secondary | ICD-10-CM

## 2019-12-19 DIAGNOSIS — G373 Acute transverse myelitis in demyelinating disease of central nervous system: Secondary | ICD-10-CM

## 2019-12-19 DIAGNOSIS — F988 Other specified behavioral and emotional disorders with onset usually occurring in childhood and adolescence: Secondary | ICD-10-CM

## 2019-12-19 DIAGNOSIS — R5383 Other fatigue: Secondary | ICD-10-CM

## 2019-12-19 DIAGNOSIS — R350 Frequency of micturition: Secondary | ICD-10-CM

## 2019-12-19 DIAGNOSIS — R261 Paralytic gait: Secondary | ICD-10-CM | POA: Diagnosis not present

## 2019-12-19 DIAGNOSIS — R29898 Other symptoms and signs involving the musculoskeletal system: Secondary | ICD-10-CM

## 2019-12-19 DIAGNOSIS — W19XXXA Unspecified fall, initial encounter: Secondary | ICD-10-CM

## 2019-12-19 DIAGNOSIS — R208 Other disturbances of skin sensation: Secondary | ICD-10-CM

## 2019-12-19 MED ORDER — AMPHETAMINE-DEXTROAMPHETAMINE 15 MG PO TABS: ORAL_TABLET | ORAL | 0 refills | Status: DC

## 2019-12-19 NOTE — Progress Notes (Signed)
I have read the note, and I agree with the clinical assessment and plan.  Terence Googe A. Dalylah Ramey, MD, PhD, FAAN Certified in Neurology, Clinical Neurophysiology, Sleep Medicine, Pain Medicine and Neuroimaging  [facility] [address], [address] [place], [place] [address] [phone]  

## 2019-12-19 NOTE — Patient Instructions (Addendum)
We will continue current treatment plan. PT referral for recent falls and lower extremity weakness. Continue to exercise daily. Healthy well balanced diet advised.   Follow up with Dr Felecia Shelling in 6-8 months  Transverse Myelitis  Transverse myelitis is a condition that causes inflammation of the spinal cord. The inflammation affects the fatty lining that covers spinal cord nerves (myelin). It can cause scarring of nerves, which can interfere with nerve signals passing to and from the spinal cord. Signs and symptoms of this condition happen at the affected level of the spinal cord and below. The condition most often causes weakness of the arms or legs, pain, changes in feeling (sensation) in the arms or legs, and bowel and bladder problems. What are the causes? The exact cause of this condition is not known. It sometimes develops after a viral infection, such as herpes, chicken pox, cytomegalovirus, HIV, or Epstein-Barr virus. It can also occur after a bacterial infection or along with diseases that make the body's defense system (immune system) mistakenly attack healthy tissues (autoimmune diseases). What increases the risk? You are more likely to develop this condition if you have an autoimmune disease, such as multiple sclerosis, neuromyelitis optica, or lupus. What are the signs or symptoms? Symptoms of this condition may start suddenly within hours or develop gradually over weeks. Symptoms include:  Pain, especially in the neck, chest, or back, with shooting pains into the legs.  Weakness of the arms or the legs.  Difficulty walking, including foot dragging and stumbling.  Abnormal sensations, such as burning, prickling, numbness, or tingling, in the arms or legs.  Increased sensitivity to touch or changes in temperature.  Bowel and bladder problems, including an increased need to go, loss of control, and difficulty going to the bathroom. Other symptoms  include:  Fatigue.  Fever.  Loss of appetite.  Headache.  Difficulty breathing.  Paralysis. How is this diagnosed? This condition may be diagnosed with a neurological exam. During this exam, your health care provider will ask about your symptoms and do a complete physical exam to check your spinal cord function. You may need to see a nervous system specialist (neurologist) to have tests. Tests may include:  An MRI to check for inflammation or scarring in the spinal cord.  Blood tests to check for: ? Infections that can trigger this condition. ? Neuromyelitis optica.  A lumbar puncture to check your spinal fluid for signs of infection or inflammation. For this procedure, a small amount of the fluid that surrounds the brain and spinal cord is removed and examined. How is this treated? There is no cure for this condition. You may have treatment to reduce inflammation and manage symptoms. Often, treatment and monitoring are needed in the hospital setting. This condition may be treated with:  Pain medicine.  Corticosteroid medicines to reduce inflammation. These are usually given through an IV at first. Later, they may be taken by mouth.  Breathing support with a device called a respirator.  Physical therapy to: ? Reduce the risk of bedsores. ? Improve muscle strength, flexibility, coordination, and range of motion in affected muscles. ? Reduce muscle spasms and muscle wasting in paralyzed arms or legs. ? Improve control over your bladder and bowel.  Occupational therapy. This therapy helps you learn how to care for yourself and do everyday tasks such as bathing and dressing. It cannot reverse problems caused by this condition, but it can help you become as independent as possible. Follow these instructions at home:  Take  over-the-counter and prescription medicines only as told by your health care provider.  Rest at home as told by your health care provider until you start to  recover strength and movement. Ask your health care provider what activities are safe for you.  Do exercises as told by your health care provider.  Keep all follow-up visits as told by your health care provider. This is important. Contact a health care provider if:  Your symptoms are not improving or are getting worse.  You have symptoms that come back after going away.  You are having a hard time managing at home. Get help right away if you:  Cannot care for yourself at home.  Have trouble breathing. Summary  Transverse myelitis is a condition that causes inflammation of the spinal cord. Signs and symptoms of this condition happen at the affected level of the spinal cord and below.  There is no cure for this condition. You may have treatment in the hospital setting to reduce inflammation and manage symptoms.  Treatment may include pain medicine, corticosteroid medicines, breathing support with a respirator, physical therapy, and occupational therapy.  Rest at home as told by your health care provider until you start to recover strength and movement. Ask your health care provider what activities are safe for you.  Keep all follow-up visits as told by your health care provider. This is important. This information is not intended to replace advice given to you by your health care provider. Make sure you discuss any questions you have with your health care provider. Document Released: 12/03/2002 Document Revised: 11/09/2018 Document Reviewed: 11/09/2018 Elsevier Patient Education  [employer].    Fatigue If you have fatigue, you feel tired all the time and have a lack of energy or a lack of motivation. Fatigue may make it difficult to start or complete tasks because of exhaustion. In general, occasional or mild fatigue is often a normal response to activity or life. However, long-lasting (chronic) or extreme fatigue may be a symptom of a medical condition. Follow these  instructions at home: General instructions  Watch your fatigue for any changes.  Go to bed and get up at the same time every day.  Avoid fatigue by pacing yourself during the day and getting enough sleep at night.  Maintain a healthy weight. Medicines  Take over-the-counter and prescription medicines only as told by your health care provider.  Take a multivitamin, if told by your health care provider.  Do not use herbal or dietary supplements unless they are approved by your health care provider. Activity   Exercise regularly, as told by your health care provider.  Use or practice techniques to help you relax, such as yoga, tai chi, meditation, or massage therapy. Eating and drinking   Avoid heavy meals in the evening.  Eat a well-balanced diet, which includes lean proteins, whole grains, plenty of fruits and vegetables, and low-fat dairy products.  Avoid consuming too much caffeine.  Avoid the use of alcohol.  Drink enough fluid to keep your urine pale yellow. Lifestyle  Change situations that cause you stress. Try to keep your work and personal schedule in balance.  Do not use any products that contain nicotine or tobacco, such as cigarettes and e-cigarettes. If you need help quitting, ask your health care provider.  Do not use drugs. Contact a health care provider if:  Your fatigue does not get better.  You have a fever.  You suddenly lose or gain weight.  You have  headaches.  You have trouble falling asleep or sleeping through the night.  You feel angry, guilty, anxious, or sad.  You are unable to have a bowel movement (constipation).  Your skin is dry.  You have swelling in your legs or another part of your body. Get help right away if:  You feel confused.  Your vision is blurry.  You feel faint or you pass out.  You have a severe headache.  You have severe pain in your abdomen, your back, or the area between your waist and hips  (pelvis).  You have chest pain, shortness of breath, or an irregular or fast heartbeat.  You are unable to urinate, or you urinate less than normal.  You have abnormal bleeding, such as bleeding from the rectum, vagina, nose, lungs, or nipples.  You vomit blood.  You have thoughts about hurting yourself or others. If you ever feel like you may hurt yourself or others, or have thoughts about taking your own life, get help right away. You can go to your nearest emergency department or call:  Your local emergency services (911 in the [place]  A suicide crisis helpline, such as the [facility] at 276-261-0798. This is open 24 hours a day. Summary  If you have fatigue, you feel tired all the time and have a lack of energy or a lack of motivation.  Fatigue may make it difficult to start or complete tasks because of exhaustion.  Long-lasting (chronic) or extreme fatigue may be a symptom of a medical condition.  Exercise regularly, as told by your health care provider.  Change situations that cause you stress. Try to keep your work and personal schedule in balance. This information is not intended to replace advice given to you by your health care provider. Make sure you discuss any questions you have with your health care provider. Document Released: 10/10/2007 Document Revised: 04/05/2019 Document Reviewed: 09/07/2017 Elsevier Patient Education  [employer].    It is important to avoid accidents which may result in broken bones.  Here are a few ideas on how to make your home safer so you will be less likely to trip or fall.  1. Use nonskid mats or non slip strips in your shower or tub, on your bathroom floor and around sinks.  If you know that you have spilled water, wipe it up! 2. In the bathroom, it is important to have properly installed grab bars on the walls or on the edge of the tub.  Towel racks are NOT strong enough for you to hold onto or to  pull on for support. 3. Stairs and hallways should have enough light.  Add lamps or night lights if you need ore light. 4. It is good to have handrails on both sides of the stairs if possible.  Always fix broken handrails right away. 5. It is important to see the edges of steps.  Paint the edges of outdoor steps white so you can see them better.  Put colored tape on the edge of inside steps. 6. Throw-rugs are dangerous because they can slide.  Removing the rugs is the best idea, but if they must stay, add adhesive carpet tape to prevent slipping. 7. Do not keep things on stairs or in the halls.  Remove small furniture that blocks the halls as it may cause you to trip.  Keep telephone and electrical cords out of the way where you walk. 8. Always were sturdy, rubber-soled shoes for good  support.  Never wear just socks, especially on the stairs.  Socks may cause you to slip or fall.  Do not wear full-length housecoats as you can easily trip on the bottom.  9. Place the things you use the most on the shelves that are the easiest to reach.  If you use a stepstool, make sure it is in good condition.  If you feel unsteady, DO NOT climb, ask for help. 10. If a health professional advises you to use a cane or walker, do not be ashamed.  These items can keep you from falling and breaking your bones.

## 2019-12-31 DIAGNOSIS — I401 Isolated myocarditis: Secondary | ICD-10-CM | POA: Diagnosis not present

## 2020-01-01 DIAGNOSIS — M5412 Radiculopathy, cervical region: Secondary | ICD-10-CM | POA: Diagnosis not present

## 2020-01-01 DIAGNOSIS — M542 Cervicalgia: Secondary | ICD-10-CM | POA: Diagnosis not present

## 2020-01-01 DIAGNOSIS — S129XXA Fracture of neck, unspecified, initial encounter: Secondary | ICD-10-CM | POA: Diagnosis not present

## 2020-01-01 DIAGNOSIS — Z981 Arthrodesis status: Secondary | ICD-10-CM | POA: Diagnosis not present

## 2020-01-03 ENCOUNTER — Ambulatory Visit: Payer: PPO | Admitting: Physical Therapy

## 2020-01-09 ENCOUNTER — Telehealth: Payer: Self-pay | Admitting: Family Medicine

## 2020-01-09 DIAGNOSIS — K2100 Gastro-esophageal reflux disease with esophagitis, without bleeding: Secondary | ICD-10-CM | POA: Diagnosis not present

## 2020-01-09 DIAGNOSIS — K594 Anal spasm: Secondary | ICD-10-CM | POA: Diagnosis not present

## 2020-01-09 NOTE — Telephone Encounter (Signed)
Patient called in and stated her GI doctor wants to know if it is ok to prescribe her Reglan at a low does.  CB# [phone]

## 2020-01-10 DIAGNOSIS — H02139 Senile ectropion of unspecified eye, unspecified eyelid: Secondary | ICD-10-CM | POA: Diagnosis not present

## 2020-01-10 DIAGNOSIS — H04553 Acquired stenosis of bilateral nasolacrimal duct: Secondary | ICD-10-CM | POA: Diagnosis not present

## 2020-01-10 DIAGNOSIS — H04129 Dry eye syndrome of unspecified lacrimal gland: Secondary | ICD-10-CM | POA: Diagnosis not present

## 2020-01-10 DIAGNOSIS — H04551 Acquired stenosis of right nasolacrimal duct: Secondary | ICD-10-CM | POA: Diagnosis not present

## 2020-01-10 DIAGNOSIS — H04552 Acquired stenosis of left nasolacrimal duct: Secondary | ICD-10-CM | POA: Diagnosis not present

## 2020-01-10 DIAGNOSIS — H04542 Stenosis of left lacrimal canaliculi: Secondary | ICD-10-CM | POA: Diagnosis not present

## 2020-01-10 DIAGNOSIS — J310 Chronic rhinitis: Secondary | ICD-10-CM | POA: Diagnosis not present

## 2020-01-10 DIAGNOSIS — H04222 Epiphora due to insufficient drainage, left lacrimal gland: Secondary | ICD-10-CM | POA: Diagnosis not present

## 2020-01-10 NOTE — Telephone Encounter (Signed)
Reglan is most concerning for tardive dyskinesia but can also cause dizziness, grogginess, etc. I feel that low doses for the shortest course appropriate are probably fine from a neuro perspective. I would just have patient monitor closely for any new or worsening symptoms.

## 2020-01-10 NOTE — Telephone Encounter (Signed)
Left detailed VM advising patient of NP's reply and recommendations. Left # for questions.

## 2020-01-11 DIAGNOSIS — Z1231 Encounter for screening mammogram for malignant neoplasm of breast: Secondary | ICD-10-CM | POA: Diagnosis not present

## 2020-01-23 DIAGNOSIS — J3489 Other specified disorders of nose and nasal sinuses: Secondary | ICD-10-CM | POA: Insufficient documentation

## 2020-01-23 DIAGNOSIS — H04412 Chronic dacryocystitis of left lacrimal passage: Secondary | ICD-10-CM | POA: Insufficient documentation

## 2020-01-23 DIAGNOSIS — J342 Deviated nasal septum: Secondary | ICD-10-CM | POA: Insufficient documentation

## 2020-01-23 DIAGNOSIS — J310 Chronic rhinitis: Secondary | ICD-10-CM | POA: Diagnosis not present

## 2020-01-23 DIAGNOSIS — J343 Hypertrophy of nasal turbinates: Secondary | ICD-10-CM | POA: Insufficient documentation

## 2020-01-23 HISTORY — DX: Deviated nasal septum: J34.2

## 2020-01-28 DIAGNOSIS — G373 Acute transverse myelitis in demyelinating disease of central nervous system: Secondary | ICD-10-CM | POA: Diagnosis not present

## 2020-01-28 DIAGNOSIS — M810 Age-related osteoporosis without current pathological fracture: Secondary | ICD-10-CM | POA: Diagnosis not present

## 2020-01-28 DIAGNOSIS — K219 Gastro-esophageal reflux disease without esophagitis: Secondary | ICD-10-CM | POA: Diagnosis not present

## 2020-01-28 DIAGNOSIS — G609 Hereditary and idiopathic neuropathy, unspecified: Secondary | ICD-10-CM | POA: Diagnosis not present

## 2020-01-28 DIAGNOSIS — E876 Hypokalemia: Secondary | ICD-10-CM | POA: Diagnosis not present

## 2020-01-28 DIAGNOSIS — K529 Noninfective gastroenteritis and colitis, unspecified: Secondary | ICD-10-CM | POA: Diagnosis not present

## 2020-01-28 DIAGNOSIS — N1831 Chronic kidney disease, stage 3a: Secondary | ICD-10-CM | POA: Diagnosis not present

## 2020-01-28 DIAGNOSIS — R05 Cough: Secondary | ICD-10-CM | POA: Diagnosis not present

## 2020-01-28 DIAGNOSIS — I10 Essential (primary) hypertension: Secondary | ICD-10-CM | POA: Diagnosis not present

## 2020-01-28 DIAGNOSIS — G35 Multiple sclerosis: Secondary | ICD-10-CM | POA: Diagnosis not present

## 2020-01-28 DIAGNOSIS — R2689 Other abnormalities of gait and mobility: Secondary | ICD-10-CM | POA: Diagnosis not present

## 2020-01-28 DIAGNOSIS — R413 Other amnesia: Secondary | ICD-10-CM | POA: Diagnosis not present

## 2020-02-18 DIAGNOSIS — A09 Infectious gastroenteritis and colitis, unspecified: Secondary | ICD-10-CM | POA: Diagnosis not present

## 2020-02-18 DIAGNOSIS — K2100 Gastro-esophageal reflux disease with esophagitis, without bleeding: Secondary | ICD-10-CM | POA: Diagnosis not present

## 2020-02-19 ENCOUNTER — Other Ambulatory Visit: Payer: Self-pay | Admitting: Family Medicine

## 2020-02-19 MED ORDER — AMPHETAMINE-DEXTROAMPHETAMINE 15 MG PO TABS: ORAL_TABLET | ORAL | 0 refills | Status: DC

## 2020-02-19 NOTE — Telephone Encounter (Signed)
Pt is needing a refill on her amphetamine-dextroamphetamine (ADDERALL) 15 MG tablet sent in to [facility] [place] on [address]

## 2020-03-11 DIAGNOSIS — D171 Benign lipomatous neoplasm of skin and subcutaneous tissue of trunk: Secondary | ICD-10-CM | POA: Diagnosis not present

## 2020-03-11 DIAGNOSIS — B078 Other viral warts: Secondary | ICD-10-CM | POA: Diagnosis not present

## 2020-03-11 DIAGNOSIS — L853 Xerosis cutis: Secondary | ICD-10-CM | POA: Diagnosis not present

## 2020-03-11 DIAGNOSIS — L820 Inflamed seborrheic keratosis: Secondary | ICD-10-CM | POA: Diagnosis not present

## 2020-03-11 DIAGNOSIS — L821 Other seborrheic keratosis: Secondary | ICD-10-CM | POA: Diagnosis not present

## 2020-03-11 DIAGNOSIS — D1801 Hemangioma of skin and subcutaneous tissue: Secondary | ICD-10-CM | POA: Diagnosis not present

## 2020-03-11 DIAGNOSIS — L814 Other melanin hyperpigmentation: Secondary | ICD-10-CM | POA: Diagnosis not present

## 2020-03-11 DIAGNOSIS — L718 Other rosacea: Secondary | ICD-10-CM | POA: Diagnosis not present

## 2020-03-11 DIAGNOSIS — D2262 Melanocytic nevi of left upper limb, including shoulder: Secondary | ICD-10-CM | POA: Diagnosis not present

## 2020-03-11 DIAGNOSIS — L218 Other seborrheic dermatitis: Secondary | ICD-10-CM | POA: Diagnosis not present

## 2020-03-11 DIAGNOSIS — Z85828 Personal history of other malignant neoplasm of skin: Secondary | ICD-10-CM | POA: Diagnosis not present

## 2020-03-13 ENCOUNTER — Other Ambulatory Visit (HOSPITAL_COMMUNITY): Payer: Self-pay | Admitting: *Deleted

## 2020-03-14 ENCOUNTER — Ambulatory Visit (HOSPITAL_COMMUNITY)
Admission: RE | Admit: 2020-03-14 | Discharge: 2020-03-14 | Disposition: A | Discharge status: Home / Self Care | Payer: PPO | Source: Ambulatory Visit | Attending: Internal Medicine | Admitting: Internal Medicine

## 2020-03-14 ENCOUNTER — Other Ambulatory Visit: Payer: Self-pay

## 2020-03-14 DIAGNOSIS — M810 Age-related osteoporosis without current pathological fracture: Secondary | ICD-10-CM | POA: Diagnosis not present

## 2020-03-14 MED ORDER — DENOSUMAB 60 MG/ML ~~LOC~~ SOSY
PREFILLED_SYRINGE | SUBCUTANEOUS | Status: AC
  Filled 2020-03-14: qty 1

## 2020-03-14 MED ORDER — DENOSUMAB 60 MG/ML ~~LOC~~ SOSY
60.0000 mg | PREFILLED_SYRINGE | Freq: Once | SUBCUTANEOUS | Status: AC
  Administered 2020-03-14: 60 mg via SUBCUTANEOUS

## 2020-03-17 ENCOUNTER — Ambulatory Visit: Payer: Self-pay | Admitting: Family Medicine

## 2020-03-17 DIAGNOSIS — H04552 Acquired stenosis of left nasolacrimal duct: Secondary | ICD-10-CM | POA: Diagnosis not present

## 2020-03-17 DIAGNOSIS — H04553 Acquired stenosis of bilateral nasolacrimal duct: Secondary | ICD-10-CM | POA: Diagnosis not present

## 2020-03-17 DIAGNOSIS — J310 Chronic rhinitis: Secondary | ICD-10-CM | POA: Diagnosis not present

## 2020-03-17 DIAGNOSIS — H04542 Stenosis of left lacrimal canaliculi: Secondary | ICD-10-CM | POA: Diagnosis not present

## 2020-03-17 DIAGNOSIS — H02814 Retained foreign body in left upper eyelid: Secondary | ICD-10-CM | POA: Diagnosis not present

## 2020-03-17 DIAGNOSIS — H02139 Senile ectropion of unspecified eye, unspecified eyelid: Secondary | ICD-10-CM | POA: Diagnosis not present

## 2020-03-17 DIAGNOSIS — H04222 Epiphora due to insufficient drainage, left lacrimal gland: Secondary | ICD-10-CM | POA: Diagnosis not present

## 2020-03-17 DIAGNOSIS — H04129 Dry eye syndrome of unspecified lacrimal gland: Secondary | ICD-10-CM | POA: Diagnosis not present

## 2020-03-17 DIAGNOSIS — H04551 Acquired stenosis of right nasolacrimal duct: Secondary | ICD-10-CM | POA: Diagnosis not present

## 2020-03-21 DIAGNOSIS — R490 Dysphonia: Secondary | ICD-10-CM | POA: Diagnosis not present

## 2020-03-21 DIAGNOSIS — J019 Acute sinusitis, unspecified: Secondary | ICD-10-CM | POA: Diagnosis not present

## 2020-03-21 DIAGNOSIS — R05 Cough: Secondary | ICD-10-CM | POA: Diagnosis not present

## 2020-03-21 DIAGNOSIS — Z1152 Encounter for screening for COVID-19: Secondary | ICD-10-CM | POA: Diagnosis not present

## 2020-03-24 ENCOUNTER — Telehealth: Payer: Self-pay | Admitting: Family Medicine

## 2020-03-24 NOTE — Telephone Encounter (Signed)
1) Medication(s) Requested (by name): amphetamine-dextroamphetamine (ADDERALL) 15 MG tablet  2) [facility]: [id number] [place], [address]  [place], [place] [address]

## 2020-03-25 MED ORDER — AMPHETAMINE-DEXTROAMPHETAMINE 15 MG PO TABS: ORAL_TABLET | ORAL | 0 refills | Status: DC

## 2020-03-26 ENCOUNTER — Other Ambulatory Visit: Payer: Self-pay | Admitting: Neurology

## 2020-05-07 ENCOUNTER — Other Ambulatory Visit: Payer: Self-pay | Admitting: Family Medicine

## 2020-05-07 NOTE — Telephone Encounter (Signed)
Pt has called for a refill on her amphetamine-dextroamphetamine (ADDERALL) 15 MG tablet [facility] [address]  [phone]

## 2020-05-08 DIAGNOSIS — Z01818 Encounter for other preprocedural examination: Secondary | ICD-10-CM | POA: Diagnosis not present

## 2020-05-08 MED ORDER — AMPHETAMINE-DEXTROAMPHETAMINE 15 MG PO TABS: ORAL_TABLET | ORAL | 0 refills | Status: DC

## 2020-05-08 NOTE — Addendum Note (Signed)
Addended by: Brandon Melnick on: 05/08/2020 07:37 AM   Modules accepted: Orders

## 2020-05-12 ENCOUNTER — Other Ambulatory Visit: Payer: Self-pay | Admitting: Ophthalmology

## 2020-05-12 DIAGNOSIS — H04552 Acquired stenosis of left nasolacrimal duct: Secondary | ICD-10-CM | POA: Diagnosis not present

## 2020-05-12 DIAGNOSIS — H02814 Retained foreign body in left upper eyelid: Secondary | ICD-10-CM | POA: Diagnosis not present

## 2020-05-12 DIAGNOSIS — H04129 Dry eye syndrome of unspecified lacrimal gland: Secondary | ICD-10-CM | POA: Diagnosis not present

## 2020-05-12 DIAGNOSIS — H04553 Acquired stenosis of bilateral nasolacrimal duct: Secondary | ICD-10-CM | POA: Diagnosis not present

## 2020-05-12 DIAGNOSIS — H04412 Chronic dacryocystitis of left lacrimal passage: Secondary | ICD-10-CM | POA: Diagnosis not present

## 2020-05-12 DIAGNOSIS — H04222 Epiphora due to insufficient drainage, left lacrimal gland: Secondary | ICD-10-CM | POA: Diagnosis not present

## 2020-05-12 DIAGNOSIS — J310 Chronic rhinitis: Secondary | ICD-10-CM | POA: Diagnosis not present

## 2020-05-12 DIAGNOSIS — H04542 Stenosis of left lacrimal canaliculi: Secondary | ICD-10-CM | POA: Diagnosis not present

## 2020-05-12 DIAGNOSIS — H02139 Senile ectropion of unspecified eye, unspecified eyelid: Secondary | ICD-10-CM | POA: Diagnosis not present

## 2020-05-12 DIAGNOSIS — H02815 Retained foreign body in left lower eyelid: Secondary | ICD-10-CM | POA: Diagnosis not present

## 2020-05-27 DIAGNOSIS — H04129 Dry eye syndrome of unspecified lacrimal gland: Secondary | ICD-10-CM | POA: Diagnosis not present

## 2020-05-27 DIAGNOSIS — H04222 Epiphora due to insufficient drainage, left lacrimal gland: Secondary | ICD-10-CM | POA: Diagnosis not present

## 2020-05-27 DIAGNOSIS — H04552 Acquired stenosis of left nasolacrimal duct: Secondary | ICD-10-CM | POA: Diagnosis not present

## 2020-05-27 DIAGNOSIS — H02139 Senile ectropion of unspecified eye, unspecified eyelid: Secondary | ICD-10-CM | POA: Diagnosis not present

## 2020-05-27 DIAGNOSIS — D485 Neoplasm of uncertain behavior of skin: Secondary | ICD-10-CM | POA: Diagnosis not present

## 2020-05-27 DIAGNOSIS — J310 Chronic rhinitis: Secondary | ICD-10-CM | POA: Diagnosis not present

## 2020-06-19 ENCOUNTER — Ambulatory Visit: Payer: PPO | Admitting: Neurology

## 2020-06-19 ENCOUNTER — Encounter: Payer: Self-pay | Admitting: Neurology

## 2020-06-19 ENCOUNTER — Other Ambulatory Visit: Payer: Self-pay

## 2020-06-19 VITALS — BP 126/79 | HR 94 | Ht 70.0 in | Wt 141.0 lb

## 2020-06-19 DIAGNOSIS — R413 Other amnesia: Secondary | ICD-10-CM

## 2020-06-19 DIAGNOSIS — R234 Changes in skin texture: Secondary | ICD-10-CM | POA: Diagnosis not present

## 2020-06-19 DIAGNOSIS — G373 Acute transverse myelitis in demyelinating disease of central nervous system: Secondary | ICD-10-CM | POA: Diagnosis not present

## 2020-06-19 DIAGNOSIS — F988 Other specified behavioral and emotional disorders with onset usually occurring in childhood and adolescence: Secondary | ICD-10-CM | POA: Diagnosis not present

## 2020-06-19 DIAGNOSIS — M25571 Pain in right ankle and joints of right foot: Secondary | ICD-10-CM | POA: Diagnosis not present

## 2020-06-19 DIAGNOSIS — R261 Paralytic gait: Secondary | ICD-10-CM | POA: Diagnosis not present

## 2020-06-19 DIAGNOSIS — M542 Cervicalgia: Secondary | ICD-10-CM | POA: Diagnosis not present

## 2020-06-19 DIAGNOSIS — M79671 Pain in right foot: Secondary | ICD-10-CM | POA: Diagnosis not present

## 2020-06-19 DIAGNOSIS — R5383 Other fatigue: Secondary | ICD-10-CM

## 2020-06-19 DIAGNOSIS — M25473 Effusion, unspecified ankle: Secondary | ICD-10-CM | POA: Diagnosis not present

## 2020-06-19 DIAGNOSIS — S81801A Unspecified open wound, right lower leg, initial encounter: Secondary | ICD-10-CM | POA: Diagnosis not present

## 2020-06-19 MED ORDER — LAMOTRIGINE 200 MG PO TABS: 200.0000 mg | ORAL_TABLET | Freq: Two times a day (BID) | ORAL | 3 refills | Status: DC

## 2020-06-19 NOTE — [facility] Notes (Signed)
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 better doing the interview than on MoCA test.  As I do not think she has MS, that can be an explanation.  She does not have any atrophy as would be seen with Alzheimer's.  For the time being, we will continue donepezil and continue Adderall 15 mg twice daily for ADHD.    Additionally, I am going to see we can get neuro cognitive testing with neuropsychiatry. 3.   Continue ropinirole for restless legs  4.   Use cane or walker for safety.   RTC 6 months, sooner if problems or based on the results.  45-minute office visit with the majority of the time spent face-to-face for history and physical, discussion/counseling and decision-making.  Additional time with record review and documentation.   Raghad Lorenz A. Felecia Shelling, MD, PhD 9/49/4473, 95:84 AM Certified in Neurology, Clinical Neurophysiology, Sleep Medicine, Pain Medicine and Neuroimaging  [facility] [address], [place], [place] [address] [phone]

## 2020-06-24 ENCOUNTER — Other Ambulatory Visit: Payer: Self-pay | Admitting: Neurology

## 2020-06-24 MED ORDER — AMPHETAMINE-DEXTROAMPHETAMINE 15 MG PO TABS: ORAL_TABLET | ORAL | 0 refills | Status: DC

## 2020-06-24 NOTE — Addendum Note (Signed)
Addended by: Verlin Grills on: 06/24/2020 01:12 PM   Modules accepted: Orders

## 2020-06-24 NOTE — Telephone Encounter (Signed)
Patient calling requesting a refill of amphetamine-dextroamphetamine (ADDERALL) 15 MG tablet

## 2020-06-24 NOTE — Telephone Encounter (Signed)
 drug registry has been verified. Last refill was # 60 for 30 day supply by AL, NP. PT has been complaint with f/us.   Dr. Felecia Shelling is out of the office, I will fwd to Amy, NP to see if she can refill during his absence.

## 2020-06-25 ENCOUNTER — Other Ambulatory Visit: Payer: Self-pay | Admitting: *Deleted

## 2020-06-25 MED ORDER — AMPHETAMINE-DEXTROAMPHETAMINE 15 MG PO TABS: ORAL_TABLET | ORAL | 0 refills | Status: DC

## 2020-06-25 NOTE — Telephone Encounter (Signed)
Received fax from [facility] re: to adderall, they received escript and they donot do this.  Redid to [facility].

## 2020-06-26 DIAGNOSIS — H04129 Dry eye syndrome of unspecified lacrimal gland: Secondary | ICD-10-CM | POA: Diagnosis not present

## 2020-06-26 DIAGNOSIS — D485 Neoplasm of uncertain behavior of skin: Secondary | ICD-10-CM | POA: Diagnosis not present

## 2020-06-26 DIAGNOSIS — L723 Sebaceous cyst: Secondary | ICD-10-CM | POA: Diagnosis not present

## 2020-06-26 DIAGNOSIS — J310 Chronic rhinitis: Secondary | ICD-10-CM | POA: Diagnosis not present

## 2020-06-26 DIAGNOSIS — H04552 Acquired stenosis of left nasolacrimal duct: Secondary | ICD-10-CM | POA: Diagnosis not present

## 2020-06-26 DIAGNOSIS — H04222 Epiphora due to insufficient drainage, left lacrimal gland: Secondary | ICD-10-CM | POA: Diagnosis not present

## 2020-06-26 DIAGNOSIS — H02139 Senile ectropion of unspecified eye, unspecified eyelid: Secondary | ICD-10-CM | POA: Diagnosis not present

## 2020-06-27 ENCOUNTER — Other Ambulatory Visit: Payer: Self-pay

## 2020-06-27 ENCOUNTER — Ambulatory Visit: Payer: PPO | Admitting: Psychology

## 2020-06-27 ENCOUNTER — Encounter: Payer: Self-pay | Admitting: Psychology

## 2020-06-27 ENCOUNTER — Ambulatory Visit (INDEPENDENT_AMBULATORY_CARE_PROVIDER_SITE_OTHER): Payer: PPO | Admitting: Psychology

## 2020-06-27 DIAGNOSIS — G35 Multiple sclerosis: Secondary | ICD-10-CM | POA: Diagnosis not present

## 2020-06-27 DIAGNOSIS — R4189 Other symptoms and signs involving cognitive functions and awareness: Secondary | ICD-10-CM

## 2020-06-27 NOTE — Patient Instructions (Signed)
Clinical Impression(s): The validity of neuropsychological testing is limited by the extent to which the individual being tested may be assumed to have exerted adequate effort during testing. Scores across stand-alone and embedded performance validity measures were variable. While this could be due to ongoing cognitive dysfunction, the results of the current evaluation should be interpreted with a degree of caution.  If taken at face value, [name]'s pattern of performance is suggestive of prominent deficits surrounding executive functioning and complex attention, with additional weaknesses across processing speed, semantic fluency, and encoding (i.e., learning) and retrieval aspects of memory. Performance was appropriate across basic attention, receptive language, phonemic fluency, confrontation naming, visuospatial abilities, and recognition/consolidation aspects of memory. [name] largely denied difficulties completing instrumental activities of daily living (ADLs) independently outside of occasionally being unsure if she took her medications earlier in the day. As such, given evidence for cognitive dysfunction described above, she meets criteria for a Mild Neurocognitive Disorder (formerly "mild cognitive impairment") at the present time.  The etiology of her weaknesses is somewhat unclear. Her patterns of performance are consistent with what would be expected in an individual with multiple sclerosis (MS). Traditional weaknesses in this condition focus on processing speed, attention/concentration, executive functioning, and encoding/retrieval aspects of memory. Despite this consistency, it remains very odd that previous neuroimaging has not suggested any brain lesions (brain MRI and head CTs in 2016 and 2020 respectively came back largely unremarkable) and the presence of oligoclonal bands seemingly resolved during a follow-up 2014 lumbar puncture. 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 However, as the inflammatory process in transverse myelitis is specific to the spinal cord and not the brain, neuropsychological dysfunction is not associated with this condition, meaning that this cannot explain the presence of cognitive deficits alone. Medical records do suggest a history of ADHD; however, [name] denied ever being diagnosed with this condition. If present, it is possible that symptoms of MS are exacerbating underlying attentional dysregulation, thus worsening her clinical presentation. Despite weaknesses in encoding/retrieval aspects of memory, performance across recognition trials was generally strong. This is not suggestive of a memory storage deficit and is inconsistent with Alzheimer's disease. Her behavioral and cognitive profile is also not consistent with other types of neurodegenerative illness (e.g., Lewy body or frontotemporal dementia) at the present time. Continued medical monitoring will be important moving forward.

## 2020-06-27 NOTE — Progress Notes (Signed)
   Psychometrician Note   Cognitive testing was administered to [name] by Milana Kidney, B.S. (psychometrist) under the supervision of Dr. Christia Reading, Ph.D., licensed psychologist on 06/27/20. Ms. Hegel did not appear overtly distressed by the testing session per behavioral observation or responses across self-report questionnaires. Dr. Christia Reading, Ph.D. checked in with Ms. Richens as needed to manage any distress related to testing procedures (if applicable). Rest breaks were offered.    The battery of tests administered was selected by Dr. Christia Reading, Ph.D. with consideration to Ms. Cadotte's current level of functioning, the nature of her symptoms, emotional and behavioral responses during interview, level of literacy, observed level of motivation/effort, and the nature of the referral question. This battery was communicated to the psychometrist. Communication between Dr. Christia Reading, Ph.D. and the psychometrist was ongoing throughout the evaluation and Dr. Christia Reading, Ph.D. was immediately accessible at all times. Dr. Christia Reading, Ph.D. provided supervision to the psychometrist on the date of this service to the extent necessary to assure the quality of all services provided.    Barbarann Ehlers Alban will return within approximately 1-2 weeks for an interactive feedback session with Dr. Melvyn Novas at which time her test performances, clinical impressions, and treatment recommendations will be reviewed in detail. Ms. Gathright understands she can contact our office should she require our assistance before this time.  A total of 150 minutes of billable time were spent face-to-face with Ms. Matos by the psychometrist. This includes both test administration and scoring time. Billing for these services is reflected in the clinical report generated by Dr. Christia Reading, Ph.D..  This note reflects time spent with the psychometrician and does not include test scores  or any clinical interpretations made by Dr. Melvyn Novas. The full report will follow in a separate note.

## 2020-06-27 NOTE — Progress Notes (Addendum)
NEUROPSYCHOLOGICAL EVALUATION [place] Department of Neurology  Date of Evaluation: June 27, 2020  Reason for Referral:   [name] is a 69 y.o. right-handed Caucasian female referred by Arlice Colt, M.D., to characterize her current cognitive functioning and assist with diagnostic clarity and treatment planning in the context of subjective cognitive decline and concerns surrounding transverse myelitis and/or multiple sclerosis.   Assessment and Plan:   Clinical Impression(s): The validity of neuropsychological testing is limited by the extent to which the individual being tested may be assumed to have exerted adequate effort during testing. Scores across stand-alone and embedded performance validity measures were variable. While this could be due to ongoing cognitive dysfunction, the results of the current evaluation should be interpreted with a degree of caution.  If taken at face value, [name]'s pattern of performance is suggestive of prominent deficits surrounding executive functioning and complex attention, with additional weaknesses across processing speed, semantic fluency, and encoding (i.e., learning) and retrieval aspects of memory. Performance was appropriate across basic attention, receptive language, phonemic fluency, confrontation naming, visuospatial abilities, and recognition/consolidation aspects of memory. [name] largely denied difficulties completing instrumental activities of daily living (ADLs) independently outside of occasionally being unsure if she took her medications earlier in the day. As such, given evidence for cognitive dysfunction described above, she meets criteria for a Mild Neurocognitive Disorder (formerly "mild cognitive impairment") at the present time.  The etiology of her weaknesses is somewhat unclear. Her patterns of performance are consistent with what would be expected in an individual with multiple  sclerosis (MS). Traditional weaknesses in this condition focus on processing speed, attention/concentration, executive functioning, and encoding/retrieval aspects of memory. Despite this consistency, it remains very odd that previous neuroimaging has not suggested any brain lesions (brain MRI and head CTs in 2016 and 2020 respectively came back largely unremarkable) and the presence of oligoclonal bands seemingly resolved during a follow-up 2014 lumbar puncture. It is important to note that transverse myelitis is also an inflammatory process which can be caused by MS. However, as the inflammatory process in transverse myelitis is specific to the spinal cord and not the brain, neuropsychological dysfunction is not associated with this condition, meaning that this cannot explain the presence of cognitive deficits alone. Medical records do suggest a history of ADHD; however, [name] denied ever being diagnosed with this condition. If present, it is possible that symptoms of MS are exacerbating underlying attentional dysregulation, thus worsening her clinical presentation. Despite weaknesses in encoding/retrieval aspects of memory, performance across recognition trials was generally strong. This is not suggestive of a memory storage deficit and is inconsistent with Alzheimer's disease. Her behavioral and cognitive profile is also not consistent with other types of neurodegenerative illness (e.g., Lewy body or frontotemporal dementia) at the present time. Continued medical monitoring will be important moving forward.   Recommendations: A repeat neuropsychological evaluation in 24 months (or sooner if functional decline is noted) is recommended to assess the trajectory of future cognitive decline should it occur. This will also aid in future efforts towards improved diagnostic clarity.  Unless there is a medical or other reason which would prevent this, updated neuroimaging in the form of a brain MRI would be  warranted. Per records available for me to review, her previous scan was completed in 2016. An updated study would allow for a better understanding of emerging brain lesions which are often an accompanying symptom of MS and potential correlates for cognitive dysfunction.  [name] described a medication management strategy involving flipping her pill bottles over or on their sides to represent when she has taken a medication. This seems unnecessarily complicated and prone to errors (e.g., if someone accidentally turns the bottle right side up in an effort to clean a space or a bottle on it side rolls off the counter). I would encourage to her purchase a pill box so that medication management efforts can be more structured and less prone to confusion.   Should there be a progression of her current deficits over time, [name] is unlikely to regain any independent living skills lost. Therefore, it is recommended that she remain as involved as possible in all aspects of household chores, finances, and medication management, with supervision to ensure adequate performance. She will likely benefit from the establishment and maintenance of a routine in order to maximize her functional abilities over time.  [name] is encouraged to attend to lifestyle factors for brain health (e.g., regular physical exercise, good nutrition habits, regular participation in cognitively-stimulating activities, and general stress management techniques), which are likely to have benefits for both emotional adjustment and cognition. In fact, in addition to promoting good general health, regular exercise incorporating aerobic activities (e.g., brisk walking, jogging, cycling, etc.) has been demonstrated to be a very effective treatment for depression and stress, with similar efficacy rates to both antidepressant medication and psychotherapy. Optimal control of vascular risk factors (including safe cardiovascular exercise and  adherence to dietary recommendations) is encouraged.  If interested, there are some activities which have therapeutic value and can be useful in keeping her cognitively stimulated. For suggestions, Ms. Kahan is encouraged to go to the following website: https://www.[facility].org/get-to-know-barrow/centers-programs/neurorehabilitation-center/neuro-rehab-apps-and-games/ which has options, categorized by level of difficulty. It should be noted that these activities should not be viewed as a substitute for therapy.  When learning new information, she would benefit from information being broken up into small, manageable pieces. She may also find it helpful to articulate the material in her own words and in a context to promote encoding at the onset of a new task. This material may need to be repeated multiple times to promote encoding.  Memory can be improved using internal strategies such as rehearsal, repetition, chunking, mnemonics, association, and imagery. External strategies such as written notes in a consistently used memory journal, visual and nonverbal auditory cues such as a calendar on the refrigerator or appointments with alarm, such as on a cell phone, can also help maximize recall.    To address problems with processing speed, she may wish to consider:   -Ensuring that she is alerted when essential material or instructions are being presented   -Adjusting the speed at which new information is presented   -Allowing for more time in comprehending, processing, and responding in conversation  To address problems with fluctuating attention and executive dysfunction, she may wish to consider:   -Avoiding external distractions when needing to concentrate   -Limiting exposure to fast paced environments with multiple sensory demands   -Writing down complicated information and using checklists   -Attempting and completing one task at a time (i.e., no multi-tasking)   -Verbalizing aloud each  step of a task to maintain focus   -Reducing the amount of information considered at one time  Review of Records:   Ms. Fagerstrom was seen by [facility] Arlice Colt, M.D.) on 06/19/2020 for follow-up of relapsing remitting multiple sclerosis (MS). Briefly, in 1989, she experienced severe numbness in one side  of her body. She had clumsiness and poor gait. MRI was reportedly normal. She saw Dr. Erling Cruz who did an LP that showed oligoclonal bands. She received IV steroids and was told she had MS. In 1993, she woke up numb from the waist down in both legs. This numbness was more intense than the prior episodes and her gait was very poor. She was admitted x 28 days, receiving many days of IV steroids followed by Rehab. A brain MRI was normal but a thoracic spine MRI showed a plaque at T4. She was started on Betaseron and did well in general. She had some fluctuating symptoms but nothing resembling the previous episode. In November 2014 she had additional imaging studies showing a normal brain MRI, an abnormal MRI of the thoracic spine with a focus at T4, and a normal cervical spinal cord. She did have evidence of prior fusion surgery in the neck. Cervical spine MRI in September 2016 showed stable postoperative ACDF at C4-C5. The spinal cord appeared normal and there was no myelopathy in the cervical spine. She also underwent a lumbar puncture in November 2014 which was normal and did not show oligoclonal bands or increased IgG index. She has been treated with Betaseron, Avonex, Tysabri ,and Gilenya for presumptive MS in the past. She stopped Tysabri as she was JCV Ab positive and stopped Gilenya as she had macula edema. She tried [place] but stopped due to insurance. No DMT since 2014.  During her June 2021 appointment with Dr. Felecia Shelling, Ms. Randhawa reported continued concerns surrounding her cognition. She has been prescribed Adderall and this assists with attention/concentration difficulties  somewhat. She reported not recalling what she has previously read, as well as word finding difficulties. She acknowledged some trouble remembering to take her medications. Trouble with driving was denied. She feels her gait is the same. She acknowledged weakness in her legs but noted that these symptoms appeared stable relative to the previous year. Performance on a brief cognitive screening instrument (MOCA) on 09/17/2019 was 16/30. Ultimately, Ms. Figge was referred for a comprehensive neuropsychological evaluation to characterize her cognitive abilities and to assist with diagnostic clarity and treatment planning.   Brain MRI on 09/15/2015 was said to be stable and unremarkable. Head CT on 09/25/2019 was normal.   Past Medical History:  Diagnosis Date  . 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 Arthritis    back  . Asthma    20 yrs., ago one episode  . Chronic migraine headaches 03/13/2008  . Chronic rhinitis 03/13/2008  . Deviated septum 01/23/2020  . Dysesthesia    bilateral feet  . Dysphagia, oropharyngeal phase 10/21/2009  . Dyspnea   . Gastroesophageal reflux disease 09/10/2008  . Heart murmur    first noticed 41 yrs. when pregnant, can't hear it now  . History of colon polyps    2013;  2015  . History of hiatal hernia   . 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 HNP (herniated nucleus pulposus), cervical 05/17/2019  . Hypokalemia 12/04/2019  . Mitral valve prolapse    states no problem  . Multiple sclerosis 1989  . Myocarditis 04/25/2019  . Neurogenic bladder   . Obstructive chronic bronchitis with exacerbation 09/10/2008   Qualifier: Diagnosis of  By: Joya Gaskins MD, Burnett Harry   . Occipital neuralgia 05/06/2015  . Osteoporosis   . Pneumonia   . PONV (postoperative nausea and vomiting)   .  Pulmonary nodule, right upper lobe 01/22/2009  . Short of breath on exertion   . Sjogren's disease    dryness of  eyes, mouth  . Spastic gait   . Transverse myelitis    T4  . Urinary frequency 02/05/2015  . Vitamin D deficiency 12/09/2016  . Vocal cord disorder 10/21/2009  . Wears bilateral ankle braces    AFO braces for stability    Past Surgical History:  Procedure Laterality Date  . ANAL RECTAL MANOMETRY N/A 08/18/2016   Procedure: ANO RECTAL MANOMETRY;  Surgeon: Leighton Ruff, MD;  Location: [facility];  Service: Endoscopy;  Laterality: N/A;  . ANTERIOR CERVICAL DECOMP/DISCECTOMY FUSION  2004;   2003   C4 -- C5 (2004)/   C3 -- C4 (2003)  . ANTERIOR CERVICAL DECOMP/DISCECTOMY FUSION N/A 05/17/2019   Procedure: EVACUATION HEMATOMA OF POST OP ANTERIOR CERVICAL DECOMPRESSION FUSION;  Surgeon: Jovita Gamma, MD;  Location: [place];  Service: Neurosurgery;  Laterality: N/A;  . ANTERIOR CERVICAL DECOMP/DISCECTOMY FUSION N/A 05/17/2019   Procedure: REMOVAL OF TETHER CERVICAL PLATE, ANTERIOR CERVICAL DECOMPRESSION/DISCECTOMY FUSION CERVICAL FIVE- CERVICAL SIX, CERVICAL SIX- CERVICAL SEVEN;  Surgeon: Jovita Gamma, MD;  Location: [place];  Service: Neurosurgery;  Laterality: N/A;  . APPENDECTOMY  06/22/2011   laparoscopic  . BILATERAL SALPINGOOPHORECTOMY  1982  . BLADDER SUSPENSION  1994   sling  . BREAST ENHANCEMENT SURGERY Bilateral   . BREAST IMPLANT REMOVAL Bilateral 12/03/2008  . BUNIONECTOMY    . CATARACT EXTRACTION W/ INTRAOCULAR LENS  IMPLANT, BILATERAL  2015  . ELBOW SURGERY Right   . HEMORROIDECTOMY  08/05/2010;  2013  . INSERTION SACRAL NERVE STIMULATOR TEST WIRE  09-28-2016   dr Marcello Moores  . INTERCOSTAL NERVE BLOCK  2015   occipital  . MENISCUS REPAIR Left 09/26/2018  . RECTAL ULTRASOUND N/A 08/18/2016   Procedure: RECTAL ULTRASOUND;  Surgeon: Leighton Ruff, MD;  Location: [facility];  Service: Endoscopy;  Laterality: N/A;  . SHOULDER ARTHROSCOPY Left   . SHOULDER ARTHROSCOPY WITH DISTAL CLAVICLE RESECTION Right 09/21/2007   w/  Acrominoplasty,  Debridement Rotator Cuff,  CA ligament  release  . TONSILLECTOMY  age 46  . TRIGGER FINGER RELEASE Left 01/16/2015   Procedure: LEFT THUMB TRIGGER RELEASE ;  Surgeon: Leanora Cover, MD;  Location: [place];  Service: Orthopedics;  Laterality: Left;  Marland Kitchen VAGINAL HYSTERECTOMY  1981    Current Outpatient Medications:  .  Amantadine HCl 100 MG tablet, TAKE 1 TABLET BY MOUTH THREE TIMES A DAY, Disp: 270 tablet, Rfl: 1 .  amphetamine-dextroamphetamine (ADDERALL) 15 MG tablet, Take one po qAm and one po 4 hours later, Disp: 60 tablet, Rfl: 0 .  Biotin 10000 MCG TABS, Take 1 tablet by mouth daily., Disp: , Rfl:  .  calcium carbonate (OS-CAL) 600 MG TABS tablet, Take 1,200 mg by mouth. , Disp: , Rfl:  .  Dalfampridine (4-AMINOPYRIDINE) POWD, Take 5 mg by mouth 3 (three) times daily. Pharmacy dispense 270 capsules with 3 refills. Thank you, Disp: 270 g, Rfl: 3 .  denosumab (PROLIA) 60 MG/ML SOLN injection, Inject 60 mg into the skin every 6 (six) months. Administer in upper arm, thigh, or abdomen, Disp: , Rfl:  .  DEXILANT 60 MG capsule, Take 1 capsule by mouth 2 (two) times daily. , Disp: , Rfl:  .  donepezil (ARICEPT) 10 MG tablet, Take 1 tablet (10 mg total) by mouth at bedtime., Disp: 90 tablet, Rfl: 4 .  lamoTRIgine (LAMICTAL) 200 MG tablet, Take 1 tablet (200  mg total) by mouth 2 (two) times daily., Disp: 180 tablet, Rfl: 3 .  loratadine (CLARITIN) 10 MG tablet, Take 10 mg by mouth daily., Disp: , Rfl:  .  Meclizine HCl 25 MG CHEW, Chew 2 tablets by mouth as needed (dizziness). , Disp: , Rfl:  .  Multiple Vitamins-Minerals (MULTIVITAMIN PO), Take 1 tablet by mouth daily. Brand name - Mature Multivitamin from [employer], Disp: , Rfl:  .  ondansetron (ZOFRAN-ODT) 4 MG disintegrating tablet, Take 4 mg by mouth every 6 (six) hours as needed., Disp: , Rfl:  .  Probiotic Product (PHILLIPS COLON HEALTH PO), Take 1 tablet by mouth every morning., Disp: , Rfl:  .  rOPINIRole (REQUIP) 0.25 MG tablet, TAKE 1 TO 2 TABLETS BY MOUTH AT  NIGHT (Patient taking differently: Take 0.5 mg by mouth at bedtime. ), Disp: 180 tablet, Rfl: 3 .  Vitamin D, Ergocalciferol, (DRISDOL) 50000 UNITS CAPS capsule, Take 1 capsule (50,000 Units total) by mouth every 7 (seven) days. When finished, take an otc daily vitamin d supplement of 5,000iu. (Patient taking differently: Take 50,000 Units by mouth every Sunday. ), Disp: 12 capsule, Rfl: 0  Clinical Interview:   Cognitive Symptoms: Decreased short-term memory: Endorsed. She reported trouble recalling the details of prior conversations, misplacing objects around her home, losing her train of thought, and difficulties remembering the names of individuals. The latter was said to be longstanding in nature. Other deficits were said to have become observable during the past 1.5 to 2 years and appear to have gradually worsened over time.  Decreased long-term memory: Denied. Decreased attention/concentration: Endorsed. She described trouble with sustained attention and increased ease of distractibility. Neurology records suggest a history of ADHD and her being prescribed Adderall. She acknowledged taking Adderall to help with attention/concentration, but denied ever being diagnosed with ADHD as a child or adult.  Reduced processing speed: Endorsed. However, symptoms were said to variably occur.  Difficulties with executive functions: Endorsed. Specifically, she noted trouble with disorganization. She denied trouble with indecisiveness or impulsivity. Personality changes were likewise denied.  Difficulties with emotion regulation: Denied. Difficulties with receptive language: Endorsed. She reported her belief that these represented comprehension deficits rather than secondary to deficits in processing speed and attention.  Difficulties with word finding: Endorsed. She reported frequently experiencing a tip-of-the-tongue phenomenon.  Decreased visuoperceptual ability: Endorsed. Trouble with depth perception was  said to be longstanding and stable in nature.  Difficulties completing ADLs: Somewhat. She generally denied trouble with medication management, but did describe a somewhat over-complicated system for helping her remember if she has taken her medications or not. She did acknowledge instances of being unsure if she has taken her medications earlier that day. In these instances, she does not take subsequent dosages to ensure that she does not take too much. Her husband manages their finances which is longstanding in nature. She continues to drive without issue.   Additional Medical History: History of traumatic brain injury/concussion: Denied. History of stroke: Denied. History of seizure activity: Denied. History of known exposure to toxins: Denied. Symptoms of chronic pain: Endorsed. She reported ongoing chronic neck pain and a history of several spinal fusion procedures.  Experience of frequent headaches/migraines: Endorsed. She reported a remote history of migraine headaches. Currently, she reported non-migraine headaches occurring several times per week. These generally occur around her forehead, as well as the back of her head. OTC medications are not always helpful in alleviating these symptoms.  Frequent instances of dizziness/vertigo: Denied. They were said  to occur occasionally and more often while in the dark.   Sensory changes: She reported a history of cataract surgery. She also reported continued floaters in her visual field and requires readers to improve visual acuity. Hearing was described to be in the lower limits of the normal range approximately 5 years prior. Other sensory changes/deficits (i.e., taste or smell) were denied.  Balance/coordination difficulties: Endorsed. She described her balance as "not great" due to her history of transverse myelitis. She denied a history of recent falls which led to significant injury.  Other motor difficulties: Endorsed. She reported subtle to  mild tremulous behaviors in her right hand. These have been present over the past 1.5 to 2 years and have seemed to worsen.   Sleep History: Estimated hours obtained each night: 6 hours. Difficulties falling asleep: Denied. Difficulties staying asleep: Endorsed. She reported that ongoing neck pain makes it very difficult to get comfortable and remain asleep throughout the night. She also reported instances of waking to use the restroom.  Feels rested and refreshed upon awakening: Denied.  History of snoring: Endorsed very infrequently. History of waking up gasping for air: Denied. Witnessed breath cessation while asleep: Denied.  History of vivid dreaming: Denied. Excessive movement while asleep: Denied. Instances of acting out her dreams: Denied.  Psychiatric/Behavioral Health History: Depression: Denied. She described her current mood as "good" and denied any prior mental health concerns or diagnoses. She denied previously working with a English as a second language teacher. Likewise, she denied current or remote suicidal ideation, intent, or plan.  Anxiety: Denied. Mania: Denied. Trauma History: Denied. Visual/auditory hallucinations: Denied. Delusional thoughts: Denied.  Tobacco: Denied. Alcohol: She denied current alcohol consumption as well as a history of problematic alcohol abuse or dependence.  Recreational drugs: Denied. Caffeine: Denied.  Family History: Problem Relation Age of Onset  . Liver disease Father   . Cirrhosis Father   . Stroke Mother   . Cancer Mother        oropharyngeal  . Heart attack Mother   . Anesthesia problems Mother        post-op nausea  . Cancer Brother        twin; tonsillar?  . Cancer Sister 52       breast ca, lung ca  . Cancer Other        Nephew; appendiceal carcinoid, melanoma  . Cancer Paternal Grandmother        cervical   This information was confirmed by Ms. Ceesay.  Academic/Vocational History: Highest level of educational  attainment: 13 years. She graduated from high school and completed one additional year of college. 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 Math was noted as a relative weakness.  History of developmental delay: Denied. History of grade repetition: Denied. Enrollment in special education courses: Denied. History of LD/ADHD: Denied.  Employment: She has been on disability since 1993 due to physical symptoms of transverse myelitis and/or multiple sclerosis. 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   Evaluation Results:   Behavioral Observations: Ms. Orban was unaccompanied, arrived to her appointment on time, and was appropriately dressed and groomed. She appeared alert and oriented. She ambulated with the assistance of a cane but did not demonstrate significant balance instability or require other external sources of support. Gross motor functioning appeared intact upon informal observation and no abnormal movements (e.g., tremors) were noted during interview. A very slight tremor was observed when performing motor tasks during testing. Her affect was generally relaxed and positive, but  did range appropriately given the subject being discussed during the clinical interview or the task at hand during testing procedures. Spontaneous speech was fluent and word finding difficulties were not observed during the clinical interview. Thought processes were coherent, organized, and normal in content. Insight into her cognitive difficulties appeared adequate. During testing, sustained attention was appropriate. Task engagement was adequate and she persisted when challenged. Processing speed was noted to be quite slow at times with Ms. Thiemann exhibiting a response latency across several tasks. She also had trouble comprehending more complex instructions (e.g., TMT B). Overall, Ms. Wettstein was cooperative with the clinical interview and subsequent testing procedures.   Adequacy  of Effort: The validity of neuropsychological testing is limited by the extent to which the individual being tested may be assumed to have exerted adequate effort during testing. Ms. Simmerman expressed her intention to perform to the best of her abilities and exhibited adequate task engagement and persistence. Scores across stand-alone and embedded performance validity measures were variable. As such, the results of the current evaluation should be interpreted with a mild degree of caution.  Test Results: Ms. Tewksbury was generally oriented at the time of the current evaluation. Points were lost for her stating the incorrect day of the week and being unable to provide the specific name of the clinic.  Intellectual abilities based upon educational and vocational attainment were estimated to be in the average range. Premorbid abilities were estimated to be within the above average range based upon a single-word reading test.   Processing speed was exceptionally low to below average. Basic attention was well below average. More complex attention (e.g., working memory) was exceptionally low. Executive functioning was generally well below average, representing a noted weakness across the current evaluation. Response inhibition exhibited a relative strength.  Assessed receptive language abilities were average. Sentence repetition was exceptionally low, while performance on a semantic knowledge screening test was within expectation. Assessed expressive language was variable. Phonemic fluency was below average, semantic fluency was exceptionally low, and confrontation naming was above average.     Assessed visuospatial/visuoconstructional abilities were largely average. Points were lost on her drawing of a clock due to incorrect hand placement (including both hands originating from the number 12).    Learning (i.e., encoding) of novel verbal and visual information was well below average to below average.  Spontaneous delayed recall (i.e., retrieval) of previously learned information was variable, ranging from the exceptionally low to average normative ranges. Retention rates were 73% across a story learning task, 0% across a list learning task, and 100% across a shape learning task. Performance across recognition tasks was largely average, suggesting evidence for information consolidation.   Results of emotional screening instruments suggested that recent symptoms of generalized anxiety were in the minimal range, while symptoms of depression were within normal limits. A screening instrument assessing recent sleep quality suggested the presence of moderate sleep dysfunction.  Tables of Scores:   Note: This summary of test scores accompanies the interpretive report and should not be considered in isolation without reference to the appropriate sections in the text. Descriptors are based on appropriate normative data and may be adjusted based on clinical judgment. The terms "impaired" and "within normal limits (WNL)" are used when a more specific level of functioning cannot be determined.       Effort Testing:   DESCRIPTOR       ACS Word Choice: --- --- Below Expectation  Dot Counting Test: --- --- Below Expectation  NAB EVI: --- ---  Within Expectation  D-KEFS Color Word Effort Index: --- --- Within Expectation       Orientation:      Raw Score Percentile   NAB Orientation, Form 1 27/29 --- ---       Intellectual Functioning:           Standard Score Percentile   Test of Premorbid Functioning: 696 78 Above Average       Memory:          NAB Memory Module, Form 2: Standard Score/ T Score Percentile   Total Memory Index 75 5 Well Below Average  List Learning       Total Trials 1-3 18/36 (39) 14 Below Average    List B 3/12 (41) 18 Below Average    Short Delay Free Recall 6/12 (42) 21 Below Average    Long Delay Free Recall 0/12 (26) 1 Exceptionally Low    Retention Percentage 0 (19) <1  Exceptionally Low    Recognition Discriminability 7 (45) 31 Average  Shape Learning       Total Trials 1-3 12/27 (40) 16 Below Average    Delayed Recall 6/9 (54) 66 Average    Retention Percentage 100 (50) 50 Average    Recognition Discriminability 7 (52) 58 Average  Story Learning       Immediate Recall 30/80 (33) 5 Well Below Average    Delayed Recall 11/40 (31) 3 Well Below Average    Retention Percentage 73 (43) 25 Average  Daily Living Memory       Immediate Recall 35/51 (36) 8 Well Below Average    Delayed Recall 10/17 (36) 8 Well Below Average    Retention Percentage 67 (34) 5 Well Below Average    Recognition Hits 6/10 (29) 2 Exceptionally Low       Attention/Executive Function:          Trail Making Test (TMT): Raw Score (T Score) Percentile     Part A 68 secs.,  0 errors (29) 2 Exceptionally Low    Part B 169 secs.,  1 error (31) 3 Well Below Average        Symbol Digit Modalities Test (SDMT): Raw Score (Z-Score) Percentile     Written 29 (-1.29) 10 Below Average    Oral 36 (-1.24) 11 Below Average       NAB Attention Module, Form 1: T Score Percentile     Digits Forward 35 7 Well Below Average    Digits Backwards 29 2 Exceptionally Low       D-KEFS Color-Word Interference Test: Raw Score (Scaled Score) Percentile     Color Naming 41 secs. (6) 9 Below Average    Word Reading 30 secs. (7) 16 Below Average    Inhibition 90 secs. (6) 9 Below Average      Total Errors 0 errors (12) 75 Above Average    Inhibition/Switching 108 secs. (6) 9 Below Average      Total Errors 2 errors (11) 63 Average       [place] Card Sorting Test: Raw Score Percentile     Categories (trials) 1 (64) 6-10 Well Below Average    Total Errors 34 3 Well Below Average    Perseverative Errors 19 8 Well Below Average    Non-Perseverative Errors 15 5 Well Below Average    Failure to Maintain Set 0 --- ---       Language:           Raw Score Percentile  PPVT Screening Instrument: 16/16  --- Within Expectation  Sentence Repetition: 11/22 1 Exceptionally Low       Verbal Fluency Test: Raw Score (T Score) Percentile     Phonemic Fluency (FAS) 33 (41) 18 Below Average    Animal Fluency 7 (16) <1 Exceptionally Low        NAB Language Module, Form 1: T Score Percentile     Auditory Comprehension 43 25 Average    Naming 31/31 (57) 75 Above Average       Visuospatial/Visuoconstruction:      Raw Score Percentile   Clock Drawing: 8/10 --- Within Normal Limits       NAB Spatial Module, Form 1: T Score Percentile     Figure Drawing Copy 55 69 Average        Scaled Score Percentile   WAIS-IV Block Design: 8 25 Average       Mood and Personality:      Raw Score Percentile   Geriatric Depression Scale: 3 --- Within Normal Limits  Geriatric Anxiety Scale: 6 --- Minimal    Somatic 4 --- Minimal    Cognitive 2 --- Minimal    Affective 0 --- Minimal       Additional Questionnaires:      Raw Score Percentile   PROMIS Sleep Disturbance Questionnaire: 30 --- Moderate   Informed Consent and Coding/Compliance:   Ms. Barrie was provided with a verbal description of the nature and purpose of the present neuropsychological evaluation. Also reviewed were the foreseeable risks and/or discomforts and benefits of the procedure, limits of confidentiality, and mandatory reporting requirements of this provider. The patient was given the opportunity to ask questions and receive answers about the evaluation. Oral consent to participate was provided by the patient.   This evaluation was conducted by Christia Reading, Ph.D., licensed clinical neuropsychologist. Ms. Gervasi completed a comprehensive clinical interview with Dr. Melvyn Novas, billed as one unit [phone], and 150 minutes of cognitive testing and scoring, billed as one unit [phone] and four additional units [id number]. Psychometrist Milana Kidney, B.S., assisted Dr. Melvyn Novas with test administration and scoring procedures. As a separate and discrete  service, Dr. Melvyn Novas spent a total of 160 minutes in interpretation and report writing billed as one unit [phone] and two units 96133.

## 2020-07-04 ENCOUNTER — Other Ambulatory Visit: Payer: Self-pay

## 2020-07-04 ENCOUNTER — Ambulatory Visit (INDEPENDENT_AMBULATORY_CARE_PROVIDER_SITE_OTHER): Payer: PPO | Admitting: Psychology

## 2020-07-04 DIAGNOSIS — G35 Multiple sclerosis: Secondary | ICD-10-CM

## 2020-07-04 DIAGNOSIS — G373 Acute transverse myelitis in demyelinating disease of central nervous system: Secondary | ICD-10-CM | POA: Diagnosis not present

## 2020-07-04 NOTE — Progress Notes (Signed)
   Neuropsychology Feedback Session [name]. [facility]  Reason for Referral:   [name] a 69 y.o. right-handed Caucasian female referred by Arlice Colt, M.D.,to characterize hercurrent cognitive functioning and assist with diagnostic clarity and treatment planning in the context of subjective cognitive decline and concerns surrounding transverse myelitis and/or multiple sclerosis.   Feedback:   [name] completed a comprehensive neuropsychological evaluation on 06/27/2020. Please refer to that encounter for the full report and recommendations. Briefly, results suggested prominent deficits surrounding executive functioning and complex attention, with additional weaknesses across processing speed, semantic fluency, and encoding (i.e., learning) and retrieval aspects of memory. The etiology of her weaknesses is somewhat unclear. Her patterns of performance are consistent with what would be expected in an individual with multiple sclerosis (MS). Traditional weaknesses in this condition focus on processing speed, attention/concentration, executive functioning, and encoding/retrieval aspects of memory. Despite this consistency, it remains very odd that previous neuroimaging has not suggested any brain lesions (brain MRI and head CTs in 2016 and 2020 respectively came back largely unremarkable) and the presence of oligoclonal bands seemingly resolved during a follow-up 2014 lumbar puncture. It is important to note that transverse myelitis is also an inflammatory process which can be caused by MS. However, as the inflammatory process in transverse myelitis is specific to the spinal cord and not the brain, neuropsychological dysfunction is not associated with this condition, meaning that this cannot explain the presence of cognitive deficits alone. Medical records do suggest a history of ADHD; however, [name] denied ever being diagnosed with  this condition. If present, it is possible that symptoms of MS are exacerbating underlying attentional dysregulation, thus worsening her clinical presentation.   [name] was unaccompanied during the current telephone call. She was within her residence while I was within my office. Content of the current session focused on the results of her neuropsychological evaluation. [name] was given the opportunity to ask questions and her questions were answered. She was encouraged to reach out should additional questions arise. A copy of her report was mailed at the conclusion of the visit.      16 minutes were spent conducting the current feedback session with [name], billed as one unit [phone].

## 2020-07-04 NOTE — Patient Instructions (Signed)
Recommendations: A repeat neuropsychological evaluation in 24 months (or sooner if functional decline is noted) is recommended to assess the trajectory of future cognitive decline should it occur. This will also aid in future efforts towards improved diagnostic clarity.  Unless there is a medical or other reason which would prevent this, updated neuroimaging in the form of a brain MRI would be warranted. Per records available for me to review, her previous scan was completed in 2016. An updated study would allow for a better understanding of emerging brain lesions which are often an accompanying symptom of MS and potential correlates for cognitive dysfunction.   [name] described a medication management strategy involving flipping her pill bottles over or on their sides to represent when she has taken a medication. This seems unnecessarily complicated and prone to errors (e.g., if someone accidentally turns the bottle right side up in an effort to clean a space or a bottle on it side rolls off the counter). I would encourage to her purchase a pill box so that medication management efforts can be more structured and less prone to confusion.   Should there be a progression of her current deficits over time, [name] is unlikely to regain any independent living skills lost. Therefore, it is recommended that she remain as involved as possible in all aspects of household chores, finances, and medication management, with supervision to ensure adequate performance. She will likely benefit from the establishment and maintenance of a routine in order to maximize her functional abilities over time.  [name] is encouraged to attend to lifestyle factors for brain health (e.g., regular physical exercise, good nutrition habits, regular participation in cognitively-stimulating activities, and general stress management techniques), which are likely to have benefits for both emotional adjustment and  cognition. In fact, in addition to promoting good general health, regular exercise incorporating aerobic activities (e.g., brisk walking, jogging, cycling, etc.) has been demonstrated to be a very effective treatment for depression and stress, with similar efficacy rates to both antidepressant medication and psychotherapy. Optimal control of vascular risk factors (including safe cardiovascular exercise and adherence to dietary recommendations) is encouraged.  If interested, there are some activities which have therapeutic value and can be useful in keeping her cognitively stimulated. For suggestions, [name] is encouraged to go to the following website: https://www.[facility].org/get-to-know-barrow/centers-programs/neurorehabilitation-center/neuro-rehab-apps-and-games/ which has options, categorized by level of difficulty. It should be noted that these activities should not be viewed as a substitute for therapy.  When learning new information, she would benefit from information being broken up into small, manageable pieces. She may also find it helpful to articulate the material in her own words and in a context to promote encoding at the onset of a new task. This material may need to be repeated multiple times to promote encoding.  Memory can be improved using internal strategies such as rehearsal, repetition, chunking, mnemonics, association, and imagery. External strategies such as written notes in a consistently used memory journal, visual and nonverbal auditory cues such as a calendar on the refrigerator or appointments with alarm, such as on a cell phone, can also help maximize recall.    To address problems with processing speed, she may wish to consider:   -Ensuring that she is alerted when essential material or instructions are being presented   -Adjusting the speed at which new information is presented   -Allowing for more time in comprehending, processing, and responding in  conversation  To address problems with fluctuating attention and executive dysfunction, she  may wish to consider:   -Avoiding external distractions when needing to concentrate   -Limiting exposure to fast paced environments with multiple sensory demands   -Writing down complicated information and using checklists   -Attempting and completing one task at a time (i.e., no multi-tasking)   -Verbalizing aloud each step of a task to maintain focus   -Reducing the amount of information considered at one time

## 2020-07-10 IMAGING — XA DG MYELOGRAPHY LUMBAR INJ CERVICAL
12 series · 12 of 12 positions shown · non-contrast
Comparison: Cervical spine MRI 03/15/2016

CLINICAL DATA: Cervical radiculopathy. Neck pain, left periscapular
pain, and left arm pain with symptoms sometimes involving the left
thumb and index finger. Prior cervical fusion.
TECHNIQUE: Contiguous axial images were obtained through the Cervical spine
after the intrathecal infusion of infusion. Coronal and sagittal
reconstructions were obtained of the axial image sets.

[Series 1: ortho standard · 1 of 1 slices shown (1 of 9)]
[im 1/1]
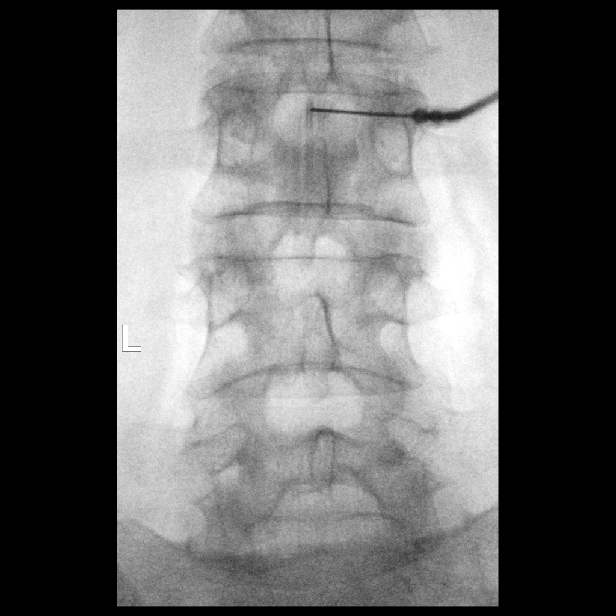

[Series 1: w cervical spine lat · 0.15mm/px · 1 of 1 slices shown]
[im 1/1]
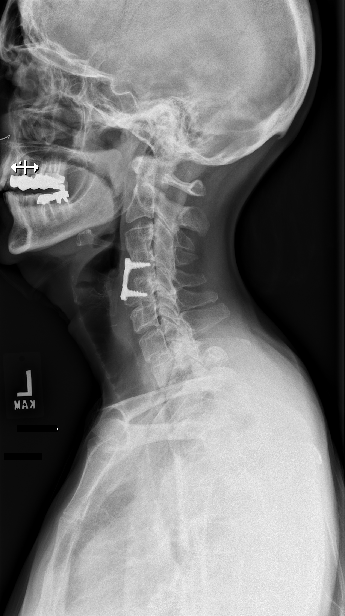

[Series 2: ortho standard · 1 of 1 slices shown (2 of 9)]
[im 1/1]
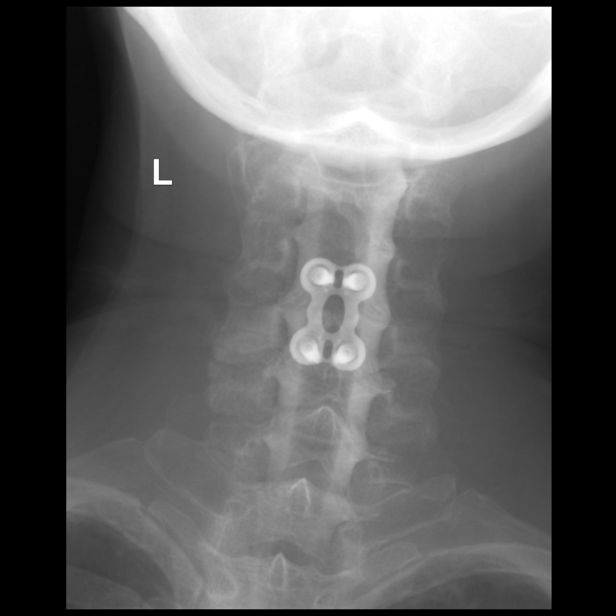

[Series 2: w cervical spine flexion · 0.15mm/px · 1 of 1 slices shown]
[im 1/1]
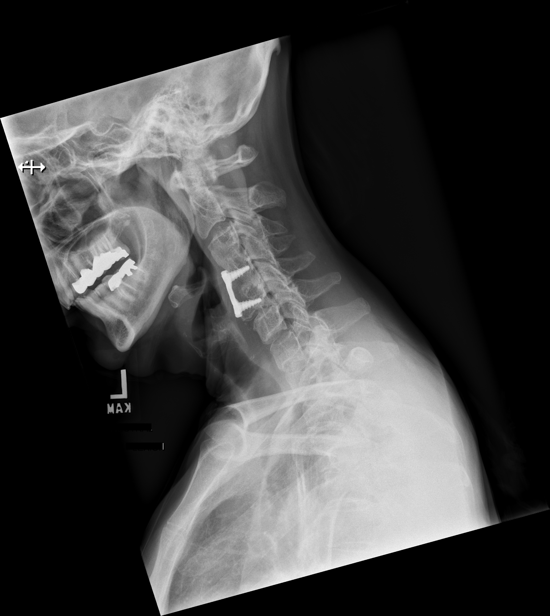

[Series 3: w cervical spine extension · 0.15mm/px · 1 of 1 slices shown]
[im 1/1]
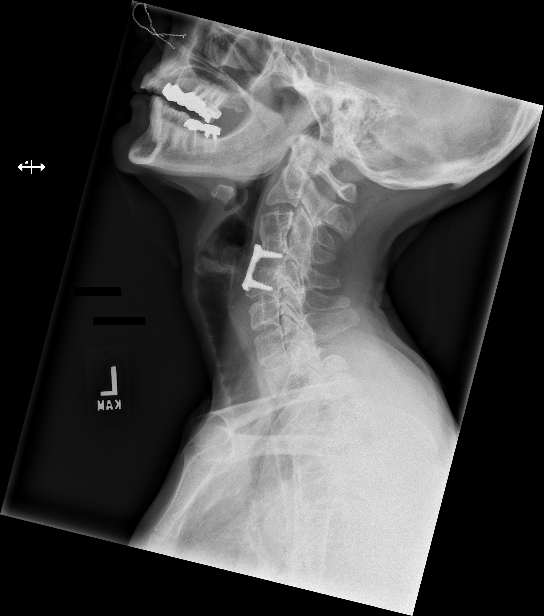

[Series 3: ortho standard · 1 of 1 slices shown (3 of 9)]
[im 1/1]
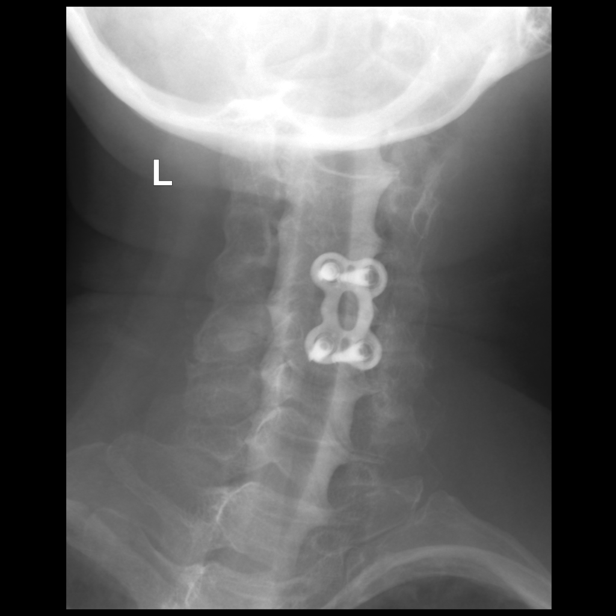

[Series 4: ortho standard · 1 of 1 slices shown (4 of 9)]
[im 1/1]
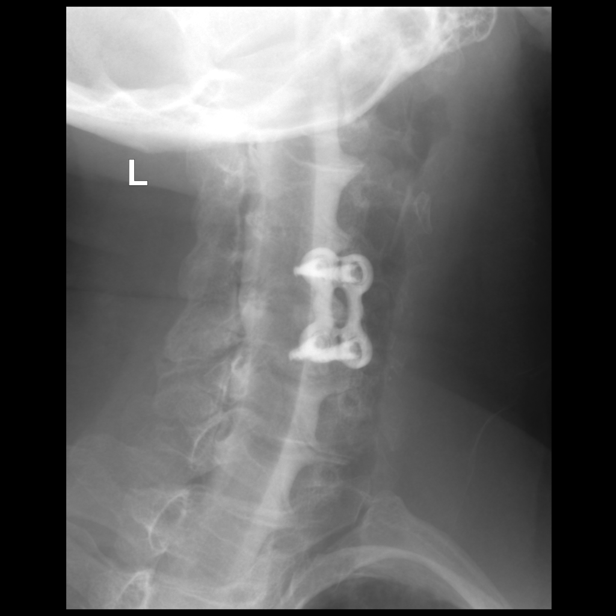

[Series 5: ortho standard · 1 of 1 slices shown (5 of 9)]
[im 1/1]
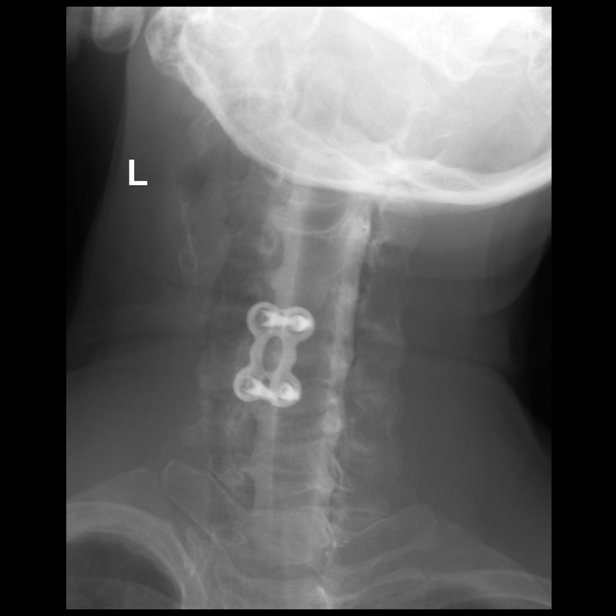

[Series 6: ortho standard · 1 of 1 slices shown (6 of 9)]
[im 1/1]
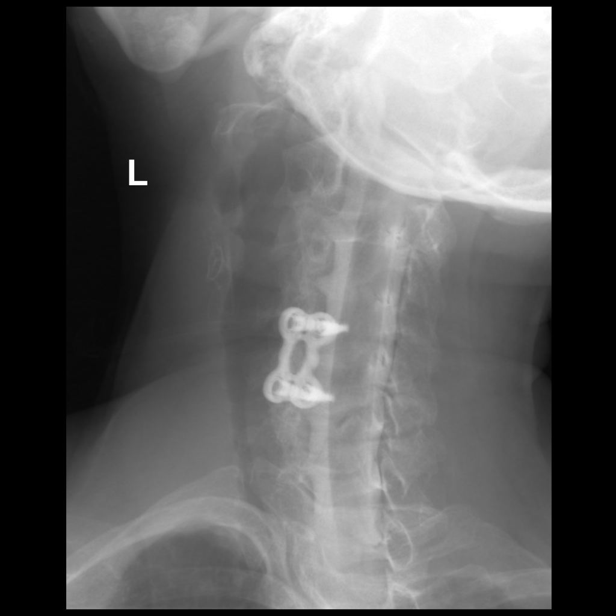

[Series 7: ortho standard · 1 of 1 slices shown (7 of 9)]
[im 1/1]
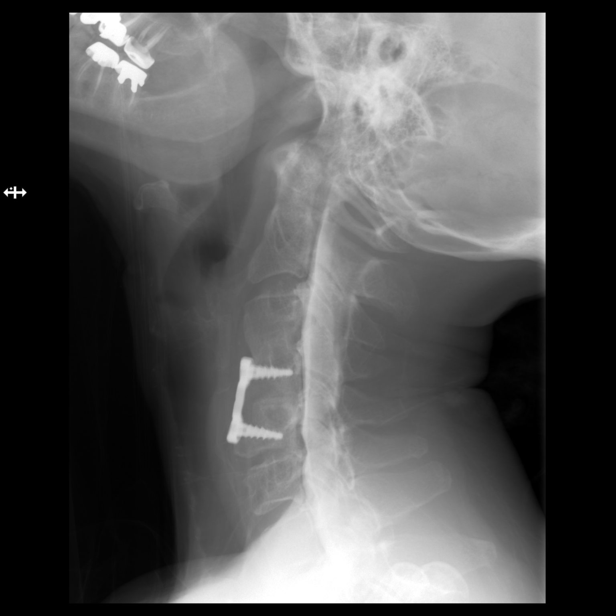

[Series 8: ortho standard · 1 of 1 slices shown (8 of 9)]
[im 1/1]
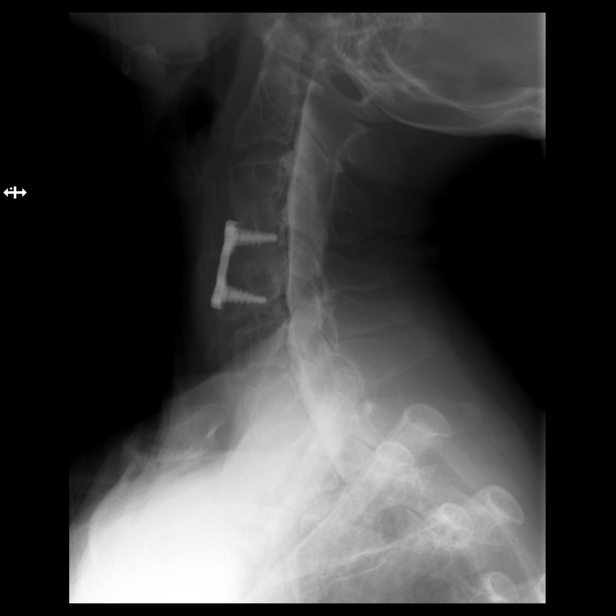

[Series 9: ortho standard · 1 of 1 slices shown (9 of 9)]
[im 1/1]
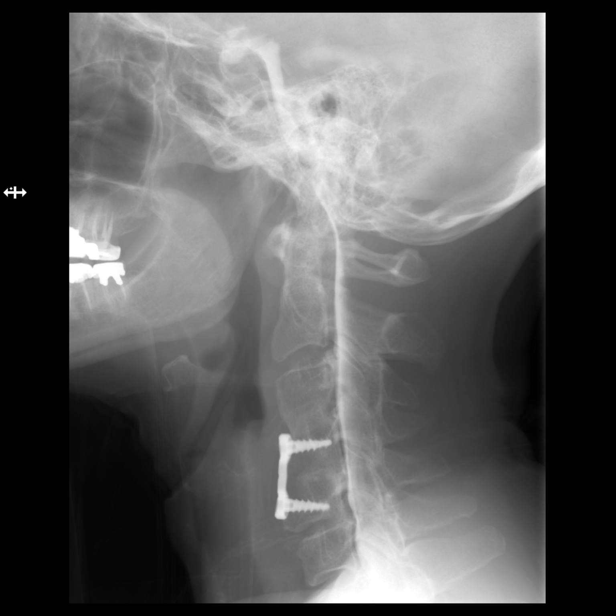

[12 of 12 positions shown; findings below may reference images not displayed]

FLUOROSCOPY TIME:  Fluoroscopy Time: 19 seconds

Radiation Exposure Index: 93.73 microGray*m^2

PROCEDURE:
LUMBAR PUNCTURE FOR CERVICAL MYELOGRAM

After thorough discussion of risks and benefits of the procedure
including bleeding, infection, injury to nerves, blood vessels,
adjacent structures as well as headache and CSF leak, written and
oral informed consent was obtained. Consent was obtained by Dr.
Juliane Lov. We discussed the high likelihood of obtaining a
diagnostic study.

Patient was positioned prone on the fluoroscopy table. Local
anesthesia was provided with 1% lidocaine without epinephrine after
prepped and draped in the usual sterile fashion. Puncture was
performed at L2-3 using a 3 1/2 inch 22-gauge spinal needle via a
right interlaminar approach. Using a single pass through the dura,
the needle was placed within the thecal sac, with return of clear
CSF. 10 mL of Isovue 7-YNN was injected into the thecal sac, with
normal opacification of the nerve roots and cauda equina consistent
with free flow within the subarachnoid space. The patient was then
moved to the trendelenburg position and contrast flowed into the
Cervical spine region.

I personally performed the lumbar puncture and administered the
intrathecal contrast. I also personally supervised acquisition of
the myelogram images.
FINDINGS: CERVICAL MYELOGRAM FINDINGS:

There is straightening of the cervical lordosis with solid interbody
osseous fusion at C3-4 and C4-5. Minimal anterolisthesis of C5 on C6
and C6 on C7 develops with flexion. Ventral extradural defects are
larger at C6-7 and C5-6 although there is no evidence of high-grade
spinal stenosis. There is slightly asymmetric C6 nerve root sleeve
filling on the left.

CT CERVICAL MYELOGRAM FINDINGS:

There is straightening of the normal cervical lordosis without
significant listhesis. Solid interbody osseous fusion is present at
C3-4 and C4-5 with interbody implants at both levels. Anterior plate
and screws remain in place at C4-5. No acute fracture or suspicious
osseous lesion is identified. The cervical spinal cord is normal in
size. Mild scarring is noted in the lung apices.

C2-3: Mild left facet arthrosis without disc herniation or stenosis,
unchanged.

C3-4: ACDF.  Right facet ankylosis.  No stenosis.

C4-5: ACDF.  No stenosis.

C5-6: Moderate disc space narrowing. Disc bulging, uncovertebral
spurring, and mild right and moderate left facet arthrosis result in
mild-to-moderate bilateral neural foraminal stenosis, similar to the
prior MRI. Posterior disc bulging is slightly less prominent than on
the prior MRI, and there is no significant spinal stenosis.

C6-7: A broad left central disc protrusion is larger than on the
prior MRI and partially effaces the ventral CSF without significant
spinal stenosis or spinal cord mass effect. The neural foramina are
patent.

C7-T1: Negative.
IMPRESSION: 1. Increased size of left central disc protrusion at C6-7 without
significant stenosis.
2. Unchanged mild-to-moderate bilateral neural foraminal stenosis at
C5-6.
3. Solid C3-C5 ACDF without stenosis.

## 2020-07-15 DIAGNOSIS — H04552 Acquired stenosis of left nasolacrimal duct: Secondary | ICD-10-CM | POA: Diagnosis not present

## 2020-07-15 DIAGNOSIS — H02139 Senile ectropion of unspecified eye, unspecified eyelid: Secondary | ICD-10-CM | POA: Diagnosis not present

## 2020-07-15 DIAGNOSIS — J310 Chronic rhinitis: Secondary | ICD-10-CM | POA: Diagnosis not present

## 2020-07-15 DIAGNOSIS — H04129 Dry eye syndrome of unspecified lacrimal gland: Secondary | ICD-10-CM | POA: Diagnosis not present

## 2020-07-15 DIAGNOSIS — H04223 Epiphora due to insufficient drainage, bilateral lacrimal glands: Secondary | ICD-10-CM | POA: Diagnosis not present

## 2020-07-15 DIAGNOSIS — L720 Epidermal cyst: Secondary | ICD-10-CM | POA: Diagnosis not present

## 2020-08-11 DIAGNOSIS — I10 Essential (primary) hypertension: Secondary | ICD-10-CM | POA: Diagnosis not present

## 2020-08-11 DIAGNOSIS — R600 Localized edema: Secondary | ICD-10-CM | POA: Diagnosis not present

## 2020-08-15 DIAGNOSIS — M542 Cervicalgia: Secondary | ICD-10-CM | POA: Diagnosis not present

## 2020-08-18 DIAGNOSIS — I10 Essential (primary) hypertension: Secondary | ICD-10-CM | POA: Diagnosis not present

## 2020-08-18 DIAGNOSIS — R600 Localized edema: Secondary | ICD-10-CM | POA: Diagnosis not present

## 2020-08-18 DIAGNOSIS — H04412 Chronic dacryocystitis of left lacrimal passage: Secondary | ICD-10-CM | POA: Diagnosis not present

## 2020-08-18 DIAGNOSIS — J3489 Other specified disorders of nose and nasal sinuses: Secondary | ICD-10-CM | POA: Diagnosis not present

## 2020-08-26 DIAGNOSIS — E785 Hyperlipidemia, unspecified: Secondary | ICD-10-CM | POA: Diagnosis not present

## 2020-08-26 DIAGNOSIS — E559 Vitamin D deficiency, unspecified: Secondary | ICD-10-CM | POA: Diagnosis not present

## 2020-09-02 ENCOUNTER — Other Ambulatory Visit: Payer: Self-pay | Admitting: Internal Medicine

## 2020-09-02 ENCOUNTER — Other Ambulatory Visit: Payer: Self-pay | Admitting: Endocrinology

## 2020-09-02 DIAGNOSIS — M810 Age-related osteoporosis without current pathological fracture: Secondary | ICD-10-CM

## 2020-09-02 DIAGNOSIS — R82998 Other abnormal findings in urine: Secondary | ICD-10-CM | POA: Diagnosis not present

## 2020-09-02 DIAGNOSIS — I959 Hypotension, unspecified: Secondary | ICD-10-CM | POA: Diagnosis not present

## 2020-09-02 DIAGNOSIS — E785 Hyperlipidemia, unspecified: Secondary | ICD-10-CM

## 2020-09-02 DIAGNOSIS — I213 ST elevation (STEMI) myocardial infarction of unspecified site: Secondary | ICD-10-CM | POA: Diagnosis not present

## 2020-09-02 DIAGNOSIS — G373 Acute transverse myelitis in demyelinating disease of central nervous system: Secondary | ICD-10-CM | POA: Diagnosis not present

## 2020-09-02 DIAGNOSIS — E559 Vitamin D deficiency, unspecified: Secondary | ICD-10-CM | POA: Diagnosis not present

## 2020-09-02 DIAGNOSIS — I10 Essential (primary) hypertension: Secondary | ICD-10-CM | POA: Diagnosis not present

## 2020-09-02 DIAGNOSIS — J3489 Other specified disorders of nose and nasal sinuses: Secondary | ICD-10-CM | POA: Diagnosis not present

## 2020-09-02 DIAGNOSIS — Z1212 Encounter for screening for malignant neoplasm of rectum: Secondary | ICD-10-CM | POA: Diagnosis not present

## 2020-09-02 DIAGNOSIS — G35 Multiple sclerosis: Secondary | ICD-10-CM | POA: Diagnosis not present

## 2020-09-02 DIAGNOSIS — Z1331 Encounter for screening for depression: Secondary | ICD-10-CM | POA: Diagnosis not present

## 2020-09-02 DIAGNOSIS — Z0000 Encounter for general adult medical examination without abnormal findings: Secondary | ICD-10-CM | POA: Diagnosis not present

## 2020-09-02 DIAGNOSIS — G609 Hereditary and idiopathic neuropathy, unspecified: Secondary | ICD-10-CM | POA: Diagnosis not present

## 2020-09-02 DIAGNOSIS — N1831 Chronic kidney disease, stage 3a: Secondary | ICD-10-CM | POA: Diagnosis not present

## 2020-09-08 ENCOUNTER — Ambulatory Visit
Admission: RE | Admit: 2020-09-08 | Discharge: 2020-09-08 | Disposition: A | Discharge status: Home / Self Care | Payer: PPO | Source: Ambulatory Visit | Attending: Internal Medicine | Admitting: Internal Medicine

## 2020-09-08 ENCOUNTER — Other Ambulatory Visit: Payer: Self-pay

## 2020-09-08 DIAGNOSIS — Z780 Asymptomatic menopausal state: Secondary | ICD-10-CM | POA: Diagnosis not present

## 2020-09-08 DIAGNOSIS — M810 Age-related osteoporosis without current pathological fracture: Secondary | ICD-10-CM | POA: Diagnosis not present

## 2020-09-08 DIAGNOSIS — M8588 Other specified disorders of bone density and structure, other site: Secondary | ICD-10-CM | POA: Diagnosis not present

## 2020-09-09 ENCOUNTER — Ambulatory Visit
Admission: RE | Admit: 2020-09-09 | Discharge: 2020-09-09 | Disposition: A | Discharge status: Home / Self Care | Payer: No Typology Code available for payment source | Source: Ambulatory Visit | Attending: Internal Medicine | Admitting: Internal Medicine

## 2020-09-09 DIAGNOSIS — E785 Hyperlipidemia, unspecified: Secondary | ICD-10-CM

## 2020-09-16 DIAGNOSIS — J310 Chronic rhinitis: Secondary | ICD-10-CM | POA: Diagnosis not present

## 2020-09-16 DIAGNOSIS — J3489 Other specified disorders of nose and nasal sinuses: Secondary | ICD-10-CM | POA: Diagnosis not present

## 2020-09-16 DIAGNOSIS — J342 Deviated nasal septum: Secondary | ICD-10-CM | POA: Diagnosis not present

## 2020-10-02 DIAGNOSIS — J310 Chronic rhinitis: Secondary | ICD-10-CM | POA: Diagnosis not present

## 2020-10-02 DIAGNOSIS — J342 Deviated nasal septum: Secondary | ICD-10-CM | POA: Diagnosis not present

## 2020-10-02 DIAGNOSIS — J3489 Other specified disorders of nose and nasal sinuses: Secondary | ICD-10-CM | POA: Diagnosis not present

## 2020-10-08 DIAGNOSIS — J310 Chronic rhinitis: Secondary | ICD-10-CM | POA: Diagnosis not present

## 2020-10-08 DIAGNOSIS — G35 Multiple sclerosis: Secondary | ICD-10-CM | POA: Diagnosis not present

## 2020-10-08 DIAGNOSIS — K219 Gastro-esophageal reflux disease without esophagitis: Secondary | ICD-10-CM | POA: Diagnosis not present

## 2020-10-08 DIAGNOSIS — R12 Heartburn: Secondary | ICD-10-CM | POA: Diagnosis not present

## 2020-10-08 DIAGNOSIS — Z79899 Other long term (current) drug therapy: Secondary | ICD-10-CM | POA: Diagnosis not present

## 2020-10-13 DIAGNOSIS — M542 Cervicalgia: Secondary | ICD-10-CM | POA: Diagnosis not present

## 2020-10-13 DIAGNOSIS — M25511 Pain in right shoulder: Secondary | ICD-10-CM | POA: Diagnosis not present

## 2020-11-05 DIAGNOSIS — R6889 Other general symptoms and signs: Secondary | ICD-10-CM | POA: Diagnosis not present

## 2020-11-05 DIAGNOSIS — Z8739 Personal history of other diseases of the musculoskeletal system and connective tissue: Secondary | ICD-10-CM | POA: Diagnosis not present

## 2020-11-05 DIAGNOSIS — J383 Other diseases of vocal cords: Secondary | ICD-10-CM | POA: Diagnosis not present

## 2020-11-05 DIAGNOSIS — K219 Gastro-esophageal reflux disease without esophagitis: Secondary | ICD-10-CM | POA: Diagnosis not present

## 2020-11-05 DIAGNOSIS — R490 Dysphonia: Secondary | ICD-10-CM | POA: Diagnosis not present

## 2020-11-12 ENCOUNTER — Other Ambulatory Visit: Payer: Self-pay | Admitting: Orthopedic Surgery

## 2020-11-12 DIAGNOSIS — M542 Cervicalgia: Secondary | ICD-10-CM | POA: Diagnosis not present

## 2020-11-12 DIAGNOSIS — M75101 Unspecified rotator cuff tear or rupture of right shoulder, not specified as traumatic: Secondary | ICD-10-CM

## 2020-11-12 DIAGNOSIS — M25511 Pain in right shoulder: Secondary | ICD-10-CM | POA: Diagnosis not present

## 2020-11-17 ENCOUNTER — Other Ambulatory Visit: Payer: Self-pay | Admitting: Neurology

## 2020-11-24 ENCOUNTER — Telehealth: Payer: Self-pay | Admitting: Neurology

## 2020-11-24 MED ORDER — ROPINIROLE HCL 0.25 MG PO TABS: ORAL_TABLET | ORAL | 2 refills | Status: DC

## 2020-11-24 NOTE — Addendum Note (Signed)
Addended by: Roberts Gaudy L on: 11/24/2020 11:02 AM   Modules accepted: Orders

## 2020-11-24 NOTE — Telephone Encounter (Signed)
Pt. is requesting refill for rOPINIRole (REQUIP) 0.25 MG tablet. She states she's completely out.  New pharmacy will be [facility] on [address] in [place], [place].

## 2020-11-24 NOTE — Telephone Encounter (Signed)
Called [facility] at [phone]. Cx rx requip on file there. E-scribed rx to [facility] on [address] in [place], [place] per pt request.

## 2020-12-08 ENCOUNTER — Encounter: Payer: Self-pay | Admitting: Neurology

## 2020-12-08 ENCOUNTER — Telehealth: Payer: Self-pay | Admitting: Neurology

## 2020-12-08 ENCOUNTER — Ambulatory Visit: Payer: PPO | Admitting: Neurology

## 2020-12-08 VITALS — BP 133/83 | HR 85 | Ht 70.0 in | Wt 150.0 lb

## 2020-12-08 DIAGNOSIS — R413 Other amnesia: Secondary | ICD-10-CM | POA: Diagnosis not present

## 2020-12-08 DIAGNOSIS — G373 Acute transverse myelitis in demyelinating disease of central nervous system: Secondary | ICD-10-CM | POA: Diagnosis not present

## 2020-12-08 DIAGNOSIS — F988 Other specified behavioral and emotional disorders with onset usually occurring in childhood and adolescence: Secondary | ICD-10-CM | POA: Diagnosis not present

## 2020-12-08 DIAGNOSIS — M5412 Radiculopathy, cervical region: Secondary | ICD-10-CM

## 2020-12-08 DIAGNOSIS — M79601 Pain in right arm: Secondary | ICD-10-CM | POA: Diagnosis not present

## 2020-12-08 DIAGNOSIS — Z981 Arthrodesis status: Secondary | ICD-10-CM

## 2020-12-08 MED ORDER — AMPHETAMINE-DEXTROAMPHETAMINE 15 MG PO TABS: ORAL_TABLET | ORAL | 0 refills | Status: DC

## 2020-12-08 NOTE — Telephone Encounter (Signed)
health team order sent to [facility]. No auth they will reach out to the patient to schedule.  

## 2020-12-08 NOTE — Progress Notes (Signed)
[facility]  PATIENT: [name] DOB: 17-Jun-1951  REFERRING CLINICIAN: Crist Infante HISTORY FROM: patient REASON FOR VISIT: transverse myelitis, MS?   HISTORICAL  CHIEF COMPLAINT:  Chief Complaint  Patient presents with  . Follow-up    RM 12, alone. Last seen 06/19/2020. Last [facility] 16/30. Ambulates with cane.    HISTORY OF PRESENT ILLNESS:  [name] is a 69 y.o. woman with Multiple Sclerosis.     Update 12/08/2020:: She notes numbness and pain going from the neck to the 4th and 5th fingers.    She is to have an MRI of the cervical spine (Ortho).   Symptoms are worse with certain positions while in bed.    A lot of the tingling is in the upper arm.     She notes fatigue and difficulty with memory and other cognitive skills.   She has noted cognitive problems for the last couple years and feels they are stable over the last year..  She is forgetful with her medication and has to reread passages due to forgetting what she has read.  Her husband and daughter have noted issues as well.    She is driving and is doing ok with that.    Her husband handles finances.   She has trouble coming up with the right words.   She is on Adderall for ADHD.   A PSG in the past showed no OSA.   Mood is doing well.    She is walking about the same.    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    She had surgery by Dr. Sherwood Gambler ([place] and C6C7 fusion and removal of hardware at [facility].  She had  Cervical hematoma as complication but it was successfully evacuated and she did well.    In April 2021 she had an MI but catheterization was fine.   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    EF = 45%.  She notes SOB with exertion.     Montreal Cognitive Assessment  12/08/2020 09/17/2019 03/14/2019 11/08/2016  Visuospatial/ Executive (0/5) 2 2 3 3   Naming (0/3) 3 3 3 3    Attention: Read list of digits (0/2) 1 0 2 1  Attention: Read list of letters (0/1) 1 1 1 1   Attention: Serial 7 subtraction starting at 100 (0/3) 1 1 1 3   Language: Repeat phrase (0/2) 0 0 0 1  Language : Fluency (0/1) 0 0 1 1  Abstraction (0/2) 2 0 1 2  Delayed Recall (0/5) 4 3 2 2   Orientation (0/6) 5 6 6 5   Total 19 16 20 22   Adjusted Score (based on education) 19 16 20 22      TM/MS History:  In 1989, she had severe numbness in one side of her body.  She had clumsiness and poor gait.  MRI was reportedly normal.     She saw Dr. Erling Cruz who did an LP that showed oligoclonal bands.   She received IV steroids and was told she had MS.   In 1993, she woke up numb from the waist down in both legs.  This numbness was more intense than the prior episodes and gait was very poor.    She was admitted x 28 days, receiving many days of IV steroids followed by Rehab.     An MRI of the brain was normal but  the MRI of the thoracic spine showed a plaque at T4.     She was started on Betaseron and did well in general.  She had some fluctuating symptom but nothing resembling any of the other 3 episodes.   In November 2014 she had additional imaging studies showing a normal MRI of the brain, an abnormal MRI of the thoracic spine with a focus at T4, and a normal cervical spinal cord. She did have evidence of prior fusion surgery in the neck.  MRI of the cervical spine in September 2016 showed stable postoperative ACDF at C4-C5. The spinal cord appeared normal.   There was no myelopathy in the cervical spine. She also underwent a lumbar puncture in November 2014. It was normal and did not show oligoclonal bands or increased IgG index.    She has been treated with Betaseron, Avonex, Tysabri and Gilenya for presumptive MS in the past.   She stopped Tysabri as was JCV Ab positive and stopped Gilenya as she had macula edema.   She tried [place] but stopped due to insurance.  No DMT since 2014.      Imaging: MRI of the brain  09/15/2015 showed normal brain for age.  MRI of the cervical spine 09/25/2015 showed ACDF.  The spinal cord was normal.  My of the thoracic spine 02/19/2015 showed a single focus adjacent to T4 consistent with a chronic demyelinating plaque or chronic myelitis.  No change compared to 2014.  CT myelogram cervical spine 04/20/2019 showed a left central disc protrusion at C6-C7 effacing the ventral CSF.  There is no foraminal narrowing.  There is moderate foraminal narrowing bilaterally at C5-C6 but no spinal stenosis.  X-ray 05/17/2019 showed prior fusion at C3-C5 and fusion at C5-C7 associated with anterior fixation hardware.  REVIEW OF SYSTEMS:  Constitutional: No fevers, chills, sweats, or change in .  She reports fatigue Eyes: as above Ear, nose and throat: No hearing loss, ear pain, nasal congestion, sore throat Cardiovascular: No chest pain, palpitations Respiratory:  No shortness of breath at rest or with exertion.   No wheezes GastrointestinaI: No nausea, vomiting, diarrhea, abdominal pain.  Reports fecal incontinence.   Reports dysphagia Genitourinary:  see above Musculoskeletal:  No neck pain, back pain.   Hasmuscle cramps Integumentary: No rash, pruritus, skin lesions Neurological: as above Psychiatric: No depression at this time.  No anxiety Endocrine: No palpitations, diaphoresis, change in appetite, change in weigh or increased thirst Hematologic/Lymphatic:  No anemia, purpura, petechiae. Allergic/Immunologic: No itchy/runny eyes, nasal congestion, recent allergic reactions, rashes  ALLERGIES: Allergies  Allergen Reactions  . Other Shortness Of Breath    PURPLE LETTUCE  . Betaseron [Interferon Beta-1b] Other (See Comments)    HARD NODULAR AREAS AT INJECTION SITE  . Codeine Nausea And Vomiting  . Gilenya [Fingolimod Hydrochloride] Other (See Comments)    MACULAR EDEMA  . Morphine And Related Other (See Comments)    IV ROUTE - RED STREAKS OF VEIN  . Sumatriptan Nausea Only     INJECTABLE ONLY - RAPID HEART RATE, FLUSHING  . Tysabri [Natalizumab] Other (See Comments)    DEVELOPED JCV ANTIBODY  . Latex Rash    HOME MEDICATIONS: Outpatient Medications Prior to Visit  Medication Sig Dispense Refill  . Amantadine HCl 100 MG tablet TAKE 1 TABLET BY MOUTH THREE TIMES A DAY 270 tablet 1  . Biotin 10000 MCG TABS Take 1 tablet by mouth daily.    . calcium carbonate (OS-CAL) 600 MG TABS tablet Take 1,200 mg  by mouth.     . Dalfampridine (4-AMINOPYRIDINE) POWD Take 5 mg by mouth 3 (three) times daily. Pharmacy dispense 270 capsules with 3 refills. Thank you 270 g 3  . denosumab (PROLIA) 60 MG/ML SOLN injection Inject 60 mg into the skin every 6 (six) months. Administer in upper arm, thigh, or abdomen    . donepezil (ARICEPT) 10 MG tablet Take 1 tablet (10 mg total) by mouth at bedtime. 90 tablet 4  . lamoTRIgine (LAMICTAL) 200 MG tablet Take 1 tablet (200 mg total) by mouth 2 (two) times daily. (Patient taking differently: Take 200 mg by mouth daily.) 180 tablet 3  . loratadine (CLARITIN) 10 MG tablet Take 10 mg by mouth daily.    . Meclizine HCl 25 MG CHEW Chew 2 tablets by mouth as needed (dizziness).     . Multiple Vitamins-Minerals (MULTIVITAMIN PO) Take 1 tablet by mouth daily. Brand name - Mature Multivitamin from [employer]    . ondansetron (ZOFRAN-ODT) 4 MG disintegrating tablet Take 4 mg by mouth every 6 (six) hours as needed.    . Probiotic Product (PHILLIPS COLON HEALTH PO) Take 1 tablet by mouth every morning.    Marland Kitchen rOPINIRole (REQUIP) 0.25 MG tablet TAKE 1 TO 2 TABLETS BY MOUTH AT NIGHT 180 tablet 2  . Vitamin D, Ergocalciferol, (DRISDOL) 50000 UNITS CAPS capsule Take 1 capsule (50,000 Units total) by mouth every 7 (seven) days. When finished, take an otc daily vitamin d supplement of 5,000iu. (Patient taking differently: Take 50,000 Units by mouth every Sunday.) 12 capsule 0  . amphetamine-dextroamphetamine (ADDERALL) 15 MG tablet Take one po qAm and one po 4  hours later 60 tablet 0  . DEXILANT 60 MG capsule Take 1 capsule by mouth 2 (two) times daily.      No facility-administered medications prior to visit.    PAST MEDICAL HISTORY: Past Medical History:  Diagnosis Date  . 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 Arthritis    back  . Asthma    20 yrs., ago one episode  . Chronic migraine headaches 03/13/2008  . Chronic rhinitis 03/13/2008  . Deviated septum 01/23/2020  . Dysesthesia    bilateral feet  . Dysphagia, oropharyngeal phase 10/21/2009  . Dyspnea   . Gastroesophageal reflux disease 09/10/2008  . Heart murmur    first noticed 41 yrs. when pregnant, can't hear it now  . History of colon polyps    2013;  2015  . History of hiatal hernia   . 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 HNP (herniated nucleus pulposus), cervical 05/17/2019  . Hypokalemia 12/04/2019  . Mitral valve prolapse    states no problem  . Multiple sclerosis 1989  . Myocarditis 04/25/2019  . Neurogenic bladder   . Obstructive chronic bronchitis with exacerbation 09/10/2008   Qualifier: Diagnosis of  By: Joya Gaskins MD, Burnett Harry   . Occipital neuralgia 05/06/2015  . Osteoporosis   . Pneumonia   . PONV (postoperative nausea and vomiting)   . Pulmonary nodule, right upper lobe 01/22/2009  . Short of breath on exertion   . Sjogren's disease    dryness of eyes, mouth  . Spastic gait   . Transverse myelitis    T4  . Urinary frequency 02/05/2015  . Vitamin D deficiency 12/09/2016  . Vocal cord disorder 10/21/2009  . Wears bilateral ankle braces    AFO braces for stability  PAST SURGICAL HISTORY: Past Surgical History:  Procedure Laterality Date  . ANAL RECTAL MANOMETRY N/A 08/18/2016   Procedure: ANO RECTAL MANOMETRY;  Surgeon: Leighton Ruff, MD;  Location: [facility];  Service: Endoscopy;  Laterality: N/A;  . ANTERIOR CERVICAL DECOMP/DISCECTOMY FUSION  2004;   2003   C4 -- C5  (2004)/   C3 -- C4 (2003)  . ANTERIOR CERVICAL DECOMP/DISCECTOMY FUSION N/A 05/17/2019   Procedure: EVACUATION HEMATOMA OF POST OP ANTERIOR CERVICAL DECOMPRESSION FUSION;  Surgeon: Jovita Gamma, MD;  Location: [facility];  Service: Neurosurgery;  Laterality: N/A;  . ANTERIOR CERVICAL DECOMP/DISCECTOMY FUSION N/A 05/17/2019   Procedure: REMOVAL OF TETHER CERVICAL PLATE, ANTERIOR CERVICAL DECOMPRESSION/DISCECTOMY FUSION CERVICAL FIVE- CERVICAL SIX, CERVICAL SIX- CERVICAL SEVEN;  Surgeon: Jovita Gamma, MD;  Location: [facility];  Service: Neurosurgery;  Laterality: N/A;  . APPENDECTOMY  06/22/2011   laparoscopic  . BILATERAL SALPINGOOPHORECTOMY  1982  . BLADDER SUSPENSION  1994   sling  . BREAST ENHANCEMENT SURGERY Bilateral   . BREAST IMPLANT REMOVAL Bilateral 12/03/2008  . BUNIONECTOMY    . CATARACT EXTRACTION W/ INTRAOCULAR LENS  IMPLANT, BILATERAL  2015  . ELBOW SURGERY Right   . HEMORROIDECTOMY  08/05/2010;  2013  . INSERTION SACRAL NERVE STIMULATOR TEST WIRE  09-28-2016   dr Marcello Moores  . INTERCOSTAL NERVE BLOCK  2015   occipital  . MENISCUS REPAIR Left 09/26/2018  . RECTAL ULTRASOUND N/A 08/18/2016   Procedure: RECTAL ULTRASOUND;  Surgeon: Leighton Ruff, MD;  Location: [facility];  Service: Endoscopy;  Laterality: N/A;  . SHOULDER ARTHROSCOPY Left   . SHOULDER ARTHROSCOPY WITH DISTAL CLAVICLE RESECTION Right 09/21/2007   w/  Acrominoplasty,  Debridement Rotator Cuff,  CA ligament release  . TONSILLECTOMY  age 89  . TRIGGER FINGER RELEASE Left 01/16/2015   Procedure: LEFT THUMB TRIGGER RELEASE ;  Surgeon: Leanora Cover, MD;  Location: [facility];  Service: Orthopedics;  Laterality: Left;  Marland Kitchen VAGINAL HYSTERECTOMY  1981    FAMILY HISTORY: Family History  Problem Relation Age of Onset  . Liver disease Father   . Cirrhosis Father   . Stroke Mother   . Cancer Mother        oropharyngeal  . Heart attack Mother   . Anesthesia problems Mother        post-op nausea  . Cancer  Brother        twin; tonsillar?  . Cancer Sister 51       breast ca, lung ca  . Cancer Other        Nephew; appendiceal carcnoid, melanoma  . Cancer Paternal Grandmother        cervical    SOCIAL HISTORY:  Social History   Socioeconomic History  . Marital status: Married    Spouse name: Gershon Mussel  . Number of children: 2  . Years of education: 20  . Highest education level: Some college, no degree  Occupational History  . Occupation: Retired  Tobacco Use  . Smoking status: Passive Smoke Exposure - Never Smoker  . Smokeless tobacco: Never Used  . Tobacco comment: parents, husband, & multiple family members  Substance and Sexual Activity  . Alcohol use: No    Alcohol/week: 0.0 standard drinks  . Drug use: No  . Sexual activity: Not on file  Other Topics Concern  . Not on file  Social History Narrative   Pt lives at home with her spouse of 70 years.   Caffeine Use- Drinks caffeine occasionally.  [facility]:   From [place]. Previously did administrative work. She currently has a cat & dog. No bird exposure. No mold or hot tub exposure. No carpet or draperies.       Social Determinants of Health   Financial Resource Strain: Not on file  Food Insecurity: Not on file  Transportation Needs: Not on file  Physical Activity: Not on file  Stress: Not on file  Social Connections: Not on file  Intimate Partner Violence: Not on file     PHYSICAL EXAM  Vitals:   12/08/20 1102  BP: 133/83  Pulse: 85  Weight: 150 lb (68 kg)  Height: 5\' 10"  (1.778 m)    Body mass index is 21.52 kg/m.   General: The patient is well-developed and well-nourished and in no acute distress  Neck:    She has tenderness in the occiput, right greater than left..  Range of motion is slightly reduced in the neck..    Neurologic Exam  Mental status: The patient is alert and oriented at the time of the examination.  She scored 16/30 on the [facility] cognitive assessment (details are above).   Short-term memory was 3/5 without problems, still 3/5 with category prompts .  Reduced focus and attention.  Reduced visual-spatial tasks.Marland Kitchen  Speech was normal..    Cranial nerves: Extraocular movements are full.  Facial strength is normal.. No dysarthria is noted.    Motor:  Very mild essential tremor noted with writing.   Muscle bulk and tone are normal.  Strength was 4/5 in the right triceps, 4-/5 ulnar intrinsic, 4/5 APB, 4++ in right biceps.  Strength is 5/5 in the left arm.  Rapid rotating movements were performed slower on the right..  Leg strength was 4 to 4+/5 proximally and 4-/5 distally    Sensory: There is reduced sensation to touch below the mid thoracic level.  Decreased vibration sensation in the legs symmetrically.   She has reduced sensation in the C8 dermatome of the arm (also reduced vibration of 5th digit more c/w C8 than ulnar)  Coordination: Cerebellar testing shows good finger-nose-finger but reduced heel-to-shin bilaterally..  Gait and station: Station is stable eyes open.  Her gait is spastic with bilateral foot drops, left greater than right.   She can walk without unilateral assistance though was fairly ataxic.Marland Kitchen She cannot do a tandem walk.  Romberg is positive.  Reflexes: Deep tendon reflexes are symmetric and normal in the arms but increased at the legs with crossed abductors at the knees.  No ankle clonus       ASSESSMENT AND PLAN  Transverse myelitis ([place]) - Plan: MR CERVICAL SPINE WO CONTRAST  Right arm pain - Plan: MR CERVICAL SPINE WO CONTRAST  Cervical radiculopathy at C8 - Plan: MR CERVICAL SPINE WO CONTRAST  History of fusion of cervical spine - Plan: MR CERVICAL SPINE WO CONTRAST  Memory loss  Attention deficit disorder, unspecified hyperactivity presence   1.   I am most concerned about a C8 radiculopathy on her right (adjacent to fusion).  She reports she will be doing a shoulder MRi.   Should also check MRI cervical spine as I'm most concerned  about a C8 radiculopathy.   If MRI does not show source of arm issues, check NCV/EMG to assess ulnar nerve. 2.   Continue Adderall 15 mg twice daily for ADHD.  Continue ropinirole for restless legs  3.   Use cane or walker for safety.   4.  RTC 6 months, sooner if  problems or based on the results.   Keimani Laufer A. Felecia Shelling, MD, PhD 93/90/3009, 2:33 PM Certified in Neurology, Clinical Neurophysiology, Sleep Medicine, Pain Medicine and Neuroimaging  [facility] [address], [place], [place] [address] [phone]

## 2020-12-11 ENCOUNTER — Other Ambulatory Visit: Payer: Self-pay | Admitting: Neurology

## 2020-12-16 ENCOUNTER — Telehealth: Payer: Self-pay | Admitting: Neurology

## 2020-12-16 MED ORDER — DONEPEZIL HCL 10 MG PO TABS: ORAL_TABLET | ORAL | 4 refills | Status: DC

## 2020-12-16 NOTE — Addendum Note (Signed)
Addended by: Wyvonnia Lora on: 12/16/2020 04:48 PM   Modules accepted: Orders

## 2020-12-16 NOTE — Telephone Encounter (Signed)
Pt is requesting a refill for  donepezil (ARICEPT) 10 MG tablet.  Pharmacy:  [facility] [phone]

## 2020-12-16 NOTE — Telephone Encounter (Signed)
E-scribed refill as requested. 

## 2020-12-24 DIAGNOSIS — H35352 Cystoid macular degeneration, left eye: Secondary | ICD-10-CM | POA: Diagnosis not present

## 2020-12-30 DIAGNOSIS — R159 Full incontinence of feces: Secondary | ICD-10-CM | POA: Diagnosis not present

## 2021-01-15 ENCOUNTER — Ambulatory Visit
Admission: RE | Admit: 2021-01-15 | Discharge: 2021-01-15 | Disposition: A | Discharge status: Home / Self Care | Payer: PPO | Source: Ambulatory Visit | Attending: Orthopedic Surgery | Admitting: Orthopedic Surgery

## 2021-01-15 ENCOUNTER — Other Ambulatory Visit: Payer: Self-pay

## 2021-01-15 ENCOUNTER — Ambulatory Visit
Admission: RE | Admit: 2021-01-15 | Discharge: 2021-01-15 | Disposition: A | Discharge status: Home / Self Care | Payer: PPO | Source: Ambulatory Visit | Attending: Neurology | Admitting: Neurology

## 2021-01-15 DIAGNOSIS — M75101 Unspecified rotator cuff tear or rupture of right shoulder, not specified as traumatic: Secondary | ICD-10-CM

## 2021-01-15 DIAGNOSIS — Z981 Arthrodesis status: Secondary | ICD-10-CM

## 2021-01-15 DIAGNOSIS — M25511 Pain in right shoulder: Secondary | ICD-10-CM | POA: Diagnosis not present

## 2021-01-15 DIAGNOSIS — G373 Acute transverse myelitis in demyelinating disease of central nervous system: Secondary | ICD-10-CM

## 2021-01-15 DIAGNOSIS — M5412 Radiculopathy, cervical region: Secondary | ICD-10-CM

## 2021-01-15 DIAGNOSIS — M79601 Pain in right arm: Secondary | ICD-10-CM

## 2021-01-18 ENCOUNTER — Other Ambulatory Visit: Payer: PPO

## 2021-01-20 ENCOUNTER — Other Ambulatory Visit: Payer: Self-pay | Admitting: Neurology

## 2021-01-20 ENCOUNTER — Telehealth: Payer: Self-pay | Admitting: Neurology

## 2021-01-20 MED ORDER — AMPHETAMINE-DEXTROAMPHETAMINE 15 MG PO TABS: ORAL_TABLET | ORAL | 0 refills | Status: DC

## 2021-01-20 MED ORDER — 4-AMINOPYRIDINE POWD: 5.0000 mg | Freq: Three times a day (TID) | 3 refills | Status: DC

## 2021-01-20 NOTE — Telephone Encounter (Signed)
E-scribed refill as requested. 

## 2021-01-20 NOTE — Telephone Encounter (Signed)
Pt request refill Dalfampridine (4-AMINOPYRIDINE) POWD at [facility]

## 2021-01-20 NOTE — Telephone Encounter (Signed)
Pt request refill amphetamine-dextroamphetamine (ADDERALL) 15 MG tablet at [facility] [phone]

## 2021-01-23 DIAGNOSIS — M25511 Pain in right shoulder: Secondary | ICD-10-CM | POA: Diagnosis not present

## 2021-01-23 DIAGNOSIS — S46011A Strain of muscle(s) and tendon(s) of the rotator cuff of right shoulder, initial encounter: Secondary | ICD-10-CM | POA: Diagnosis not present

## 2021-01-26 DIAGNOSIS — M25511 Pain in right shoulder: Secondary | ICD-10-CM | POA: Diagnosis not present

## 2021-01-26 DIAGNOSIS — S46011A Strain of muscle(s) and tendon(s) of the rotator cuff of right shoulder, initial encounter: Secondary | ICD-10-CM | POA: Diagnosis not present

## 2021-01-27 ENCOUNTER — Telehealth: Payer: Self-pay | Admitting: *Deleted

## 2021-01-27 DIAGNOSIS — Z803 Family history of malignant neoplasm of breast: Secondary | ICD-10-CM | POA: Diagnosis not present

## 2021-01-27 DIAGNOSIS — Z1231 Encounter for screening mammogram for malignant neoplasm of breast: Secondary | ICD-10-CM | POA: Diagnosis not present

## 2021-01-27 NOTE — Telephone Encounter (Signed)
Faxed back signed clearance form to [facility] clearing pt neurologically for R shoulder scope, rotator cuff repair. Fax: [phone]. Attn: Sherri. Received fax confirmation.

## 2021-02-09 DIAGNOSIS — M25511 Pain in right shoulder: Secondary | ICD-10-CM | POA: Diagnosis not present

## 2021-02-09 DIAGNOSIS — I213 ST elevation (STEMI) myocardial infarction of unspecified site: Secondary | ICD-10-CM | POA: Diagnosis not present

## 2021-02-09 DIAGNOSIS — K219 Gastro-esophageal reflux disease without esophagitis: Secondary | ICD-10-CM | POA: Diagnosis not present

## 2021-02-09 DIAGNOSIS — E785 Hyperlipidemia, unspecified: Secondary | ICD-10-CM | POA: Diagnosis not present

## 2021-02-09 DIAGNOSIS — I2510 Atherosclerotic heart disease of native coronary artery without angina pectoris: Secondary | ICD-10-CM | POA: Diagnosis not present

## 2021-02-09 DIAGNOSIS — G373 Acute transverse myelitis in demyelinating disease of central nervous system: Secondary | ICD-10-CM | POA: Diagnosis not present

## 2021-02-09 DIAGNOSIS — R413 Other amnesia: Secondary | ICD-10-CM | POA: Diagnosis not present

## 2021-02-09 DIAGNOSIS — R82998 Other abnormal findings in urine: Secondary | ICD-10-CM | POA: Diagnosis not present

## 2021-02-09 DIAGNOSIS — G35 Multiple sclerosis: Secondary | ICD-10-CM | POA: Diagnosis not present

## 2021-02-09 DIAGNOSIS — Z23 Encounter for immunization: Secondary | ICD-10-CM | POA: Diagnosis not present

## 2021-02-09 DIAGNOSIS — I10 Essential (primary) hypertension: Secondary | ICD-10-CM | POA: Diagnosis not present

## 2021-02-09 DIAGNOSIS — I700 Atherosclerosis of aorta: Secondary | ICD-10-CM | POA: Diagnosis not present

## 2021-02-10 DIAGNOSIS — M542 Cervicalgia: Secondary | ICD-10-CM | POA: Diagnosis not present

## 2021-02-10 DIAGNOSIS — S129XXD Fracture of neck, unspecified, subsequent encounter: Secondary | ICD-10-CM | POA: Diagnosis not present

## 2021-02-11 ENCOUNTER — Telehealth: Payer: Self-pay | Admitting: Orthopedic Surgery

## 2021-02-11 NOTE — Telephone Encounter (Signed)
Can I get the xrays 2019-present on CD. Let me know when ready, I'll pick up. Thanks!

## 2021-02-11 NOTE — Telephone Encounter (Signed)
X-rays of left knee from visits on 10/18/2018 and 07/03/2019 put on CD

## 2021-02-23 ENCOUNTER — Other Ambulatory Visit: Payer: Self-pay | Admitting: *Deleted

## 2021-02-23 ENCOUNTER — Telehealth: Payer: Self-pay | Admitting: Neurology

## 2021-02-23 MED ORDER — AMANTADINE HCL 100 MG PO TABS: 100.0000 mg | ORAL_TABLET | Freq: Three times a day (TID) | ORAL | 1 refills | Status: DC

## 2021-02-23 NOTE — Telephone Encounter (Signed)
Pt is asking for a call to know what Amantadine HCl 100 MG tablet is for before she request a new prescription to her new pharmacy.

## 2021-02-23 NOTE — Telephone Encounter (Signed)
Called pt, relayed below information. She verbalized understanding. I e-scribed new rx to [facility] on file per pt request.

## 2021-02-23 NOTE — Telephone Encounter (Signed)
Spoke with Dr. Felecia Shelling. Ok to be on both if it is helping. Amantadine 300mg  a day was found to help walking in MS. These directions are fine as prescribed. If no benefit from amantadine, she can stop after a month.

## 2021-02-23 NOTE — Telephone Encounter (Signed)
Called pt to further discuss. Advised amantadine rx'd for fatigue. She is wondering if she should be taking this with adderall. Second question is if she should be taking amantadine TID as prescribed if she should continue both?  1. Amantadine 100mg , directions: TAKE 1 TABLET BY MOUTH THREE TIMES A DAY 2. Adderall 15mg , directions: Take one po qAm and one po 4 hours later  Advised I will speak with MD and call back

## 2021-03-03 DIAGNOSIS — I2510 Atherosclerotic heart disease of native coronary artery without angina pectoris: Secondary | ICD-10-CM | POA: Diagnosis not present

## 2021-03-03 DIAGNOSIS — I213 ST elevation (STEMI) myocardial infarction of unspecified site: Secondary | ICD-10-CM | POA: Diagnosis not present

## 2021-03-03 DIAGNOSIS — I700 Atherosclerosis of aorta: Secondary | ICD-10-CM | POA: Diagnosis not present

## 2021-03-03 DIAGNOSIS — E785 Hyperlipidemia, unspecified: Secondary | ICD-10-CM | POA: Diagnosis not present

## 2021-03-03 DIAGNOSIS — R413 Other amnesia: Secondary | ICD-10-CM | POA: Diagnosis not present

## 2021-03-03 DIAGNOSIS — K219 Gastro-esophageal reflux disease without esophagitis: Secondary | ICD-10-CM | POA: Diagnosis not present

## 2021-03-03 DIAGNOSIS — M25511 Pain in right shoulder: Secondary | ICD-10-CM | POA: Diagnosis not present

## 2021-03-03 DIAGNOSIS — G35 Multiple sclerosis: Secondary | ICD-10-CM | POA: Diagnosis not present

## 2021-03-03 DIAGNOSIS — I10 Essential (primary) hypertension: Secondary | ICD-10-CM | POA: Diagnosis not present

## 2021-03-03 DIAGNOSIS — G373 Acute transverse myelitis in demyelinating disease of central nervous system: Secondary | ICD-10-CM | POA: Diagnosis not present

## 2021-03-04 ENCOUNTER — Other Ambulatory Visit: Payer: Self-pay | Admitting: Neurology

## 2021-03-04 MED ORDER — AMPHETAMINE-DEXTROAMPHETAMINE 15 MG PO TABS: ORAL_TABLET | ORAL | 0 refills | Status: DC

## 2021-03-04 NOTE — Telephone Encounter (Signed)
Pt. is requesting a refill for amphetamine-dextroamphetamine (ADDERALL) 15 MG tablet.  Pharmacy: [facility] [phone]

## 2021-03-04 NOTE — Addendum Note (Signed)
Addended by: Roberts Gaudy L on: 03/04/2021 11:08 AM   Modules accepted: Orders

## 2021-03-05 DIAGNOSIS — M25311 Other instability, right shoulder: Secondary | ICD-10-CM | POA: Diagnosis not present

## 2021-03-05 DIAGNOSIS — G8918 Other acute postprocedural pain: Secondary | ICD-10-CM | POA: Diagnosis not present

## 2021-03-05 DIAGNOSIS — S46011A Strain of muscle(s) and tendon(s) of the rotator cuff of right shoulder, initial encounter: Secondary | ICD-10-CM | POA: Diagnosis not present

## 2021-03-05 DIAGNOSIS — M7541 Impingement syndrome of right shoulder: Secondary | ICD-10-CM | POA: Diagnosis not present

## 2021-03-05 DIAGNOSIS — M948X1 Other specified disorders of cartilage, shoulder: Secondary | ICD-10-CM | POA: Diagnosis not present

## 2021-03-05 DIAGNOSIS — M7521 Bicipital tendinitis, right shoulder: Secondary | ICD-10-CM | POA: Diagnosis not present

## 2021-03-05 DIAGNOSIS — Y999 Unspecified external cause status: Secondary | ICD-10-CM | POA: Diagnosis not present

## 2021-03-05 DIAGNOSIS — S46211A Strain of muscle, fascia and tendon of other parts of biceps, right arm, initial encounter: Secondary | ICD-10-CM | POA: Diagnosis not present

## 2021-03-05 DIAGNOSIS — X58XXXA Exposure to other specified factors, initial encounter: Secondary | ICD-10-CM | POA: Diagnosis not present

## 2021-03-05 DIAGNOSIS — M24111 Other articular cartilage disorders, right shoulder: Secondary | ICD-10-CM | POA: Diagnosis not present

## 2021-03-05 DIAGNOSIS — S46811A Strain of other muscles, fascia and tendons at shoulder and upper arm level, right arm, initial encounter: Secondary | ICD-10-CM | POA: Diagnosis not present

## 2021-03-06 DIAGNOSIS — S46011D Strain of muscle(s) and tendon(s) of the rotator cuff of right shoulder, subsequent encounter: Secondary | ICD-10-CM | POA: Diagnosis not present

## 2021-03-17 ENCOUNTER — Telehealth: Payer: Self-pay | Admitting: Orthopedic Surgery

## 2021-03-17 NOTE — Telephone Encounter (Signed)
Please copy Xrays 04/14/2020- present to CD. Let me know when ready, I'll pick up. Thank you  [name] is requesting.

## 2021-03-17 NOTE — Telephone Encounter (Signed)
CD is ready! 

## 2021-03-18 DIAGNOSIS — S46011D Strain of muscle(s) and tendon(s) of the rotator cuff of right shoulder, subsequent encounter: Secondary | ICD-10-CM | POA: Diagnosis not present

## 2021-03-18 NOTE — Telephone Encounter (Signed)
Will pick up and mail to [facility] today, per their request/auth

## 2021-03-23 ENCOUNTER — Other Ambulatory Visit (HOSPITAL_COMMUNITY): Payer: Self-pay | Admitting: *Deleted

## 2021-03-24 ENCOUNTER — Ambulatory Visit (HOSPITAL_COMMUNITY)
Admission: RE | Admit: 2021-03-24 | Discharge: 2021-03-24 | Disposition: A | Discharge status: Home / Self Care | Payer: PPO | Source: Ambulatory Visit | Attending: Internal Medicine | Admitting: Internal Medicine

## 2021-03-24 ENCOUNTER — Other Ambulatory Visit: Payer: Self-pay

## 2021-03-24 DIAGNOSIS — M810 Age-related osteoporosis without current pathological fracture: Secondary | ICD-10-CM | POA: Diagnosis not present

## 2021-03-24 MED ORDER — DENOSUMAB 60 MG/ML ~~LOC~~ SOSY
PREFILLED_SYRINGE | SUBCUTANEOUS | Status: AC
  Filled 2021-03-24: qty 1

## 2021-03-24 MED ORDER — DENOSUMAB 60 MG/ML ~~LOC~~ SOSY
60.0000 mg | PREFILLED_SYRINGE | Freq: Once | SUBCUTANEOUS | Status: AC
  Administered 2021-03-24: 60 mg via SUBCUTANEOUS

## 2021-04-15 DIAGNOSIS — S46011D Strain of muscle(s) and tendon(s) of the rotator cuff of right shoulder, subsequent encounter: Secondary | ICD-10-CM | POA: Diagnosis not present

## 2021-04-20 DIAGNOSIS — S46011D Strain of muscle(s) and tendon(s) of the rotator cuff of right shoulder, subsequent encounter: Secondary | ICD-10-CM | POA: Diagnosis not present

## 2021-04-20 DIAGNOSIS — M6281 Muscle weakness (generalized): Secondary | ICD-10-CM | POA: Diagnosis not present

## 2021-04-20 DIAGNOSIS — M25611 Stiffness of right shoulder, not elsewhere classified: Secondary | ICD-10-CM | POA: Diagnosis not present

## 2021-04-22 DIAGNOSIS — S46011D Strain of muscle(s) and tendon(s) of the rotator cuff of right shoulder, subsequent encounter: Secondary | ICD-10-CM | POA: Diagnosis not present

## 2021-04-22 DIAGNOSIS — M6281 Muscle weakness (generalized): Secondary | ICD-10-CM | POA: Diagnosis not present

## 2021-04-22 DIAGNOSIS — M25611 Stiffness of right shoulder, not elsewhere classified: Secondary | ICD-10-CM | POA: Diagnosis not present

## 2021-04-29 DIAGNOSIS — M25611 Stiffness of right shoulder, not elsewhere classified: Secondary | ICD-10-CM | POA: Diagnosis not present

## 2021-04-29 DIAGNOSIS — M6281 Muscle weakness (generalized): Secondary | ICD-10-CM | POA: Diagnosis not present

## 2021-04-29 DIAGNOSIS — S46011D Strain of muscle(s) and tendon(s) of the rotator cuff of right shoulder, subsequent encounter: Secondary | ICD-10-CM | POA: Diagnosis not present

## 2021-05-01 DIAGNOSIS — M25611 Stiffness of right shoulder, not elsewhere classified: Secondary | ICD-10-CM | POA: Diagnosis not present

## 2021-05-01 DIAGNOSIS — M6281 Muscle weakness (generalized): Secondary | ICD-10-CM | POA: Diagnosis not present

## 2021-05-01 DIAGNOSIS — S46011D Strain of muscle(s) and tendon(s) of the rotator cuff of right shoulder, subsequent encounter: Secondary | ICD-10-CM | POA: Diagnosis not present

## 2021-05-06 ENCOUNTER — Other Ambulatory Visit: Payer: Self-pay | Admitting: Neurology

## 2021-05-06 DIAGNOSIS — S46011D Strain of muscle(s) and tendon(s) of the rotator cuff of right shoulder, subsequent encounter: Secondary | ICD-10-CM | POA: Diagnosis not present

## 2021-05-06 DIAGNOSIS — M6281 Muscle weakness (generalized): Secondary | ICD-10-CM | POA: Diagnosis not present

## 2021-05-06 DIAGNOSIS — M25611 Stiffness of right shoulder, not elsewhere classified: Secondary | ICD-10-CM | POA: Diagnosis not present

## 2021-05-06 MED ORDER — AMPHETAMINE-DEXTROAMPHETAMINE 15 MG PO TABS: ORAL_TABLET | ORAL | 0 refills | Status: DC

## 2021-05-06 NOTE — Telephone Encounter (Signed)
Request refill amphetamine-dextroamphetamine (ADDERALL) 15 MG tablet at [facility] [phone]

## 2021-05-11 DIAGNOSIS — M25611 Stiffness of right shoulder, not elsewhere classified: Secondary | ICD-10-CM | POA: Diagnosis not present

## 2021-05-11 DIAGNOSIS — S46011D Strain of muscle(s) and tendon(s) of the rotator cuff of right shoulder, subsequent encounter: Secondary | ICD-10-CM | POA: Diagnosis not present

## 2021-05-11 DIAGNOSIS — M6281 Muscle weakness (generalized): Secondary | ICD-10-CM | POA: Diagnosis not present

## 2021-05-13 DIAGNOSIS — S46011D Strain of muscle(s) and tendon(s) of the rotator cuff of right shoulder, subsequent encounter: Secondary | ICD-10-CM | POA: Diagnosis not present

## 2021-05-20 DIAGNOSIS — M6281 Muscle weakness (generalized): Secondary | ICD-10-CM | POA: Diagnosis not present

## 2021-05-20 DIAGNOSIS — M25611 Stiffness of right shoulder, not elsewhere classified: Secondary | ICD-10-CM | POA: Diagnosis not present

## 2021-05-20 DIAGNOSIS — S46011D Strain of muscle(s) and tendon(s) of the rotator cuff of right shoulder, subsequent encounter: Secondary | ICD-10-CM | POA: Diagnosis not present

## 2021-05-22 DIAGNOSIS — M25611 Stiffness of right shoulder, not elsewhere classified: Secondary | ICD-10-CM | POA: Diagnosis not present

## 2021-05-22 DIAGNOSIS — M6281 Muscle weakness (generalized): Secondary | ICD-10-CM | POA: Diagnosis not present

## 2021-05-22 DIAGNOSIS — S46011D Strain of muscle(s) and tendon(s) of the rotator cuff of right shoulder, subsequent encounter: Secondary | ICD-10-CM | POA: Diagnosis not present

## 2021-05-28 DIAGNOSIS — M25611 Stiffness of right shoulder, not elsewhere classified: Secondary | ICD-10-CM | POA: Diagnosis not present

## 2021-05-28 DIAGNOSIS — S46011D Strain of muscle(s) and tendon(s) of the rotator cuff of right shoulder, subsequent encounter: Secondary | ICD-10-CM | POA: Diagnosis not present

## 2021-05-28 DIAGNOSIS — M6281 Muscle weakness (generalized): Secondary | ICD-10-CM | POA: Diagnosis not present

## 2021-05-29 DIAGNOSIS — M6281 Muscle weakness (generalized): Secondary | ICD-10-CM | POA: Diagnosis not present

## 2021-05-29 DIAGNOSIS — M25611 Stiffness of right shoulder, not elsewhere classified: Secondary | ICD-10-CM | POA: Diagnosis not present

## 2021-05-29 DIAGNOSIS — S46011D Strain of muscle(s) and tendon(s) of the rotator cuff of right shoulder, subsequent encounter: Secondary | ICD-10-CM | POA: Diagnosis not present

## 2021-06-03 DIAGNOSIS — S46011D Strain of muscle(s) and tendon(s) of the rotator cuff of right shoulder, subsequent encounter: Secondary | ICD-10-CM | POA: Diagnosis not present

## 2021-06-03 DIAGNOSIS — M25611 Stiffness of right shoulder, not elsewhere classified: Secondary | ICD-10-CM | POA: Diagnosis not present

## 2021-06-03 DIAGNOSIS — M6281 Muscle weakness (generalized): Secondary | ICD-10-CM | POA: Diagnosis not present

## 2021-06-05 DIAGNOSIS — M6281 Muscle weakness (generalized): Secondary | ICD-10-CM | POA: Diagnosis not present

## 2021-06-05 DIAGNOSIS — S46011D Strain of muscle(s) and tendon(s) of the rotator cuff of right shoulder, subsequent encounter: Secondary | ICD-10-CM | POA: Diagnosis not present

## 2021-06-05 DIAGNOSIS — M25611 Stiffness of right shoulder, not elsewhere classified: Secondary | ICD-10-CM | POA: Diagnosis not present

## 2021-06-08 ENCOUNTER — Encounter: Payer: Self-pay | Admitting: Neurology

## 2021-06-08 ENCOUNTER — Ambulatory Visit: Payer: PPO | Admitting: Neurology

## 2021-06-08 ENCOUNTER — Other Ambulatory Visit: Payer: Self-pay

## 2021-06-08 VITALS — BP 172/89 | HR 95 | Ht 70.0 in | Wt 150.0 lb

## 2021-06-08 DIAGNOSIS — R208 Other disturbances of skin sensation: Secondary | ICD-10-CM

## 2021-06-08 DIAGNOSIS — S46011D Strain of muscle(s) and tendon(s) of the rotator cuff of right shoulder, subsequent encounter: Secondary | ICD-10-CM | POA: Diagnosis not present

## 2021-06-08 DIAGNOSIS — R200 Anesthesia of skin: Secondary | ICD-10-CM

## 2021-06-08 DIAGNOSIS — M25611 Stiffness of right shoulder, not elsewhere classified: Secondary | ICD-10-CM | POA: Diagnosis not present

## 2021-06-08 DIAGNOSIS — R202 Paresthesia of skin: Secondary | ICD-10-CM | POA: Insufficient documentation

## 2021-06-08 DIAGNOSIS — M6281 Muscle weakness (generalized): Secondary | ICD-10-CM | POA: Diagnosis not present

## 2021-06-08 DIAGNOSIS — R413 Other amnesia: Secondary | ICD-10-CM | POA: Diagnosis not present

## 2021-06-08 DIAGNOSIS — G373 Acute transverse myelitis in demyelinating disease of central nervous system: Secondary | ICD-10-CM | POA: Diagnosis not present

## 2021-06-08 MED ORDER — AMPHETAMINE-DEXTROAMPHETAMINE 15 MG PO TABS: ORAL_TABLET | ORAL | 0 refills | Status: DC

## 2021-06-08 NOTE — Progress Notes (Signed)
[facility]  PATIENT: [name] DOB: 04-30-1951  REFERRING CLINICIAN: Crist Infante HISTORY FROM: patient   REASON FOR VISIT: transverse myelitis, MS?   HISTORICAL  CHIEF COMPLAINT:  Chief Complaint  Patient presents with   Follow-up    RM 13, alone. Last seen 12/08/2020. Ambulates w/ cane. Had torn rotator cuff/bicep on right side. Had surgery in Feb 2022. Going through therapy right now.  She reports a lot of nerve pain from this. She has good/bad days for her memory. MOCA today: 26/30. 13 years of education.    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     Update 06/08/2021: She has weakness in hr legs from transverse myelitis and uses a cane.   She is walking about the same.  She fell out of a chair.   She has toruble getting up from a a chair.      She has bilateral leg weakness.   She feels arms are strong.   She needed biceps and shoulder surgery recently.  She had surgery by Dr. Sherwood Gambler ([place] and C6C7 fusion and removal of hardware at [facility])   She has pain around the shoulder and into the hand.   She has allodynia there as well.    She feels she is doing cognitively better.   She still notes a lot of fluctuations.   She is on amantadine and Adderall as she tolerates these well.   She had difficulty with memory, processing and word finding.        She is driving and is doing ok with that most days but sometimes will miss a turn    Her husband handles finances.       A PSG in the past showed no OSA.     She also feels mood and fatigue are better.      She is on lamotrigine 200 mg po qd (prescribed bid).   She thinks gabaoentin helped her in the past.    In April 2021 she had an MI but catheterization was fine.   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    EF = 45%.  She notes SOB with exertion.     Montreal Cognitive  Assessment  06/08/2021 12/08/2020 09/17/2019 03/14/2019 11/08/2016  Visuospatial/ Executive (0/5) 4 2 2 3 3   Naming (0/3) 3 3 3 3 3   Attention: Read list of digits (0/2) 2 1 0 2 1  Attention: Read list of letters (0/1) 1 1 1 1 1   Attention: Serial 7 subtraction starting at 100 (0/3) 1 1 1 1 3   Language: Repeat phrase (0/2) 1 0 0 0 1  Language : Fluency (0/1) 1 0 0 1 1  Abstraction (0/2) 2 2 0 1 2  Delayed Recall (0/5) 5 4 3 2 2   Orientation (0/6) 6 5 6 6 5   Total 26 19 16 20 22   Adjusted Score (based on education) 26 19 16 20 22      TM/MS History:  In 1989, she had severe numbness in one side of her body.  She had clumsiness and poor gait.  MRI was reportedly normal.     She saw Dr. Erling Cruz who did an LP that showed oligoclonal bands.   She received IV steroids and was told she had MS.   In 1993, she woke up numb from the waist down in  both legs.  This numbness was more intense than the prior episodes and gait was very poor.    She was admitted x 28 days, receiving many days of IV steroids followed by Rehab.     An MRI of the brain was normal but the MRI of the thoracic spine showed a plaque at T4.     She was started on Betaseron and did well in general.  She had some fluctuating symptom but nothing resembling any of the other 3 episodes.   In November 2014 she had additional imaging studies showing a normal MRI of the brain, an abnormal MRI of the thoracic spine with a focus at T4, and a normal cervical spinal cord. She did have evidence of prior fusion surgery in the neck.  MRI of the cervical spine in September 2016 showed stable postoperative ACDF at C4-C5. The spinal cord appeared normal.   There was no myelopathy in the cervical spine. She also underwent a lumbar puncture in November 2014. It was normal and did not show oligoclonal bands or increased IgG index.    She has been treated with Betaseron, Avonex, Tysabri and Gilenya for presumptive MS in the past.   She stopped Tysabri as was JCV Ab  positive and stopped Gilenya as she had macula edema.   She tried [place] but stopped due to insurance.  No DMT since 2014.      Imaging: MRI of the brain 09/15/2015 showed normal brain for age.  MRI of the cervical spine 09/25/2015 showed ACDF.  The spinal cord was normal.  My of the thoracic spine 02/19/2015 showed a single focus adjacent to T4 consistent with a chronic demyelinating plaque or chronic myelitis.  No change compared to 2014.  CT myelogram cervical spine 04/20/2019 showed a left central disc protrusion at C6-C7 effacing the ventral CSF.  There is no foraminal narrowing.  There is moderate foraminal narrowing bilaterally at C5-C6 but no spinal stenosis.  X-ray 05/17/2019 showed prior fusion at C3-C5 and fusion at C5-C7 associated with anterior fixation hardware.  REVIEW OF SYSTEMS:  Constitutional: No fevers, chills, sweats, or change in .  She reports fatigue Eyes: as above Ear, nose and throat: No hearing loss, ear pain, nasal congestion, sore throat Cardiovascular: No chest pain, palpitations Respiratory:  No shortness of breath at rest or with exertion.   No wheezes GastrointestinaI: No nausea, vomiting, diarrhea, abdominal pain.  Reports fecal incontinence.   Reports dysphagia Genitourinary:  see above Musculoskeletal:  No neck pain, back pain.   Hasmuscle cramps Integumentary: No rash, pruritus, skin lesions Neurological: as above Psychiatric: No depression at this time.  No anxiety Endocrine: No palpitations, diaphoresis, change in appetite, change in weigh or increased thirst Hematologic/Lymphatic:  No anemia, purpura, petechiae. Allergic/Immunologic: No itchy/runny eyes, nasal congestion, recent allergic reactions, rashes  ALLERGIES: Allergies  Allergen Reactions   Other Shortness Of Breath    PURPLE LETTUCE   Betaseron [Interferon Beta-1b] Other (See Comments)    HARD NODULAR AREAS AT INJECTION SITE   Codeine Nausea And Vomiting   Gilenya [Fingolimod  Hydrochloride] Other (See Comments)    MACULAR EDEMA   Morphine And Related Other (See Comments)    IV ROUTE - RED STREAKS OF VEIN   Sumatriptan Nausea Only    INJECTABLE ONLY - RAPID HEART RATE, FLUSHING   Tysabri [Natalizumab] Other (See Comments)    DEVELOPED JCV ANTIBODY   Latex Rash    HOME MEDICATIONS: Outpatient Medications Prior to Visit  Medication Sig  Dispense Refill   Amantadine HCl 100 MG tablet Take 1 tablet (100 mg total) by mouth 3 (three) times daily. 270 tablet 1   Biotin 5000 MCG CAPS Take 1 capsule by mouth daily.     calcium carbonate (OS-CAL) 600 MG TABS tablet Take 1,200 mg by mouth.      Dalfampridine (4-AMINOPYRIDINE) POWD Take 5 mg by mouth 3 (three) times daily. Pharmacy dispense 270 capsules with 3 refills. Thank you 270 g 3   denosumab (PROLIA) 60 MG/ML SOLN injection Inject 60 mg into the skin every 6 (six) months. Administer in upper arm, thigh, or abdomen     Dexlansoprazole (DEXILANT) 30 MG capsule Take 30 mg by mouth daily.     donepezil (ARICEPT) 10 MG tablet TAKE 1 TABLET BY MOUTH EVERYDAY AT BEDTIME 90 tablet 4   lamoTRIgine (LAMICTAL) 200 MG tablet Take 1 tablet (200 mg total) by mouth 2 (two) times daily. (Patient taking differently: Take 200 mg by mouth daily.) 180 tablet 3   loratadine (CLARITIN) 10 MG tablet Take 10 mg by mouth daily.     Multiple Vitamins-Minerals (MULTIVITAMIN PO) Take 1 tablet by mouth daily. Brand name - Mature Multivitamin from [employer]     Probiotic Product ([place] PO) Take 1 tablet by mouth every morning.     rOPINIRole (REQUIP) 0.25 MG tablet TAKE 1 TO 2 TABLETS BY MOUTH AT NIGHT 180 tablet 2   rosuvastatin (CRESTOR) 5 MG tablet Take 5 mg by mouth at bedtime.     VITAMIN D PO Take 2,000 Units by mouth daily.     amphetamine-dextroamphetamine (ADDERALL) 15 MG tablet Take one po qAm and one po 4 hours later 60 tablet 0   Biotin 10000 MCG TABS Take 1 tablet by mouth daily.     Meclizine HCl 25 MG CHEW Chew 2  tablets by mouth as needed (dizziness).      ondansetron (ZOFRAN-ODT) 4 MG disintegrating tablet Take 4 mg by mouth every 6 (six) hours as needed.     Vitamin D, Ergocalciferol, (DRISDOL) 50000 UNITS CAPS capsule Take 1 capsule (50,000 Units total) by mouth every 7 (seven) days. When finished, take an otc daily vitamin d supplement of 5,000iu. (Patient taking differently: Take 50,000 Units by mouth every Sunday.) 12 capsule 0   No facility-administered medications prior to visit.    PAST MEDICAL HISTORY: Past Medical History:  Diagnosis Date   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   Arthritis    back   Asthma    20 yrs., ago one episode   Chronic migraine headaches 03/13/2008   Chronic rhinitis 03/13/2008   Deviated septum 01/23/2020   Dysesthesia    bilateral feet   Dysphagia, oropharyngeal phase 10/21/2009   Dyspnea    Gastroesophageal reflux disease 09/10/2008   Heart murmur    first noticed 41 yrs. 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 , low grade appendiceal mucinous neoplasm    HNP (herniated nucleus pulposus), cervical 05/17/2019   Hypokalemia 12/04/2019   Mitral valve prolapse    states no problem   Multiple sclerosis 1989   Myocarditis 04/25/2019   Neurogenic bladder    Obstructive chronic bronchitis with exacerbation 09/10/2008   Qualifier: Diagnosis of  By: Joya Gaskins MD, Burnett Harry    Occipital neuralgia 05/06/2015  Osteoporosis    Pneumonia    PONV (postoperative nausea and vomiting)    Pulmonary nodule, right upper lobe 01/22/2009   Short of breath on exertion    Sjogren's disease    dryness of eyes, mouth   Spastic gait    Transverse myelitis    T4   Urinary frequency 02/05/2015   Vitamin D deficiency 12/09/2016   Vocal cord disorder 10/21/2009   Wears bilateral ankle braces    AFO braces for stability    PAST SURGICAL  HISTORY: Past Surgical History:  Procedure Laterality Date   ANAL RECTAL MANOMETRY N/A 08/18/2016   Procedure: ANO RECTAL MANOMETRY;  Surgeon: Leighton Ruff, MD;  Location: [facility];  Service: Endoscopy;  Laterality: N/A;   ANTERIOR CERVICAL DECOMP/DISCECTOMY FUSION  2004;   2003   C4 -- C5 (2004)/   C3 -- C4 (2003)   ANTERIOR CERVICAL DECOMP/DISCECTOMY FUSION N/A 05/17/2019   Procedure: EVACUATION HEMATOMA OF POST OP ANTERIOR CERVICAL DECOMPRESSION FUSION;  Surgeon: Jovita Gamma, MD;  Location: [place];  Service: Neurosurgery;  Laterality: N/A;   ANTERIOR CERVICAL DECOMP/DISCECTOMY FUSION N/A 05/17/2019   Procedure: REMOVAL OF TETHER CERVICAL PLATE, ANTERIOR CERVICAL DECOMPRESSION/DISCECTOMY FUSION CERVICAL FIVE- CERVICAL SIX, CERVICAL SIX- CERVICAL SEVEN;  Surgeon: Jovita Gamma, MD;  Location: [place];  Service: Neurosurgery;  Laterality: N/A;   APPENDECTOMY  06/22/2011   laparoscopic   BILATERAL SALPINGOOPHORECTOMY  1982   BLADDER SUSPENSION  1994   sling   BREAST ENHANCEMENT SURGERY Bilateral    BREAST IMPLANT REMOVAL Bilateral 12/03/2008   BUNIONECTOMY     CATARACT EXTRACTION W/ INTRAOCULAR LENS  IMPLANT, BILATERAL  2015   ELBOW SURGERY Right    HEMORROIDECTOMY  08/05/2010;  2013   INSERTION SACRAL NERVE STIMULATOR TEST WIRE  09-28-2016   dr Marcello Moores   INTERCOSTAL NERVE BLOCK  2015   occipital   MENISCUS REPAIR Left 09/26/2018   RECTAL ULTRASOUND N/A 08/18/2016   Procedure: RECTAL ULTRASOUND;  Surgeon: Leighton Ruff, MD;  Location: [facility];  Service: Endoscopy;  Laterality: N/A;   SHOULDER ARTHROSCOPY Left    SHOULDER ARTHROSCOPY WITH DISTAL CLAVICLE RESECTION Right 09/21/2007   w/  Acrominoplasty,  Debridement Rotator Cuff,  CA ligament release   TONSILLECTOMY  age 44   TRIGGER FINGER RELEASE Left 01/16/2015   Procedure: LEFT THUMB TRIGGER RELEASE ;  Surgeon: Leanora Cover, MD;  Location: [place];  Service: Orthopedics;  Laterality: Left;   VAGINAL  HYSTERECTOMY  1981    FAMILY HISTORY: Family History  Problem Relation Age of Onset   Liver disease Father    Cirrhosis Father    Stroke Mother    Cancer Mother        oropharyngeal   Heart attack Mother    Anesthesia problems Mother        post-op nausea   Cancer Brother        twin; tonsillar?   Cancer Sister 49       breast ca, lung ca   Cancer Other        Nephew; appendiceal carcnoid, melanoma   Cancer Paternal Grandmother        cervical    SOCIAL HISTORY:  Social History   Socioeconomic History   Marital status: Married    Spouse name: Tom   Number of children: 2   Years of education: 13   Highest education level: Some college, no degree  Occupational History   Occupation: Retired  Tobacco Use   Smoking status: Passive  Smoke Exposure - Never Smoker   Smokeless tobacco: Never   Tobacco comments:    parents, husband, & multiple family members  Substance and Sexual Activity   Alcohol use: No    Alcohol/week: 0.0 standard drinks   Drug use: No   Sexual activity: Not on file  Other Topics Concern   Not on file  Social History Narrative   Pt lives at home with her spouse of 108 years.   Caffeine Use- Drinks caffeine occasionally.      [facility]:   From [place]. Previously did administrative work. She currently has a cat & dog. No bird exposure. No mold or hot tub exposure. No carpet or draperies.       Social Determinants of Health   Financial Resource Strain: Not on file  Food Insecurity: Not on file  Transportation Needs: Not on file  Physical Activity: Not on file  Stress: Not on file  Social Connections: Not on file  Intimate Partner Violence: Not on file     PHYSICAL EXAM  Vitals:   06/08/21 1126  BP: (!) 172/89  Pulse: 95  Weight: 150 lb (68 kg)  Height: 5\' 10"  (1.778 m)    Body mass index is 21.52 kg/m.   General: The patient is well-developed and well-nourished and in no acute distress  Neck:    She has tenderness in the  occiput, right greater than left..  Range of motion is slightly reduced in the neck..    Neurologic Exam  Mental status: The patient is alert and oriented at the time of the examination.  She scored 16/30 on the [facility] cognitive assessment (details are above).  Short-term memory was 3/5 without problems, still 3/5 with category prompts .  Reduced focus and attention.  Reduced visual-spatial tasks.Marland Kitchen  Speech was normal..    Cranial nerves: Extraocular movements are full.  Facial strength is normal.. No dysarthria is noted.    Motor:  Very mild essential tremor noted with writing.   Muscle bulk and tone are normal.  Strength was 4/5 in the right triceps, 4-/5 ulnar intrinsic, 4/5 APB, 4++ in right biceps.  Strength was 5/5 in the left arm.  Rapid altering movements were performed slower on the right than the left.   Leg strength was 4 to 4+/5 proximally and 4-/5 distally    Sensory: There is reduced sensation to touch below the mid thoracic level.  She has reduced vibration sensation in both legs symmetrically.   She has reduced sensation in the C8 dermatome of the arm (also reduced vibration of 5th digit more c/w C8 than ulnar)  Coordination: Cerebellar testing shows good finger-nose-finger but reduced heel-to-shin bilaterally..  Gait and station: Station is stable eyes open.  Her gait is spastic with bilateral foot drops, left greater than right.   She can walk without unilateral assistance though was fairly ataxic.Marland Kitchen She cannot do a tandem walk.  Romberg is positive.  Reflexes: Deep tendon reflexes are symmetric and normal in the arms but increased at the legs with crossed abductors at the knees.  No ankle clonus       ASSESSMENT AND PLAN  Transverse myelitis (HCC)  Memory loss  Numbness and tingling of right arm - Plan: NCV with EMG(electromyography)  Dysesthesia - Plan: NCV with EMG(electromyography)   1.   She has transverse myelitis (T4).  She does not have additional lesions  and MS is less likely. 2.   Continue Adderall and amantadine for ADD, fatigue.  Continue  ropinirole for restless legs  3.   Use cane or walker for safety.   4.   MoCA score today (26) was normal and she did better than multiple other tests over last 4+ years.    Imaging shows no atrophy.   Likely mood and sleep play a role in cognitive difficulties.   5.   RTC 6 months, sooner if problems or based on the results.  40-minute office visit with the majority of the time spent face-to-face for history and physical, performing [place] cognitive assessment, discussion/counseling and decision-making.  Additional time with record review and documentation.   Kaseem Vastine A. Felecia Shelling, MD, PhD 1/84/8592, 76:39 PM Certified in Neurology, Clinical Neurophysiology, Sleep Medicine, Pain Medicine and Neuroimaging  [facility] [address] [place], [place] [address] [phone]

## 2021-06-09 DIAGNOSIS — M4722 Other spondylosis with radiculopathy, cervical region: Secondary | ICD-10-CM | POA: Diagnosis not present

## 2021-06-09 DIAGNOSIS — M5412 Radiculopathy, cervical region: Secondary | ICD-10-CM | POA: Diagnosis not present

## 2021-06-09 DIAGNOSIS — G5621 Lesion of ulnar nerve, right upper limb: Secondary | ICD-10-CM | POA: Diagnosis not present

## 2021-06-09 DIAGNOSIS — S129XXD Fracture of neck, unspecified, subsequent encounter: Secondary | ICD-10-CM | POA: Diagnosis not present

## 2021-06-11 ENCOUNTER — Other Ambulatory Visit: Payer: Self-pay | Admitting: Student

## 2021-06-11 DIAGNOSIS — Z85828 Personal history of other malignant neoplasm of skin: Secondary | ICD-10-CM | POA: Diagnosis not present

## 2021-06-11 DIAGNOSIS — M5412 Radiculopathy, cervical region: Secondary | ICD-10-CM

## 2021-06-11 DIAGNOSIS — L309 Dermatitis, unspecified: Secondary | ICD-10-CM | POA: Diagnosis not present

## 2021-06-15 DIAGNOSIS — M25611 Stiffness of right shoulder, not elsewhere classified: Secondary | ICD-10-CM | POA: Diagnosis not present

## 2021-06-15 DIAGNOSIS — M6281 Muscle weakness (generalized): Secondary | ICD-10-CM | POA: Diagnosis not present

## 2021-06-15 DIAGNOSIS — M25561 Pain in right knee: Secondary | ICD-10-CM | POA: Diagnosis not present

## 2021-06-18 ENCOUNTER — Other Ambulatory Visit: Payer: Self-pay

## 2021-06-18 ENCOUNTER — Ambulatory Visit
Admission: RE | Admit: 2021-06-18 | Discharge: 2021-06-18 | Disposition: A | Discharge status: Home / Self Care | Payer: PPO | Source: Ambulatory Visit | Attending: Student | Admitting: Student

## 2021-06-18 DIAGNOSIS — S46011D Strain of muscle(s) and tendon(s) of the rotator cuff of right shoulder, subsequent encounter: Secondary | ICD-10-CM | POA: Diagnosis not present

## 2021-06-18 DIAGNOSIS — M5021 Other cervical disc displacement,  high cervical region: Secondary | ICD-10-CM | POA: Diagnosis not present

## 2021-06-18 DIAGNOSIS — R200 Anesthesia of skin: Secondary | ICD-10-CM | POA: Diagnosis not present

## 2021-06-18 DIAGNOSIS — M25611 Stiffness of right shoulder, not elsewhere classified: Secondary | ICD-10-CM | POA: Diagnosis not present

## 2021-06-18 DIAGNOSIS — M6281 Muscle weakness (generalized): Secondary | ICD-10-CM | POA: Diagnosis not present

## 2021-06-18 DIAGNOSIS — M5412 Radiculopathy, cervical region: Secondary | ICD-10-CM

## 2021-06-19 DIAGNOSIS — R600 Localized edema: Secondary | ICD-10-CM | POA: Diagnosis not present

## 2021-06-19 DIAGNOSIS — I10 Essential (primary) hypertension: Secondary | ICD-10-CM | POA: Diagnosis not present

## 2021-06-24 DIAGNOSIS — M6281 Muscle weakness (generalized): Secondary | ICD-10-CM | POA: Diagnosis not present

## 2021-06-24 DIAGNOSIS — M25611 Stiffness of right shoulder, not elsewhere classified: Secondary | ICD-10-CM | POA: Diagnosis not present

## 2021-06-24 DIAGNOSIS — S46011D Strain of muscle(s) and tendon(s) of the rotator cuff of right shoulder, subsequent encounter: Secondary | ICD-10-CM | POA: Diagnosis not present

## 2021-07-01 DIAGNOSIS — N952 Postmenopausal atrophic vaginitis: Secondary | ICD-10-CM | POA: Diagnosis not present

## 2021-07-02 DIAGNOSIS — M6281 Muscle weakness (generalized): Secondary | ICD-10-CM | POA: Diagnosis not present

## 2021-07-02 DIAGNOSIS — S46011D Strain of muscle(s) and tendon(s) of the rotator cuff of right shoulder, subsequent encounter: Secondary | ICD-10-CM | POA: Diagnosis not present

## 2021-07-02 DIAGNOSIS — M25611 Stiffness of right shoulder, not elsewhere classified: Secondary | ICD-10-CM | POA: Diagnosis not present

## 2021-07-06 DIAGNOSIS — S46011D Strain of muscle(s) and tendon(s) of the rotator cuff of right shoulder, subsequent encounter: Secondary | ICD-10-CM | POA: Diagnosis not present

## 2021-07-06 DIAGNOSIS — M25611 Stiffness of right shoulder, not elsewhere classified: Secondary | ICD-10-CM | POA: Diagnosis not present

## 2021-07-06 DIAGNOSIS — M6281 Muscle weakness (generalized): Secondary | ICD-10-CM | POA: Diagnosis not present

## 2021-07-07 DIAGNOSIS — G5601 Carpal tunnel syndrome, right upper limb: Secondary | ICD-10-CM | POA: Diagnosis not present

## 2021-07-07 DIAGNOSIS — G5621 Lesion of ulnar nerve, right upper limb: Secondary | ICD-10-CM | POA: Diagnosis not present

## 2021-07-07 DIAGNOSIS — R200 Anesthesia of skin: Secondary | ICD-10-CM | POA: Diagnosis not present

## 2021-07-09 ENCOUNTER — Telehealth: Payer: Self-pay | Admitting: *Deleted

## 2021-07-09 DIAGNOSIS — M6281 Muscle weakness (generalized): Secondary | ICD-10-CM | POA: Diagnosis not present

## 2021-07-09 DIAGNOSIS — S46011D Strain of muscle(s) and tendon(s) of the rotator cuff of right shoulder, subsequent encounter: Secondary | ICD-10-CM | POA: Diagnosis not present

## 2021-07-09 DIAGNOSIS — M25611 Stiffness of right shoulder, not elsewhere classified: Secondary | ICD-10-CM | POA: Diagnosis not present

## 2021-07-09 NOTE — Telephone Encounter (Signed)
error 

## 2021-07-20 DIAGNOSIS — S46011D Strain of muscle(s) and tendon(s) of the rotator cuff of right shoulder, subsequent encounter: Secondary | ICD-10-CM | POA: Diagnosis not present

## 2021-07-20 DIAGNOSIS — M25611 Stiffness of right shoulder, not elsewhere classified: Secondary | ICD-10-CM | POA: Diagnosis not present

## 2021-07-20 DIAGNOSIS — M6281 Muscle weakness (generalized): Secondary | ICD-10-CM | POA: Diagnosis not present

## 2021-07-22 ENCOUNTER — Encounter: Payer: PPO | Admitting: Neurology

## 2021-07-23 DIAGNOSIS — S46011D Strain of muscle(s) and tendon(s) of the rotator cuff of right shoulder, subsequent encounter: Secondary | ICD-10-CM | POA: Diagnosis not present

## 2021-07-23 DIAGNOSIS — M6281 Muscle weakness (generalized): Secondary | ICD-10-CM | POA: Diagnosis not present

## 2021-07-23 DIAGNOSIS — M25611 Stiffness of right shoulder, not elsewhere classified: Secondary | ICD-10-CM | POA: Diagnosis not present

## 2021-07-27 DIAGNOSIS — M25611 Stiffness of right shoulder, not elsewhere classified: Secondary | ICD-10-CM | POA: Diagnosis not present

## 2021-07-27 DIAGNOSIS — S46011D Strain of muscle(s) and tendon(s) of the rotator cuff of right shoulder, subsequent encounter: Secondary | ICD-10-CM | POA: Diagnosis not present

## 2021-07-27 DIAGNOSIS — M6281 Muscle weakness (generalized): Secondary | ICD-10-CM | POA: Diagnosis not present

## 2021-07-28 DIAGNOSIS — Z85828 Personal history of other malignant neoplasm of skin: Secondary | ICD-10-CM | POA: Diagnosis not present

## 2021-07-28 DIAGNOSIS — D1801 Hemangioma of skin and subcutaneous tissue: Secondary | ICD-10-CM | POA: Diagnosis not present

## 2021-07-28 DIAGNOSIS — L570 Actinic keratosis: Secondary | ICD-10-CM | POA: Diagnosis not present

## 2021-07-28 DIAGNOSIS — I872 Venous insufficiency (chronic) (peripheral): Secondary | ICD-10-CM | POA: Diagnosis not present

## 2021-07-28 DIAGNOSIS — I8311 Varicose veins of right lower extremity with inflammation: Secondary | ICD-10-CM | POA: Diagnosis not present

## 2021-07-28 DIAGNOSIS — L820 Inflamed seborrheic keratosis: Secondary | ICD-10-CM | POA: Diagnosis not present

## 2021-07-28 DIAGNOSIS — L821 Other seborrheic keratosis: Secondary | ICD-10-CM | POA: Diagnosis not present

## 2021-07-28 DIAGNOSIS — L718 Other rosacea: Secondary | ICD-10-CM | POA: Diagnosis not present

## 2021-07-28 DIAGNOSIS — I8312 Varicose veins of left lower extremity with inflammation: Secondary | ICD-10-CM | POA: Diagnosis not present

## 2021-07-30 DIAGNOSIS — S46011D Strain of muscle(s) and tendon(s) of the rotator cuff of right shoulder, subsequent encounter: Secondary | ICD-10-CM | POA: Diagnosis not present

## 2021-07-30 DIAGNOSIS — M25611 Stiffness of right shoulder, not elsewhere classified: Secondary | ICD-10-CM | POA: Diagnosis not present

## 2021-07-30 DIAGNOSIS — M6281 Muscle weakness (generalized): Secondary | ICD-10-CM | POA: Diagnosis not present

## 2021-08-03 DIAGNOSIS — S46011D Strain of muscle(s) and tendon(s) of the rotator cuff of right shoulder, subsequent encounter: Secondary | ICD-10-CM | POA: Diagnosis not present

## 2021-08-03 DIAGNOSIS — M6281 Muscle weakness (generalized): Secondary | ICD-10-CM | POA: Diagnosis not present

## 2021-08-03 DIAGNOSIS — M25611 Stiffness of right shoulder, not elsewhere classified: Secondary | ICD-10-CM | POA: Diagnosis not present

## 2021-08-04 DIAGNOSIS — S129XXD Fracture of neck, unspecified, subsequent encounter: Secondary | ICD-10-CM | POA: Diagnosis not present

## 2021-08-04 DIAGNOSIS — M542 Cervicalgia: Secondary | ICD-10-CM | POA: Diagnosis not present

## 2021-08-04 DIAGNOSIS — G5601 Carpal tunnel syndrome, right upper limb: Secondary | ICD-10-CM | POA: Diagnosis not present

## 2021-08-06 DIAGNOSIS — M25611 Stiffness of right shoulder, not elsewhere classified: Secondary | ICD-10-CM | POA: Diagnosis not present

## 2021-08-06 DIAGNOSIS — S46011D Strain of muscle(s) and tendon(s) of the rotator cuff of right shoulder, subsequent encounter: Secondary | ICD-10-CM | POA: Diagnosis not present

## 2021-08-06 DIAGNOSIS — M6281 Muscle weakness (generalized): Secondary | ICD-10-CM | POA: Diagnosis not present

## 2021-08-10 DIAGNOSIS — S46011D Strain of muscle(s) and tendon(s) of the rotator cuff of right shoulder, subsequent encounter: Secondary | ICD-10-CM | POA: Diagnosis not present

## 2021-08-10 DIAGNOSIS — M25611 Stiffness of right shoulder, not elsewhere classified: Secondary | ICD-10-CM | POA: Diagnosis not present

## 2021-08-10 DIAGNOSIS — M6281 Muscle weakness (generalized): Secondary | ICD-10-CM | POA: Diagnosis not present

## 2021-08-13 DIAGNOSIS — S46011D Strain of muscle(s) and tendon(s) of the rotator cuff of right shoulder, subsequent encounter: Secondary | ICD-10-CM | POA: Diagnosis not present

## 2021-08-13 DIAGNOSIS — M25611 Stiffness of right shoulder, not elsewhere classified: Secondary | ICD-10-CM | POA: Diagnosis not present

## 2021-08-13 DIAGNOSIS — M6281 Muscle weakness (generalized): Secondary | ICD-10-CM | POA: Diagnosis not present

## 2021-08-15 ENCOUNTER — Other Ambulatory Visit: Payer: Self-pay | Admitting: Neurology

## 2021-08-17 DIAGNOSIS — M25611 Stiffness of right shoulder, not elsewhere classified: Secondary | ICD-10-CM | POA: Diagnosis not present

## 2021-08-17 DIAGNOSIS — S46011D Strain of muscle(s) and tendon(s) of the rotator cuff of right shoulder, subsequent encounter: Secondary | ICD-10-CM | POA: Diagnosis not present

## 2021-08-17 DIAGNOSIS — M6281 Muscle weakness (generalized): Secondary | ICD-10-CM | POA: Diagnosis not present

## 2021-08-20 DIAGNOSIS — S46011D Strain of muscle(s) and tendon(s) of the rotator cuff of right shoulder, subsequent encounter: Secondary | ICD-10-CM | POA: Diagnosis not present

## 2021-08-20 DIAGNOSIS — M25611 Stiffness of right shoulder, not elsewhere classified: Secondary | ICD-10-CM | POA: Diagnosis not present

## 2021-08-20 DIAGNOSIS — M6281 Muscle weakness (generalized): Secondary | ICD-10-CM | POA: Diagnosis not present

## 2021-08-27 ENCOUNTER — Other Ambulatory Visit: Payer: Self-pay | Admitting: Neurology

## 2021-09-28 ENCOUNTER — Other Ambulatory Visit: Payer: Self-pay | Admitting: Neurology

## 2021-10-02 DIAGNOSIS — E559 Vitamin D deficiency, unspecified: Secondary | ICD-10-CM | POA: Diagnosis not present

## 2021-10-02 DIAGNOSIS — E785 Hyperlipidemia, unspecified: Secondary | ICD-10-CM | POA: Diagnosis not present

## 2021-10-09 DIAGNOSIS — I700 Atherosclerosis of aorta: Secondary | ICD-10-CM | POA: Diagnosis not present

## 2021-10-09 DIAGNOSIS — E785 Hyperlipidemia, unspecified: Secondary | ICD-10-CM | POA: Diagnosis not present

## 2021-10-09 DIAGNOSIS — Z0000 Encounter for general adult medical examination without abnormal findings: Secondary | ICD-10-CM | POA: Diagnosis not present

## 2021-10-09 DIAGNOSIS — J45998 Other asthma: Secondary | ICD-10-CM | POA: Diagnosis not present

## 2021-10-09 DIAGNOSIS — I10 Essential (primary) hypertension: Secondary | ICD-10-CM | POA: Diagnosis not present

## 2021-10-09 DIAGNOSIS — E559 Vitamin D deficiency, unspecified: Secondary | ICD-10-CM | POA: Diagnosis not present

## 2021-10-09 DIAGNOSIS — G629 Polyneuropathy, unspecified: Secondary | ICD-10-CM | POA: Diagnosis not present

## 2021-10-09 DIAGNOSIS — Z1339 Encounter for screening examination for other mental health and behavioral disorders: Secondary | ICD-10-CM | POA: Diagnosis not present

## 2021-10-09 DIAGNOSIS — R82998 Other abnormal findings in urine: Secondary | ICD-10-CM | POA: Diagnosis not present

## 2021-10-09 DIAGNOSIS — G373 Acute transverse myelitis in demyelinating disease of central nervous system: Secondary | ICD-10-CM | POA: Diagnosis not present

## 2021-10-09 DIAGNOSIS — Z1212 Encounter for screening for malignant neoplasm of rectum: Secondary | ICD-10-CM | POA: Diagnosis not present

## 2021-10-09 DIAGNOSIS — Z23 Encounter for immunization: Secondary | ICD-10-CM | POA: Diagnosis not present

## 2021-10-09 DIAGNOSIS — N1831 Chronic kidney disease, stage 3a: Secondary | ICD-10-CM | POA: Diagnosis not present

## 2021-10-09 DIAGNOSIS — G35 Multiple sclerosis: Secondary | ICD-10-CM | POA: Diagnosis not present

## 2021-10-09 DIAGNOSIS — I213 ST elevation (STEMI) myocardial infarction of unspecified site: Secondary | ICD-10-CM | POA: Diagnosis not present

## 2021-10-09 DIAGNOSIS — M3500 Sicca syndrome, unspecified: Secondary | ICD-10-CM | POA: Diagnosis not present

## 2021-10-09 DIAGNOSIS — Z1331 Encounter for screening for depression: Secondary | ICD-10-CM | POA: Diagnosis not present

## 2021-10-09 DIAGNOSIS — I2510 Atherosclerotic heart disease of native coronary artery without angina pectoris: Secondary | ICD-10-CM | POA: Diagnosis not present

## 2021-10-13 DIAGNOSIS — G609 Hereditary and idiopathic neuropathy, unspecified: Secondary | ICD-10-CM | POA: Diagnosis not present

## 2021-10-21 ENCOUNTER — Other Ambulatory Visit (HOSPITAL_COMMUNITY): Payer: Self-pay | Admitting: *Deleted

## 2021-10-22 ENCOUNTER — Encounter (HOSPITAL_COMMUNITY): Payer: PPO

## 2021-10-23 ENCOUNTER — Ambulatory Visit: Payer: PPO | Admitting: Cardiology

## 2021-10-23 DIAGNOSIS — R52 Pain, unspecified: Secondary | ICD-10-CM | POA: Diagnosis not present

## 2021-10-23 DIAGNOSIS — J069 Acute upper respiratory infection, unspecified: Secondary | ICD-10-CM | POA: Diagnosis not present

## 2021-10-23 DIAGNOSIS — J302 Other seasonal allergic rhinitis: Secondary | ICD-10-CM | POA: Diagnosis not present

## 2021-10-23 DIAGNOSIS — R0602 Shortness of breath: Secondary | ICD-10-CM | POA: Diagnosis not present

## 2021-10-23 DIAGNOSIS — R051 Acute cough: Secondary | ICD-10-CM | POA: Diagnosis not present

## 2021-10-23 DIAGNOSIS — Z1152 Encounter for screening for COVID-19: Secondary | ICD-10-CM | POA: Diagnosis not present

## 2021-10-23 DIAGNOSIS — J45998 Other asthma: Secondary | ICD-10-CM | POA: Diagnosis not present

## 2021-10-23 DIAGNOSIS — I10 Essential (primary) hypertension: Secondary | ICD-10-CM | POA: Diagnosis not present

## 2021-11-13 DIAGNOSIS — N9089 Other specified noninflammatory disorders of vulva and perineum: Secondary | ICD-10-CM | POA: Diagnosis not present

## 2021-11-15 ENCOUNTER — Other Ambulatory Visit: Payer: Self-pay | Admitting: Neurology

## 2021-11-25 ENCOUNTER — Other Ambulatory Visit: Payer: Self-pay

## 2021-11-25 ENCOUNTER — Ambulatory Visit: Payer: PPO | Admitting: Cardiology

## 2021-11-25 ENCOUNTER — Encounter: Payer: Self-pay | Admitting: Cardiology

## 2021-11-25 VITALS — BP 120/75 | HR 78 | Temp 98.3°F | Resp 16 | Ht 70.0 in | Wt 151.8 lb

## 2021-11-25 DIAGNOSIS — R0609 Other forms of dyspnea: Secondary | ICD-10-CM | POA: Diagnosis not present

## 2021-11-25 DIAGNOSIS — R5383 Other fatigue: Secondary | ICD-10-CM | POA: Diagnosis not present

## 2021-11-25 DIAGNOSIS — R5381 Other malaise: Secondary | ICD-10-CM

## 2021-11-25 NOTE — Progress Notes (Signed)
Primary Physician/Referring:  Crist Infante, MD  Patient ID: [name], female    DOB: 1951-06-04, 70 y.o.   MRN: [id number]  Chief Complaint  Patient presents with   Palpitations   DOE   New Patient (Initial Visit)    Referred by Crist Infante, MD   HPI:    [name]  is a 70 y.o. Caucasian female patient with multiple sclerosis, has had history of transverse myelitis, residual bilateral leg weakness left worse than the right, last cardiac admission with "STEMI" in April 2020 revealing normal coronary arteries and mid anterior hypokinesis of the left ventricle, suggestive of either Takotsubo or myopericarditis.  Repeat echocardiogram had complete normalization of LVEF in 2020.  Symptoms started several months ago.  No other associated symptoms or chest pain or palpitations.  She also complains of marked fatigue and daytime somnolence.  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 , low grade appendiceal mucinous neoplasm    HNP (herniated nucleus pulposus), cervical 05/17/2019   Hypokalemia 12/04/2019   Mitral valve prolapse    states no problem   Multiple sclerosis 1989   Myocarditis 04/25/2019   Neurogenic bladder    Obstructive chronic bronchitis with exacerbation 09/10/2008   Qualifier: Diagnosis of  By: Joya Gaskins MD, Burnett Harry    Occipital neuralgia 05/06/2015   Osteoporosis    Pneumonia    PONV (postoperative nausea and vomiting)     Pulmonary nodule, right upper lobe 01/22/2009   Short of breath on exertion    Sjogren's disease    dryness of eyes, mouth   Spastic gait    Transverse myelitis    T4   Urinary frequency 02/05/2015   Vitamin D deficiency 12/09/2016   Vocal cord disorder 10/21/2009   Wears bilateral ankle braces    AFO braces for stability   Past Surgical History:  Procedure Laterality Date   ANAL RECTAL MANOMETRY N/A 08/18/2016   Procedure: ANO RECTAL MANOMETRY;  Surgeon: Leighton Ruff, MD;  Location: [facility];  Service: Endoscopy;  Laterality: N/A;   ANTERIOR CERVICAL DECOMP/DISCECTOMY FUSION  2004;   2003   C4 -- C5 (2004)/   C3 -- C4 (2003)   ANTERIOR CERVICAL DECOMP/DISCECTOMY FUSION N/A 05/17/2019   Procedure: EVACUATION HEMATOMA OF POST OP ANTERIOR CERVICAL DECOMPRESSION FUSION;  Surgeon: Jovita Gamma, MD;  Location: [place];  Service: Neurosurgery;  Laterality: N/A;   ANTERIOR CERVICAL DECOMP/DISCECTOMY FUSION N/A 05/17/2019   Procedure: REMOVAL OF TETHER CERVICAL PLATE, ANTERIOR CERVICAL DECOMPRESSION/DISCECTOMY FUSION CERVICAL FIVE- CERVICAL SIX, CERVICAL SIX- CERVICAL SEVEN;  Surgeon: Jovita Gamma, MD;  Location: [place];  Service: Neurosurgery;  Laterality: N/A;   APPENDECTOMY  06/22/2011   laparoscopic   BILATERAL SALPINGOOPHORECTOMY  1982   BLADDER SUSPENSION  1994   sling   BREAST ENHANCEMENT SURGERY Bilateral    BREAST  IMPLANT REMOVAL Bilateral 12/03/2008   BUNIONECTOMY     CATARACT EXTRACTION W/ INTRAOCULAR LENS  IMPLANT, BILATERAL  2015   ELBOW SURGERY Right    HEMORROIDECTOMY  08/05/2010;  2013   INSERTION SACRAL NERVE STIMULATOR TEST WIRE  09-28-2016   dr Marcello Moores   INTERCOSTAL NERVE BLOCK  2015   occipital   MENISCUS REPAIR Left 09/26/2018   RECTAL ULTRASOUND N/A 08/18/2016   Procedure: RECTAL ULTRASOUND;  Surgeon: Leighton Ruff, MD;  Location: [facility];  Service: Endoscopy;  Laterality: N/A;   SHOULDER ARTHROSCOPY Left    SHOULDER ARTHROSCOPY WITH DISTAL CLAVICLE  RESECTION Right 09/21/2007   w/  Acrominoplasty,  Debridement Rotator Cuff,  CA ligament release   TONSILLECTOMY  age 61   TRIGGER FINGER RELEASE Left 01/16/2015   Procedure: LEFT THUMB TRIGGER RELEASE ;  Surgeon: Leanora Cover, MD;  Location: [place];  Service: Orthopedics;  Laterality: Left;   VAGINAL HYSTERECTOMY  1981   Family History  Problem Relation Age of Onset   Liver disease Father    Cirrhosis Father    Stroke Mother    Cancer Mother        oropharyngeal   Heart attack Mother    Anesthesia problems Mother        post-op nausea   Cancer Brother        twin; tonsillar?   Cancer Sister 87       breast ca, lung ca   Cancer Other        Nephew; appendiceal carcnoid, melanoma   Cancer Paternal Grandmother        cervical    Social History   Tobacco Use   Smoking status: Never    Passive exposure: Yes   Smokeless tobacco: Never   Tobacco comments:    parents, husband, & multiple family members  Substance Use Topics   Alcohol use: No    Alcohol/week: 0.0 standard drinks   Marital Status: Married  ROS  Review of Systems  Constitutional: Positive for malaise/fatigue.  Cardiovascular:  Positive for dyspnea on exertion and leg swelling (Occasional). Negative for chest pain.  Gastrointestinal:  Negative for melena.  Objective  Blood pressure 120/75, pulse 78, temperature 98.3 F (36.8 C), temperature source Temporal, resp. rate 16, height 5\' 10"  (1.778 m), weight 151 lb 12.8 oz (68.9 kg), SpO2 96 %. Body mass index is 21.78 kg/m.  Vitals with BMI 11/25/2021 06/08/2021 03/24/2021  Height 5\' 10"  5\' 10"  -  Weight 151 lbs 13 oz 150 lbs -  BMI 10.62 69.48 -  Systolic 546 270 350  Diastolic 75 89 71  Pulse 78 95 108     Physical Exam Neck:     Vascular: No carotid bruit or JVD.  Cardiovascular:     Rate and Rhythm: Normal rate and regular rhythm.     Pulses: Intact distal pulses.     Heart sounds: Normal heart sounds. No murmur heard.   No gallop.   Pulmonary:     Effort: Pulmonary effort is normal.     Breath sounds: Normal breath sounds.  Abdominal:     General: Bowel sounds are normal.     Palpations: Abdomen is soft.  Musculoskeletal:     Right lower leg: No edema.     Left lower leg: No edema.     Laboratory examination:   No results for input(s): NA, K, CL, CO2, GLUCOSE, BUN, CREATININE, CALCIUM, GFRNONAA, GFRAA in the last 8760 hours. CrCl cannot be calculated (Patient's  most recent lab result is older than the maximum 21 days allowed.).  CMP Latest Ref Rng & Units 12/07/2019 12/05/2019 12/04/2019  Glucose 70 - 99 mg/dL 101(H) 83 110(H)  BUN 8 - 23 mg/dL 5(L) 13 15  Creatinine 0.44 - 1.00 mg/dL 0.94 0.91 1.36(H)  Sodium 135 - 145 mmol/L 140 143 140  Potassium 3.5 - 5.1 mmol/L 4.1 3.1(L) 3.4(L)  Chloride 98 - 111 mmol/L 105 111 103  CO2 22 - 32 mmol/L 27 24 28   Calcium 8.9 - 10.3 mg/dL 8.8(L) 7.6(L) 8.9  Total Protein 6.5 - 8.1 g/dL - 5.3(L) 6.9  Total Bilirubin 0.3 - 1.2 mg/dL - 0.2(L) 0.4  Alkaline Phos 38 - 126 U/L - 67 84  AST 15 - 41 U/L - 13(L) 16  ALT 0 - 44 U/L - 13 16   CBC Latest Ref Rng & Units 12/05/2019 12/04/2019 10/31/2019  WBC 4.0 - 10.5 K/uL 7.0 8.8 6.5  Hemoglobin 12.0 - 15.0 g/dL 12.9 15.1(H) 13.5  Hematocrit 36.0 - 46.0 % 40.0 45.5 41.7  Platelets 150 - 400 K/uL 226 292 237    Lipid Panel No results for input(s): CHOL, TRIG, LDLCALC, VLDL, HDL, CHOLHDL, LDLDIRECT in the last 8760 hours. Lipid Panel  No results found for: CHOL, TRIG, HDL, CHOLHDL, VLDL, LDLCALC, LDLDIRECT, LABVLDL   HEMOGLOBIN A1C No results found for: HGBA1C, MPG TSH No results for input(s): TSH in the last 8760 hours.  External labs:   NA Medications and allergies   Allergies  Allergen Reactions   Other Shortness Of Breath    PURPLE LETTUCE   Betaseron [Interferon Beta-1b] Other (See Comments)    HARD NODULAR AREAS AT INJECTION SITE   Codeine Nausea And Vomiting   Gilenya [Fingolimod Hydrochloride] Other (See  Comments)    MACULAR EDEMA   Morphine And Related Other (See Comments)    IV ROUTE - RED STREAKS OF VEIN   Sumatriptan Nausea Only    INJECTABLE ONLY - RAPID HEART RATE, FLUSHING   Tysabri [Natalizumab] Other (See Comments)    DEVELOPED JCV ANTIBODY   Latex Rash     Medication prior to this encounter:   Outpatient Medications Prior to Visit  Medication Sig Dispense Refill   Biotin 5000 MCG CAPS Take 1 capsule by mouth daily.     calcium carbonate (OS-CAL) 600 MG TABS tablet Take 1,200 mg by mouth.      denosumab (PROLIA) 60 MG/ML SOLN injection Inject 60 mg into the skin every 6 (six) months. Administer in upper arm, thigh, or abdomen     Dexlansoprazole 30 MG capsule DR Take 30 mg by mouth daily.     donepezil (ARICEPT) 10 MG tablet TAKE 1 TABLET BY MOUTH EVERYDAY AT BEDTIME 90 tablet 4   estradiol (ESTRACE) 0.1 MG/GM vaginal cream Place vaginally.     furosemide (LASIX) 20 MG tablet Take 1 tablet by mouth as needed for edema.     lamoTRIgine (LAMICTAL) 200 MG tablet Take 1 tablet (200 mg total) by mouth 2 (two) times daily. 180 tablet 3   loratadine (CLARITIN) 10 MG tablet Take 10 mg by mouth daily.     Multiple Vitamins-Minerals (MULTIVITAMIN PO) Take 1 tablet by mouth daily. Brand name - Mature Multivitamin from [employer]     Probiotic Product ([place] PO) Take 1 tablet by mouth every morning.     rOPINIRole (REQUIP) 0.25 MG tablet TAKE 1 TO 2 TABLETS BY MOUTH AT NIGHT 180 tablet 0   rosuvastatin (CRESTOR)  5 MG tablet Take 5 mg by mouth at bedtime.     vitamin B-12 (CYANOCOBALAMIN) 1000 MCG tablet Take 1,000 mcg by mouth daily.     VITAMIN D PO Take 2,000 Units by mouth daily.     Amantadine HCl 100 MG tablet TAKE 1 TABLET(100 MG) BY MOUTH THREE TIMES DAILY 270 tablet 1   amphetamine-dextroamphetamine (ADDERALL) 15 MG tablet Take one po qAm and one po 4 hours later 60 tablet 0   Dalfampridine (4-AMINOPYRIDINE) POWD Take 5 mg by mouth 3 (three) times daily. Pharmacy  dispense 270 capsules with 3 refills. Thank you 270 g 3   No facility-administered medications prior to visit.     Medication list after today's encounter   Current Outpatient Medications  Medication Instructions   Biotin 5000 MCG CAPS 1 capsule, Oral, Daily   calcium carbonate (OS-CAL) 1,200 mg, Oral   denosumab (PROLIA) 60 mg, Subcutaneous, Every 6 months, Administer in upper arm, thigh, or abdomen    Dexlansoprazole 30 mg, Oral, Daily   donepezil (ARICEPT) 10 MG tablet TAKE 1 TABLET BY MOUTH EVERYDAY AT BEDTIME   estradiol (ESTRACE) 0.1 MG/GM vaginal cream Vaginal   furosemide (LASIX) 20 MG tablet 1 tablet, Oral, As needed   lamoTRIgine (LAMICTAL) 200 mg, Oral, 2 times daily   loratadine (CLARITIN) 10 mg, Oral, Daily   Multiple Vitamins-Minerals (MULTIVITAMIN PO) 1 tablet, Oral, Daily, Brand name - Mature Multivitamin from [employer]    Probiotic Product ([place] PO) 1 tablet, Oral, BH-each morning   rOPINIRole (REQUIP) 0.25 MG tablet TAKE 1 TO 2 TABLETS BY MOUTH AT NIGHT   rosuvastatin (CRESTOR) 5 mg, Oral, Daily at bedtime   vitamin B-12 (CYANOCOBALAMIN) 1,000 mcg, Oral, Daily   VITAMIN D PO 2,000 Units, Oral, Daily    Radiology:   No results found.  Cardiac Studies:   No results found for this or any previous visit from the past 1095 days.     No results found for this or any previous visit from the past 1095 days.   Coronary angiography 04/25/2019: Normal coronary arteries, LV gram revealing mid chamber hypokinesis, consistent with stress cardiomyopathy or myopericarditis.  EKG:   EKG 11/25/2021: Normal sinus rhythm with rate of 75 bpm, normal axis.  Incomplete right bundle branch block.  Otherwise normal EKG.    Assessment     ICD-10-CM   1. Dyspnea on exertion  R06.09 EKG 12-Lead    PCV ECHOCARDIOGRAM COMPLETE    2. Malaise and fatigue  R53.81    R53.83     3. Physical deconditioning  R53.81       Medications Discontinued During This  Encounter  Medication Reason   Amantadine HCl 100 MG tablet    amphetamine-dextroamphetamine (ADDERALL) 15 MG tablet    Dalfampridine (4-AMINOPYRIDINE) POWD     No orders of the defined types were placed in this encounter.  Orders Placed This Encounter  Procedures   EKG 12-Lead   PCV ECHOCARDIOGRAM COMPLETE    Standing Status:   Future    Standing Expiration Date:   11/25/2022   Recommendations:   [name] is a 69 y.o. Caucasian female patient with multiple sclerosis, has had history of transverse myelitis, residual bilateral leg weakness left worse than the right, last cardiac admission with "STEMI" in April 2020 revealing normal coronary arteries and mid anterior hypokinesis of the left ventricle, suggestive of either Takotsubo or myopericarditis.  Repeat echocardiogram had complete normalization of LVEF in 2020.  Patient referred  to me for evaluation of shortness of breath and dyspnea on exertion that started several months ago.  My suspicion is that she probably has significant deconditioning as she has not been on any medications for MS due to cost, she is also pushing herself to doing multiple things and has been involved with her husband's health care.  I would also consider evaluation for central sleep apnea in view of her neurologic issues.  I will repeat echocardiogram to exclude recurrence of LV dysfunction and evaluate for pulmonary hypertension as well.  If echocardiogram is normal, evaluation for noncardiac causes would be indicated.  Unless echocardiogram is abnormal, I will see her back on a as needed basis.  I will forward my note to Dr. Arlice Colt (Neuro).  If multiple sclerosis and deconditioning and sleep apnea are excluded, would then consider referral to physical rehabilitation.  Thank you for the consultation.    Adrian Prows, MD, [facility] 11/25/2021, 10:35 AM Office: [phone]

## 2021-11-26 ENCOUNTER — Other Ambulatory Visit: Payer: Self-pay | Admitting: Neurology

## 2021-11-26 DIAGNOSIS — G4719 Other hypersomnia: Secondary | ICD-10-CM

## 2021-11-26 DIAGNOSIS — I252 Old myocardial infarction: Secondary | ICD-10-CM

## 2021-11-26 DIAGNOSIS — G35 Multiple sclerosis: Secondary | ICD-10-CM

## 2021-11-30 ENCOUNTER — Telehealth: Payer: Self-pay | Admitting: *Deleted

## 2021-11-30 NOTE — Telephone Encounter (Signed)
LVM for pt relaying message. Advised sleep lab will reach out to schedule sleep study once authorized via insurance. Asked her to call back if she does not hear about getting scheduled.

## 2021-11-30 NOTE — Telephone Encounter (Signed)
-----   Message from Britt Bottom, MD sent at 11/26/2021  5:33 PM EST ----- Regarding: psg Please let her know that Dr. Einar Gip (cardiology) recommended that she have a sleep study performed due to her symptoms.  I placed the order.  (I see her for MS and can see her for the sleep issues.  )

## 2021-12-02 DIAGNOSIS — M79641 Pain in right hand: Secondary | ICD-10-CM | POA: Diagnosis not present

## 2021-12-02 DIAGNOSIS — M79642 Pain in left hand: Secondary | ICD-10-CM | POA: Diagnosis not present

## 2021-12-03 ENCOUNTER — Other Ambulatory Visit: Payer: Self-pay

## 2021-12-03 ENCOUNTER — Ambulatory Visit: Payer: PPO

## 2021-12-03 DIAGNOSIS — R0609 Other forms of dyspnea: Secondary | ICD-10-CM | POA: Diagnosis not present

## 2021-12-08 ENCOUNTER — Ambulatory Visit: Payer: PPO | Admitting: Neurology

## 2021-12-08 ENCOUNTER — Telehealth: Payer: Self-pay | Admitting: Neurology

## 2021-12-08 ENCOUNTER — Encounter: Payer: Self-pay | Admitting: Neurology

## 2021-12-08 VITALS — BP 127/78 | HR 86 | Ht 70.0 in | Wt 150.5 lb

## 2021-12-08 DIAGNOSIS — F988 Other specified behavioral and emotional disorders with onset usually occurring in childhood and adolescence: Secondary | ICD-10-CM

## 2021-12-08 DIAGNOSIS — M79601 Pain in right arm: Secondary | ICD-10-CM

## 2021-12-08 DIAGNOSIS — G373 Acute transverse myelitis in demyelinating disease of central nervous system: Secondary | ICD-10-CM

## 2021-12-08 DIAGNOSIS — R413 Other amnesia: Secondary | ICD-10-CM

## 2021-12-08 DIAGNOSIS — R0683 Snoring: Secondary | ICD-10-CM

## 2021-12-08 DIAGNOSIS — M5412 Radiculopathy, cervical region: Secondary | ICD-10-CM | POA: Diagnosis not present

## 2021-12-08 DIAGNOSIS — G4719 Other hypersomnia: Secondary | ICD-10-CM

## 2021-12-08 MED ORDER — LAMOTRIGINE 200 MG PO TABS: 200.0000 mg | ORAL_TABLET | Freq: Two times a day (BID) | ORAL | 3 refills | Status: DC

## 2021-12-08 MED ORDER — METHYLPHENIDATE HCL 10 MG PO TABS: 10.0000 mg | ORAL_TABLET | Freq: Two times a day (BID) | ORAL | 0 refills | Status: DC

## 2021-12-08 NOTE — Telephone Encounter (Signed)
LVM for pt to call me back to schedule sleep study  

## 2021-12-08 NOTE — Progress Notes (Signed)
[facility]  PATIENT: [name] DOB: Apr 13, 1951  REFERRING CLINICIAN: Crist Infante HISTORY FROM: patient   REASON FOR VISIT: transverse myelitis, MS?   HISTORICAL  CHIEF COMPLAINT:  Chief Complaint  Patient presents with   Follow-up    RM 1, alone. Ambulates w/ cane. Transverse myelitis f/u. Last MOCA 26/30.     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     Update 12/08/2021: She feels gait and balance are a little worse, including one fall in a food store and one in her garage.   Both occurred when she got off balanced with a turn.   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     She has seen [facility] due to her cervical issues.   She feels arms are strong. .  She had surgery by Dr. Sherwood Gambler ([place] and C6C7 fusion and removal of hardware at [facility])   She has pain around the shoulder and into the hand.   She has allodynia there as well.    She hgets tingling in her fingertips.     She feels she is doing cognitively better.   She still notes a lot of fluctuations.   She is on amantadine and Adderall as she tolerates these well.   She had difficulty with memory, processing and word finding.        She is driving and is doing ok with that most days but sometimes will miss a turn    Her husband handles finances.       A PSG in the past showed no OSA.     She also feels mood and fatigue are better.      She is on lamotrigine 200 mg po qd (prescribed bid).   She thinks gabaoentin helped her in the past.    In April 2021 she had an MI but catheterization was fine.   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    EF = 45%.  She notes SOB with exertion.    She has had SOB, with exertion.   She recently saw cardiology  She is tired and sleepy and dozes off easily.   She has been told she snores. She has woken up gasping  for air a few times.    Her husband has OSA.on CPAP  EPWORTH SLEEPINESS SCALE  On a scale of 0 - 3 what is the chance of dozing:  Sitting and Reading:   3 Watching TV:    3 Sitting inactive in a public place: 1 Passenger in car for one hour: 3 Lying down to rest in the afternoon: 3 Sitting and talking to someone: 0 Sitting quietly after lunch:  3 In a car, stopped in traffic:  1  Total (out of 24):   17/24   moderately excessively sleepy.      [facility]real Cognitive Assessment  12/08/2021 06/08/2021 12/08/2020 09/17/2019 03/14/2019  Visuospatial/ Executive (0/5) 4 4 2 2 3   Naming (0/3) 3 3 3 3 3   Attention: Read list of digits (0/2) 2 2 1  0 2  Attention: Read list of letters (0/1) 1 1 1 1 1   Attention: Serial 7 subtraction starting at 100 (0/3) 1 1 1 1 1   Language: Repeat phrase (0/2) 2 1 0 0 0  Language : Fluency (0/1) 1 1 0 0 1  Abstraction (0/2) 2 2 2  0 1  Delayed Recall (0/5) 3 5 4 3 2   Orientation (0/6) 6 6 5 6 6   Total 25 26 19 16 20   Adjusted Score (based on education) 25 26 19 16 20      TM/MS History:  In 1989, she had severe numbness in one side of her body.  She had clumsiness and poor gait.  MRI was reportedly normal.     She saw Dr. Erling Cruz who did an LP that showed oligoclonal bands.   She received IV steroids and was told she had MS.   In 1993, she woke up numb from the waist down in both legs.  This numbness was more intense than the prior episodes and gait was very poor.    She was admitted x 28 days, receiving many days of IV steroids followed by Rehab.     An MRI of the brain was normal but the MRI of the thoracic spine showed a plaque at T4.     She was started on Betaseron and did well in general.  She had some fluctuating symptom but nothing resembling any of the other 3 episodes.   In November 2014 she had additional imaging studies showing a normal MRI of the brain, an abnormal MRI of the thoracic spine with a focus at T4, and a normal cervical spinal cord. She did  have evidence of prior fusion surgery in the neck.  MRI of the cervical spine in September 2016 showed stable postoperative ACDF at C4-C5. The spinal cord appeared normal.   There was no myelopathy in the cervical spine. She also underwent a lumbar puncture in November 2014. It was normal and did not show oligoclonal bands or increased IgG index.    She has been treated with Betaseron, Avonex, Tysabri and Gilenya for presumptive MS in the past.   She stopped Tysabri as was JCV Ab positive and stopped Gilenya as she had macula edema.   She tried [place] but stopped due to insurance.  No DMT since 2014.      Imaging: MRI of the brain 09/15/2015 showed normal brain for age.  MRI of the cervical spine 09/25/2015 showed ACDF.  The spinal cord was normal.  My of the thoracic spine 02/19/2015 showed a single focus adjacent to T4 consistent with a chronic demyelinating plaque or chronic myelitis.  No change compared to 2014.  CT myelogram cervical spine 04/20/2019 showed a left central disc protrusion at C6-C7 effacing the ventral CSF.  There is no foraminal narrowing.  There is moderate foraminal narrowing bilaterally at C5-C6 but no spinal stenosis.  X-ray 05/17/2019 showed prior fusion at C3-C5 and fusion at C5-C7 associated with anterior fixation hardware.  MRI cervical spine MRI shows stable ACDF.  No spinal stenosis or nerve root  compression.      REVIEW OF SYSTEMS:  Constitutional: No fevers, chills, sweats, or change in .  She reports fatigue Eyes: as above Ear, nose and throat: No hearing loss, ear pain, nasal congestion, sore throat Cardiovascular: No chest pain, palpitations Respiratory:  No shortness of breath at rest or with exertion.   No wheezes GastrointestinaI: No nausea, vomiting, diarrhea, abdominal pain.  Reports fecal incontinence.   Reports dysphagia Genitourinary:  see above Musculoskeletal:  No neck pain, back pain.   Hasmuscle cramps Integumentary: No rash, pruritus, skin  lesions Neurological: as above Psychiatric: No depression at this time.  No anxiety Endocrine: No palpitations, diaphoresis, change in appetite, change in weigh  or increased thirst Hematologic/Lymphatic:  No anemia, purpura, petechiae. Allergic/Immunologic: No itchy/runny eyes, nasal congestion, recent allergic reactions, rashes  ALLERGIES: Allergies  Allergen Reactions   Other Shortness Of Breath    PURPLE LETTUCE   Betaseron [Interferon Beta-1b] Other (See Comments)    HARD NODULAR AREAS AT INJECTION SITE   Codeine Nausea And Vomiting   Gilenya [Fingolimod Hydrochloride] Other (See Comments)    MACULAR EDEMA   Morphine And Related Other (See Comments)    IV ROUTE - RED STREAKS OF VEIN   Sumatriptan Nausea Only    INJECTABLE ONLY - RAPID HEART RATE, FLUSHING   Tysabri [Natalizumab] Other (See Comments)    DEVELOPED JCV ANTIBODY   Latex Rash    HOME MEDICATIONS: Outpatient Medications Prior to Visit  Medication Sig Dispense Refill   Biotin 5000 MCG CAPS Take 1 capsule by mouth daily.     calcium carbonate (OS-CAL) 600 MG TABS tablet Take 1,200 mg by mouth.      denosumab (PROLIA) 60 MG/ML SOLN injection Inject 60 mg into the skin every 6 (six) months. Administer in upper arm, thigh, or abdomen     Dexlansoprazole 30 MG capsule DR Take 30 mg by mouth daily.     donepezil (ARICEPT) 10 MG tablet TAKE 1 TABLET BY MOUTH EVERYDAY AT BEDTIME 90 tablet 4   estradiol (ESTRACE) 0.1 MG/GM vaginal cream Place vaginally.     furosemide (LASIX) 20 MG tablet Take 1 tablet by mouth as needed for edema.     loratadine (CLARITIN) 10 MG tablet Take 10 mg by mouth daily.     Multiple Vitamins-Minerals (MULTIVITAMIN PO) Take 1 tablet by mouth daily. Brand name - Mature Multivitamin from [employer]     Probiotic Product ([place] PO) Take 1 tablet by mouth every morning.     rOPINIRole (REQUIP) 0.25 MG tablet TAKE 1 TO 2 TABLETS BY MOUTH AT NIGHT 180 tablet 0   rosuvastatin (CRESTOR) 5  MG tablet Take 5 mg by mouth at bedtime.     vitamin B-12 (CYANOCOBALAMIN) 1000 MCG tablet Take 1,000 mcg by mouth daily.     VITAMIN D PO Take 2,000 Units by mouth daily.     lamoTRIgine (LAMICTAL) 200 MG tablet Take 1 tablet (200 mg total) by mouth 2 (two) times daily. 180 tablet 3   No facility-administered medications prior to visit.    PAST MEDICAL HISTORY: Past Medical History:  Diagnosis Date   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   Arthritis    back   Asthma    20 yrs., ago one episode   Chronic migraine headaches 03/13/2008   Chronic rhinitis 03/13/2008   Deviated septum 01/23/2020   Dysesthesia    bilateral feet   Dysphagia, oropharyngeal phase 10/21/2009   Dyspnea    Gastroesophageal reflux disease 09/10/2008   Heart murmur    first noticed 41 yrs. 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 , low grade appendiceal mucinous neoplasm    HNP (herniated nucleus pulposus), cervical 05/17/2019   Hypokalemia 12/04/2019   Mitral valve prolapse    states no problem   Multiple sclerosis 1989   Myocarditis 04/25/2019   Neurogenic bladder    Obstructive chronic bronchitis with exacerbation 09/10/2008   Qualifier: Diagnosis of  By: Joya Gaskins MD, Burnett Harry  Occipital neuralgia 05/06/2015   Osteoporosis    Pneumonia    PONV (postoperative nausea and vomiting)    Pulmonary nodule, right upper lobe 01/22/2009   Short of breath on exertion    Sjogren's disease    dryness of eyes, mouth   Spastic gait    Transverse myelitis    T4   Urinary frequency 02/05/2015   Vitamin D deficiency 12/09/2016   Vocal cord disorder 10/21/2009   Wears bilateral ankle braces    AFO braces for stability    PAST SURGICAL HISTORY: Past Surgical History:  Procedure Laterality Date   ANAL RECTAL MANOMETRY N/A 08/18/2016   Procedure: ANO RECTAL MANOMETRY;  Surgeon:  Leighton Ruff, MD;  Location: [facility];  Service: Endoscopy;  Laterality: N/A;   ANTERIOR CERVICAL DECOMP/DISCECTOMY FUSION  2004;   2003   C4 -- C5 (2004)/   C3 -- C4 (2003)   ANTERIOR CERVICAL DECOMP/DISCECTOMY FUSION N/A 05/17/2019   Procedure: EVACUATION HEMATOMA OF POST OP ANTERIOR CERVICAL DECOMPRESSION FUSION;  Surgeon: Jovita Gamma, MD;  Location: [place];  Service: Neurosurgery;  Laterality: N/A;   ANTERIOR CERVICAL DECOMP/DISCECTOMY FUSION N/A 05/17/2019   Procedure: REMOVAL OF TETHER CERVICAL PLATE, ANTERIOR CERVICAL DECOMPRESSION/DISCECTOMY FUSION CERVICAL FIVE- CERVICAL SIX, CERVICAL SIX- CERVICAL SEVEN;  Surgeon: Jovita Gamma, MD;  Location: [facility];  Service: Neurosurgery;  Laterality: N/A;   APPENDECTOMY  06/22/2011   laparoscopic   BILATERAL SALPINGOOPHORECTOMY  1982   BLADDER SUSPENSION  1994   sling   BREAST ENHANCEMENT SURGERY Bilateral    BREAST IMPLANT REMOVAL Bilateral 12/03/2008   BUNIONECTOMY     CATARACT EXTRACTION W/ INTRAOCULAR LENS  IMPLANT, BILATERAL  2015   ELBOW SURGERY Right    HEMORROIDECTOMY  08/05/2010;  2013   INSERTION SACRAL NERVE STIMULATOR TEST WIRE  09-28-2016   dr Marcello Moores   INTERCOSTAL NERVE BLOCK  2015   occipital   MENISCUS REPAIR Left 09/26/2018   RECTAL ULTRASOUND N/A 08/18/2016   Procedure: RECTAL ULTRASOUND;  Surgeon: Leighton Ruff, MD;  Location: [facility];  Service: Endoscopy;  Laterality: N/A;   SHOULDER ARTHROSCOPY Left    SHOULDER ARTHROSCOPY WITH DISTAL CLAVICLE RESECTION Right 09/21/2007   w/  Acrominoplasty,  Debridement Rotator Cuff,  CA ligament release   TONSILLECTOMY  age 53   TRIGGER FINGER RELEASE Left 01/16/2015   Procedure: LEFT THUMB TRIGGER RELEASE ;  Surgeon: Leanora Cover, MD;  Location: [facility];  Service: Orthopedics;  Laterality: Left;   VAGINAL HYSTERECTOMY  1981    FAMILY HISTORY: Family History  Problem Relation Age of Onset   Liver disease Father    Cirrhosis Father    Stroke Mother     Cancer Mother        oropharyngeal   Heart attack Mother    Anesthesia problems Mother        post-op nausea   Cancer Brother        twin; tonsillar?   Cancer Sister 9       breast ca, lung ca   Cancer Other        Nephew; appendiceal carcnoid, melanoma   Cancer Paternal Grandmother        cervical    SOCIAL HISTORY:  Social History   Socioeconomic History   Marital status: Married    Spouse name: [name]   Number of children: 2   Years of education: 13   Highest education level: Some college, no degree  Occupational History   Occupation: Retired  Tobacco Use  Smoking status: Never    Passive exposure: Yes   Smokeless tobacco: Never   Tobacco comments:    parents, husband, & multiple family members  Substance and Sexual Activity   Alcohol use: No    Alcohol/week: 0.0 standard drinks   Drug use: No   Sexual activity: Not on file  Other Topics Concern   Not on file  Social History Narrative   Pt lives at home with her spouse of 92 years.   Caffeine Use- Drinks caffeine occasionally.      [facility]:   From [place]. Previously did administrative work. She currently has a cat & dog. No bird exposure. No mold or hot tub exposure. No carpet or draperies.       Social Determinants of Health   Financial Resource Strain: Not on file  Food Insecurity: Not on file  Transportation Needs: Not on file  Physical Activity: Not on file  Stress: Not on file  Social Connections: Not on file  Intimate Partner Violence: Not on file     PHYSICAL EXAM  Vitals:   12/08/21 1258  BP: 127/78  Pulse: 86  Weight: 150 lb 8 oz (68.3 kg)  Height: 5\' 10"  (1.778 m)    Body mass index is 21.59 kg/m.   General: The patient is well-developed and well-nourished and in no acute distress  Neck:    She has tenderness in the occiput, right greater than left..  Range of motion is slightly reduced in the neck..    Neurologic Exam  Mental status: The patient is alert and  oriented at the time of the examination.  She scored 25 /30 on the [facility] cognitive assessment (details are above).  ..  Speech was normal..    Cranial nerves: Extraocular movements are full.  Facial strength is normal.. No dysarthria is noted.    Motor:  Very mild essential tremor noted with writing.   Muscle bulk and tone are normal.  Strength was 4/5 in the right triceps, 4-/5 ulnar intrinsic, 4/5 APB, 4++ in right biceps.  Strength was 5/5 in the left arm.  Rapid altering movements were performed slower on the right than the left.   Strength in her legs was 4/5 proximally and 4 -/5 distally  Sensory: There is reduced sensation to touch below the mid thoracic level.  She has reduced vibration sensation in both legs symmetrically.   She has reduced sensation in the C8 dermatome of the arm (also reduced vibration of 5th digit more c/w C8 than ulnar)  Coordination: Cerebellar testing shows good finger-nose-finger but reduced heel-to-shin bilaterally..  Gait and station: Station is stable eyes open.  Her gait is spastic with bilateral foot drops, left greater than right.   She can walk without unilateral assistance though was fairly ataxic.Marland Kitchen She cannot do a tandem walk.  Romberg is positive.  Reflexes: Deep tendon reflexes are symmetric and normal in the arms but increased at the legs with crossed abductors at the knees.  No ankle clonus       ASSESSMENT AND PLAN  Transverse myelitis (HCC)  Snoring - Plan: PSG SLEEP STUDY  Excessive daytime sleepiness - Plan: PSG SLEEP STUDY  Memory loss  Cervical radiculopathy at C8  Right arm pain  Attention deficit disorder, unspecified hyperactivity presence   1.   She has transverse myelitis (T4) with fairly stable exam..  She does not have additional lesions and MS is less likely. 2.   Switch Adderall to methylphenidate as was expensive.  Continue amantadine for ADD, fatigue.  Continue ropinirole for restless legs  3.   Use cane or walker  for safety.   4.   Further she has had concerns about memory, her performance on the [facility] cognitive assessment is borderline normal and improved compared to a couple of years ago.  Imaging shows no atrophy.   Likely mood and sleep play a role in cognitive difficulties.   5.   RTC 6 months, sooner if problems or based on the results.   Kimaria Struthers A. Felecia Shelling, MD, PhD 25/50/0164, 2:90 PM Certified in Neurology, Clinical Neurophysiology, Sleep Medicine, Pain Medicine and Neuroimaging  [facility] [address], [place], [place] [address] [phone]

## 2021-12-09 ENCOUNTER — Telehealth: Payer: Self-pay | Admitting: *Deleted

## 2021-12-09 NOTE — Telephone Encounter (Signed)
Approved until 12/09/2022 with [employer]ir

## 2021-12-09 NOTE — Telephone Encounter (Signed)
Submitted PA methylphenidate on CMM. [employer] - PA Case ID: [id number] - Rx #: [id number]. Waiting on determination from [employer].

## 2021-12-15 ENCOUNTER — Telehealth: Payer: Self-pay | Admitting: Neurology

## 2021-12-15 NOTE — Telephone Encounter (Signed)
LVM for pt to call me back to schedule sleep study  

## 2021-12-17 ENCOUNTER — Telehealth: Payer: Self-pay | Admitting: Neurology

## 2021-12-17 NOTE — Telephone Encounter (Signed)
Called pt today to schedule her sleep study. However, pt states she has covid and would like to receive a call back next week.

## 2021-12-22 ENCOUNTER — Encounter (HOSPITAL_BASED_OUTPATIENT_CLINIC_OR_DEPARTMENT_OTHER): Payer: Self-pay

## 2021-12-22 ENCOUNTER — Emergency Department (HOSPITAL_BASED_OUTPATIENT_CLINIC_OR_DEPARTMENT_OTHER): Payer: PPO

## 2021-12-22 ENCOUNTER — Emergency Department (HOSPITAL_BASED_OUTPATIENT_CLINIC_OR_DEPARTMENT_OTHER)
Admission: EM | Admit: 2021-12-22 | Discharge: 2021-12-22 | Disposition: A | Discharge status: Home / Self Care | Payer: PPO | Attending: Emergency Medicine | Admitting: Emergency Medicine

## 2021-12-22 ENCOUNTER — Other Ambulatory Visit: Payer: Self-pay

## 2021-12-22 ENCOUNTER — Emergency Department (HOSPITAL_BASED_OUTPATIENT_CLINIC_OR_DEPARTMENT_OTHER): Payer: PPO | Admitting: Radiology

## 2021-12-22 ENCOUNTER — Other Ambulatory Visit (HOSPITAL_BASED_OUTPATIENT_CLINIC_OR_DEPARTMENT_OTHER): Payer: Self-pay

## 2021-12-22 DIAGNOSIS — R0781 Pleurodynia: Secondary | ICD-10-CM | POA: Diagnosis not present

## 2021-12-22 DIAGNOSIS — S2242XA Multiple fractures of ribs, left side, initial encounter for closed fracture: Secondary | ICD-10-CM | POA: Insufficient documentation

## 2021-12-22 DIAGNOSIS — Z79899 Other long term (current) drug therapy: Secondary | ICD-10-CM | POA: Diagnosis not present

## 2021-12-22 DIAGNOSIS — S2341XA Sprain of ribs, initial encounter: Secondary | ICD-10-CM | POA: Diagnosis present

## 2021-12-22 DIAGNOSIS — R9389 Abnormal findings on diagnostic imaging of other specified body structures: Secondary | ICD-10-CM

## 2021-12-22 DIAGNOSIS — Z91040 Latex allergy status: Secondary | ICD-10-CM | POA: Insufficient documentation

## 2021-12-22 DIAGNOSIS — R918 Other nonspecific abnormal finding of lung field: Secondary | ICD-10-CM | POA: Diagnosis not present

## 2021-12-22 DIAGNOSIS — J45909 Unspecified asthma, uncomplicated: Secondary | ICD-10-CM | POA: Diagnosis not present

## 2021-12-22 DIAGNOSIS — I700 Atherosclerosis of aorta: Secondary | ICD-10-CM | POA: Diagnosis not present

## 2021-12-22 DIAGNOSIS — J9811 Atelectasis: Secondary | ICD-10-CM | POA: Diagnosis not present

## 2021-12-22 DIAGNOSIS — W010XXA Fall on same level from slipping, tripping and stumbling without subsequent striking against object, initial encounter: Secondary | ICD-10-CM | POA: Diagnosis not present

## 2021-12-22 DIAGNOSIS — R911 Solitary pulmonary nodule: Secondary | ICD-10-CM | POA: Diagnosis not present

## 2021-12-22 MED ORDER — HYDROCODONE-ACETAMINOPHEN 5-325 MG PO TABS: 1.0000 | ORAL_TABLET | Freq: Four times a day (QID) | ORAL | 0 refills | Status: DC | PRN

## 2021-12-22 MED ORDER — FENTANYL CITRATE PF 50 MCG/ML IJ SOSY
50.0000 ug | PREFILLED_SYRINGE | Freq: Once | INTRAMUSCULAR | Status: AC
  Administered 2021-12-22: 12:00:00 50 ug via INTRAMUSCULAR
  Filled 2021-12-22: qty 1

## 2021-12-22 MED ORDER — HYDROCODONE-ACETAMINOPHEN 5-325 MG PO TABS
ORAL_TABLET | ORAL | 0 refills | Status: DC
  Filled 2021-12-22: qty 30, 7d supply, fill #0

## 2021-12-22 MED ORDER — IBUPROFEN 600 MG PO TABS: 600.0000 mg | ORAL_TABLET | Freq: Four times a day (QID) | ORAL | 0 refills | Status: DC | PRN

## 2021-12-22 NOTE — ED Provider Notes (Signed)
[facility] DEPT Provider Note   CSN: [id number] Arrival date & time: 12/22/21  1026     History Chief Complaint  Patient presents with   [name] is a 70 y.o. female.  Patient is a 70 yo female presenting for rib pain after fall that occurred yesterday. Fall was mechanical, tripped over wires, fell forward. Pain to left ribs only. Denies head trauma or LOC. Denies blood thinner use. Taking motrin at home for pain with minimal relief.   The history is provided by the patient. No language interpreter was used.  Fall Associated symptoms include chest pain. Pertinent negatives include no abdominal pain and no shortness of breath.      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 , low grade appendiceal mucinous neoplasm    HNP (herniated nucleus pulposus), cervical 05/17/2019   Hypokalemia 12/04/2019   Mitral valve prolapse    states no problem   Multiple sclerosis 1989   Myocarditis 04/25/2019   Neurogenic bladder    Obstructive chronic bronchitis with exacerbation 09/10/2008   Qualifier: Diagnosis of  By: Joya Gaskins MD, Burnett Harry    Occipital neuralgia 05/06/2015   Osteoporosis    Pneumonia    PONV (postoperative nausea and vomiting)    Pulmonary nodule, right upper lobe 01/22/2009   Short of breath on exertion    Sjogren's disease    dryness of eyes, mouth   Spastic gait    Transverse myelitis     T4   Urinary frequency 02/05/2015   Vitamin D deficiency 12/09/2016   Vocal cord disorder 10/21/2009   Wears bilateral ankle braces    AFO braces for stability    Patient Active Problem List   Diagnosis Date Noted   Numbness and tingling of right arm 06/08/2021   Right arm pain 12/08/2020   Cervical radiculopathy at C8 12/08/2020   History of fusion of cervical spine 12/08/2020   Attention deficit disorder 12/08/2020   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   Deviated septum 01/23/2020   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/23/2020   Dehydration 12/05/2019   Nausea 12/04/2019   Diarrhea 12/04/2019   Hypokalemia 12/04/2019   Lower extremity edema 10/25/2019   HNP (herniated nucleus pulposus), cervical 05/17/2019   Palpitations 05/07/2019   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   Memory loss 13/24/4010   Acute canaliculitis of left lacrimal passage 03/05/2019   Epiphora due to insufficient drainage of both sides 03/05/2019   Myogenic ptosis of left eyelid 03/05/2019   Excessive daytime sleepiness 10/17/2017   Vitamin D deficiency 12/09/2016   Hoarseness of voice 08/12/2016   Positional vertigo 08/12/2016   Sjogren's disease 05/03/2016   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   Dysesthesia 05/06/2015   Neck pain 05/06/2015   Occipital neuralgia 05/06/2015   Spastic gait 02/05/2015  Urinary frequency 02/05/2015   Other fatigue 02/05/2015   Nipple discharge 08/04/2012   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   Vocal cord disorder 10/21/2009   Dysphagia, oropharyngeal phase 10/21/2009   Pulmonary nodule, right upper lobe 01/22/2009   Dyspnea 09/24/2008   Obstructive chronic bronchitis with exacerbation ([place]) 09/10/2008   Gastroesophageal reflux disease 09/10/2008   Multiple sclerosis ([place]) 03/13/2008   Chronic rhinitis 03/13/2008   Cough 03/13/2008    Past Surgical History:  Procedure Laterality Date   ANAL RECTAL MANOMETRY N/A 08/18/2016   Procedure: ANO RECTAL  MANOMETRY;  Surgeon: Leighton Ruff, MD;  Location: [facility];  Service: Endoscopy;  Laterality: N/A;   ANTERIOR CERVICAL DECOMP/DISCECTOMY FUSION  2004;   2003   C4 -- C5 (2004)/   C3 -- C4 (2003)   ANTERIOR CERVICAL DECOMP/DISCECTOMY FUSION N/A 05/17/2019   Procedure: EVACUATION HEMATOMA OF POST OP ANTERIOR CERVICAL DECOMPRESSION FUSION;  Surgeon: Jovita Gamma, MD;  Location: [facility];  Service: Neurosurgery;  Laterality: N/A;   ANTERIOR CERVICAL DECOMP/DISCECTOMY FUSION N/A 05/17/2019   Procedure: REMOVAL OF TETHER CERVICAL PLATE, ANTERIOR CERVICAL DECOMPRESSION/DISCECTOMY FUSION CERVICAL FIVE- CERVICAL SIX, CERVICAL SIX- CERVICAL SEVEN;  Surgeon: Jovita Gamma, MD;  Location: [facility];  Service: Neurosurgery;  Laterality: N/A;   APPENDECTOMY  06/22/2011   laparoscopic   BILATERAL SALPINGOOPHORECTOMY  1982   BLADDER SUSPENSION  1994   sling   BREAST ENHANCEMENT SURGERY Bilateral    BREAST IMPLANT REMOVAL Bilateral 12/03/2008   BUNIONECTOMY     CATARACT EXTRACTION W/ INTRAOCULAR LENS  IMPLANT, BILATERAL  2015   ELBOW SURGERY Right    HEMORROIDECTOMY  08/05/2010;  2013   INSERTION SACRAL NERVE STIMULATOR TEST WIRE  09-28-2016   dr Marcello Moores   INTERCOSTAL NERVE BLOCK  2015   occipital   MENISCUS REPAIR Left 09/26/2018   RECTAL ULTRASOUND N/A 08/18/2016   Procedure: RECTAL ULTRASOUND;  Surgeon: Leighton Ruff, MD;  Location: [facility];  Service: Endoscopy;  Laterality: N/A;   SHOULDER ARTHROSCOPY Left    SHOULDER ARTHROSCOPY WITH DISTAL CLAVICLE RESECTION Right 09/21/2007   w/  Acrominoplasty,  Debridement Rotator Cuff,  CA ligament release   TONSILLECTOMY  age 72   TRIGGER FINGER RELEASE Left 01/16/2015   Procedure: LEFT THUMB TRIGGER RELEASE ;  Surgeon: Leanora Cover, MD;  Location: [facility];  Service: Orthopedics;  Laterality: Left;   VAGINAL HYSTERECTOMY  1981     OB History   No obstetric history on file.     Family History  Problem Relation Age of Onset    Liver disease Father    Cirrhosis Father    Stroke Mother    Cancer Mother        oropharyngeal   Heart attack Mother    Anesthesia problems Mother        post-op nausea   Cancer Brother        twin; tonsillar?   Cancer Sister 22       breast ca, lung ca   Cancer Other        Nephew; appendiceal carcnoid, melanoma   Cancer Paternal Grandmother        cervical    Social History   Tobacco Use   Smoking status: Never    Passive exposure: Yes   Smokeless tobacco: Never   Tobacco comments:    parents, husband, & multiple family members  Substance Use Topics   Alcohol use: No    Alcohol/week: 0.0 standard drinks   Drug use: No  Home Medications Prior to Admission medications   Medication Sig Start Date End Date Taking? Authorizing Provider  HYDROcodone-acetaminophen (NORCO) 5-325 MG tablet Take 1 tablet by mouth every 6 (six) hours as needed for moderate pain. 25/00/37  Yes Campbell Stall P, DO  ibuprofen (ADVIL) 600 MG tablet Take 1 tablet (600 mg total) by mouth every 6 (six) hours as needed. 04/88/89  Yes Campbell Stall P, DO  Biotin 1694 MCG CAPS Take 1 capsule by mouth daily.    [provider]  calcium carbonate (OS-CAL) 600 MG TABS tablet Take 1,200 mg by mouth.     [provider]  denosumab (PROLIA) 60 MG/ML SOLN injection Inject 60 mg into the skin every 6 (six) months. Administer in upper arm, thigh, or abdomen    [provider]  Dexlansoprazole 30 MG capsule DR Take 30 mg by mouth daily.    [provider]  donepezil (ARICEPT) 10 MG tablet TAKE 1 TABLET BY MOUTH EVERYDAY AT BEDTIME 12/16/20   Sater, Nanine Means, MD  estradiol (ESTRACE) 0.1 MG/GM vaginal cream Place vaginally. 09/28/21   [provider]  furosemide (LASIX) 20 MG tablet Take 1 tablet by mouth as needed for edema.    [provider]  lamoTRIgine (LAMICTAL) 200 MG tablet Take 1 tablet (200 mg total) by mouth 2 (two) times daily. 12/08/21   Sater, Nanine Means, MD  loratadine (CLARITIN) 10 MG tablet Take 10 mg by mouth daily.    [provider]  methylphenidate (RITALIN) 10 MG tablet Take 1 tablet (10 mg total) by mouth 2 (two) times daily. 12/08/21   Sater, Nanine Means, MD  Multiple Vitamins-Minerals (MULTIVITAMIN PO) Take 1 tablet by mouth daily. Brand name - Mature Multivitamin from [employer], Historical, MD  Probiotic Product ([place] PO) Take 1 tablet by mouth every morning.    [provider]  rOPINIRole (REQUIP) 0.25 MG tablet TAKE 1 TO 2 TABLETS BY MOUTH AT NIGHT 11/16/21   Sater, Nanine Means, MD  rosuvastatin (CRESTOR) 5 MG tablet Take 5 mg by mouth at bedtime.    [provider]  vitamin B-12 (CYANOCOBALAMIN) 1000 MCG tablet Take 1,000 mcg by mouth daily.    [provider]  VITAMIN D PO Take 2,000 Units by mouth daily.    [provider]    Allergies    Other, Betaseron [interferon beta-1b], Codeine, Gilenya [fingolimod hydrochloride], Morphine and related, Sumatriptan, Tysabri [natalizumab], and Latex  Review of Systems   Review of Systems  Constitutional:  Negative for chills and fever.  HENT:  Negative for ear pain and sore throat.   Eyes:  Negative for pain and visual disturbance.  Respiratory:  Negative for cough and shortness of breath.   Cardiovascular:  Positive for chest pain. Negative for palpitations.  Gastrointestinal:  Negative for abdominal pain and vomiting.  Genitourinary:  Negative for dysuria and hematuria.  Musculoskeletal:  Negative for arthralgias and back pain.  Skin:  Negative for color change and rash.  Neurological:  Negative for seizures and syncope.  All other systems reviewed and are negative.  Physical Exam Updated Vital Signs BP (!) 150/82 (BP Location: Right Arm)    Pulse 67    Temp 98.4 F (36.9 C) (Oral)    Resp 16    SpO2 97%   Physical Exam Vitals and nursing note reviewed.  Constitutional:      General: She is not in  acute distress.    Appearance:  She is well-developed.  HENT:     Head: Normocephalic and atraumatic.  Eyes:     Conjunctiva/sclera: Conjunctivae normal.  Cardiovascular:     Rate and Rhythm: Normal rate and regular rhythm.     Heart sounds: No murmur heard. Pulmonary:     Effort: Pulmonary effort is normal. No respiratory distress.     Breath sounds: Normal breath sounds.  Chest:     Chest wall: Tenderness present.    Abdominal:     Palpations: Abdomen is soft.     Tenderness: There is no abdominal tenderness.  Musculoskeletal:        General: No swelling.     Cervical back: Neck supple.  Skin:    General: Skin is warm and dry.     Capillary Refill: Capillary refill takes less than 2 seconds.  Neurological:     Mental Status: She is alert.  Psychiatric:        Mood and Affect: Mood normal.    ED Results / Procedures / Treatments   Labs (all labs ordered are listed, but only abnormal results are displayed) Labs Reviewed - No data to display  EKG None  Radiology DG Ribs Unilateral W/Chest Left  Result Date: 12/22/2021 CLINICAL DATA:  Fall, left-sided pain under breast EXAM: LEFT RIBS AND CHEST - 3+ VIEW COMPARISON:  Chest radiograph 12/04/2019 FINDINGS: The cardiomediastinal silhouette is normal. Linear opacities in the lateral left base likely reflects atelectasis or scar. There is no other focal consolidation. There is no pulmonary edema. There is no pleural effusion or pneumothorax. No displaced rib fracture or other acute abnormality is seen at the indicated site of pain. IMPRESSION: 1. No displaced rib fracture or other abnormality identified at the indicated site of pain. 2. Left basilar atelectasis and/or scar. Electronically Signed   By: Valetta Mole M.D.   On: 12/22/2021 11:39   CT Chest Wo Contrast  Result Date: 12/22/2021 CLINICAL DATA:  Chest trauma, left rib pain EXAM: CT CHEST WITHOUT CONTRAST TECHNIQUE: Multidetector CT imaging of the chest was performed  following the standard protocol without IV contrast. COMPARISON:  Multiple exams, including rib radiographs from 12/22/2021 FINDINGS: Cardiovascular: Coronary, aortic arch, and branch vessel atherosclerotic vascular disease. Mediastinum/Nodes: Unremarkable Lungs/Pleura: Biapical pleuroparenchymal scarring. New 14 by 9 by 11 mm (volume = 700 mm^3) right middle lobe pulmonary nodule abutting the major fissure on image 87 series 4. Chronically stable 3 mm apical segment right upper lobe pulmonary nodule on image 32 series 4. There is bandlike atelectasis anteriorly in the left lower lobe and to a lesser extent inferiorly in the lingula. Upper Abdomen: Unremarkable Musculoskeletal: Acute fracture of the left anterior fifth rib on image 107 series 7. Suspected nondisplaced fractures of the left anterior fourth and seventh ribs. Lower cervical anterior plate and screw fixators. IMPRESSION: 1. New 14 by 9 by 11 mm right middle lobe pulmonary nodule abutting the major fissure. Malignancy is a distinct possibility possibility. Based on current guidelines I recommend either PET-CT, a surveillance non-contrast Chest CT at 3 months, or tissue sampling. These guidelines do not apply to immunocompromised patients and patients with cancer. Follow up in patients with significant comorbidities as clinically warranted. For lung cancer screening, adhere to Lung-RADS guidelines. Reference: Radiology. 2017; 284(1):228-43. 2. Acute left anterior fifth rib fracture with suspected subtle nondisplaced fractures of the left anterior fourth and seventh ribs. 3. Bandlike atelectasis in the left lower lobe and to a lesser extent in the lingula. 4.  Aortic Atherosclerosis (ICD10-I70.0).  Coronary atherosclerosis. 5. Biapical pleuroparenchymal scarring. Electronically Signed   By: Van Clines M.D.   On: 12/22/2021 13:58    Procedures Procedures   Medications Ordered in ED Medications  fentaNYL (SUBLIMAZE) injection 50 mcg (50 mcg  Intramuscular Given 12/22/21 1156)    ED Course  I have reviewed the triage vital signs and the nursing notes.  Pertinent labs & imaging results that were available during my care of the patient were reviewed by me and considered in my medical decision making (see chart for details).    MDM Rules/Calculators/A&P                         3:31 PM 70 yo female presenting for rib pain after fall that occurred yesterday. Patient is Aox3, no acute distress, afebrile, with stable vitals. Physical exam demonstrates tenderness to palpation of rib 4-6. No ecchymosis.   Fentanyl given for pain. CT chest demonstrates rib fractures. Norco and motrin prescription sent to pharmacy. Incentive spirometry and instructions given in ED.   Patient in no distress and overall condition improved here in the ED. Detailed discussions were had with the patient regarding current findings, and need for close f/u with PCP or on call doctor. The patient has been instructed to return immediately if the symptoms worsen in any way for re-evaluation. Patient verbalized understanding and is in agreement with current care plan. All questions answered prior to discharge.   Final Clinical Impression(s) / ED Diagnoses Final diagnoses:  Closed fracture of multiple ribs of left side, initial encounter  Abnormal CT of the chest    Rx / DC Orders ED Discharge Orders          Ordered    HYDROcodone-acetaminophen (NORCO) 5-325 MG tablet  Every 6 hours PRN        12/22/21 1529    Incentive spirometry RT        12/22/21 1529    ibuprofen (ADVIL) 600 MG tablet  Every 6 hours PRN        12/22/21 1530             Lianne Cure, DO 42/68/34 1531

## 2021-12-22 NOTE — ED Notes (Signed)
Patient transported to X-ray 

## 2021-12-22 NOTE — ED Notes (Signed)
Patient instructed on the use and indication for IS. Patient demonstrated good effort and understanding.

## 2021-12-22 NOTE — ED Triage Notes (Signed)
Pt reports she had a mechanical fall yesterday afternoon. Reports she tripped over some wires, fell onto hardwood floors landing on her Left lateral side. Pt c/o stabbing pain to her mid-sternum chest area. Pt reports she heard a "popping" noise in her chest when she fell. Pt has a pillow that she is using to brace her chest area. Pain with slight movement, clearing her throat or with deep breathing. Pt states she had  "goose egg" on her Left forehead. No neuro deficits noted. Pt denies taking blood thinners, no LOC or N/V

## 2021-12-24 ENCOUNTER — Encounter (HOSPITAL_COMMUNITY): Payer: Self-pay | Admitting: Radiology

## 2021-12-30 DIAGNOSIS — I509 Heart failure, unspecified: Secondary | ICD-10-CM | POA: Diagnosis not present

## 2021-12-30 DIAGNOSIS — G35 Multiple sclerosis: Secondary | ICD-10-CM | POA: Diagnosis not present

## 2021-12-31 ENCOUNTER — Telehealth: Payer: Self-pay | Admitting: Neurology

## 2021-12-31 NOTE — Telephone Encounter (Signed)
Pt was called to schedule her sleep study but pt would like to call back at a later time due to her dealing with fractured ribs.

## 2022-01-01 DIAGNOSIS — S129XXD Fracture of neck, unspecified, subsequent encounter: Secondary | ICD-10-CM | POA: Diagnosis not present

## 2022-01-01 DIAGNOSIS — R030 Elevated blood-pressure reading, without diagnosis of hypertension: Secondary | ICD-10-CM | POA: Diagnosis not present

## 2022-01-01 DIAGNOSIS — M542 Cervicalgia: Secondary | ICD-10-CM | POA: Diagnosis not present

## 2022-01-25 ENCOUNTER — Ambulatory Visit: Payer: PPO | Admitting: Podiatry

## 2022-01-25 ENCOUNTER — Ambulatory Visit (INDEPENDENT_AMBULATORY_CARE_PROVIDER_SITE_OTHER): Payer: PPO

## 2022-01-25 ENCOUNTER — Other Ambulatory Visit: Payer: Self-pay

## 2022-01-25 DIAGNOSIS — M79672 Pain in left foot: Secondary | ICD-10-CM | POA: Diagnosis not present

## 2022-01-25 DIAGNOSIS — L84 Corns and callosities: Secondary | ICD-10-CM | POA: Diagnosis not present

## 2022-01-25 DIAGNOSIS — M722 Plantar fascial fibromatosis: Secondary | ICD-10-CM | POA: Diagnosis not present

## 2022-01-25 DIAGNOSIS — M21619 Bunion of unspecified foot: Secondary | ICD-10-CM | POA: Diagnosis not present

## 2022-01-25 MED ORDER — TRIAMCINOLONE ACETONIDE 10 MG/ML IJ SUSP
10.0000 mg | Freq: Once | INTRAMUSCULAR | Status: AC
  Administered 2022-01-25: 10 mg

## 2022-01-25 NOTE — Patient Instructions (Addendum)
You can use urea cream on the calluses  For instructions on how to put on your Plantar Fascial Brace, please visit [email or web address]  Plantar Fasciitis (Heel Spur Syndrome) with Rehab The plantar fascia is a fibrous, ligament-like, soft-tissue structure that spans the bottom of the foot. Plantar fasciitis is a condition that causes pain in the foot due to inflammation of the tissue. SYMPTOMS  Pain and tenderness on the underneath side of the foot. Pain that worsens with standing or walking. CAUSES  Plantar fasciitis is caused by irritation and injury to the plantar fascia on the underneath side of the foot. Common mechanisms of injury include: Direct trauma to bottom of the foot. Damage to a small nerve that runs under the foot where the main fascia attaches to the heel bone. Stress placed on the plantar fascia due to bone spurs. RISK INCREASES WITH:  Activities that place stress on the plantar fascia (running, jumping, pivoting, or cutting). Poor strength and flexibility. Improperly fitted shoes. Tight calf muscles. Flat feet. Failure to warm-up properly before activity. Obesity. PREVENTION Warm up and stretch properly before activity. Allow for adequate recovery between workouts. Maintain physical fitness: Strength, flexibility, and endurance. Cardiovascular fitness. Maintain a health body weight. Avoid stress on the plantar fascia. Wear properly fitted shoes, including arch supports for individuals who have flat feet.  PROGNOSIS  If treated properly, then the symptoms of plantar fasciitis usually resolve without surgery. However, occasionally surgery is necessary.  RELATED COMPLICATIONS  Recurrent symptoms that may result in a chronic condition. Problems of the lower back that are caused by compensating for the injury, such as limping. Pain or weakness of the foot during push-off following surgery. Chronic inflammation, scarring, and partial or complete fascia tear,  occurring more often from repeated injections.  TREATMENT  Treatment initially involves the use of ice and medication to help reduce pain and inflammation. The use of strengthening and stretching exercises may help reduce pain with activity, especially stretches of the Achilles tendon. These exercises may be performed at home or with a therapist. Your caregiver may recommend that you use heel cups of arch supports to help reduce stress on the plantar fascia. Occasionally, corticosteroid injections are given to reduce inflammation. If symptoms persist for greater than 6 months despite non-surgical (conservative), then surgery may be recommended.   MEDICATION  If pain medication is necessary, then nonsteroidal anti-inflammatory medications, such as aspirin and ibuprofen, or other minor pain relievers, such as acetaminophen, are often recommended. Do not take pain medication within 7 days before surgery. Prescription pain relievers may be given if deemed necessary by your caregiver. Use only as directed and only as much as you need. Corticosteroid injections may be given by your caregiver. These injections should be reserved for the most serious cases, because they may only be given a certain number of times.  HEAT AND COLD Cold treatment (icing) relieves pain and reduces inflammation. Cold treatment should be applied for 10 to 15 minutes every 2 to 3 hours for inflammation and pain and immediately after any activity that aggravates your symptoms. Use ice packs or massage the area with a piece of ice (ice massage). Heat treatment may be used prior to performing the stretching and strengthening activities prescribed by your caregiver, physical therapist, or athletic trainer. Use a heat pack or soak the injury in warm water.  SEEK IMMEDIATE MEDICAL CARE IF: Treatment seems to offer no benefit, or the condition worsens. Any medications produce adverse side effects.  EXERCISES- RANGE OF  MOTION (ROM) AND  STRETCHING EXERCISES - Plantar Fasciitis (Heel Spur Syndrome) These exercises may help you when beginning to rehabilitate your injury. Your symptoms may resolve with or without further involvement from your physician, physical therapist or athletic trainer. While completing these exercises, remember:  Restoring tissue flexibility helps normal motion to return to the joints. This allows healthier, less painful movement and activity. An effective stretch should be held for at least 30 seconds. A stretch should never be painful. You should only feel a gentle lengthening or release in the stretched tissue.  RANGE OF MOTION - Toe Extension, Flexion Sit with your right / left leg crossed over your opposite knee. Grasp your toes and gently pull them back toward the top of your foot. You should feel a stretch on the bottom of your toes and/or foot. Hold this stretch for 10 seconds. Now, gently pull your toes toward the bottom of your foot. You should feel a stretch on the top of your toes and or foot. Hold this stretch for 10 seconds. Repeat  times. Complete this stretch 3 times per day.   RANGE OF MOTION - Ankle Dorsiflexion, Active Assisted Remove shoes and sit on a chair that is preferably not on a carpeted surface. Place right / left foot under knee. Extend your opposite leg for support. Keeping your heel down, slide your right / left foot back toward the chair until you feel a stretch at your ankle or calf. If you do not feel a stretch, slide your bottom forward to the edge of the chair, while still keeping your heel down. Hold this stretch for 10 seconds. Repeat 3 times. Complete this stretch 2 times per day.   STRETCH  Gastroc, Standing Place hands on wall. Extend right / left leg, keeping the front knee somewhat bent. Slightly point your toes inward on your back foot. Keeping your right / left heel on the floor and your knee straight, shift your weight toward the wall, not allowing your back  to arch. You should feel a gentle stretch in the right / left calf. Hold this position for 10 seconds. Repeat 3 times. Complete this stretch 2 times per day.  STRETCH  Soleus, Standing Place hands on wall. Extend right / left leg, keeping the other knee somewhat bent. Slightly point your toes inward on your back foot. Keep your right / left heel on the floor, bend your back knee, and slightly shift your weight over the back leg so that you feel a gentle stretch deep in your back calf. Hold this position for 10 seconds. Repeat 3 times. Complete this stretch 2 times per day.  STRETCH  Gastrocsoleus, Standing  Note: This exercise can place a lot of stress on your foot and ankle. Please complete this exercise only if specifically instructed by your caregiver.  Place the ball of your right / left foot on a step, keeping your other foot firmly on the same step. Hold on to the wall or a rail for balance. Slowly lift your other foot, allowing your body weight to press your heel down over the edge of the step. You should feel a stretch in your right / left calf. Hold this position for 10 seconds. Repeat this exercise with a slight bend in your right / left knee. Repeat 3 times. Complete this stretch 2 times per day.   STRENGTHENING EXERCISES - Plantar Fasciitis (Heel Spur Syndrome)  These exercises may help you when beginning to rehabilitate your injury. They may  resolve your symptoms with or without further involvement from your physician, physical therapist or athletic trainer. While completing these exercises, remember:  Muscles can gain both the endurance and the strength needed for everyday activities through controlled exercises. Complete these exercises as instructed by your physician, physical therapist or athletic trainer. Progress the resistance and repetitions only as guided.  STRENGTH - Towel Curls Sit in a chair positioned on a non-carpeted surface. Place your foot on a towel,  keeping your heel on the floor. Pull the towel toward your heel by only curling your toes. Keep your heel on the floor. Repeat 3 times. Complete this exercise 2 times per day.  STRENGTH - Ankle Inversion Secure one end of a rubber exercise band/tubing to a fixed object (table, pole). Loop the other end around your foot just before your toes. Place your fists between your knees. This will focus your strengthening at your ankle. Slowly, pull your big toe up and in, making sure the band/tubing is positioned to resist the entire motion. Hold this position for 10 seconds. Have your muscles resist the band/tubing as it slowly pulls your foot back to the starting position. Repeat 3 times. Complete this exercises 2 times per day.  Document Released: 12/13/2005 Document Revised: 03/06/2012 Document Reviewed: 03/27/2009 [facility] Patient Information 2014 [place], [place].

## 2022-01-27 NOTE — Progress Notes (Signed)
Subjective:   Patient ID: [name], female   DOB: 71 y.o.   MRN: [id number]   HPI 71 year old female presents the office today for concerns of heel pain and left heel.  Pain is worse in the morning when she first gets up gabapentin heel.  This for about 3 months ago.  She tried stretching as well as changing shoes.  No recent injury or changes in activity.  She does have multiple sclerosis and uses a cane.  Also has been getting calluses on both of her big toes.  She is tried soaking, creams as well as, stands without significant relief.  No open lesions.   Review of Systems  All other systems reviewed and are negative.  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 , low grade appendiceal mucinous neoplasm    HNP (herniated nucleus pulposus), cervical 05/17/2019   Hypokalemia 12/04/2019   Mitral valve prolapse    states no problem   Multiple sclerosis 1989   Myocarditis 04/25/2019   Neurogenic bladder    Obstructive chronic bronchitis with exacerbation 09/10/2008   Qualifier: Diagnosis of  By: Joya Gaskins MD, Burnett Harry    Occipital neuralgia 05/06/2015   Osteoporosis    Pneumonia    PONV (postoperative nausea and vomiting)    Pulmonary nodule, right upper lobe 01/22/2009   Short of breath on exertion    Sjogren's disease    dryness of eyes, mouth   Spastic gait    Transverse myelitis    T4   Urinary frequency 02/05/2015   Vitamin D  deficiency 12/09/2016   Vocal cord disorder 10/21/2009   Wears bilateral ankle braces    AFO braces for stability    Past Surgical History:  Procedure Laterality Date   ANAL RECTAL MANOMETRY N/A 08/18/2016   Procedure: ANO RECTAL MANOMETRY;  Surgeon: Leighton Ruff, MD;  Location: [facility];  Service: Endoscopy;  Laterality: N/A;   ANTERIOR CERVICAL DECOMP/DISCECTOMY FUSION  2004;   2003   C4 -- C5 (2004)/   C3 -- C4 (2003)   ANTERIOR CERVICAL DECOMP/DISCECTOMY FUSION N/A 05/17/2019   Procedure: EVACUATION HEMATOMA OF POST OP ANTERIOR CERVICAL DECOMPRESSION FUSION;  Surgeon: Jovita Gamma, MD;  Location: [place];  Service: Neurosurgery;  Laterality: N/A;   ANTERIOR CERVICAL DECOMP/DISCECTOMY FUSION N/A 05/17/2019   Procedure: REMOVAL OF TETHER CERVICAL PLATE, ANTERIOR CERVICAL DECOMPRESSION/DISCECTOMY FUSION CERVICAL FIVE- CERVICAL SIX, CERVICAL SIX- CERVICAL SEVEN;  Surgeon: Jovita Gamma, MD;  Location: [place];  Service: Neurosurgery;  Laterality: N/A;   APPENDECTOMY  06/22/2011   laparoscopic   BILATERAL SALPINGOOPHORECTOMY  1982   BLADDER SUSPENSION  1994   sling   BREAST ENHANCEMENT SURGERY Bilateral    BREAST IMPLANT REMOVAL Bilateral 12/03/2008   BUNIONECTOMY     CATARACT EXTRACTION W/ INTRAOCULAR LENS  IMPLANT, BILATERAL  2015  ELBOW SURGERY Right    HEMORROIDECTOMY  08/05/2010;  2013   INSERTION SACRAL NERVE STIMULATOR TEST WIRE  09-28-2016   dr Garner Nash NERVE BLOCK  2015   occipital   MENISCUS REPAIR Left 09/26/2018   RECTAL ULTRASOUND N/A 08/18/2016   Procedure: RECTAL ULTRASOUND;  Surgeon: Leighton Ruff, MD;  Location: [facility];  Service: Endoscopy;  Laterality: N/A;   SHOULDER ARTHROSCOPY Left    SHOULDER ARTHROSCOPY WITH DISTAL CLAVICLE RESECTION Right 09/21/2007   w/  Acrominoplasty,  Debridement Rotator Cuff,  CA ligament release   TONSILLECTOMY  age 54   TRIGGER FINGER RELEASE Left 01/16/2015   Procedure: LEFT THUMB TRIGGER RELEASE ;  Surgeon:  Leanora Cover, MD;  Location: [place];  Service: Orthopedics;  Laterality: Left;   VAGINAL HYSTERECTOMY  1981     Current Outpatient Medications:    Biotin 5000 MCG CAPS, Take 1 capsule by mouth daily., Disp: , Rfl:    calcium carbonate (OS-CAL) 600 MG TABS tablet, Take 1,200 mg by mouth. , Disp: , Rfl:    denosumab (PROLIA) 60 MG/ML SOLN injection, Inject 60 mg into the skin every 6 (six) months. Administer in upper arm, thigh, or abdomen, Disp: , Rfl:    Dexlansoprazole 30 MG capsule DR, Take 30 mg by mouth daily., Disp: , Rfl:    donepezil (ARICEPT) 10 MG tablet, TAKE 1 TABLET BY MOUTH EVERYDAY AT BEDTIME, Disp: 90 tablet, Rfl: 4   estradiol (ESTRACE) 0.1 MG/GM vaginal cream, Place vaginally., Disp: , Rfl:    furosemide (LASIX) 20 MG tablet, Take 1 tablet by mouth as needed for edema., Disp: , Rfl:    HYDROcodone-acetaminophen (NORCO) 5-325 MG tablet, Take 1 tablet by mouth every 6 (six) hours as needed for moderate pain., Disp: 30 tablet, Rfl: 0   HYDROcodone-acetaminophen (NORCO/VICODIN) 5-325 MG tablet, Take 1 tablet by mouth every 6 hours as needed for moderate pain, Disp: 30 tablet, Rfl: 0   ibuprofen (ADVIL) 600 MG tablet, Take 1 tablet (600 mg total) by mouth every 6 (six) hours as needed., Disp: 30 tablet, Rfl: 0   lamoTRIgine (LAMICTAL) 200 MG tablet, Take 1 tablet (200 mg total) by mouth 2 (two) times daily., Disp: 180 tablet, Rfl: 3   loratadine (CLARITIN) 10 MG tablet, Take 10 mg by mouth daily., Disp: , Rfl:    methylphenidate (RITALIN) 10 MG tablet, Take 1 tablet (10 mg total) by mouth 2 (two) times daily., Disp: 60 tablet, Rfl: 0   Multiple Vitamins-Minerals (MULTIVITAMIN PO), Take 1 tablet by mouth daily. Brand name - Mature Multivitamin from [employer], Disp: , Rfl:    Probiotic Product ([place]), Take 1 tablet by mouth every morning., Disp: , Rfl:    rOPINIRole (REQUIP) 0.25 MG tablet, TAKE 1 TO 2 TABLETS BY MOUTH AT NIGHT, Disp: 180 tablet,  Rfl: 0   rosuvastatin (CRESTOR) 5 MG tablet, Take 5 mg by mouth at bedtime., Disp: , Rfl:    vitamin B-12 (CYANOCOBALAMIN) 1000 MCG tablet, Take 1,000 mcg by mouth daily., Disp: , Rfl:    VITAMIN D PO, Take 2,000 Units by mouth daily., Disp: , Rfl:   Allergies  Allergen Reactions   Other Shortness Of Breath    PURPLE LETTUCE   Betaseron [Interferon Beta-1b] Other (See Comments)    HARD NODULAR AREAS AT INJECTION SITE   Codeine Nausea And Vomiting   Gilenya [Fingolimod Hydrochloride] Other (See Comments)    MACULAR EDEMA   Morphine And Related Other (  See Comments)    IV ROUTE - RED STREAKS OF VEIN   Sumatriptan Nausea Only    INJECTABLE ONLY - RAPID HEART RATE, FLUSHING   Tysabri [Natalizumab] Other (See Comments)    DEVELOPED JCV ANTIBODY   Latex Rash         Objective:  Physical Exam  General: AAO x3, NAD  Dermatological: Hyperkeratotic lesions medial hallux, MPJ bilaterally.  No underlying ulceration drainage or any signs of infection.  No open lesions.  Vascular: Dorsalis Pedis artery and Posterior Tibial artery pedal pulses are 2/4 bilateral with immedate capillary fill time. There is no pain with calf compression, swelling, warmth, erythema.   Neruologic: Grossly intact via light touch bilateral.   Musculoskeletal: Tenderness to palpation along the plantar medial tubercle of the calcaneus at the insertion of plantar fascia on the left foot. There is no pain along the course of the plantar fascia within the arch of the foot. Plantar fascia appears to be intact. There is no pain with lateral compression of the calcaneus or pain with vibratory sensation. There is no pain along the course or insertion of the achilles tendon. No other areas of tenderness to bilateral lower extremities. Muscular strength 5/5 in all groups tested bilateral.  Gait: Unassisted, Nonantalgic.       Assessment:   Left heel pain, Plantar fasciitis; hyperkeratotic lesions bilaterally     Plan:   -Treatment options discussed including all alternatives, risks, and complications -Etiology of symptoms were discussed -X-rays were obtained and reviewed with the patient.  Bunions present.  No evidence of acute fracture noted. -Regards to heel pain we discussed stretching, icing daily.  We discussed shoes and arch support.  Given injection was offered and she wishes to proceed with this today.  See procedure note below. -Debrided calluses without any complications or bleeding as a courtesy  Procedure: Injection Tendon/Ligament Discussed alternatives, risks, complications and verbal consent was obtained.  Location: Left plantar fascia at the glabrous junction; medial approach. Skin Prep: Alcohol. Injectate: 0.5cc 0.5% marcaine plain, 0.5 cc 2% lidocaine plain and, 1 cc kenalog 10. Disposition: Patient tolerated procedure well. Injection site dressed with a band-aid.  Post-injection care was discussed and return precautions discussed.   .follow  Trula Slade DPM

## 2022-02-01 ENCOUNTER — Other Ambulatory Visit: Payer: Self-pay | Admitting: Neurology

## 2022-02-01 MED ORDER — METHYLPHENIDATE HCL 10 MG PO TABS: 10.0000 mg | ORAL_TABLET | Freq: Two times a day (BID) | ORAL | 0 refills | Status: DC

## 2022-02-01 NOTE — Telephone Encounter (Signed)
Pt is requesting a refill for methylphenidate (RITALIN) 10 MG tablet .  Pharmacy: [place] [phone]

## 2022-02-16 ENCOUNTER — Other Ambulatory Visit: Payer: Self-pay | Admitting: Neurology

## 2022-02-16 DIAGNOSIS — Z1231 Encounter for screening mammogram for malignant neoplasm of breast: Secondary | ICD-10-CM | POA: Diagnosis not present

## 2022-02-20 ENCOUNTER — Other Ambulatory Visit: Payer: Self-pay | Admitting: Neurology

## 2022-02-22 ENCOUNTER — Other Ambulatory Visit: Payer: Self-pay

## 2022-02-22 ENCOUNTER — Ambulatory Visit: Payer: PPO | Admitting: Podiatry

## 2022-02-22 DIAGNOSIS — M79672 Pain in left foot: Secondary | ICD-10-CM

## 2022-02-22 DIAGNOSIS — M722 Plantar fascial fibromatosis: Secondary | ICD-10-CM | POA: Diagnosis not present

## 2022-02-22 MED ORDER — TRIAMCINOLONE ACETONIDE 10 MG/ML IJ SUSP
10.0000 mg | Freq: Once | INTRAMUSCULAR | Status: AC
  Administered 2022-02-22: 10 mg

## 2022-02-22 NOTE — Patient Instructions (Signed)

## 2022-02-23 DIAGNOSIS — R051 Acute cough: Secondary | ICD-10-CM | POA: Diagnosis not present

## 2022-02-23 DIAGNOSIS — J069 Acute upper respiratory infection, unspecified: Secondary | ICD-10-CM | POA: Diagnosis not present

## 2022-02-28 NOTE — Progress Notes (Signed)
Subjective: 71 year old female presents the office today for follow-up evaluation of left heel pain.  She said that it is still very tender.  She was just back from vacation as well.  She states that she is on her feet a lot.  She has been stretching, icing as well as using plantar fascial brace.  The injection did help but only for short amount of time.  No injuries or concerns otherwise.  Objective: AAO x3, NAD DP/PT pulses palpable bilaterally, CRT less than 3 seconds There is continuation tenderness palpation along the plantar medial tubercle of the calcaneus at the insertion of plantar fascia on the left foot.  There is no pain with lateral compression of calcaneus.  Negative Tinel sign.  Flexor, extensor tendons appear to be intact.  No edema, erythema.  No pain with calf compression, swelling, warmth, erythema  Assessment: Left continued heel pain, Planter fasciitis  Plan: -All treatment options discussed with the patient including all alternatives, risks, complications.  -Second steroid injection performed.  See procedure note below.  Continue plantar fascial brace.  Dispensed night splint.  Continue stretching, icing daily.  If no improvement referral to physical therapy. -Patient encouraged to call the office with any questions, concerns, change in symptoms.   Procedure: Injection Tendon/Ligament Discussed alternatives, risks, complications and verbal consent was obtained.  Location: Left plantar fascia at the glabrous junction; medial approach. Skin Prep: Alcohol. Injectate: 0.5cc 0.5% marcaine plain, 0.5 cc 2% lidocaine plain and, 1 cc kenalog 10. Disposition: Patient tolerated procedure well. Injection site dressed with a band-aid.  Post-injection care was discussed and return precautions discussed.   Return in about 4 weeks (around 03/22/2022), or if symptoms worsen or fail to improve.  Trula Slade DPM

## 2022-03-09 ENCOUNTER — Ambulatory Visit (INDEPENDENT_AMBULATORY_CARE_PROVIDER_SITE_OTHER): Payer: PPO

## 2022-03-09 ENCOUNTER — Other Ambulatory Visit: Payer: Self-pay

## 2022-03-09 ENCOUNTER — Ambulatory Visit: Payer: PPO | Admitting: Podiatry

## 2022-03-09 ENCOUNTER — Ambulatory Visit: Payer: PPO

## 2022-03-09 DIAGNOSIS — M722 Plantar fascial fibromatosis: Secondary | ICD-10-CM | POA: Diagnosis not present

## 2022-03-09 DIAGNOSIS — M21619 Bunion of unspecified foot: Secondary | ICD-10-CM

## 2022-03-09 DIAGNOSIS — L84 Corns and callosities: Secondary | ICD-10-CM

## 2022-03-09 DIAGNOSIS — M7672 Peroneal tendinitis, left leg: Secondary | ICD-10-CM | POA: Diagnosis not present

## 2022-03-09 DIAGNOSIS — M79672 Pain in left foot: Secondary | ICD-10-CM

## 2022-03-09 NOTE — Progress Notes (Signed)
SITUATION ?Reason for Consult: Evaluation for Bilateral Custom Foot Orthoses ?Patient / Caregiver Report: Patient is ready for foot orthotics ? ?OBJECTIVE DATA: ?Patient History / Diagnosis:  ?  ICD-10-CM   ?1. Plantar fasciitis, left  M72.2   ?  ?2. Plantar fasciitis  M72.2   ?  ?3. Pain of left heel  M79.672   ?  ?4. Bunion  M21.619   ?  ?5. Callus  L84   ?  ? ? ?Current or Previous Devices: None and no history ? ?Foot Examination: ?Skin presentation:   Intact ?Ulcers & Callousing:   None and no history ?Toe / Foot Deformities:  Bunion ?Weight Bearing Presentation:  Rectus ?Sensation:    Intact ? ?Shoe Size: 9.20M ? ?ORTHOTIC RECOMMENDATION ?Recommended Device: 1x pair of custom functional foot orthotics ? ?GOALS OF ORTHOSES ?- Reduce Pain ?- Prevent Foot Deformity ?- Prevent Progression of Further Foot Deformity ?- Relieve Pressure ?- Improve the Overall Biomechanical Function of the Foot and Lower Extremity. ? ?ACTIONS PERFORMED ?Patient was casted for Foot Orthoses via crush box. Procedure was explained and patient tolerated procedure well. All questions were answered and concerns addressed. ? ?PLAN ?Potential out of pocket cost was communicated to patient. Casts are to be sent to [facility] for fabrication. Patient is to be called for fitting when devices are ready.  ? ? ?

## 2022-03-11 NOTE — Progress Notes (Signed)
Subjective: ?71 year old female presents the office today for follow-up evaluation of left heel pain.  She said the pain has been getting worse.  She has pain in the plantar heel which is also point the lateral aspect of the foot, ankle she does get discomfort as well.  She states that she is having difficulty putting weight on her foot.  Injections have not been helpful.  No recent injuries or falls.  She does have altered gait given her multiple sclerosis.  ? ?Objective: ?AAO x3, NAD ?DP/PT pulses palpable bilaterally, CRT less than 3 seconds ?There is continuation of tenderness palpation along the plantar medial tubercle of the calcaneus at the insertion of plantar fascia on the left foot.  There is no pain with lateral compression of calcaneus.  Negative Tinel sign.  Today there is also tenderness in the course the peroneal tendon posterior and inferior to the lateral malleolus.  There is mild edema present over this area.  Flexor, extensor tendons appear to be intact.  No edema, erythema.  No pain with calf compression, swelling, warmth, erythema ? ?Assessment: ?Left continued heel pain, Plantar fasciitis, peroneal tendinitis ? ?Plan: ?-All treatment options discussed with the patient including all alternatives, risks, complications.  ?-Repeat x-rays obtained reviewed.  No subacute fracture or stress fracture noted.  Chronic changes present to the distal fibula. ?-At this point given her ongoing nature of symptoms I ordered MRI.  Recommend elicitation in cam boot for now as well to get the MRI.  Limit activity. ?-She was measured for orthotics today as well long-term use. ?-Discussed other treatment options however will await the results of the MRI report proceed with further treatment. ? ?Trula Slade DPM ? ?

## 2022-03-22 ENCOUNTER — Ambulatory Visit
Admission: RE | Admit: 2022-03-22 | Discharge: 2022-03-22 | Disposition: A | Discharge status: Home / Self Care | Payer: PPO | Source: Ambulatory Visit | Attending: Podiatry | Admitting: Podiatry

## 2022-03-22 DIAGNOSIS — M722 Plantar fascial fibromatosis: Secondary | ICD-10-CM

## 2022-03-22 DIAGNOSIS — S86312A Strain of muscle(s) and tendon(s) of peroneal muscle group at lower leg level, left leg, initial encounter: Secondary | ICD-10-CM | POA: Diagnosis not present

## 2022-03-22 DIAGNOSIS — S93492A Sprain of other ligament of left ankle, initial encounter: Secondary | ICD-10-CM | POA: Diagnosis not present

## 2022-03-23 ENCOUNTER — Telehealth: Payer: Self-pay | Admitting: *Deleted

## 2022-03-23 NOTE — Telephone Encounter (Signed)
[facility] is calling to clarify how many boots dispensed for notes.  ? Returned the call to explained that only one foot (left)was treated and one boot dispensed according [facility] notes. ?

## 2022-03-24 ENCOUNTER — Other Ambulatory Visit: Payer: Self-pay | Admitting: Neurology

## 2022-03-24 MED ORDER — METHYLPHENIDATE HCL 10 MG PO TABS: 10.0000 mg | ORAL_TABLET | Freq: Two times a day (BID) | ORAL | 0 refills | Status: DC

## 2022-03-24 NOTE — Telephone Encounter (Signed)
I have routed this request to Dr Sater for review. The pt is due for the medication and [place] registry was verified.  

## 2022-03-24 NOTE — Telephone Encounter (Signed)
Pt called needing a refill request for her methylphenidate (RITALIN) 10 MG tablet sent to the [facility] on [address].  ?

## 2022-03-26 ENCOUNTER — Telehealth: Payer: Self-pay | Admitting: Neurology

## 2022-03-26 DIAGNOSIS — M7672 Peroneal tendinitis, left leg: Secondary | ICD-10-CM | POA: Diagnosis not present

## 2022-03-26 DIAGNOSIS — M25672 Stiffness of left ankle, not elsewhere classified: Secondary | ICD-10-CM | POA: Diagnosis not present

## 2022-03-26 DIAGNOSIS — M25572 Pain in left ankle and joints of left foot: Secondary | ICD-10-CM | POA: Diagnosis not present

## 2022-03-26 DIAGNOSIS — M722 Plantar fascial fibromatosis: Secondary | ICD-10-CM | POA: Diagnosis not present

## 2022-03-26 NOTE — Telephone Encounter (Signed)
Pt is needing a refill request for her methylphenidate (RITALIN) 10 MG tablet sent to the [facility] on [address].  ?

## 2022-03-26 NOTE — Telephone Encounter (Signed)
Rx already sent to pharmacy 03/24/22 ?

## 2022-03-30 ENCOUNTER — Ambulatory Visit (INDEPENDENT_AMBULATORY_CARE_PROVIDER_SITE_OTHER): Payer: PPO

## 2022-03-30 DIAGNOSIS — M7672 Peroneal tendinitis, left leg: Secondary | ICD-10-CM | POA: Diagnosis not present

## 2022-03-30 DIAGNOSIS — M21619 Bunion of unspecified foot: Secondary | ICD-10-CM

## 2022-03-30 DIAGNOSIS — M722 Plantar fascial fibromatosis: Secondary | ICD-10-CM

## 2022-03-30 NOTE — Progress Notes (Signed)
SITUATION: ?Reason for Visit: Fitting and Delivery of Custom Fabricated Foot Orthoses ?Patient Report: Patient reports comfort and is satisfied with device. ? ?OBJECTIVE DATA: ?Patient History / Diagnosis:   ?  ICD-10-CM   ?1. Plantar fasciitis  M72.2   ?  ?2. Bunion  M21.619   ?  ?3. Peroneal tendonitis, left  M76.72   ?  ? ? ?Provided Device:  Custom Functional Foot Orthotics ?    [employer]: [id number] ? ?GOAL OF ORTHOSIS ?- Improve gait ?- Decrease energy expenditure ?- Improve Balance ?- Provide Triplanar stability of foot complex ?- Facilitate motion ? ?ACTIONS PERFORMED ?Patient was fit with foot orthotics trimmed to shoe last. Patient tolerated fittign procedure.  ? ?Patient was provided with verbal and written instruction and demonstration regarding donning, doffing, wear, care, proper fit, function, purpose, cleaning, and use of the orthosis and in all related precautions and risks and benefits regarding the orthosis. ? ?Patient was also provided with verbal instruction regarding how to report any failures or malfunctions of the orthosis and necessary follow up care. Patient was also instructed to contact our office regarding any change in status that may affect the function of the orthosis. ? ?Patient demonstrated independence with proper donning, doffing, and fit and verbalized understanding of all instructions. ? ?PLAN: ?Patient is to follow up in one week or as necessary (PRN). All questions were answered and concerns addressed. Plan of care was discussed with and agreed upon by the patient. ? ?

## 2022-03-31 ENCOUNTER — Telehealth: Payer: Self-pay | Admitting: Neurology

## 2022-03-31 ENCOUNTER — Other Ambulatory Visit: Payer: Self-pay | Admitting: Neurology

## 2022-03-31 DIAGNOSIS — M25572 Pain in left ankle and joints of left foot: Secondary | ICD-10-CM | POA: Diagnosis not present

## 2022-03-31 DIAGNOSIS — M25672 Stiffness of left ankle, not elsewhere classified: Secondary | ICD-10-CM | POA: Diagnosis not present

## 2022-03-31 DIAGNOSIS — L821 Other seborrheic keratosis: Secondary | ICD-10-CM | POA: Diagnosis not present

## 2022-03-31 DIAGNOSIS — Z85828 Personal history of other malignant neoplasm of skin: Secondary | ICD-10-CM | POA: Diagnosis not present

## 2022-03-31 DIAGNOSIS — M722 Plantar fascial fibromatosis: Secondary | ICD-10-CM | POA: Diagnosis not present

## 2022-03-31 DIAGNOSIS — L853 Xerosis cutis: Secondary | ICD-10-CM | POA: Diagnosis not present

## 2022-03-31 DIAGNOSIS — L820 Inflamed seborrheic keratosis: Secondary | ICD-10-CM | POA: Diagnosis not present

## 2022-03-31 DIAGNOSIS — M7672 Peroneal tendinitis, left leg: Secondary | ICD-10-CM | POA: Diagnosis not present

## 2022-03-31 MED ORDER — METHYLPHENIDATE HCL 10 MG PO TABS: 10.0000 mg | ORAL_TABLET | Freq: Two times a day (BID) | ORAL | 0 refills | Status: DC

## 2022-03-31 NOTE — Telephone Encounter (Signed)
Pt said, [facility] does not have methylphenidate (RITALIN) 10 MG tablet in stock,will have it April 14. [facility] does have in stock. ? ?Can you send prescription to  ?[facility] ?[place], [place]  ?Phone: [phone] ?

## 2022-03-31 NOTE — Telephone Encounter (Signed)
Medication refill has been forwarded to the MD to send to [facility] for the pt as requested ?

## 2022-04-05 DIAGNOSIS — M722 Plantar fascial fibromatosis: Secondary | ICD-10-CM | POA: Diagnosis not present

## 2022-04-05 DIAGNOSIS — M25572 Pain in left ankle and joints of left foot: Secondary | ICD-10-CM | POA: Diagnosis not present

## 2022-04-05 DIAGNOSIS — M7672 Peroneal tendinitis, left leg: Secondary | ICD-10-CM | POA: Diagnosis not present

## 2022-04-05 DIAGNOSIS — M25672 Stiffness of left ankle, not elsewhere classified: Secondary | ICD-10-CM | POA: Diagnosis not present

## 2022-04-07 DIAGNOSIS — M7672 Peroneal tendinitis, left leg: Secondary | ICD-10-CM | POA: Diagnosis not present

## 2022-04-07 DIAGNOSIS — M25572 Pain in left ankle and joints of left foot: Secondary | ICD-10-CM | POA: Diagnosis not present

## 2022-04-07 DIAGNOSIS — M722 Plantar fascial fibromatosis: Secondary | ICD-10-CM | POA: Diagnosis not present

## 2022-04-07 DIAGNOSIS — M25672 Stiffness of left ankle, not elsewhere classified: Secondary | ICD-10-CM | POA: Diagnosis not present

## 2022-04-12 DIAGNOSIS — I700 Atherosclerosis of aorta: Secondary | ICD-10-CM | POA: Diagnosis not present

## 2022-04-12 DIAGNOSIS — K219 Gastro-esophageal reflux disease without esophagitis: Secondary | ICD-10-CM | POA: Diagnosis not present

## 2022-04-12 DIAGNOSIS — E538 Deficiency of other specified B group vitamins: Secondary | ICD-10-CM | POA: Diagnosis not present

## 2022-04-12 DIAGNOSIS — M810 Age-related osteoporosis without current pathological fracture: Secondary | ICD-10-CM | POA: Diagnosis not present

## 2022-04-12 DIAGNOSIS — G609 Hereditary and idiopathic neuropathy, unspecified: Secondary | ICD-10-CM | POA: Diagnosis not present

## 2022-04-12 DIAGNOSIS — J45998 Other asthma: Secondary | ICD-10-CM | POA: Diagnosis not present

## 2022-04-12 DIAGNOSIS — G35 Multiple sclerosis: Secondary | ICD-10-CM | POA: Diagnosis not present

## 2022-04-12 DIAGNOSIS — I10 Essential (primary) hypertension: Secondary | ICD-10-CM | POA: Diagnosis not present

## 2022-04-12 DIAGNOSIS — R2689 Other abnormalities of gait and mobility: Secondary | ICD-10-CM | POA: Diagnosis not present

## 2022-04-12 DIAGNOSIS — R911 Solitary pulmonary nodule: Secondary | ICD-10-CM | POA: Diagnosis not present

## 2022-04-12 DIAGNOSIS — Z23 Encounter for immunization: Secondary | ICD-10-CM | POA: Diagnosis not present

## 2022-04-12 DIAGNOSIS — E785 Hyperlipidemia, unspecified: Secondary | ICD-10-CM | POA: Diagnosis not present

## 2022-04-12 DIAGNOSIS — N1831 Chronic kidney disease, stage 3a: Secondary | ICD-10-CM | POA: Diagnosis not present

## 2022-04-13 DIAGNOSIS — M7672 Peroneal tendinitis, left leg: Secondary | ICD-10-CM | POA: Diagnosis not present

## 2022-04-13 DIAGNOSIS — M25672 Stiffness of left ankle, not elsewhere classified: Secondary | ICD-10-CM | POA: Diagnosis not present

## 2022-04-13 DIAGNOSIS — M722 Plantar fascial fibromatosis: Secondary | ICD-10-CM | POA: Diagnosis not present

## 2022-04-13 DIAGNOSIS — M25572 Pain in left ankle and joints of left foot: Secondary | ICD-10-CM | POA: Diagnosis not present

## 2022-04-14 DIAGNOSIS — M7672 Peroneal tendinitis, left leg: Secondary | ICD-10-CM | POA: Diagnosis not present

## 2022-04-14 DIAGNOSIS — M722 Plantar fascial fibromatosis: Secondary | ICD-10-CM | POA: Diagnosis not present

## 2022-04-14 DIAGNOSIS — M25572 Pain in left ankle and joints of left foot: Secondary | ICD-10-CM | POA: Diagnosis not present

## 2022-04-14 DIAGNOSIS — M25672 Stiffness of left ankle, not elsewhere classified: Secondary | ICD-10-CM | POA: Diagnosis not present

## 2022-04-16 ENCOUNTER — Other Ambulatory Visit: Payer: Self-pay | Admitting: Internal Medicine

## 2022-04-16 DIAGNOSIS — R911 Solitary pulmonary nodule: Secondary | ICD-10-CM

## 2022-04-19 DIAGNOSIS — M25672 Stiffness of left ankle, not elsewhere classified: Secondary | ICD-10-CM | POA: Diagnosis not present

## 2022-04-19 DIAGNOSIS — M722 Plantar fascial fibromatosis: Secondary | ICD-10-CM | POA: Diagnosis not present

## 2022-04-19 DIAGNOSIS — M7672 Peroneal tendinitis, left leg: Secondary | ICD-10-CM | POA: Diagnosis not present

## 2022-04-19 DIAGNOSIS — M25572 Pain in left ankle and joints of left foot: Secondary | ICD-10-CM | POA: Diagnosis not present

## 2022-04-20 ENCOUNTER — Ambulatory Visit
Admission: RE | Admit: 2022-04-20 | Discharge: 2022-04-20 | Disposition: A | Discharge status: Home / Self Care | Payer: PPO | Source: Ambulatory Visit | Attending: Internal Medicine | Admitting: Internal Medicine

## 2022-04-20 DIAGNOSIS — R911 Solitary pulmonary nodule: Secondary | ICD-10-CM | POA: Diagnosis not present

## 2022-04-21 DIAGNOSIS — M25572 Pain in left ankle and joints of left foot: Secondary | ICD-10-CM | POA: Diagnosis not present

## 2022-04-21 DIAGNOSIS — M7672 Peroneal tendinitis, left leg: Secondary | ICD-10-CM | POA: Diagnosis not present

## 2022-04-21 DIAGNOSIS — M722 Plantar fascial fibromatosis: Secondary | ICD-10-CM | POA: Diagnosis not present

## 2022-04-21 DIAGNOSIS — M25672 Stiffness of left ankle, not elsewhere classified: Secondary | ICD-10-CM | POA: Diagnosis not present

## 2022-04-27 ENCOUNTER — Other Ambulatory Visit (HOSPITAL_COMMUNITY): Payer: Self-pay | Admitting: *Deleted

## 2022-04-28 ENCOUNTER — Ambulatory Visit (HOSPITAL_COMMUNITY)
Admission: RE | Admit: 2022-04-28 | Discharge: 2022-04-28 | Disposition: A | Discharge status: Home / Self Care | Payer: PPO | Source: Ambulatory Visit | Attending: Internal Medicine | Admitting: Internal Medicine

## 2022-04-28 DIAGNOSIS — M25572 Pain in left ankle and joints of left foot: Secondary | ICD-10-CM | POA: Diagnosis not present

## 2022-04-28 DIAGNOSIS — M810 Age-related osteoporosis without current pathological fracture: Secondary | ICD-10-CM | POA: Insufficient documentation

## 2022-04-28 DIAGNOSIS — M722 Plantar fascial fibromatosis: Secondary | ICD-10-CM | POA: Diagnosis not present

## 2022-04-28 DIAGNOSIS — M25672 Stiffness of left ankle, not elsewhere classified: Secondary | ICD-10-CM | POA: Diagnosis not present

## 2022-04-28 DIAGNOSIS — M7672 Peroneal tendinitis, left leg: Secondary | ICD-10-CM | POA: Diagnosis not present

## 2022-04-28 MED ORDER — DENOSUMAB 60 MG/ML ~~LOC~~ SOSY
PREFILLED_SYRINGE | SUBCUTANEOUS | Status: AC
  Filled 2022-04-28: qty 1

## 2022-04-28 MED ORDER — DENOSUMAB 60 MG/ML ~~LOC~~ SOSY: 60.0000 mg | PREFILLED_SYRINGE | Freq: Once | SUBCUTANEOUS | Status: DC

## 2022-04-30 ENCOUNTER — Ambulatory Visit: Payer: PPO | Admitting: Podiatry

## 2022-04-30 DIAGNOSIS — M722 Plantar fascial fibromatosis: Secondary | ICD-10-CM

## 2022-04-30 DIAGNOSIS — M7672 Peroneal tendinitis, left leg: Secondary | ICD-10-CM

## 2022-05-04 NOTE — Progress Notes (Signed)
Subjective: ?71 year old female presents the office today for follow-up evaluation of left heel pain.  She has started physical therapy and all this has been helping some she still having quite a bit of discomfort in the bottom of the heel as well as in the ankle.  ? ?She does have altered gait given her multiple sclerosis.  ? ?Objective: ?AAO x3, NAD ?DP/PT pulses palpable bilaterally, CRT less than 3 seconds ?There is continuation of tenderness palpation along the plantar medial tubercle of the calcaneus at the insertion of plantar fascia on the left foot.  There is no pain with lateral compression of calcaneus.  Negative Tinel sign.  There is also tenderness in the course the peroneal tendon posterior and inferior to the lateral malleolus.  There is mild edema present over this area.  Flexor, extensor tendons appear to be intact.  No edema, erythema.  No pain with calf compression, swelling, warmth, erythema ? ?Assessment: ?Left continued heel pain, Plantar fasciitis, peroneal tendinitis ? ?Plan: ?-All treatment options discussed with the patient including all alternatives, risks, complications.  ?-At this point given her ongoing symptoms oriented to a couple treatments of EPAT (at no charge).  Tri-Lock ankle brace was dispensed to help aid in support and stability..  Continue stretching, rehab.  Continue physical therapy.  Also given information for [employer], neuromuscular massage. ? ?Trula Slade DPM ? ?

## 2022-05-07 DIAGNOSIS — Z961 Presence of intraocular lens: Secondary | ICD-10-CM | POA: Diagnosis not present

## 2022-05-07 DIAGNOSIS — H04123 Dry eye syndrome of bilateral lacrimal glands: Secondary | ICD-10-CM | POA: Diagnosis not present

## 2022-05-07 DIAGNOSIS — H52203 Unspecified astigmatism, bilateral: Secondary | ICD-10-CM | POA: Diagnosis not present

## 2022-05-07 DIAGNOSIS — H26491 Other secondary cataract, right eye: Secondary | ICD-10-CM | POA: Diagnosis not present

## 2022-05-12 DIAGNOSIS — M722 Plantar fascial fibromatosis: Secondary | ICD-10-CM | POA: Diagnosis not present

## 2022-05-12 DIAGNOSIS — M25672 Stiffness of left ankle, not elsewhere classified: Secondary | ICD-10-CM | POA: Diagnosis not present

## 2022-05-12 DIAGNOSIS — M25572 Pain in left ankle and joints of left foot: Secondary | ICD-10-CM | POA: Diagnosis not present

## 2022-05-12 DIAGNOSIS — M7672 Peroneal tendinitis, left leg: Secondary | ICD-10-CM | POA: Diagnosis not present

## 2022-05-14 DIAGNOSIS — M722 Plantar fascial fibromatosis: Secondary | ICD-10-CM | POA: Diagnosis not present

## 2022-05-14 DIAGNOSIS — M25672 Stiffness of left ankle, not elsewhere classified: Secondary | ICD-10-CM | POA: Diagnosis not present

## 2022-05-14 DIAGNOSIS — M25572 Pain in left ankle and joints of left foot: Secondary | ICD-10-CM | POA: Diagnosis not present

## 2022-05-14 DIAGNOSIS — M7672 Peroneal tendinitis, left leg: Secondary | ICD-10-CM | POA: Diagnosis not present

## 2022-05-17 ENCOUNTER — Other Ambulatory Visit: Payer: Self-pay | Admitting: Neurology

## 2022-05-17 DIAGNOSIS — M25672 Stiffness of left ankle, not elsewhere classified: Secondary | ICD-10-CM | POA: Diagnosis not present

## 2022-05-17 DIAGNOSIS — M722 Plantar fascial fibromatosis: Secondary | ICD-10-CM | POA: Diagnosis not present

## 2022-05-17 DIAGNOSIS — M7672 Peroneal tendinitis, left leg: Secondary | ICD-10-CM | POA: Diagnosis not present

## 2022-05-17 DIAGNOSIS — M25572 Pain in left ankle and joints of left foot: Secondary | ICD-10-CM | POA: Diagnosis not present

## 2022-05-17 MED ORDER — METHYLPHENIDATE HCL 10 MG PO TABS: 10.0000 mg | ORAL_TABLET | Freq: Two times a day (BID) | ORAL | 0 refills | Status: DC

## 2022-05-17 NOTE — Telephone Encounter (Signed)
Pt is requesting a refill for methylphenidate (RITALIN) 10 MG .  Pharmacy: [place] [phone]

## 2022-05-19 DIAGNOSIS — M7672 Peroneal tendinitis, left leg: Secondary | ICD-10-CM | POA: Diagnosis not present

## 2022-05-19 DIAGNOSIS — M722 Plantar fascial fibromatosis: Secondary | ICD-10-CM | POA: Diagnosis not present

## 2022-05-19 DIAGNOSIS — M25672 Stiffness of left ankle, not elsewhere classified: Secondary | ICD-10-CM | POA: Diagnosis not present

## 2022-05-19 DIAGNOSIS — M25572 Pain in left ankle and joints of left foot: Secondary | ICD-10-CM | POA: Diagnosis not present

## 2022-05-25 DIAGNOSIS — M722 Plantar fascial fibromatosis: Secondary | ICD-10-CM | POA: Diagnosis not present

## 2022-05-25 DIAGNOSIS — M25672 Stiffness of left ankle, not elsewhere classified: Secondary | ICD-10-CM | POA: Diagnosis not present

## 2022-05-25 DIAGNOSIS — Z23 Encounter for immunization: Secondary | ICD-10-CM | POA: Diagnosis not present

## 2022-05-25 DIAGNOSIS — M7672 Peroneal tendinitis, left leg: Secondary | ICD-10-CM | POA: Diagnosis not present

## 2022-05-25 DIAGNOSIS — M25572 Pain in left ankle and joints of left foot: Secondary | ICD-10-CM | POA: Diagnosis not present

## 2022-05-27 DIAGNOSIS — M722 Plantar fascial fibromatosis: Secondary | ICD-10-CM | POA: Diagnosis not present

## 2022-05-27 DIAGNOSIS — M7672 Peroneal tendinitis, left leg: Secondary | ICD-10-CM | POA: Diagnosis not present

## 2022-05-27 DIAGNOSIS — M25672 Stiffness of left ankle, not elsewhere classified: Secondary | ICD-10-CM | POA: Diagnosis not present

## 2022-05-27 DIAGNOSIS — M25572 Pain in left ankle and joints of left foot: Secondary | ICD-10-CM | POA: Diagnosis not present

## 2022-06-01 DIAGNOSIS — M7672 Peroneal tendinitis, left leg: Secondary | ICD-10-CM | POA: Diagnosis not present

## 2022-06-01 DIAGNOSIS — M25572 Pain in left ankle and joints of left foot: Secondary | ICD-10-CM | POA: Diagnosis not present

## 2022-06-01 DIAGNOSIS — M25672 Stiffness of left ankle, not elsewhere classified: Secondary | ICD-10-CM | POA: Diagnosis not present

## 2022-06-01 DIAGNOSIS — M722 Plantar fascial fibromatosis: Secondary | ICD-10-CM | POA: Diagnosis not present

## 2022-06-03 DIAGNOSIS — M7672 Peroneal tendinitis, left leg: Secondary | ICD-10-CM | POA: Diagnosis not present

## 2022-06-03 DIAGNOSIS — M25672 Stiffness of left ankle, not elsewhere classified: Secondary | ICD-10-CM | POA: Diagnosis not present

## 2022-06-03 DIAGNOSIS — M722 Plantar fascial fibromatosis: Secondary | ICD-10-CM | POA: Diagnosis not present

## 2022-06-03 DIAGNOSIS — M25572 Pain in left ankle and joints of left foot: Secondary | ICD-10-CM | POA: Diagnosis not present

## 2022-06-04 DIAGNOSIS — S46011D Strain of muscle(s) and tendon(s) of the rotator cuff of right shoulder, subsequent encounter: Secondary | ICD-10-CM | POA: Diagnosis not present

## 2022-06-14 ENCOUNTER — Ambulatory Visit: Payer: PPO | Admitting: Neurology

## 2022-06-14 ENCOUNTER — Encounter: Payer: Self-pay | Admitting: Neurology

## 2022-06-14 VITALS — BP 103/65 | HR 77 | Ht 70.0 in | Wt 147.0 lb

## 2022-06-14 DIAGNOSIS — F988 Other specified behavioral and emotional disorders with onset usually occurring in childhood and adolescence: Secondary | ICD-10-CM

## 2022-06-14 DIAGNOSIS — M62838 Other muscle spasm: Secondary | ICD-10-CM | POA: Diagnosis not present

## 2022-06-14 DIAGNOSIS — G373 Acute transverse myelitis in demyelinating disease of central nervous system: Secondary | ICD-10-CM

## 2022-06-14 DIAGNOSIS — G4719 Other hypersomnia: Secondary | ICD-10-CM

## 2022-06-14 DIAGNOSIS — M5412 Radiculopathy, cervical region: Secondary | ICD-10-CM | POA: Diagnosis not present

## 2022-06-14 MED ORDER — BACLOFEN 10 MG PO TABS: ORAL_TABLET | ORAL | 3 refills | Status: DC

## 2022-06-14 NOTE — Progress Notes (Signed)
[facility]  PATIENT: [name] DOB: 1951-06-06  REFERRING CLINICIAN: Crist Infante HISTORY FROM: patient   REASON FOR VISIT: transverse myelitis, MS?   HISTORICAL  CHIEF COMPLAINT:  Chief Complaint  Patient presents with   Follow-up    Pt alone, rm 16. States that she is having severe cramps mostly in ankle and top of foot and some in calf BLE. This is mostly at night while sleeping or nap time.     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     Update 06/14/2022: She is getting cramps in her left foot > right.  These are worse at night.   When more severe they go up to the calf.    She feels strength is usually baseline but muscles fatigue easily.    She feels gait and balance are a little worse, including one fall in a food store and one in her garage.   Both occurred when she got off balanced with a turn.   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     She gets neck pain with headache, worse if she sleeps wrong.  She had surgery by Dr. Sherwood Gambler ([place] and C6C7 fusion and removal of hardware at [facility])   She has pain around the shoulder and into the hand.   She has allodynia there as well.    She gets tingling in her fingertips.     She feels she is doing cognitively better than last year - especially if she gets a good night's sleep..  She publicly speaks and is doing well in general.   .   She is on Ritalin with some benefit.   She also takes No-Doz.    She had difficulty with memory, processing and word finding 0- all better.      She is driving and is doing ok with that most days but sometimes will miss a turn    Her husband handles finances.     She also feels mood is doing well.       She is on lamotrigine 200 mg po qd (prescribed bid) for dysesthesias.   She thinks gabaoentin helped her in the past as well  In April 2021 she had an MI but catheterization was fine.   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    EF = 45%.  She notes SOB with exertion.    She has had SOB, with exertion.   She recently saw cardiology  She is tired and sleepy and dozes off easily.   She has been told she snores. A PSG in the past showed no OSA.       EPWORTH SLEEPINESS SCALE  On a scale of 0 - 3 what is the chance of dozing:  Sitting and Reading:   3 Watching TV:    3 Sitting inactive in a public place: 1 Passenger in car for one hour: 3 Lying down to rest in the afternoon: 3 Sitting and talking to someone: 0 Sitting quietly after lunch:  3 In a car, stopped in traffic:  1  Total (out of 24):   17/24   moderately excessively sleepy.         12/08/2021    1:08 PM 06/08/2021   11:47 AM 12/08/2020   11:16 AM 09/17/2019  4:57 PM 03/14/2019    4:19 PM  Montreal Cognitive Assessment   Visuospatial/ Executive (0/5) '4 4 2 2 3  '$ Naming (0/3) '3 3 3 3 3  '$ Attention: Read list of digits (0/2) '2 2 1 '$ 0 2  Attention: Read list of letters (0/1) '1 1 1 1 1  '$ Attention: Serial 7 subtraction starting at 100 (0/3) '1 1 1 1 1  '$ Language: Repeat phrase (0/2) 2 1 0 0 0  Language : Fluency (0/1) 1 1 0 0 1  Abstraction (0/2) '2 2 2 '$ 0 1  Delayed Recall (0/5) '3 5 4 3 2  '$ Orientation (0/6) '6 6 5 6 6  '$ Total '25 26 19 16 20  '$ Adjusted Score (based on education) '25 26 19 16 20     '$ TM/MS History:  In 1989, she had severe numbness in one side of her body.  She had clumsiness and poor gait.  MRI was reportedly normal.     She saw Dr. Erling Cruz who did an LP that showed oligoclonal bands.   She received IV steroids and was told she had MS.   In 1993, she woke up numb from the waist down in both legs.  This numbness was more intense than the prior episodes and gait was very poor.    She was admitted x 28 days, receiving many days of IV steroids followed by Rehab.     An MRI of the brain was normal but the MRI of the thoracic spine  showed a plaque at T4.     She was started on Betaseron and did well in general.  She had some fluctuating symptom but nothing resembling any of the other 3 episodes.   In November 2014 she had additional imaging studies showing a normal MRI of the brain, an abnormal MRI of the thoracic spine with a focus at T4, and a normal cervical spinal cord. She did have evidence of prior fusion surgery in the neck.  MRI of the cervical spine in September 2016 showed stable postoperative ACDF at C4-C5. The spinal cord appeared normal.   There was no myelopathy in the cervical spine. She also underwent a lumbar puncture in November 2014. It was normal and did not show oligoclonal bands or increased IgG index.    She has been treated with Betaseron, Avonex, Tysabri and Gilenya for presumptive MS in the past.   She stopped Tysabri as was JCV Ab positive and stopped Gilenya as she had macula edema.   She tried [place] but stopped due to insurance.  No DMT since 2014.      Imaging: MRI of the brain 09/15/2015 showed normal brain for age.  MRI of the cervical spine 09/25/2015 showed ACDF.  The spinal cord was normal.  My of the thoracic spine 02/19/2015 showed a single focus adjacent to T4 consistent with a chronic demyelinating plaque or chronic myelitis.  No change compared to 2014.  CT myelogram cervical spine 04/20/2019 showed a left central disc protrusion at C6-C7 effacing the ventral CSF.  There is no foraminal narrowing.  There is moderate foraminal narrowing bilaterally at C5-C6 but no spinal stenosis.  X-ray 05/17/2019 showed prior fusion at C3-C5 and fusion at C5-C7 associated with anterior fixation hardware.  MRI cervical spine MRI shows stable ACDF.  No spinal stenosis or nerve root  compression.      REVIEW OF SYSTEMS:  Constitutional: No fevers, chills, sweats, or change in .  She reports fatigue Eyes: as above Ear, nose and  throat: No hearing loss, ear pain, nasal congestion, sore throat Cardiovascular:  No chest pain, palpitations Respiratory:  No shortness of breath at rest or with exertion.   No wheezes GastrointestinaI: No nausea, vomiting, diarrhea, abdominal pain.  Reports fecal incontinence.   Reports dysphagia Genitourinary:  see above Musculoskeletal:  No neck pain, back pain.   Hasmuscle cramps Integumentary: No rash, pruritus, skin lesions Neurological: as above Psychiatric: No depression at this time.  No anxiety Endocrine: No palpitations, diaphoresis, change in appetite, change in weigh or increased thirst Hematologic/Lymphatic:  No anemia, purpura, petechiae. Allergic/Immunologic: No itchy/runny eyes, nasal congestion, recent allergic reactions, rashes  ALLERGIES: Allergies  Allergen Reactions   Other Shortness Of Breath    PURPLE LETTUCE   Betaseron [Interferon Beta-1b] Other (See Comments)    HARD NODULAR AREAS AT INJECTION SITE   Codeine Nausea And Vomiting   Gilenya [Fingolimod Hydrochloride] Other (See Comments)    MACULAR EDEMA   Morphine And Related Other (See Comments)    IV ROUTE - RED STREAKS OF VEIN   Sumatriptan Nausea Only    INJECTABLE ONLY - RAPID HEART RATE, FLUSHING   Tysabri [Natalizumab] Other (See Comments)    DEVELOPED JCV ANTIBODY   Latex Rash    HOME MEDICATIONS: Outpatient Medications Prior to Visit  Medication Sig Dispense Refill   Acetaminophen (TYLENOL EXTRA STRENGTH PO) Take 500-1,000 mg by mouth as needed.     Biotin 5000 MCG CAPS Take 1 capsule by mouth daily.     calcium carbonate (OS-CAL) 600 MG TABS tablet Take 1,200 mg by mouth.      denosumab (PROLIA) 60 MG/ML SOLN injection Inject 60 mg into the skin every 6 (six) months. Administer in upper arm, thigh, or abdomen     Dexlansoprazole 30 MG capsule DR Take 30 mg by mouth daily.     donepezil (ARICEPT) 10 MG tablet TAKE 1 TABLET BY MOUTH EVERY NIGHT AT BEDTIME 90 tablet 4   estradiol (ESTRACE) 0.1 MG/GM vaginal cream Place vaginally.     furosemide (LASIX) 20 MG tablet Take 1  tablet by mouth as needed for edema.     lamoTRIgine (LAMICTAL) 200 MG tablet Take 1 tablet (200 mg total) by mouth 2 (two) times daily. (Patient taking differently: Take 200 mg by mouth at bedtime.) 180 tablet 3   loratadine (CLARITIN) 10 MG tablet Take 10 mg by mouth daily.     methylphenidate (RITALIN) 10 MG tablet Take 1 tablet (10 mg total) by mouth 2 (two) times daily. 60 tablet 0   Multiple Vitamins-Minerals (MULTIVITAMIN PO) Take 1 tablet by mouth daily. Brand name - Mature Multivitamin from [employer]     potassium chloride (KLOR-CON M) 10 MEQ tablet Take 10 mEq by mouth as needed (only when taking furosemide).     Probiotic Product (PHILLIPS COLON HEALTH PO) Take 1 tablet by mouth every morning.     rOPINIRole (REQUIP) 0.25 MG tablet TAKE 1 TO 2 TABLETS BY MOUTH AT NIGHT 180 tablet 1   rosuvastatin (CRESTOR) 5 MG tablet Take 5 mg by mouth at bedtime.     vitamin B-12 (CYANOCOBALAMIN) 1000 MCG tablet Take 1,000 mcg by mouth daily.     VITAMIN D PO Take 2,000 Units by mouth daily.     HYDROcodone-acetaminophen (NORCO) 5-325 MG tablet Take 1 tablet by mouth every 6 (six) hours as needed for moderate pain. 30 tablet 0   HYDROcodone-acetaminophen (NORCO/VICODIN) 5-325 MG tablet Take 1 tablet by mouth every 6 hours as needed  for moderate pain 30 tablet 0   ibuprofen (ADVIL) 600 MG tablet Take 1 tablet (600 mg total) by mouth every 6 (six) hours as needed. 30 tablet 0   No facility-administered medications prior to visit.    PAST MEDICAL HISTORY: Past Medical History:  Diagnosis Date   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   Arthritis    back   Asthma    20 yrs., ago one episode   Chronic migraine headaches 03/13/2008   Chronic rhinitis 03/13/2008   Deviated septum 01/23/2020   Dysesthesia    bilateral feet   Dysphagia, oropharyngeal phase 10/21/2009   Dyspnea    Gastroesophageal reflux disease 09/10/2008   Heart murmur    first noticed 41 yrs. when pregnant, can't hear it now    History of colon polyps    2013;  2015   History of hiatal hernia    History of neoplasm    06-22-2011  s/p  appendectomy --  per path , low grade appendiceal mucinous neoplasm    HNP (herniated nucleus pulposus), cervical 05/17/2019   Hypokalemia 12/04/2019   Mitral valve prolapse    states no problem   Multiple sclerosis 1989   Myocarditis 04/25/2019   Neurogenic bladder    Obstructive chronic bronchitis with exacerbation 09/10/2008   Qualifier: Diagnosis of  By: Joya Gaskins MD, Burnett Harry    Occipital neuralgia 05/06/2015   Osteoporosis    Pneumonia    PONV (postoperative nausea and vomiting)    Pulmonary nodule, right upper lobe 01/22/2009   Short of breath on exertion    Sjogren's disease    dryness of eyes, mouth   Spastic gait    Transverse myelitis    T4   Urinary frequency 02/05/2015   Vitamin D deficiency 12/09/2016   Vocal cord disorder 10/21/2009   Wears bilateral ankle braces    AFO braces for stability    PAST SURGICAL HISTORY: Past Surgical History:  Procedure Laterality Date   ANAL RECTAL MANOMETRY N/A 08/18/2016   Procedure: ANO RECTAL MANOMETRY;  Surgeon: Leighton Ruff, MD;  Location: [facility];  Service: Endoscopy;  Laterality: N/A;   ANTERIOR CERVICAL DECOMP/DISCECTOMY FUSION  2004;   2003   C4 -- C5 (2004)/   C3 -- C4 (2003)   ANTERIOR CERVICAL DECOMP/DISCECTOMY FUSION N/A 05/17/2019   Procedure: EVACUATION HEMATOMA OF POST OP ANTERIOR CERVICAL DECOMPRESSION FUSION;  Surgeon: Jovita Gamma, MD;  Location: [facility];  Service: Neurosurgery;  Laterality: N/A;   ANTERIOR CERVICAL DECOMP/DISCECTOMY FUSION N/A 05/17/2019   Procedure: REMOVAL OF TETHER CERVICAL PLATE, ANTERIOR CERVICAL DECOMPRESSION/DISCECTOMY FUSION CERVICAL FIVE- CERVICAL SIX, CERVICAL SIX- CERVICAL SEVEN;  Surgeon: Jovita Gamma, MD;  Location: [facility];  Service: Neurosurgery;  Laterality: N/A;   APPENDECTOMY  06/22/2011   laparoscopic   BILATERAL SALPINGOOPHORECTOMY  1982   BLADDER SUSPENSION   1994   sling   BREAST ENHANCEMENT SURGERY Bilateral    BREAST IMPLANT REMOVAL Bilateral 12/03/2008   BUNIONECTOMY     CATARACT EXTRACTION W/ INTRAOCULAR LENS  IMPLANT, BILATERAL  2015   ELBOW SURGERY Right    HEMORROIDECTOMY  08/05/2010;  2013   INSERTION SACRAL NERVE STIMULATOR TEST WIRE  09-28-2016   dr Marcello Moores   INTERCOSTAL NERVE BLOCK  2015   occipital   MENISCUS REPAIR Left 09/26/2018   RECTAL ULTRASOUND N/A 08/18/2016   Procedure: RECTAL ULTRASOUND;  Surgeon: Leighton Ruff, MD;  Location: [facility];  Service: Endoscopy;  Laterality: N/A;   SHOULDER ARTHROSCOPY Left    SHOULDER ARTHROSCOPY WITH  DISTAL CLAVICLE RESECTION Right 09/21/2007   w/  Acrominoplasty,  Debridement Rotator Cuff,  CA ligament release   TONSILLECTOMY  age 78   TRIGGER FINGER RELEASE Left 01/16/2015   Procedure: LEFT THUMB TRIGGER RELEASE ;  Surgeon: Leanora Cover, MD;  Location: [facility];  Service: Orthopedics;  Laterality: Left;   VAGINAL HYSTERECTOMY  1981    FAMILY HISTORY: Family History  Problem Relation Age of Onset   Liver disease Father    Cirrhosis Father    Stroke Mother    Cancer Mother        oropharyngeal   Heart attack Mother    Anesthesia problems Mother        post-op nausea   Cancer Brother        twin; tonsillar?   Cancer Sister 82       breast ca, lung ca   Cancer Other        Nephew; appendiceal carcnoid, melanoma   Cancer Paternal Grandmother        cervical    SOCIAL HISTORY:  Social History   Socioeconomic History   Marital status: Married    Spouse name: Tom   Number of children: 2   Years of education: 13   Highest education level: Some college, no degree  Occupational History   Occupation: Retired  Tobacco Use   Smoking status: Never    Passive exposure: Yes   Smokeless tobacco: Never   Tobacco comments:    parents, husband, & multiple family members  Substance and Sexual Activity   Alcohol use: No    Alcohol/week: 0.0 standard drinks of  alcohol   Drug use: No   Sexual activity: Not on file  Other Topics Concern   Not on file  Social History Narrative   Pt lives at home with her spouse of 78 years.   Caffeine Use- Drinks caffeine occasionally.      [facility]:   From [place]. Previously did administrative work. She currently has a cat & dog. No bird exposure. No mold or hot tub exposure. No carpet or draperies.       Social Determinants of Health   Financial Resource Strain: Not on file  Food Insecurity: Not on file  Transportation Needs: Not on file  Physical Activity: Not on file  Stress: Not on file  Social Connections: Not on file  Intimate Partner Violence: Not on file     PHYSICAL EXAM  Vitals:   06/14/22 1100  BP: 103/65  Pulse: 77  Weight: 147 lb (66.7 kg)  Height: '5\' 10"'$  (1.778 m)    Body mass index is 21.09 kg/m.   General: The patient is well-developed and well-nourished and in no acute distress  Neck:    She has tenderness in the occiput, right greater than left.Marland Kitchen ROM is reduced in the neck..    Neurologic Exam  Mental status: The patient is alert and oriented at the time of the examination.  She scored 25 /30 on the [facility] cognitive assessment (details are above).  ..  Speech was normal..    Cranial nerves: Extraocular movements are full.  Facial strength is normal.. No dysarthria is noted.    Motor:  Very mild essential tremor noted with writing.   Muscle bulk and tone are normal.  Strength was 4/5 in the right triceps, 4-/5 ulnar intrinsic, 4/5 APB, 4++ in right biceps.  Strength was 5/5 in the left arm.  Rapid altering movements were performed slower on the  right than the left.   Strength in her legs was 4/5 proximally and 4 -/5 distally  Sensory: She has reduced sensation to touch below the mid thoracic level.  She has reduced vibration sensation in both legs symmetrically.      Coordination: Cerebellar testing shows good finger-nose-finger but reduced heel-to-shin  bilaterally..  Gait and station: Station is stable eyes open.  Her gait is spastic with bilateral foot drops, left greater than right.   She can walk without unilateral assistance in the room. She cannot do a tandem walk.  Mild left foot drop.   Romberg is positive.  Reflexes: Deep tendon reflexes are symmetric and normal in the arms but increased at the legs with crossed abductors at the knees.  No ankle clonus       ASSESSMENT AND PLAN  Transverse myelitis (HCC)  Excessive daytime sleepiness  Attention deficit disorder, unspecified hyperactivity presence  Cervical radiculopathy at C8  Muscle spasms of lower extremity, unspecified laterality   1.   She has transverse myelitis (T4) with fairly stable exam..  She has not developed other lesions so MS is unlikely.  2.   Continue methylphenidate prn.   Continue ropinirole for restless legs   Add baclofen prn for spasms 3.   Use cane or walker for safety.   4.   Memory is doing better.   5.   RTC 6 months, sooner if problems or based on the results.   Toby Ayad A. Felecia Shelling, MD, PhD 08/28/4096, 35:32 AM Certified in Neurology, Clinical Neurophysiology, Sleep Medicine, Pain Medicine and Neuroimaging  [facility] [address] [place], [place] [address] [phone]

## 2022-07-05 DIAGNOSIS — G373 Acute transverse myelitis in demyelinating disease of central nervous system: Secondary | ICD-10-CM | POA: Diagnosis not present

## 2022-07-05 DIAGNOSIS — R5383 Other fatigue: Secondary | ICD-10-CM | POA: Diagnosis not present

## 2022-07-05 DIAGNOSIS — R55 Syncope and collapse: Secondary | ICD-10-CM | POA: Diagnosis not present

## 2022-07-05 DIAGNOSIS — J111 Influenza due to unidentified influenza virus with other respiratory manifestations: Secondary | ICD-10-CM | POA: Diagnosis not present

## 2022-07-05 DIAGNOSIS — I10 Essential (primary) hypertension: Secondary | ICD-10-CM | POA: Diagnosis not present

## 2022-07-05 DIAGNOSIS — J45998 Other asthma: Secondary | ICD-10-CM | POA: Diagnosis not present

## 2022-07-05 DIAGNOSIS — R051 Acute cough: Secondary | ICD-10-CM | POA: Diagnosis not present

## 2022-07-05 DIAGNOSIS — N1831 Chronic kidney disease, stage 3a: Secondary | ICD-10-CM | POA: Diagnosis not present

## 2022-07-09 ENCOUNTER — Ambulatory Visit: Payer: PPO | Admitting: Podiatry

## 2022-07-20 ENCOUNTER — Ambulatory Visit: Payer: PPO | Admitting: Neurology

## 2022-07-20 ENCOUNTER — Encounter: Payer: Self-pay | Admitting: Neurology

## 2022-07-20 VITALS — BP 154/83 | HR 81 | Ht 70.0 in | Wt 148.0 lb

## 2022-07-20 DIAGNOSIS — Z981 Arthrodesis status: Secondary | ICD-10-CM

## 2022-07-20 DIAGNOSIS — R32 Unspecified urinary incontinence: Secondary | ICD-10-CM | POA: Diagnosis not present

## 2022-07-20 DIAGNOSIS — R42 Dizziness and giddiness: Secondary | ICD-10-CM

## 2022-07-20 DIAGNOSIS — R55 Syncope and collapse: Secondary | ICD-10-CM | POA: Diagnosis not present

## 2022-07-20 DIAGNOSIS — G373 Acute transverse myelitis in demyelinating disease of central nervous system: Secondary | ICD-10-CM | POA: Diagnosis not present

## 2022-07-20 MED ORDER — TAMSULOSIN HCL 0.4 MG PO CAPS: 0.4000 mg | ORAL_CAPSULE | Freq: Every day | ORAL | 5 refills | Status: DC

## 2022-07-20 NOTE — Progress Notes (Signed)
[facility]  PATIENT: [name] DOB: 04-Nov-1951  REFERRING CLINICIAN: Crist Infante HISTORY FROM: patient   REASON FOR VISIT: transverse myelitis, MS?   HISTORICAL  CHIEF COMPLAINT:  Chief Complaint  Patient presents with   Follow-up    RM 1, with daughter. Had 2 syncopal episodes in June. C/o balance issues.    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     Update 07/20/2022: She had 2 episodes of syncope.   She was leaving for a mission trip to [place] and was feeling dizzy/lightheaded with a buzzing in her ears.   Her daughter grabbed a wheelchair ad she passe out as she was being lowered into the wheelchair.  She did not respond for a few seconds but eyes open with a blank stare.  She then immediately came to and needed to use the bathroom.  After some fluids she felt good and travelled to [place].   Of note, she had not eaten or drank much prior to going to the airport  In [place] on a step, the daughter was going to help her but she fell landing on her knee (hitting wall before the floor) and there was no immediate LOC.  She stood up and then felt dizzy and then was not responsive.  She was rigid but no GTC.   She was confused a few seconds  Currently, she is at baseline.  She continues to have symptoms from the transverse myelitis in the past.  She has cramps in her left foot > right.  These are worse at night.   When more severe they go up to the calf.    She feels strength is usually baseline but muscles fatigue easily.    Gait and balance continue to be somewhat of a problem as she has had a few falls of.   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     She gets neck pain with headache, worse if she sleeps wrong.  She had surgery by Dr. Sherwood Gambler ([place] and C6C7 fusion and removal of hardware at [facility])   She has pain around the shoulder and into the hand.   She has allodynia there  as well.    She gets tingling in her fingertips.     Cognitively, she is doing about the same.  She does better when she gets a good night sleep.    She publicly speaks and is doing well in general.   .   She is on Ritalin with some benefit.   Over the years she has had some difficulty with memory, processing and word finding.   She is driving and is doing ok with that most days but sometimes will miss a turn    Her husband handles finances.    She also feels mood is doing well.       She has urinary incontinence.  She does note hesitancy and does not feel that she completely empties.  She has never been on tamsulosin.  In the past, Ditropan had not helped.  She is on lamotrigine 200 mg po qd (prescribed bid) for dysesthesias.   In the past she had been on gabapentin and also had some benefit.  In April 2021 she had an MI but catheterization was fine.   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  EF = 45%.  She notes SOB with exertion.    She has had SOB, with exertion.   She recently saw cardiology           12/08/2021    1:08 PM 06/08/2021   11:47 AM 12/08/2020   11:16 AM 09/17/2019    4:57 PM 03/14/2019    4:19 PM  Montreal Cognitive Assessment   Visuospatial/ Executive (0/5) '4 4 2 2 3  '$ Naming (0/3) '3 3 3 3 3  '$ Attention: Read list of digits (0/2) '2 2 1 '$ 0 2  Attention: Read list of letters (0/1) '1 1 1 1 1  '$ Attention: Serial 7 subtraction starting at 100 (0/3) '1 1 1 1 1  '$ Language: Repeat phrase (0/2) 2 1 0 0 0  Language : Fluency (0/1) 1 1 0 0 1  Abstraction (0/2) '2 2 2 '$ 0 1  Delayed Recall (0/5) '3 5 4 3 2  '$ Orientation (0/6) '6 6 5 6 6  '$ Total '25 26 19 16 20  '$ Adjusted Score (based on education) '25 26 19 16 20     '$ TM/MS History:  In 1989, she had severe numbness in one side of her body.  She had clumsiness and poor gait.  MRI was reportedly normal.     She saw Dr. Erling Cruz who did an LP that showed  oligoclonal bands.   She received IV steroids and was told she had MS.   In 1993, she woke up numb from the waist down in both legs.  This numbness was more intense than the prior episodes and gait was very poor.    She was admitted x 28 days, receiving many days of IV steroids followed by Rehab.     An MRI of the brain was normal but the MRI of the thoracic spine showed a plaque at T4.     She was started on Betaseron and did well in general.  She had some fluctuating symptom but nothing resembling any of the other 3 episodes.   In November 2014 she had additional imaging studies showing a normal MRI of the brain, an abnormal MRI of the thoracic spine with a focus at T4, and a normal cervical spinal cord. She did have evidence of prior fusion surgery in the neck.  MRI of the cervical spine in September 2016 showed stable postoperative ACDF at C4-C5. The spinal cord appeared normal.   There was no myelopathy in the cervical spine. She also underwent a lumbar puncture in November 2014. It was normal and did not show oligoclonal bands or increased IgG index.    She has been treated with Betaseron, Avonex, Tysabri and Gilenya for presumptive MS in the past.   She stopped Tysabri as was JCV Ab positive and stopped Gilenya as she had macula edema.   She tried [place] but stopped due to insurance.  No DMT since 2014.    We have discussed that she appears to have isolated transverse myelitis rather than MS.  Imaging: MRI of the brain 09/15/2015 showed normal brain for age.  MRI of the cervical spine 09/25/2015 showed ACDF.  The spinal cord was normal.  My of the thoracic spine 02/19/2015 showed a single focus adjacent to T4 consistent with a chronic demyelinating plaque or chronic myelitis.  No change compared to 2014.  CT myelogram cervical spine 04/20/2019 showed a left central disc protrusion at C6-C7 effacing the ventral CSF.  There is no foraminal narrowing.  There is moderate foraminal narrowing bilaterally at  C5-C6 but no spinal stenosis.  X-ray 05/17/2019 showed prior fusion at C3-C5 and fusion at C5-C7 associated with anterior fixation hardware.  MRI cervical spine 06/18/2021 shows stable ACDF.  She is fused C3-C7.  No spinal stenosis or nerve root  compression.      REVIEW OF SYSTEMS:  Constitutional: No fevers, chills, sweats, or change in .  She reports fatigue Eyes: as above Ear, nose and throat: No hearing loss, ear pain, nasal congestion, sore throat Cardiovascular: No chest pain, palpitations Respiratory:  No shortness of breath at rest or with exertion.   No wheezes GastrointestinaI: No nausea, vomiting, diarrhea, abdominal pain.  Reports fecal incontinence.   Reports dysphagia Genitourinary:  see above Musculoskeletal:  No neck pain, back pain.   Hasmuscle cramps Integumentary: No rash, pruritus, skin lesions Neurological: as above Psychiatric: No depression at this time.  No anxiety Endocrine: No palpitations, diaphoresis, change in appetite, change in weigh or increased thirst Hematologic/Lymphatic:  No anemia, purpura, petechiae. Allergic/Immunologic: No itchy/runny eyes, nasal congestion, recent allergic reactions, rashes  ALLERGIES: Allergies  Allergen Reactions   Other Shortness Of Breath    PURPLE LETTUCE   Betaseron [Interferon Beta-1b] Other (See Comments)    HARD NODULAR AREAS AT INJECTION SITE   Codeine Nausea And Vomiting   Gilenya [Fingolimod Hydrochloride] Other (See Comments)    MACULAR EDEMA   Morphine And Related Other (See Comments)    IV ROUTE - RED STREAKS OF VEIN   Sumatriptan Nausea Only    INJECTABLE ONLY - RAPID HEART RATE, FLUSHING   Tysabri [Natalizumab] Other (See Comments)    DEVELOPED JCV ANTIBODY   Latex Rash    HOME MEDICATIONS: Outpatient Medications Prior to Visit  Medication Sig Dispense Refill   Acetaminophen (TYLENOL EXTRA STRENGTH PO) Take 500-1,000 mg by mouth as needed.     baclofen (LIORESAL) 10 MG tablet 1/2 to one pill po tid  prn (Patient taking differently: Take 10 mg by mouth at bedtime. 1/2 to one pill po tid prn) 60 each 3   Biotin 5000 MCG CAPS Take 1 capsule by mouth daily.     calcium carbonate (OS-CAL) 600 MG TABS tablet Take 1,200 mg by mouth.      denosumab (PROLIA) 60 MG/ML SOLN injection Inject 60 mg into the skin every 6 (six) months. Administer in upper arm, thigh, or abdomen     Dexlansoprazole 30 MG capsule DR Take 30 mg by mouth daily.     donepezil (ARICEPT) 10 MG tablet TAKE 1 TABLET BY MOUTH EVERY NIGHT AT BEDTIME 90 tablet 4   estradiol (ESTRACE) 0.1 MG/GM vaginal cream Place vaginally.     furosemide (LASIX) 20 MG tablet Take 1 tablet by mouth as needed for edema.     lamoTRIgine (LAMICTAL) 200 MG tablet Take 1 tablet (200 mg total) by mouth 2 (two) times daily. (Patient taking differently: Take 200 mg by mouth at bedtime.) 180 tablet 3   loratadine (CLARITIN) 10 MG tablet Take 10 mg by mouth daily.     methylphenidate (RITALIN) 10 MG tablet Take 1 tablet (10 mg total) by mouth 2 (two) times daily. 60 tablet 0   Multiple Vitamins-Minerals (MULTIVITAMIN PO) Take 1 tablet by mouth daily. Brand name - Mature Multivitamin from [employer]     potassium chloride (KLOR-CON M) 10 MEQ tablet Take 10 mEq by mouth as needed (only when taking furosemide).     Probiotic Product (PHILLIPS COLON HEALTH PO) Take 1 tablet by mouth every morning.  rOPINIRole (REQUIP) 0.25 MG tablet TAKE 1 TO 2 TABLETS BY MOUTH AT NIGHT (Patient taking differently: Take 0.5 mg by mouth at bedtime. TAKE 1 TO 2 TABLETS BY MOUTH AT NIGHT) 180 tablet 1   rosuvastatin (CRESTOR) 5 MG tablet Take 5 mg by mouth at bedtime.     vitamin B-12 (CYANOCOBALAMIN) 1000 MCG tablet Take 1,000 mcg by mouth daily.     VITAMIN D PO Take 2,000 Units by mouth daily.     No facility-administered medications prior to visit.    PAST MEDICAL HISTORY: Past Medical History:  Diagnosis Date   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   Arthritis    back    Asthma    20 yrs., ago one episode   Chronic migraine headaches 03/13/2008   Chronic rhinitis 03/13/2008   Deviated septum 01/23/2020   Dysesthesia    bilateral feet   Dysphagia, oropharyngeal phase 10/21/2009   Dyspnea    Gastroesophageal reflux disease 09/10/2008   Heart murmur    first noticed 41 yrs. 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 , low grade appendiceal mucinous neoplasm    HNP (herniated nucleus pulposus), cervical 05/17/2019   Hypokalemia 12/04/2019   Mitral valve prolapse    states no problem   Multiple sclerosis 1989   Myocarditis 04/25/2019   Neurogenic bladder    Obstructive chronic bronchitis with exacerbation 09/10/2008   Qualifier: Diagnosis of  By: Joya Gaskins MD, Burnett Harry    Occipital neuralgia 05/06/2015   Osteoporosis    Pneumonia    PONV (postoperative nausea and vomiting)    Pulmonary nodule, right upper lobe 01/22/2009   Short of breath on exertion    Sjogren's disease    dryness of eyes, mouth   Spastic gait    Transverse myelitis    T4   Urinary frequency 02/05/2015   Vitamin D deficiency 12/09/2016   Vocal cord disorder 10/21/2009   Wears bilateral ankle braces    AFO braces for stability    PAST SURGICAL HISTORY: Past Surgical History:  Procedure Laterality Date   ANAL RECTAL MANOMETRY N/A 08/18/2016   Procedure: ANO RECTAL MANOMETRY;  Surgeon: Leighton Ruff, MD;  Location: [facility];  Service: Endoscopy;  Laterality: N/A;   ANTERIOR CERVICAL DECOMP/DISCECTOMY FUSION  2004;   2003   C4 -- C5 (2004)/   C3 -- C4 (2003)   ANTERIOR CERVICAL DECOMP/DISCECTOMY FUSION N/A 05/17/2019   Procedure: EVACUATION HEMATOMA OF POST OP ANTERIOR CERVICAL DECOMPRESSION FUSION;  Surgeon: Jovita Gamma, MD;  Location: [facility];  Service: Neurosurgery;  Laterality: N/A;   ANTERIOR CERVICAL DECOMP/DISCECTOMY FUSION N/A 05/17/2019    Procedure: REMOVAL OF TETHER CERVICAL PLATE, ANTERIOR CERVICAL DECOMPRESSION/DISCECTOMY FUSION CERVICAL FIVE- CERVICAL SIX, CERVICAL SIX- CERVICAL SEVEN;  Surgeon: Jovita Gamma, MD;  Location: [facility];  Service: Neurosurgery;  Laterality: N/A;   APPENDECTOMY  06/22/2011   laparoscopic   BILATERAL SALPINGOOPHORECTOMY  1982   BLADDER SUSPENSION  1994   sling   BREAST ENHANCEMENT SURGERY Bilateral    BREAST IMPLANT REMOVAL Bilateral 12/03/2008   BUNIONECTOMY     CATARACT EXTRACTION W/ INTRAOCULAR LENS  IMPLANT, BILATERAL  2015   ELBOW SURGERY Right    HEMORROIDECTOMY  08/05/2010;  2013   INSERTION SACRAL NERVE STIMULATOR TEST WIRE  09-28-2016   dr Marcello Moores  INTERCOSTAL NERVE BLOCK  2015   occipital   MENISCUS REPAIR Left 09/26/2018   RECTAL ULTRASOUND N/A 08/18/2016   Procedure: RECTAL ULTRASOUND;  Surgeon: Leighton Ruff, MD;  Location: [facility];  Service: Endoscopy;  Laterality: N/A;   SHOULDER ARTHROSCOPY Left    SHOULDER ARTHROSCOPY WITH DISTAL CLAVICLE RESECTION Right 09/21/2007   w/  Acrominoplasty,  Debridement Rotator Cuff,  CA ligament release   TONSILLECTOMY  age 35   TRIGGER FINGER RELEASE Left 01/16/2015   Procedure: LEFT THUMB TRIGGER RELEASE ;  Surgeon: Leanora Cover, MD;  Location: [facility];  Service: Orthopedics;  Laterality: Left;   VAGINAL HYSTERECTOMY  1981    FAMILY HISTORY: Family History  Problem Relation Age of Onset   Liver disease Father    Cirrhosis Father    Stroke Mother    Cancer Mother        oropharyngeal   Heart attack Mother    Anesthesia problems Mother        post-op nausea   Cancer Brother        twin; tonsillar?   Cancer Sister 65       breast ca, lung ca   Cancer Other        Nephew; appendiceal carcnoid, melanoma   Cancer Paternal Grandmother        cervical    SOCIAL HISTORY:  Social History   Socioeconomic History   Marital status: Married    Spouse name: Tom   Number of children: 2   Years of education: 13    Highest education level: Some college, no degree  Occupational History   Occupation: Retired  Tobacco Use   Smoking status: Never    Passive exposure: Yes   Smokeless tobacco: Never   Tobacco comments:    parents, husband, & multiple family members  Substance and Sexual Activity   Alcohol use: No    Alcohol/week: 0.0 standard drinks of alcohol   Drug use: No   Sexual activity: Not on file  Other Topics Concern   Not on file  Social History Narrative   Pt lives at home with her spouse of 60 years.   Caffeine Use- Drinks caffeine occasionally.      [facility]:   From [place]. Previously did administrative work. She currently has a cat & dog. No bird exposure. No mold or hot tub exposure. No carpet or draperies.       Social Determinants of Health   Financial Resource Strain: Not on file  Food Insecurity: Not on file  Transportation Needs: Not on file  Physical Activity: Not on file  Stress: Not on file  Social Connections: Not on file  Intimate Partner Violence: Not on file     PHYSICAL EXAM  Vitals:   07/20/22 0921  BP: (!) 154/83  Pulse: 81  Weight: 148 lb (67.1 kg)  Height: '5\' 10"'$  (1.778 m)    Body mass index is 21.24 kg/m.   General: The patient is well-developed and well-nourished and in no acute distress  Neck:    She has tenderness in the occiput, right greater than left.Marland Kitchen ROM is reduced in the neck..    Neurologic Exam  Mental status: The patient is alert and oriented at the time of the examination.  She scored 25 /30 on the [facility] cognitive assessment (details are above).  ..  Speech was normal..    Cranial nerves: Extraocular movements are full.  Facial strength is normal.. No dysarthria is noted.  Motor:  Very mild essential tremor noted with writing.   Muscle bulk and tone are normal.  Strength was 4/5 in the right triceps, 4-/5 ulnar intrinsic, 4/5 APB, 4++ in right biceps.  Strength was 5/5 in the left arm.  Rapid altering movements  were performed slower on the right than the left.   Strength in her legs was 4/5 proximally and 4 -/5 distally  Sensory: She has reduced sensation to touch below the mid thoracic level.  Mild reduced left arm sensation.  She has reduced vibration sensation in both legs symmetrically worse on left.   Coordination: Cerebellar testing shows good finger-nose-finger but reduced heel-to-shin bilaterally..  Gait and station: Station is stable eyes open.  She has a spastic gait with bilateral foot drops, worse on the left.  She does not need to use a cane within the room.  She is unable to do a tandem walk. Romberg is positive.  Reflexes: Deep tendon reflexes are symmetric and normal in the arms but increased at the legs with crossed abductors at the knees.  No ankle clonus       ASSESSMENT AND PLAN  Transverse myelitis ([place]) - Plan: MR BRAIN W WO CONTRAST, MR CERVICAL SPINE WO CONTRAST  Vertigo - Plan: EEG adult, MR BRAIN W WO CONTRAST  Syncope, unspecified syncope type - Plan: EEG adult  Urinary incontinence, unspecified type - Plan: MR CERVICAL SPINE WO CONTRAST  History of fusion of cervical spine - Plan: MR CERVICAL SPINE WO CONTRAST   1.   She has transverse myelitis (T4) with fairly stable exam..  She has not developed other lesions so MS is unlikely.  2.   Continue methylphenidate prn.   Continue ropinirole for restless legs   Add baclofen prn for spasms 3.   Use cane or walker for safety.   4.   Due to her 2 episodes of altered consciousness that might represent syncope.  We need to also rule out seizure and will check EEG and.   5.    Due to the worsening bladder function and neck stiffness, we will check MRI of the cervical spine.  She has known fusion from C3-C7 and I am concerned that she may have adjacent level disease that could be playing some role in her symptoms.   6.   RTC 6 months, sooner if problems or based on the results.   Jahnai Slingerland A. Felecia Shelling, MD, PhD 01/30/5596, 4:16  PM Certified in Neurology, Clinical Neurophysiology, Sleep Medicine, Pain Medicine and Neuroimaging  [facility] [address] [place], [place] [address] [phone]

## 2022-07-21 ENCOUNTER — Other Ambulatory Visit: Payer: Self-pay | Admitting: Neurology

## 2022-07-21 MED ORDER — METHYLPHENIDATE HCL 10 MG PO TABS: 10.0000 mg | ORAL_TABLET | Freq: Two times a day (BID) | ORAL | 0 refills | Status: DC

## 2022-07-21 NOTE — Addendum Note (Signed)
Addended by: Wyvonnia Lora on: 07/21/2022 01:50 PM   Modules accepted: Orders

## 2022-07-21 NOTE — Telephone Encounter (Signed)
Pt is calling and requesting a refill onmethylphenidate (RITALIN) 10 MG tablet. Pt is requesting this be sent to [facility] #[id number].

## 2022-07-22 ENCOUNTER — Other Ambulatory Visit (HOSPITAL_COMMUNITY): Payer: Self-pay | Admitting: Neurology

## 2022-07-22 ENCOUNTER — Telehealth: Payer: Self-pay | Admitting: Neurology

## 2022-07-22 DIAGNOSIS — R42 Dizziness and giddiness: Secondary | ICD-10-CM

## 2022-07-22 DIAGNOSIS — R32 Unspecified urinary incontinence: Secondary | ICD-10-CM

## 2022-07-22 DIAGNOSIS — G373 Acute transverse myelitis in demyelinating disease of central nervous system: Secondary | ICD-10-CM

## 2022-07-22 DIAGNOSIS — Z981 Arthrodesis status: Secondary | ICD-10-CM

## 2022-07-22 NOTE — Telephone Encounter (Signed)
healthteam advantage NPR sent to [facility]

## 2022-07-28 ENCOUNTER — Ambulatory Visit: Payer: PPO | Admitting: Neurology

## 2022-07-28 DIAGNOSIS — R55 Syncope and collapse: Secondary | ICD-10-CM | POA: Diagnosis not present

## 2022-07-28 DIAGNOSIS — R42 Dizziness and giddiness: Secondary | ICD-10-CM

## 2022-07-29 ENCOUNTER — Encounter: Payer: Self-pay | Admitting: Neurology

## 2022-07-29 NOTE — Progress Notes (Signed)
   [facility]  EEG (ELECTROENCEPHALOGRAM) REPORT   STUDY DATE: 07/28/2022 PATIENT NAME: [name] DOB: 1951/09/12 MRN: [id number]  ORDERING CLINICIAN: Monserat Prestigiacomo A. Felecia Shelling, MD. PhD  TECHNOLOGIST: Wyman Songster, REEGT TECHNIQUE: Electroencephalogram was recorded utilizing standard 10-20 system of lead placement and reformatted into average and bipolar montages.  RECORDING TIME: 24 minutes 13 seconds  CLINICAL INFORMATION: 71 year old woman with syncope and vertigo spells  FINDINGS: A digital EEG was performed while the patient was awake and drowsy. While awake and most alert there was a 11 hz posterior dominant rhythm. Voltages and frequencies were symmetric.  There were no focal, lateralizing, epileptiform activity or seizures seen.  Photic stimulation had a normal driving response. Hyperventilation and recovery did not change the underlying rhythms. EKG channel shows normal sinus rhythm.  Drowsiness and non-REM sleep were recorded during the second half of the study..  IMPRESSION: This is a normal EEG while the patient was awake and asleep.   INTERPRETING PHYSICIAN:   Sanii Kukla A. Felecia Shelling, MD, PhD, [facility] Certified in Neurology, Clinical Neurophysiology, Sleep Medicine, Pain Medicine and Neuroimaging  [facility] [address] [place], [place] [address] [phone]

## 2022-08-03 ENCOUNTER — Ambulatory Visit: Payer: PPO | Admitting: Podiatry

## 2022-08-03 DIAGNOSIS — M7672 Peroneal tendinitis, left leg: Secondary | ICD-10-CM

## 2022-08-03 DIAGNOSIS — M722 Plantar fascial fibromatosis: Secondary | ICD-10-CM | POA: Diagnosis not present

## 2022-08-03 DIAGNOSIS — S93401A Sprain of unspecified ligament of right ankle, initial encounter: Secondary | ICD-10-CM | POA: Diagnosis not present

## 2022-08-03 NOTE — Progress Notes (Signed)
Subjective: Chief Complaint  Patient presents with   Consult    surgery consult for plantar fasciitis, peroneal tendinitis left foot.    71 year old female presents the office today for follow-up evaluation of left heel pain, peroneal tendinitis, tear.  She said that she feels  "hopeful" and she has been making good progress.  Her pain is much improved.  No recent injury changes.  She recently got back from a mission trip and she did sprain her right ankle.  When she was in [place] she did have x-rays performed and was placed to a boot.  Some residual soreness is still present but she is able to wear regular shoes.    She does have altered gait given her multiple sclerosis.   Objective: AAO x3, NAD DP/PT pulses palpable bilaterally, CRT less than 3 seconds There is mild tenderness palpation along the plantar medial tubercle of the calcaneus at the insertion of plantar fascia on the left foot.  Also mild discomfort in the course the peroneal tendon on the left side with mild edema but there is no erythema or warmth.  No area pinpoint tenderness.  On the right side mild discomfort along the lateral ankle complex on the right side but no gross ankle instability is noted today.  No area pinpoint tenderness.  No pain or crepitation with ankle or subtalar joint range of motion.  No pain with calf compression, swelling, warmth, erythema  Assessment: Left continued heel pain, Plantar fasciitis, peroneal tendinitis; right ankle sprain  Plan: -All treatment options discussed with the patient including all alternatives, risks, complications.  -Overall she is making good progress that she has been doing better since I saw her last in regards to the left foot.  We discussed with conservative as well as surgical options.  She wants to hold off on surgical intervention for now she is doing better.  Continue range of motion, rehab exercises as well as wearing shoes and good arch support.  If symptoms continue or  worsen consider surgical intervention but for now continue conservative care.  On the right side we discussed stretching, rehab exercises well to help strengthen the ankle given the sprain.  Trula Slade DPM

## 2022-08-10 DIAGNOSIS — N3941 Urge incontinence: Secondary | ICD-10-CM | POA: Diagnosis not present

## 2022-08-10 DIAGNOSIS — N320 Bladder-neck obstruction: Secondary | ICD-10-CM | POA: Diagnosis not present

## 2022-08-10 DIAGNOSIS — R339 Retention of urine, unspecified: Secondary | ICD-10-CM | POA: Diagnosis not present

## 2022-08-13 ENCOUNTER — Ambulatory Visit: Payer: PPO | Admitting: Cardiology

## 2022-08-16 DIAGNOSIS — N3941 Urge incontinence: Secondary | ICD-10-CM | POA: Diagnosis not present

## 2022-08-17 ENCOUNTER — Other Ambulatory Visit: Payer: Self-pay | Admitting: Neurology

## 2022-08-27 DIAGNOSIS — R351 Nocturia: Secondary | ICD-10-CM | POA: Diagnosis not present

## 2022-08-27 DIAGNOSIS — N3941 Urge incontinence: Secondary | ICD-10-CM | POA: Diagnosis not present

## 2022-09-01 ENCOUNTER — Ambulatory Visit (HOSPITAL_COMMUNITY): Admission: RE | Admit: 2022-09-01 | Payer: PPO | Source: Ambulatory Visit

## 2022-09-01 ENCOUNTER — Ambulatory Visit (HOSPITAL_COMMUNITY): Payer: PPO | Attending: Neurology

## 2022-09-07 ENCOUNTER — Other Ambulatory Visit: Payer: Self-pay | Admitting: Neurology

## 2022-09-07 MED ORDER — METHYLPHENIDATE HCL 10 MG PO TABS: 10.0000 mg | ORAL_TABLET | Freq: Two times a day (BID) | ORAL | 0 refills | Status: DC

## 2022-09-07 NOTE — Telephone Encounter (Signed)
Pt is needing a refill request for her methylphenidate (RITALIN) 10 MG tablet sent in to the [facility] on [address].

## 2022-09-07 NOTE — Telephone Encounter (Signed)
Last seen 07/20/22 and next f/u 12/14/22. Per drug registry, last refilled 07/21/22 #60.

## 2022-10-05 DIAGNOSIS — E559 Vitamin D deficiency, unspecified: Secondary | ICD-10-CM | POA: Diagnosis not present

## 2022-10-05 DIAGNOSIS — I10 Essential (primary) hypertension: Secondary | ICD-10-CM | POA: Diagnosis not present

## 2022-10-05 DIAGNOSIS — R7989 Other specified abnormal findings of blood chemistry: Secondary | ICD-10-CM | POA: Diagnosis not present

## 2022-10-05 DIAGNOSIS — E785 Hyperlipidemia, unspecified: Secondary | ICD-10-CM | POA: Diagnosis not present

## 2022-10-11 ENCOUNTER — Other Ambulatory Visit: Payer: Self-pay | Admitting: Internal Medicine

## 2022-10-11 DIAGNOSIS — R82998 Other abnormal findings in urine: Secondary | ICD-10-CM | POA: Diagnosis not present

## 2022-10-11 DIAGNOSIS — I129 Hypertensive chronic kidney disease with stage 1 through stage 4 chronic kidney disease, or unspecified chronic kidney disease: Secondary | ICD-10-CM | POA: Diagnosis not present

## 2022-10-11 DIAGNOSIS — G373 Acute transverse myelitis in demyelinating disease of central nervous system: Secondary | ICD-10-CM | POA: Diagnosis not present

## 2022-10-11 DIAGNOSIS — M810 Age-related osteoporosis without current pathological fracture: Secondary | ICD-10-CM | POA: Diagnosis not present

## 2022-10-11 DIAGNOSIS — R911 Solitary pulmonary nodule: Secondary | ICD-10-CM | POA: Diagnosis not present

## 2022-10-11 DIAGNOSIS — I213 ST elevation (STEMI) myocardial infarction of unspecified site: Secondary | ICD-10-CM | POA: Diagnosis not present

## 2022-10-11 DIAGNOSIS — Z0000 Encounter for general adult medical examination without abnormal findings: Secondary | ICD-10-CM | POA: Diagnosis not present

## 2022-10-11 DIAGNOSIS — Z23 Encounter for immunization: Secondary | ICD-10-CM | POA: Diagnosis not present

## 2022-10-11 DIAGNOSIS — G35 Multiple sclerosis: Secondary | ICD-10-CM | POA: Diagnosis not present

## 2022-10-11 DIAGNOSIS — I10 Essential (primary) hypertension: Secondary | ICD-10-CM | POA: Diagnosis not present

## 2022-10-11 DIAGNOSIS — N1831 Chronic kidney disease, stage 3a: Secondary | ICD-10-CM | POA: Diagnosis not present

## 2022-10-11 DIAGNOSIS — I2510 Atherosclerotic heart disease of native coronary artery without angina pectoris: Secondary | ICD-10-CM | POA: Diagnosis not present

## 2022-10-11 DIAGNOSIS — E785 Hyperlipidemia, unspecified: Secondary | ICD-10-CM | POA: Diagnosis not present

## 2022-10-11 DIAGNOSIS — G629 Polyneuropathy, unspecified: Secondary | ICD-10-CM | POA: Diagnosis not present

## 2022-10-11 DIAGNOSIS — I700 Atherosclerosis of aorta: Secondary | ICD-10-CM | POA: Diagnosis not present

## 2022-10-11 DIAGNOSIS — M419 Scoliosis, unspecified: Secondary | ICD-10-CM | POA: Diagnosis not present

## 2022-10-19 ENCOUNTER — Other Ambulatory Visit: Payer: Self-pay | Admitting: Neurology

## 2022-10-19 MED ORDER — METHYLPHENIDATE HCL 10 MG PO TABS: 10.0000 mg | ORAL_TABLET | Freq: Two times a day (BID) | ORAL | 0 refills | Status: DC

## 2022-10-19 NOTE — Telephone Encounter (Signed)
Pt request refill for  methylphenidate (RITALIN) 10 MG tablet at  [id number]    

## 2022-10-29 ENCOUNTER — Other Ambulatory Visit (HOSPITAL_COMMUNITY): Payer: Self-pay | Admitting: *Deleted

## 2022-10-29 ENCOUNTER — Other Ambulatory Visit: Payer: Self-pay | Admitting: Neurology

## 2022-11-01 ENCOUNTER — Ambulatory Visit (HOSPITAL_COMMUNITY)
Admission: RE | Admit: 2022-11-01 | Discharge: 2022-11-01 | Disposition: A | Discharge status: Home / Self Care | Payer: PPO | Source: Ambulatory Visit | Attending: Internal Medicine | Admitting: Internal Medicine

## 2022-11-01 DIAGNOSIS — M810 Age-related osteoporosis without current pathological fracture: Secondary | ICD-10-CM | POA: Diagnosis not present

## 2022-11-01 MED ORDER — DENOSUMAB 60 MG/ML ~~LOC~~ SOSY
60.0000 mg | PREFILLED_SYRINGE | Freq: Once | SUBCUTANEOUS | Status: AC
  Administered 2022-11-01: 60 mg via SUBCUTANEOUS

## 2022-11-01 MED ORDER — DENOSUMAB 60 MG/ML ~~LOC~~ SOSY
PREFILLED_SYRINGE | SUBCUTANEOUS | Status: AC
  Filled 2022-11-01: qty 1

## 2022-11-16 ENCOUNTER — Ambulatory Visit
Admission: RE | Admit: 2022-11-16 | Discharge: 2022-11-16 | Disposition: A | Discharge status: Home / Self Care | Payer: PPO | Source: Ambulatory Visit | Attending: Internal Medicine | Admitting: Internal Medicine

## 2022-11-16 DIAGNOSIS — R911 Solitary pulmonary nodule: Secondary | ICD-10-CM

## 2022-11-16 DIAGNOSIS — I700 Atherosclerosis of aorta: Secondary | ICD-10-CM | POA: Diagnosis not present

## 2022-11-22 ENCOUNTER — Other Ambulatory Visit: Payer: Self-pay | Admitting: Neurology

## 2022-11-25 ENCOUNTER — Other Ambulatory Visit: Payer: Self-pay | Admitting: Neurology

## 2022-11-25 MED ORDER — METHYLPHENIDATE HCL 10 MG PO TABS: 10.0000 mg | ORAL_TABLET | Freq: Two times a day (BID) | ORAL | 0 refills | Status: DC

## 2022-11-25 NOTE — Telephone Encounter (Signed)
Last seen 07/20/22 and next f/u 12/14/22. Per drug registry, last refilled 10/19/22 #60.

## 2022-11-25 NOTE — Telephone Encounter (Signed)
Pt is calling. Requesting a refill on medication methylphenidate (RITALIN) 10 MG tablet. Refill should be sent to [facility] [phone]

## 2022-11-30 DIAGNOSIS — Z01419 Encounter for gynecological examination (general) (routine) without abnormal findings: Secondary | ICD-10-CM | POA: Diagnosis not present

## 2022-11-30 DIAGNOSIS — N952 Postmenopausal atrophic vaginitis: Secondary | ICD-10-CM | POA: Diagnosis not present

## 2022-11-30 DIAGNOSIS — Z01411 Encounter for gynecological examination (general) (routine) with abnormal findings: Secondary | ICD-10-CM | POA: Diagnosis not present

## 2022-11-30 DIAGNOSIS — Z6821 Body mass index (BMI) 21.0-21.9, adult: Secondary | ICD-10-CM | POA: Diagnosis not present

## 2022-11-30 DIAGNOSIS — Z124 Encounter for screening for malignant neoplasm of cervix: Secondary | ICD-10-CM | POA: Diagnosis not present

## 2022-12-09 NOTE — Patient Instructions (Signed)
Below is our plan:  We will continue baclofen '10mg'$  up to three times daily. Try increasing lamotrigine to '200mg'$  twice daily to see if this helps with burning in feet at night. I recommend taking '200mg'$  in am and taking '100mg'$  (1/2 tablet) in the evenings for a week or two to make sure you can tolerate then increase dose if needed. Continue ropinirole 2 tablets at bedtime for restless legs. Continue donepezil for memory support and methylphenidate '10mg'$  twice daily for help with attention and focus. Continue tamsulosin for urinary flow.   Please make sure you are staying well hydrated. I recommend 50-60 ounces daily. Well balanced diet and regular exercise encouraged. Consistent sleep schedule with 6-8 hours recommended.   Please continue follow up with care team as directed.   Follow up with Dr Felecia Shelling in 6 months   You may receive a survey regarding today's visit. I encourage you to leave honest feed back as I do use this information to improve patient care. Thank you for seeing me today!

## 2022-12-09 NOTE — Progress Notes (Signed)
PATIENT: [name] DOB: 11/09/1951  REASON FOR VISIT: follow up HISTORY FROM: patient  Chief Complaint  Patient presents with   Follow-up    Pt in room #1 and alone.  Pt here today for f/u Transverse myelitis.    HISTORY OF PRESENT ILLNESS:  12/14/22 [name] is a 71 y.o. female here today for follow up for transverse myelitis.   She feels symptoms are fairly stable. No new or exacerbating symptoms. She feels gait is stable but she continues to have significant lower ext weakness. She is walking daily on her treadmill and tries to walk around her neighborhood. She uses her cane. She continues baclofen, usually 1 tablet daily.   No additional falls or concerns of syncope. She had an EEG in 07/2022 that was normal.   She continues to have burning in feet and hands.Feet hurt worse at night. Lamictal '200mg'$  daily helps. She has not taken twice a day in some time. She  Requip 0.'5mg'$  helps with restless legs.  She feels that memory concerns wax and wane. She does not feel there has been any significant worsening. She has trouble with word recall. She does feel that short term memory is worse when she is tired. She is tolerating donepezil '10mg'$  daily. She is also taking methylphenidate '10mg'$  twice daily. She feels that this helps a little. She continues to drive without difficulty. She manages her mediations. Husband manages finances. She performs ADLs without difficulty. She had neuropsych eval with Dr Melvyn Novas in 2021 concerning for ADHD.   She feels mood is stable. She sleeps fairly well. Her husband has sleep apnea and she reports his machine wakes her at night.   She does have urge incontinence. Tamsulosin helps. She is followed through urology. She has a nerve stimulator that helps with both bowel and bladder incontinence. She did not update cervical imaging ordered at last visit with Dr Felecia Shelling.    HISTORY: (copied from Dr Garth Bigness previous note)  [name] is a 71  y.o. 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      Update 07/20/2022: She had 2 episodes of syncope.   She was leaving for a mission trip to [place] and was feeling dizzy/lightheaded with a buzzing in her ears.   Her daughter grabbed a wheelchair ad she passe out as she was being lowered into the wheelchair.  She did not respond for a few seconds but eyes open with a blank stare.  She then immediately came to and needed to use the bathroom.  After some fluids she felt good and travelled to [place].   Of note, she had not eaten or drank much prior to going to the airport   In [place] on a step, the daughter was going to help her but she fell landing on her knee (hitting wall before the floor) and there was no immediate LOC.  She stood up and then felt dizzy and then was not responsive.  She was rigid but no GTC.   She was confused a few seconds   Currently, she is at baseline.  She continues to have symptoms from the transverse myelitis in the past.  She has cramps in her left foot > right.  These are worse at night.   When more severe they go up to the calf.    She feels strength is usually baseline but muscles fatigue easily.     Gait and balance continue to be somewhat of a problem as she has had a  few falls of.   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      She gets neck pain with headache, worse if she sleeps wrong.  She had surgery by Dr. Sherwood Gambler ([place] and C6C7 fusion and removal of hardware at [facility])   She has pain around the shoulder and into the hand.   She has allodynia there as well.    She gets tingling in her fingertips.      Cognitively, she is doing about the same.  She does better when she gets a good night sleep.    She publicly speaks and is doing well in general.   .   She is on Ritalin with some benefit.   Over the years she has had some difficulty with memory, processing and word finding.   She is driving and is doing ok with that most days but sometimes will miss a  turn    Her husband handles finances.    She also feels mood is doing well.        She has urinary incontinence.  She does note hesitancy and does not feel that she completely empties.  She has never been on tamsulosin.  In the past, Ditropan had not helped.   She is on lamotrigine 200 mg po qd (prescribed bid) for dysesthesias.   In the past she had been on gabapentin and also had some benefit.   In April 2021 she had an MI but catheterization was fine.   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    EF = 45%.  She notes SOB with exertion.    She has had SOB, with exertion.   She recently saw cardiology.   TM/MS History:  In 1989, she had severe numbness in one side of her body.  She had clumsiness and poor gait.  MRI was reportedly normal.     She saw Dr. Erling Cruz who did an LP that showed oligoclonal bands.   She received IV steroids and was told she had MS.   In 1993, she woke up numb from the waist down in both legs.  This numbness was more intense than the prior episodes and gait was very poor.    She was admitted x 28 days, receiving many days of IV steroids followed by Rehab.     An MRI of the brain was normal but the MRI of the thoracic spine showed a plaque at T4.     She was started on Betaseron and did well in general.  She had some fluctuating symptom but nothing resembling any of the other 3 episodes.   In November 2014 she had additional imaging studies showing a normal MRI of the brain, an abnormal MRI of the thoracic spine with a focus at T4, and a normal cervical spinal cord. She did have evidence of prior fusion surgery in the neck.  MRI of the cervical spine in September 2016 showed stable postoperative ACDF at C4-C5. The spinal cord appeared normal.   There was no myelopathy in the cervical spine. She also underwent a lumbar puncture in November 2014. It was normal and did not show oligoclonal bands or increased  IgG index.    She has been treated with Betaseron, Avonex, Tysabri and Gilenya for presumptive MS in the past.   She stopped Tysabri as was JCV Ab positive and stopped Gilenya as  she had macula edema.   She tried [place] but stopped due to insurance.  No DMT since 2014.    We have discussed that she appears to have isolated transverse myelitis rather than MS.   Imaging: MRI of the brain 09/15/2015 showed normal brain for age.   MRI of the cervical spine 09/25/2015 showed ACDF.  The spinal cord was normal.   My of the thoracic spine 02/19/2015 showed a single focus adjacent to T4 consistent with a chronic demyelinating plaque or chronic myelitis.  No change compared to 2014.   CT myelogram cervical spine 04/20/2019 showed a left central disc protrusion at C6-C7 effacing the ventral CSF.  There is no foraminal narrowing.  There is moderate foraminal narrowing bilaterally at C5-C6 but no spinal stenosis.   X-ray 05/17/2019 showed prior fusion at C3-C5 and fusion at C5-C7 associated with anterior fixation hardware.   MRI cervical spine 06/18/2021 shows stable ACDF.  She is fused C3-C7.  No spinal stenosis or nerve root  compression.   REVIEW OF SYSTEMS: Out of a complete 14 system review of symptoms, the patient complains only of the following symptoms, and all other reviewed systems are negative.  ALLERGIES: Allergies  Allergen Reactions   Other Shortness Of Breath    PURPLE LETTUCE   Betaseron [Interferon Beta-1b] Other (See Comments)    HARD NODULAR AREAS AT INJECTION SITE   Codeine Nausea And Vomiting   Gilenya [Fingolimod Hydrochloride] Other (See Comments)    MACULAR EDEMA   Morphine And Related Other (See Comments)    IV ROUTE - RED STREAKS OF VEIN   Sumatriptan Nausea Only    INJECTABLE ONLY - RAPID HEART RATE, FLUSHING   Tysabri [Natalizumab] Other (See Comments)    DEVELOPED JCV ANTIBODY   Zinc Nicotinate [Zinc]    Latex Rash    HOME MEDICATIONS: Outpatient Medications Prior to  Visit  Medication Sig Dispense Refill   Acetaminophen (TYLENOL EXTRA STRENGTH PO) Take 500-1,000 mg by mouth as needed.     Biotin 5000 MCG CAPS Take 1 capsule by mouth daily.     calcium carbonate (OS-CAL) 600 MG TABS tablet Take 1,200 mg by mouth.      denosumab (PROLIA) 60 MG/ML SOLN injection Inject 60 mg into the skin every 6 (six) months. Administer in upper arm, thigh, or abdomen     Dexlansoprazole 30 MG capsule DR Take 30 mg by mouth daily.     estradiol (ESTRACE) 0.1 MG/GM vaginal cream Place vaginally.     furosemide (LASIX) 20 MG tablet Take 1 tablet by mouth as needed for edema.     loratadine (CLARITIN) 10 MG tablet Take 10 mg by mouth daily.     methylphenidate (RITALIN) 10 MG tablet Take 1 tablet (10 mg total) by mouth 2 (two) times daily. 60 tablet 0   Multiple Vitamins-Minerals (MULTIVITAMIN PO) Take 1 tablet by mouth daily. Brand name - Mature Multivitamin from [employer]     potassium chloride (KLOR-CON M) 10 MEQ tablet Take 10 mEq by mouth as needed (only when taking furosemide).     Probiotic Product (PHILLIPS COLON HEALTH PO) Take 1 tablet by mouth every morning.     rOPINIRole (REQUIP) 0.25 MG tablet TAKE 1 TO 2 TABLETS BY MOUTH AT NIGHT 180 tablet 3   rosuvastatin (CRESTOR) 5 MG tablet Take 20 mg by mouth at bedtime.     tamsulosin (FLOMAX) 0.4 MG CAPS capsule Take 1 capsule (0.4 mg total) by mouth daily. 30 capsule 5  vitamin B-12 (CYANOCOBALAMIN) 1000 MCG tablet Take 1,000 mcg by mouth daily.     VITAMIN D PO Take 2,000 Units by mouth daily.     baclofen (LIORESAL) 10 MG tablet TAKE 1/2 TO 1 TABLET BY MOUTH THREE TIMES DAILY AS NEEDED 90 tablet 0   donepezil (ARICEPT) 10 MG tablet TAKE 1 TABLET BY MOUTH EVERY NIGHT AT BEDTIME 90 tablet 4   lamoTRIgine (LAMICTAL) 200 MG tablet Take 1 tablet (200 mg total) by mouth 2 (two) times daily. (Patient taking differently: Take 200 mg by mouth at bedtime.) 180 tablet 3   No facility-administered medications prior to visit.     PAST MEDICAL HISTORY: Past Medical History:  Diagnosis Date   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   Arthritis    back   Asthma    20 yrs., ago one episode   Chronic migraine headaches 03/13/2008   Chronic rhinitis 03/13/2008   Deviated septum 01/23/2020   Dysesthesia    bilateral feet   Dysphagia, oropharyngeal phase 10/21/2009   Dyspnea    Gastroesophageal reflux disease 09/10/2008   Heart murmur    first noticed 41 yrs. 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 , low grade appendiceal mucinous neoplasm    HNP (herniated nucleus pulposus), cervical 05/17/2019   Hypokalemia 12/04/2019   Mitral valve prolapse    states no problem   Multiple sclerosis 1989   Myocarditis 04/25/2019   Neurogenic bladder    Obstructive chronic bronchitis with exacerbation 09/10/2008   Qualifier: Diagnosis of  By: Joya Gaskins MD, Burnett Harry    Occipital neuralgia 05/06/2015   Osteoporosis    Pneumonia    PONV (postoperative nausea and vomiting)    Pulmonary nodule, right upper lobe 01/22/2009   Short of breath on exertion    Sjogren's disease    dryness of eyes, mouth   Spastic gait    Transverse myelitis    T4   Urinary frequency 02/05/2015   Vitamin D deficiency 12/09/2016   Vocal cord disorder 10/21/2009   Wears bilateral ankle braces    AFO braces for stability    PAST SURGICAL HISTORY: Past Surgical History:  Procedure Laterality Date   ANAL RECTAL MANOMETRY N/A 08/18/2016   Procedure: ANO RECTAL MANOMETRY;  Surgeon: Leighton Ruff, MD;  Location: [facility];  Service: Endoscopy;  Laterality: N/A;   ANTERIOR CERVICAL DECOMP/DISCECTOMY FUSION  2004;   2003   C4 -- C5 (2004)/   C3 -- C4 (2003)   ANTERIOR CERVICAL DECOMP/DISCECTOMY FUSION N/A 05/17/2019   Procedure: EVACUATION HEMATOMA OF POST OP ANTERIOR CERVICAL DECOMPRESSION FUSION;  Surgeon: Jovita Gamma, MD;  Location: [facility];  Service: Neurosurgery;  Laterality: N/A;   ANTERIOR CERVICAL DECOMP/DISCECTOMY FUSION N/A 05/17/2019   Procedure: REMOVAL OF TETHER CERVICAL PLATE, ANTERIOR CERVICAL DECOMPRESSION/DISCECTOMY FUSION CERVICAL FIVE- CERVICAL SIX, CERVICAL SIX- CERVICAL SEVEN;  Surgeon: Jovita Gamma, MD;  Location: [facility];  Service: Neurosurgery;  Laterality: N/A;   APPENDECTOMY  06/22/2011   laparoscopic   BILATERAL SALPINGOOPHORECTOMY  1982   BLADDER SUSPENSION  1994   sling   BREAST ENHANCEMENT SURGERY Bilateral    BREAST IMPLANT REMOVAL Bilateral 12/03/2008   BUNIONECTOMY     CATARACT EXTRACTION W/ INTRAOCULAR LENS  IMPLANT, BILATERAL  2015   ELBOW SURGERY Right    HEMORROIDECTOMY  08/05/2010;  2013   INSERTION SACRAL NERVE STIMULATOR TEST WIRE  09-28-2016   dr Garner Nash NERVE BLOCK  2015   occipital   MENISCUS REPAIR Left 09/26/2018   RECTAL ULTRASOUND N/A 08/18/2016   Procedure: RECTAL ULTRASOUND;  Surgeon: Leighton Ruff, MD;  Location: [facility];  Service: Endoscopy;  Laterality: N/A;   SHOULDER ARTHROSCOPY Left    SHOULDER ARTHROSCOPY WITH DISTAL CLAVICLE RESECTION Right 09/21/2007   w/  Acrominoplasty,  Debridement Rotator Cuff,  CA ligament release   TONSILLECTOMY  age 43   TRIGGER FINGER RELEASE Left 01/16/2015   Procedure: LEFT THUMB TRIGGER RELEASE ;  Surgeon: Leanora Cover, MD;  Location: [facility];  Service: Orthopedics;  Laterality: Left;   VAGINAL HYSTERECTOMY  1981    FAMILY HISTORY: Family History  Problem Relation Age of Onset   Liver disease Father    Cirrhosis Father    Stroke Mother    Cancer Mother        oropharyngeal   Heart attack Mother    Anesthesia problems Mother        post-op nausea   Cancer Brother        twin; tonsillar?   Cancer Sister 68       breast ca, lung ca   Cancer Other        Nephew; appendiceal carcnoid, melanoma   Cancer Paternal Grandmother        cervical    SOCIAL HISTORY: Social  History   Socioeconomic History   Marital status: Married    Spouse name: Tom   Number of children: 2   Years of education: 13   Highest education level: Some college, no degree  Occupational History   Occupation: Retired  Tobacco Use   Smoking status: Never    Passive exposure: Yes   Smokeless tobacco: Never   Tobacco comments:    parents, husband, & multiple family members  Substance and Sexual Activity   Alcohol use: No    Alcohol/week: 0.0 standard drinks of alcohol   Drug use: No   Sexual activity: Not on file  Other Topics Concern   Not on file  Social History Narrative   Pt lives at home with her spouse of 28 years.   Caffeine Use- Drinks caffeine occasionally.      [facility]:   From [place]. Previously did administrative work. She currently has a cat & dog. No bird exposure. No mold or hot tub exposure. No carpet or draperies.       Social Determinants of Health   Financial Resource Strain: Not on file  Food Insecurity: Not on file  Transportation Needs: Not on file  Physical Activity: Not on file  Stress: Not on file  Social Connections: Not on file  Intimate Partner Violence: Not on file    PHYSICAL EXAM  Vitals:   12/14/22 1055  BP: (!) 140/77  Pulse: 85  Weight: 142 lb 8 oz (64.6 kg)  Height: '5\' 10"'$  (1.778 m)    Body mass index is 20.45 kg/m.  Generalized: Well developed, in no acute distress  Cardiology: normal rate and rhythm, no murmur noted Respiratory: clear to auscultation bilaterally  Neurological examination  Mentation: Alert oriented to time, place, history taking. Follows all commands speech and language fluent Cranial nerve II-XII: Pupils were equal round reactive to light. Extraocular movements were full, visual field were full on confrontational test. Facial sensation and strength were normal. Uvula tongue midline. Head turning and shoulder  shrug  were normal and symmetric. Motor: The motor testing reveals 5 over 5 strength of  bilateral upper extremities, 4/5 of bilateral lower extremities. Good symmetric motor tone is noted throughout.  Sensory: Sensory testing is intact to soft touch on all 4 extremities. No evidence of extinction is noted.  Coordination: Cerebellar testing reveals good finger-nose-finger and heel-to-shin bilaterally.  Gait and station: Gait is mildly spastic, walks with cane, makes wide turns with multiple steps, mostly stable with cane. Tandem gait not attempted.  Reflexes: Deep tendon reflexes are symmetric and normal bilaterally.   DIAGNOSTIC DATA (LABS, IMAGING, TESTING) - I reviewed patient records, labs, notes, testing and imaging myself where available.      No data to display           Lab Results  Component Value Date   WBC 7.0 12/05/2019   HGB 12.9 12/05/2019   HCT 40.0 12/05/2019   MCV 95.2 12/05/2019   PLT 226 12/05/2019      Component Value Date/Time   NA 140 12/07/2019 0559   NA 142 02/05/2015 1057   K 4.1 12/07/2019 0559   CL 105 12/07/2019 0559   CO2 27 12/07/2019 0559   GLUCOSE 101 (H) 12/07/2019 0559   BUN 5 (L) 12/07/2019 0559   BUN 17 02/05/2015 1057   CREATININE 0.94 12/07/2019 0559   CALCIUM 8.8 (L) 12/07/2019 0559   PROT 5.3 (L) 12/05/2019 0354   PROT 6.7 02/05/2015 1057   ALBUMIN 3.1 (L) 12/05/2019 0354   ALBUMIN 4.8 02/05/2015 1057   AST 13 (L) 12/05/2019 0354   ALT 13 12/05/2019 0354   ALKPHOS 67 12/05/2019 0354   BILITOT 0.2 (L) 12/05/2019 0354   BILITOT 0.3 02/05/2015 1057   GFRNONAA >60 12/07/2019 0559   GFRAA >60 12/07/2019 0559   No results found for: "CHOL", "HDL", "LDLCALC", "LDLDIRECT", "TRIG", "CHOLHDL" No results found for: "HGBA1C" Lab Results  Component Value Date   VITAMINB12 499 03/14/2019   Lab Results  Component Value Date   TSH 1.899 10/31/2019       No data to display              12/14/2022   11:20 AM 12/08/2021    1:08 PM 06/08/2021   11:47 AM 12/08/2020   11:16 AM 09/17/2019    4:57 PM  [place]  Cognitive Assessment   Visuospatial/ Executive (0/5) '3 4 4 2 2  '$ Naming (0/3) '3 3 3 3 3  '$ Attention: Read list of digits (0/2) '2 2 2 1 '$ 0  Attention: Read list of letters (0/1) '1 1 1 1 1  '$ Attention: Serial 7 subtraction starting at 100 (0/3) '1 1 1 1 1  '$ Language: Repeat phrase (0/2) '1 2 1 '$ 0 0  Language : Fluency (0/1) '1 1 1 '$ 0 0  Abstraction (0/2) '2 2 2 2 '$ 0  Delayed Recall (0/5) '3 3 5 4 3  '$ Orientation (0/6) '6 6 6 5 6  '$ Total '23 25 26 19 16  '$ Adjusted Score (based on education) '23 25 26 19 16     '$ ASSESSMENT AND PLAN 71 y.o. year old female  has a past medical history of Acute idiopathic myocarditis (04/26/2019), Arthritis, Asthma, Chronic migraine headaches (03/13/2008), Chronic rhinitis (03/13/2008), Deviated septum (01/23/2020), Dysesthesia, Dysphagia, oropharyngeal phase (10/21/2009), Dyspnea, Gastroesophageal reflux disease (09/10/2008), Heart murmur, History of colon polyps, History of hiatal hernia, History of neoplasm, HNP (herniated nucleus pulposus), cervical (05/17/2019), Hypokalemia (12/04/2019), Mitral valve prolapse, Multiple sclerosis (1989), Myocarditis (04/25/2019), Neurogenic bladder, Obstructive chronic  bronchitis with exacerbation (09/10/2008), Occipital neuralgia (05/06/2015), Osteoporosis, Pneumonia, PONV (postoperative nausea and vomiting), Pulmonary nodule, right upper lobe (01/22/2009), Short of breath on exertion, Sjogren's disease, Spastic gait, Transverse myelitis, Urinary frequency (02/05/2015), Vitamin D deficiency (12/09/2016), Vocal cord disorder (10/21/2009), and Wears bilateral ankle braces. here with     ICD-10-CM   1. Transverse myelitis ([place])  G37.3     2. Urinary incontinence, unspecified type  R32     3. Attention deficit disorder, unspecified hyperactivity presence  F98.8     4. Muscle spasms of lower extremity, unspecified laterality  M62.838     5. Memory loss  R41.3        Overall Leonna is fairly stable from a neurological perspective. She will continue  baclofen up to TID, lamotrigine '200mg'$  daily. Advised she can increase dose to '200mg'$  in am and '100mg'$  in pm for 1-2 weeks then increase dose to '200mg'$  BID for dysesthesias. She will continue ropirnirol 0.'5mg'$  daily, donepezil '10mg'$  daily, methylphenidate '10mg'$  BID and tamsulosin 0.'4mg'$  daily. Memory compensation strategies reviewed. Fall precautions advised. She will continue close follow-up with primary care and gastroenterology as advised.  She will follow-up with Dr. Felecia Shelling in 6 to 8 months, sooner if needed.  She verbalizes understanding and agreement with this plan.   No orders of the defined types were placed in this encounter.    Meds ordered this encounter  Medications   lamoTRIgine (LAMICTAL) 200 MG tablet    Sig: Take 1 tablet (200 mg total) by mouth 2 (two) times daily.    Dispense:  180 tablet    Refill:  3    Order Specific Question:   Supervising Provider    Answer:   Melvenia Beam [[id number]]   donepezil (ARICEPT) 10 MG tablet    Sig: TAKE 1 TABLET BY MOUTH EVERY NIGHT AT BEDTIME    Dispense:  90 tablet    Refill:  4    Order Specific Question:   Supervising Provider    Answer:   Melvenia Beam [[id number]]   baclofen (LIORESAL) 10 MG tablet    Sig: Take 0.5-1 tablets (5-10 mg total) by mouth 3 (three) times daily as needed.    Dispense:  90 tablet    Refill:  1    Order Specific Question:   Supervising Provider    Answer:   Bess Harvest, FNP-C 12/14/2022, 1:00 PM [facility] [address], [place], [place] [address] [phone]

## 2022-12-14 ENCOUNTER — Ambulatory Visit: Payer: PPO | Admitting: Family Medicine

## 2022-12-14 ENCOUNTER — Encounter: Payer: Self-pay | Admitting: Family Medicine

## 2022-12-14 VITALS — BP 140/77 | HR 85 | Ht 70.0 in | Wt 142.5 lb

## 2022-12-14 DIAGNOSIS — F988 Other specified behavioral and emotional disorders with onset usually occurring in childhood and adolescence: Secondary | ICD-10-CM

## 2022-12-14 DIAGNOSIS — R413 Other amnesia: Secondary | ICD-10-CM | POA: Diagnosis not present

## 2022-12-14 DIAGNOSIS — R32 Unspecified urinary incontinence: Secondary | ICD-10-CM | POA: Diagnosis not present

## 2022-12-14 DIAGNOSIS — G373 Acute transverse myelitis in demyelinating disease of central nervous system: Secondary | ICD-10-CM

## 2022-12-14 DIAGNOSIS — M62838 Other muscle spasm: Secondary | ICD-10-CM

## 2022-12-14 MED ORDER — DONEPEZIL HCL 10 MG PO TABS: ORAL_TABLET | ORAL | 4 refills | Status: DC

## 2022-12-14 MED ORDER — LAMOTRIGINE 200 MG PO TABS: 200.0000 mg | ORAL_TABLET | Freq: Two times a day (BID) | ORAL | 3 refills | Status: DC

## 2022-12-14 MED ORDER — BACLOFEN 10 MG PO TABS: 5.0000 mg | ORAL_TABLET | Freq: Three times a day (TID) | ORAL | 1 refills | Status: DC | PRN

## 2022-12-28 DIAGNOSIS — S129XXD Fracture of neck, unspecified, subsequent encounter: Secondary | ICD-10-CM | POA: Diagnosis not present

## 2022-12-31 ENCOUNTER — Other Ambulatory Visit: Payer: Self-pay | Admitting: Neurosurgery

## 2023-01-07 ENCOUNTER — Other Ambulatory Visit: Payer: Self-pay | Admitting: Neurology

## 2023-01-07 NOTE — Telephone Encounter (Signed)
Pt is calling. Requesting a refill on methylphenidate (RITALIN) 10 MG tablet. Refill should be sent to  [facility] [phone]

## 2023-01-10 MED ORDER — METHYLPHENIDATE HCL 10 MG PO TABS: 10.0000 mg | ORAL_TABLET | Freq: Two times a day (BID) | ORAL | 0 refills | Status: DC

## 2023-01-10 NOTE — Addendum Note (Signed)
Addended by: Wyvonnia Lora on: 01/10/2023 08:43 AM   Modules accepted: Orders

## 2023-01-10 NOTE — Telephone Encounter (Signed)
Last seen 12/14/22 and next f/u 06/21/23. Per drug registry, last refilled 11/28/22 #[id number].

## 2023-01-13 DIAGNOSIS — N1831 Chronic kidney disease, stage 3a: Secondary | ICD-10-CM | POA: Diagnosis not present

## 2023-01-13 DIAGNOSIS — E785 Hyperlipidemia, unspecified: Secondary | ICD-10-CM | POA: Diagnosis not present

## 2023-01-13 DIAGNOSIS — I700 Atherosclerosis of aorta: Secondary | ICD-10-CM | POA: Diagnosis not present

## 2023-01-13 DIAGNOSIS — K219 Gastro-esophageal reflux disease without esophagitis: Secondary | ICD-10-CM | POA: Diagnosis not present

## 2023-01-13 DIAGNOSIS — M810 Age-related osteoporosis without current pathological fracture: Secondary | ICD-10-CM | POA: Diagnosis not present

## 2023-01-13 DIAGNOSIS — I213 ST elevation (STEMI) myocardial infarction of unspecified site: Secondary | ICD-10-CM | POA: Diagnosis not present

## 2023-01-13 DIAGNOSIS — R5383 Other fatigue: Secondary | ICD-10-CM | POA: Diagnosis not present

## 2023-01-13 DIAGNOSIS — R55 Syncope and collapse: Secondary | ICD-10-CM | POA: Diagnosis not present

## 2023-01-13 DIAGNOSIS — I2510 Atherosclerotic heart disease of native coronary artery without angina pectoris: Secondary | ICD-10-CM | POA: Diagnosis not present

## 2023-01-13 DIAGNOSIS — N390 Urinary tract infection, site not specified: Secondary | ICD-10-CM | POA: Diagnosis not present

## 2023-01-13 DIAGNOSIS — I129 Hypertensive chronic kidney disease with stage 1 through stage 4 chronic kidney disease, or unspecified chronic kidney disease: Secondary | ICD-10-CM | POA: Diagnosis not present

## 2023-01-13 DIAGNOSIS — I959 Hypotension, unspecified: Secondary | ICD-10-CM | POA: Diagnosis not present

## 2023-01-26 NOTE — Progress Notes (Signed)
Surgical Instructions    Your procedure is scheduled on Thursday, 02/03/23.  Report to [facility] Main Entrance "A" at 5:30 A.M., then check in with the Admitting office.  Call this number if you have problems the morning of surgery:  [phone]   If you have any questions prior to your surgery date call [phone]: Open Monday-Friday 8am-4pm If you experience any cold or flu symptoms such as cough, fever, chills, shortness of breath, etc. between now and your scheduled surgery, please notify us at the above number     Remember:  Do not eat or drink after midnight the night before your surgery     Take these medicines the morning of surgery with A SIP OF WATER:  Dexlansoprazole  lamoTRIgine (LAMICTAL)  loratadine (CLARITIN)  ZETIA   IF NEEDED: acetaminophen (TYLENOL)   As of today, STOP taking any Aspirin (unless otherwise instructed by your surgeon) Aleve, Naproxen, Ibuprofen, Motrin, Advil, Goody's, BC's, all herbal medications, fish oil, and all vitamins.           Do not wear jewelry or makeup. Do not wear lotions, powders, perfumes or deodorant. Do not shave 48 hours prior to surgery.   Do not bring valuables to the hospital. Do not wear nail polish, gel polish, artificial nails, or any other type of covering on natural nails (fingers and toes) If you have artificial nails or gel coating that need to be removed by a nail salon, please have this removed prior to surgery. Artificial nails or gel coating may interfere with anesthesia's ability to adequately monitor your vital signs.  [facility] is not responsible for any belongings or valuables.    Do NOT Smoke (Tobacco/Vaping)  24 hours prior to your procedure  If you use a CPAP at night, you may bring your mask for your overnight stay.   Contacts, glasses, hearing aids, dentures or partials may not be worn into surgery, please bring cases for these belongings   For patients admitted to the hospital, discharge time  will be determined by your treatment team.   Patients discharged the day of surgery will not be allowed to drive home, and someone needs to stay with them for 24 hours.   SURGICAL WAITING ROOM VISITATION Patients having surgery or a procedure may have no more than 2 support people in the waiting area - these visitors may rotate.   Children under the age of 7 must have an adult with them who is not the patient. If the patient needs to stay at the hospital during part of their recovery, the visitor guidelines for inpatient rooms apply. Pre-op nurse will coordinate an appropriate time for 1 support person to accompany patient in pre-op.  This support person may not rotate.   Please refer to [email or web address] for the visitor guidelines for Inpatients (after your surgery is over and you are in a regular room).    Special instructions:    Oral Hygiene is also important to reduce your risk of infection.  Remember - BRUSH YOUR TEETH THE MORNING OF SURGERY WITH YOUR REGULAR TOOTHPASTE   [place]- Preparing For Surgery  Before surgery, you can play an important role. Because skin is not sterile, your skin needs to be as free of germs as possible. You can reduce the number of germs on your skin by washing with CHG (chlorahexidine gluconate) Soap before surgery.  CHG is an antiseptic cleaner which kills germs and bonds with the skin to continue killing germs even after washing.  Please do not use if you have an allergy to CHG or antibacterial soaps. If your skin becomes reddened/irritated stop using the CHG.  Do not shave (including legs and underarms) for at least 48 hours prior to first CHG shower. It is OK to shave your face.  Please follow these instructions carefully.     Shower the NIGHT BEFORE SURGERY and the MORNING OF SURGERY with CHG Soap.   If you chose to wash your hair, wash your hair first as usual with your normal shampoo.  After you shampoo, rinse your hair and body thoroughly to remove the shampoo.  Then [employer] and genitals (private parts) with your normal soap and rinse thoroughly to remove soap.  After that Use CHG Soap as you would any other liquid soap. You can apply CHG directly to the skin and wash gently with a scrungie or a clean washcloth.   Apply the CHG Soap to your body ONLY FROM THE NECK DOWN.  Do not use on open wounds or open sores. Avoid contact with your eyes, ears, mouth and genitals (private parts). Wash Face and genitals (private parts)  with your normal soap.   Wash thoroughly, paying special attention to the area where your surgery will be performed.  Thoroughly rinse your body with warm water from the neck down.  DO NOT shower/wash with your normal soap after using and rinsing off the CHG Soap.  Pat yourself dry with a CLEAN TOWEL.  Wear CLEAN PAJAMAS to bed the night before surgery  Place CLEAN SHEETS on your bed the night before your surgery  DO NOT SLEEP WITH PETS.   Day of Surgery: Take a shower with CHG soap. Wear Clean/Comfortable clothing the morning of surgery Do not apply any deodorants/lotions.   Remember to brush your teeth WITH YOUR REGULAR TOOTHPASTE.    If you received a COVID test during your pre-op visit, it is requested that you wear a mask when out in public, stay away from anyone that may not be feeling well, and notify your surgeon if you develop symptoms. If you have been in contact with anyone that has tested positive in the last 10 days, please notify your surgeon.    Please read over the following fact sheets that you were given.

## 2023-01-28 ENCOUNTER — Other Ambulatory Visit: Payer: Self-pay

## 2023-01-28 ENCOUNTER — Encounter (HOSPITAL_COMMUNITY)
Admission: RE | Admit: 2023-01-28 | Discharge: 2023-01-28 | Disposition: A | Discharge status: Home / Self Care | Payer: PPO | Source: Ambulatory Visit | Attending: Neurosurgery | Admitting: Neurosurgery

## 2023-01-28 ENCOUNTER — Encounter (HOSPITAL_COMMUNITY): Payer: Self-pay

## 2023-01-28 VITALS — BP 107/71 | HR 79 | Temp 97.7°F | Resp 18 | Ht 70.0 in | Wt 138.6 lb

## 2023-01-28 DIAGNOSIS — Z01818 Encounter for other preprocedural examination: Secondary | ICD-10-CM | POA: Insufficient documentation

## 2023-01-28 LAB — CBC
HCT: 44.3 % (ref 36.0–46.0)
Hemoglobin: 14.5 g/dL (ref 12.0–15.0)
MCH: 30.7 pg (ref 26.0–34.0)
MCHC: 32.7 g/dL (ref 30.0–36.0)
MCV: 93.7 fL (ref 80.0–100.0)
Platelets: 214 10*3/uL (ref 150–400)
RBC: 4.73 MIL/uL (ref 3.87–5.11)
RDW: 13.2 % (ref 11.5–15.5)
WBC: 5.6 10*3/uL (ref 4.0–10.5)
nRBC: 0 % (ref 0.0–0.2)

## 2023-01-28 LAB — SURGICAL PCR SCREEN
MRSA, PCR: NEGATIVE
Staphylococcus aureus: NEGATIVE

## 2023-01-28 LAB — BASIC METABOLIC PANEL
Anion gap: 7 (ref 5–15)
BUN: 11 mg/dL (ref 8–23)
CO2: 30 mmol/L (ref 22–32)
Calcium: 9.1 mg/dL (ref 8.9–10.3)
Chloride: 103 mmol/L (ref 98–111)
Creatinine, Ser: 0.93 mg/dL (ref 0.44–1.00)
GFR, Estimated: >60 mL/min (ref >60)
Glucose, Bld: 89 mg/dL (ref 70–99)
Potassium: 4.1 mmol/L (ref 3.5–5.1)
Sodium: 140 mmol/L (ref 135–145)

## 2023-01-28 LAB — TYPE AND SCREEN
ABO/RH(D): O NEG
Antibody Screen: NEGATIVE

## 2023-01-28 NOTE — Progress Notes (Addendum)
PCP - Dr. Crist Infante Cardiologist - Dr. Adrian Prows  PPM/ICD - n/a Device Orders - n/a Rep Notified - n/a  Chest x-ray - n/a EKG - 01/28/23 Stress Test -  ECHO - 12/03/21 Cardiac Cath - 03/2019  Sleep Study - denies CPAP - n/a  Fasting Blood Sugar - n/a Checks Blood Sugar _____ times a day- n/a  Last dose of GLP1 agonist- n/a GLP1 instructions: n/a  Blood Thinner Instructions: n/a Aspirin Instructions: n/a  ERAS Protcol - NPO PRE-SURGERY Ensure or G2- n/a  COVID TEST- n/a   Anesthesia review: Yes. Cardiac History. Has a stimulator to control bowel and bladder incontinence.   Patient denies shortness of breath, fever, cough and chest pain at PAT appointment   All instructions explained to the patient, with a verbal understanding of the material. Patient agrees to go over the instructions while at home for a better understanding. The opportunity to ask questions was provided.

## 2023-01-31 NOTE — Anesthesia Preprocedure Evaluation (Signed)
Anesthesia Evaluation  Patient identified by MRN, date of birth, ID band Patient awake    Reviewed: Allergy & Precautions, NPO status , Patient's Chart, lab work & pertinent test results  History of Anesthesia Complications (+) PONV and history of anesthetic complications  Airway Mallampati: I  TM Distance: >3 FB Neck ROM: Full    Dental no notable dental hx. (+) Dental Advisory Given   Pulmonary shortness of breath, asthma , pneumonia, COPD RUL pulmonary nodule   Pulmonary exam normal breath sounds clear to auscultation       Cardiovascular (-) angina Normal cardiovascular exam(-) Valvular Problems/Murmurs Rhythm:Regular Rate:Normal  Echocardiogram 12/03/2021:  Left ventricle cavity is normal in size and wall thickness. Normal global  wall motion. Normal LV systolic function with EF 65%. Normal diastolic  filling pattern, normal LAP.  Trileaflet aortic valve. Mild aortic valve leaflet calcification. Mild  (Grade I) aortic regurgitation.  Mild to moderate tricuspid regurgitation. Estimated pulmonary artery  systolic pressure 25 mmHg.     Neuro/Psych  Headaches    GI/Hepatic Neg liver ROS, hiatal hernia,GERD  ,,  Endo/Other  negative endocrine ROS    Renal/GU negative Renal ROS     Musculoskeletal  (+) Arthritis ,    Abdominal   Peds  Hematology negative hematology ROS (+)   Anesthesia Other Findings L neck hematoma s/p ACDF this morning: large, firm, pt dyspneic sitting upright  Reproductive/Obstetrics                             Anesthesia Physical Anesthesia Plan  ASA: 3  Anesthesia Plan: General   Post-op Pain Management: Tylenol PO (pre-op)*   Induction: Intravenous  PONV Risk Score and Plan: 4 or greater and Ondansetron, Treatment may vary due to age or medical condition and Dexamethasone  Airway Management Planned: Oral ETT and Video Laryngoscope Planned  Additional  Equipment: ClearSight  Intra-op Plan:   Post-operative Plan: Extubation in OR  Informed Consent: I have reviewed the patients History and Physical, chart, labs and discussed the procedure including the risks, benefits and alternatives for the proposed anesthesia with the patient or authorized representative who has indicated his/her understanding and acceptance.     Dental advisory given  Plan Discussed with: CRNA  Anesthesia Plan Comments: (PAT note by Jill Caldwell, PA-C: Follows with neurology for history of multiple sclerosis and T4 transverse myelitis.  Last seen by Jill Presto, NP on 12/14/2022.  Per note, "Overall [name] is fairly stable from a neurological perspective.She will continue baclofen up to TID, lamotrigine '200mg'$  daily. Advised she can increase dose to '200mg'$  in am and '100mg'$  in pm for 1-2 weeks then increase dose to '200mg'$  BID for dysesthesias. She will continue ropirnirol 0.'5mg'$  daily, donepezil '10mg'$  daily, methylphenidate '10mg'$  BID and tamsulosin 0.'4mg'$  daily."  History of cardiac admission in April 2020 for "STEMI" with cath revealing normal coronary arteries and mid anterior hypokinesis of the left ventricle, suggestive of either Takotsubo or myopericarditis.  Repeat echo had complete normalization of LVEF in 2020.  She was last seen by Dr. Einar Gip 11/25/2021.  Follow-up echo at that time showed normal wall motion, EF 65%, mild aortic regurgitation.  At that time she was recommended to follow-up with cardiology on an as-needed basis.  History of C4-7 ACDF.  Preop labs reviewed, WNL.  EKG 01/28/2023: NSR.  Rate 62.  TTE 12/03/2021: Left ventricle cavity is normal in size and wall thickness. Normal global  wall motion. Normal LV  systolic function with EF 65%. Normal diastolic  filling pattern, normal LAP.  Trileaflet aortic valve. Mild aortic valve leaflet calcification. Mild  (Grade I) aortic regurgitation.  Mild to moderate tricuspid regurgitation. Estimated pulmonary artery   systolic pressure 25 mmHg.   Cardiac cath 04/25/2019 (Care Everywhere): 1. Normal coronary arteriogram.  2. Abnormal Left Ventriculogram with mid-chamber hypokinesis. This pattern can  be seen with Stress Cardiomyopathy ("mid-chamber Takotsubo"), or  myopericarditis.  Diagnostic Procedure Recommendations  Continue evaluation for dyspnea.  Echocardiogram.  There is no need for anticoagulation or antiplatelettherapy for a cardiac  indication.  Start carvedilol and lisinopril to promote myocardial recovery.    )        Anesthesia Quick Evaluation

## 2023-01-31 NOTE — Progress Notes (Signed)
Anesthesia Chart Review: Same day workup  Follows with neurology for history of multiple sclerosis and T4 transverse myelitis.  Last seen by Debbora Presto, NP on 12/14/2022.  Per note, "Overall [name] is fairly stable from a neurological perspective. She will continue baclofen up to TID, lamotrigine '200mg'$  daily. Advised she can increase dose to '200mg'$  in am and '100mg'$  in pm for 1-2 weeks then increase dose to '200mg'$  BID for dysesthesias. She will continue ropirnirol 0.'5mg'$  daily, donepezil '10mg'$  daily, methylphenidate '10mg'$  BID and tamsulosin 0.'4mg'$  daily."  History of cardiac admission in April 2020 for "STEMI" with cath revealing normal coronary arteries and mid anterior hypokinesis of the left ventricle, suggestive of either Takotsubo or myopericarditis.  Repeat echo had complete normalization of LVEF in 2020.  She was last seen by Dr. Einar Gip 11/25/2021.  Follow-up echo at that time showed normal wall motion, EF 65%, mild aortic regurgitation.  At that time she was recommended to follow-up with cardiology on an as-needed basis.  History of C4-7 ACDF.  Preop labs reviewed, WNL.  EKG 01/28/2023: NSR.  Rate 62.  TTE 12/03/2021: Left ventricle cavity is normal in size and wall thickness. Normal global  wall motion. Normal LV systolic function with EF 65%. Normal diastolic  filling pattern, normal LAP.  Trileaflet aortic valve. Mild aortic valve leaflet calcification. Mild  (Grade I) aortic regurgitation.  Mild to moderate tricuspid regurgitation. Estimated pulmonary artery  systolic pressure 25 mmHg.   Cardiac cath 04/25/2019 (Care Everywhere): 1. Normal coronary arteriogram.  2. Abnormal Left Ventriculogram with mid-chamber hypokinesis. This pattern can  be seen with Stress Cardiomyopathy ("mid-chamber Takotsubo"), or  myopericarditis.  Diagnostic Procedure Recommendations  Continue evaluation for dyspnea.  Echocardiogram.  There is no need for anticoagulation or antiplatelet therapy for a cardiac   indication.  Start carvedilol and lisinopril to promote myocardial recovery.     Wynonia Musty [facility] Short Stay Center/Anesthesiology Phone [phone] 01/31/2023 12:50 PM

## 2023-02-02 ENCOUNTER — Other Ambulatory Visit: Payer: Self-pay | Admitting: Neurosurgery

## 2023-02-03 ENCOUNTER — Inpatient Hospital Stay (HOSPITAL_COMMUNITY): Payer: PPO | Admitting: Physician Assistant

## 2023-02-03 ENCOUNTER — Encounter (HOSPITAL_COMMUNITY): Payer: Self-pay | Admitting: Neurosurgery

## 2023-02-03 ENCOUNTER — Inpatient Hospital Stay (HOSPITAL_COMMUNITY): Payer: PPO

## 2023-02-03 ENCOUNTER — Other Ambulatory Visit: Payer: Self-pay

## 2023-02-03 ENCOUNTER — Inpatient Hospital Stay (HOSPITAL_COMMUNITY)
Admission: RE | Admit: 2023-02-03 | Discharge: 2023-02-04 | DRG: 473 | Disposition: A | Discharge status: Home / Self Care | Payer: PPO | Attending: Neurosurgery | Admitting: Neurosurgery

## 2023-02-03 ENCOUNTER — Encounter (HOSPITAL_COMMUNITY)
Admission: RE | Disposition: A | Discharge status: Home / Self Care | Payer: Self-pay | Source: Home / Self Care | Attending: Neurosurgery

## 2023-02-03 DIAGNOSIS — K219 Gastro-esophageal reflux disease without esophagitis: Secondary | ICD-10-CM | POA: Diagnosis present

## 2023-02-03 DIAGNOSIS — Z803 Family history of malignant neoplasm of breast: Secondary | ICD-10-CM | POA: Diagnosis not present

## 2023-02-03 DIAGNOSIS — J189 Pneumonia, unspecified organism: Secondary | ICD-10-CM | POA: Diagnosis not present

## 2023-02-03 DIAGNOSIS — Z823 Family history of stroke: Secondary | ICD-10-CM | POA: Diagnosis not present

## 2023-02-03 DIAGNOSIS — Z801 Family history of malignant neoplasm of trachea, bronchus and lung: Secondary | ICD-10-CM

## 2023-02-03 DIAGNOSIS — N319 Neuromuscular dysfunction of bladder, unspecified: Secondary | ICD-10-CM | POA: Diagnosis present

## 2023-02-03 DIAGNOSIS — K449 Diaphragmatic hernia without obstruction or gangrene: Secondary | ICD-10-CM | POA: Diagnosis not present

## 2023-02-03 DIAGNOSIS — M3500 Sicca syndrome, unspecified: Secondary | ICD-10-CM | POA: Diagnosis not present

## 2023-02-03 DIAGNOSIS — S129XXA Fracture of neck, unspecified, initial encounter: Principal | ICD-10-CM | POA: Diagnosis present

## 2023-02-03 DIAGNOSIS — Z9049 Acquired absence of other specified parts of digestive tract: Secondary | ICD-10-CM

## 2023-02-03 DIAGNOSIS — M960 Pseudarthrosis after fusion or arthrodesis: Secondary | ICD-10-CM | POA: Diagnosis not present

## 2023-02-03 DIAGNOSIS — Y838 Other surgical procedures as the cause of abnormal reaction of the patient, or of later complication, without mention of misadventure at the time of the procedure: Secondary | ICD-10-CM | POA: Diagnosis present

## 2023-02-03 DIAGNOSIS — G35 Multiple sclerosis: Secondary | ICD-10-CM | POA: Diagnosis not present

## 2023-02-03 DIAGNOSIS — M542 Cervicalgia: Secondary | ICD-10-CM | POA: Diagnosis not present

## 2023-02-03 DIAGNOSIS — J449 Chronic obstructive pulmonary disease, unspecified: Secondary | ICD-10-CM | POA: Diagnosis not present

## 2023-02-03 DIAGNOSIS — M1990 Unspecified osteoarthritis, unspecified site: Secondary | ICD-10-CM | POA: Diagnosis not present

## 2023-02-03 DIAGNOSIS — M810 Age-related osteoporosis without current pathological fracture: Secondary | ICD-10-CM | POA: Diagnosis not present

## 2023-02-03 DIAGNOSIS — Z8249 Family history of ischemic heart disease and other diseases of the circulatory system: Secondary | ICD-10-CM

## 2023-02-03 DIAGNOSIS — Z8719 Personal history of other diseases of the digestive system: Secondary | ICD-10-CM | POA: Diagnosis not present

## 2023-02-03 DIAGNOSIS — M4322 Fusion of spine, cervical region: Secondary | ICD-10-CM | POA: Diagnosis not present

## 2023-02-03 HISTORY — PX: POSTERIOR CERVICAL FUSION/FORAMINOTOMY: SHX5038

## 2023-02-03 SURGERY — POSTERIOR CERVICAL FUSION/FORAMINOTOMY LEVEL 2
Anesthesia: General

## 2023-02-03 MED ORDER — DONEPEZIL HCL 10 MG PO TABS
10.0000 mg | ORAL_TABLET | Freq: Every day | ORAL | Status: DC
  Administered 2023-02-03: 10 mg via ORAL
  Filled 2023-02-03: qty 1

## 2023-02-03 MED ORDER — OXYCODONE HCL 5 MG PO TABS
5.0000 mg | ORAL_TABLET | ORAL | Status: DC | PRN
  Administered 2023-02-03: 5 mg via ORAL
  Filled 2023-02-03 (×2): qty 1

## 2023-02-03 MED ORDER — LIDOCAINE-EPINEPHRINE 1 %-1:100000 IJ SOLN
INTRAMUSCULAR | Status: DC | PRN
  Administered 2023-02-03: 10 mL

## 2023-02-03 MED ORDER — LACTATED RINGERS IV SOLN: INTRAVENOUS | Status: DC | PRN

## 2023-02-03 MED ORDER — DEXAMETHASONE SODIUM PHOSPHATE 10 MG/ML IJ SOLN
INTRAMUSCULAR | Status: AC
  Filled 2023-02-03: qty 1

## 2023-02-03 MED ORDER — FENTANYL CITRATE (PF) 250 MCG/5ML IJ SOLN
INTRAMUSCULAR | Status: AC
  Filled 2023-02-03: qty 5

## 2023-02-03 MED ORDER — PHENYLEPHRINE HCL-NACL 20-0.9 MG/250ML-% IV SOLN
INTRAVENOUS | Status: DC | PRN
  Administered 2023-02-03: 15 ug/min via INTRAVENOUS

## 2023-02-03 MED ORDER — FENTANYL CITRATE (PF) 250 MCG/5ML IJ SOLN
INTRAMUSCULAR | Status: DC | PRN
  Administered 2023-02-03: 50 ug via INTRAVENOUS
  Administered 2023-02-03: 100 ug via INTRAVENOUS

## 2023-02-03 MED ORDER — PROPOFOL 10 MG/ML IV BOLUS
INTRAVENOUS | Status: AC
  Filled 2023-02-03: qty 20

## 2023-02-03 MED ORDER — DOCUSATE SODIUM 100 MG PO CAPS
100.0000 mg | ORAL_CAPSULE | Freq: Two times a day (BID) | ORAL | Status: DC
  Administered 2023-02-03: 100 mg via ORAL
  Filled 2023-02-03: qty 1

## 2023-02-03 MED ORDER — 0.9 % SODIUM CHLORIDE (POUR BTL) OPTIME
TOPICAL | Status: DC | PRN
  Administered 2023-02-03: 1000 mL

## 2023-02-03 MED ORDER — MIDAZOLAM HCL 2 MG/2ML IJ SOLN
INTRAMUSCULAR | Status: AC
  Filled 2023-02-03: qty 2

## 2023-02-03 MED ORDER — BUPIVACAINE HCL (PF) 0.5 % IJ SOLN
INTRAMUSCULAR | Status: AC
  Filled 2023-02-03: qty 30

## 2023-02-03 MED ORDER — ONDANSETRON HCL 4 MG PO TABS: 4.0000 mg | ORAL_TABLET | Freq: Four times a day (QID) | ORAL | Status: DC | PRN

## 2023-02-03 MED ORDER — ACETAMINOPHEN 500 MG PO TABS
1000.0000 mg | ORAL_TABLET | Freq: Four times a day (QID) | ORAL | Status: AC
  Administered 2023-02-03 – 2023-02-04 (×4): 1000 mg via ORAL
  Filled 2023-02-03 (×4): qty 2

## 2023-02-03 MED ORDER — ONDANSETRON HCL 4 MG/2ML IJ SOLN
INTRAMUSCULAR | Status: DC | PRN
  Administered 2023-02-03: 4 mg via INTRAVENOUS

## 2023-02-03 MED ORDER — BACITRACIN ZINC 500 UNIT/GM EX OINT
TOPICAL_OINTMENT | CUTANEOUS | Status: DC | PRN
  Administered 2023-02-03: 1 via TOPICAL

## 2023-02-03 MED ORDER — CHLORHEXIDINE GLUCONATE CLOTH 2 % EX PADS: 6.0000 | MEDICATED_PAD | Freq: Once | CUTANEOUS | Status: DC

## 2023-02-03 MED ORDER — ACETAMINOPHEN 500 MG PO TABS
1000.0000 mg | ORAL_TABLET | Freq: Once | ORAL | Status: AC
  Administered 2023-02-03: 1000 mg via ORAL
  Filled 2023-02-03: qty 2

## 2023-02-03 MED ORDER — ROCURONIUM BROMIDE 10 MG/ML (PF) SYRINGE
PREFILLED_SYRINGE | INTRAVENOUS | Status: DC | PRN
  Administered 2023-02-03: 20 mg via INTRAVENOUS
  Administered 2023-02-03: 40 mg via INTRAVENOUS
  Administered 2023-02-03: 20 mg via INTRAVENOUS

## 2023-02-03 MED ORDER — CYCLOBENZAPRINE HCL 10 MG PO TABS: 10.0000 mg | ORAL_TABLET | Freq: Three times a day (TID) | ORAL | Status: DC | PRN

## 2023-02-03 MED ORDER — METHYLPHENIDATE HCL 5 MG PO TABS: 10.0000 mg | ORAL_TABLET | Freq: Two times a day (BID) | ORAL | Status: DC

## 2023-02-03 MED ORDER — BUPIVACAINE LIPOSOME 1.3 % IJ SUSP
INTRAMUSCULAR | Status: DC | PRN
  Administered 2023-02-03: 20 mL

## 2023-02-03 MED ORDER — LACTATED RINGERS IV SOLN: INTRAVENOUS | Status: DC

## 2023-02-03 MED ORDER — ZOLPIDEM TARTRATE 5 MG PO TABS: 5.0000 mg | ORAL_TABLET | Freq: Every evening | ORAL | Status: DC | PRN

## 2023-02-03 MED ORDER — TAMSULOSIN HCL 0.4 MG PO CAPS: 0.4000 mg | ORAL_CAPSULE | Freq: Every day | ORAL | Status: DC

## 2023-02-03 MED ORDER — BUPIVACAINE LIPOSOME 1.3 % IJ SUSP
INTRAMUSCULAR | Status: AC
  Filled 2023-02-03: qty 20

## 2023-02-03 MED ORDER — LIDOCAINE 2% (20 MG/ML) 5 ML SYRINGE
INTRAMUSCULAR | Status: AC
  Filled 2023-02-03: qty 5

## 2023-02-03 MED ORDER — LIDOCAINE 2% (20 MG/ML) 5 ML SYRINGE
INTRAMUSCULAR | Status: DC | PRN
  Administered 2023-02-03: 50 mg via INTRAVENOUS

## 2023-02-03 MED ORDER — BACLOFEN 10 MG PO TABS
10.0000 mg | ORAL_TABLET | Freq: Every day | ORAL | Status: DC
  Administered 2023-02-03: 10 mg via ORAL
  Filled 2023-02-03: qty 1

## 2023-02-03 MED ORDER — ALUM & MAG HYDROXIDE-SIMETH 200-200-20 MG/5ML PO SUSP: 30.0000 mL | Freq: Four times a day (QID) | ORAL | Status: DC | PRN

## 2023-02-03 MED ORDER — THROMBIN 5000 UNITS EX SOLR
CUTANEOUS | Status: AC
  Filled 2023-02-03: qty 5000

## 2023-02-03 MED ORDER — ARTIFICIAL TEARS OPHTHALMIC OINT
TOPICAL_OINTMENT | OPHTHALMIC | Status: DC | PRN
  Administered 2023-02-03: 1 via OPHTHALMIC

## 2023-02-03 MED ORDER — ACETAMINOPHEN 325 MG PO TABS: 650.0000 mg | ORAL_TABLET | ORAL | Status: DC | PRN

## 2023-02-03 MED ORDER — ARTIFICIAL TEARS OPHTHALMIC OINT
TOPICAL_OINTMENT | OPHTHALMIC | Status: AC
  Filled 2023-02-03: qty 3.5

## 2023-02-03 MED ORDER — HYDROMORPHONE HCL 1 MG/ML IJ SOLN
INTRAMUSCULAR | Status: AC
  Filled 2023-02-03: qty 1

## 2023-02-03 MED ORDER — BISACODYL 10 MG RE SUPP: 10.0000 mg | Freq: Every day | RECTAL | Status: DC | PRN

## 2023-02-03 MED ORDER — DEXAMETHASONE SODIUM PHOSPHATE 10 MG/ML IJ SOLN
INTRAMUSCULAR | Status: DC | PRN
  Administered 2023-02-03: 10 mg via INTRAVENOUS

## 2023-02-03 MED ORDER — PROPOFOL 10 MG/ML IV BOLUS
INTRAVENOUS | Status: DC | PRN
  Administered 2023-02-03: 50 mg via INTRAVENOUS
  Administered 2023-02-03: 30 mg via INTRAVENOUS
  Administered 2023-02-03: 80 mg via INTRAVENOUS

## 2023-02-03 MED ORDER — PHENOL 1.4 % MT LIQD: 1.0000 | OROMUCOSAL | Status: DC | PRN

## 2023-02-03 MED ORDER — PANTOPRAZOLE SODIUM 40 MG PO TBEC
40.0000 mg | DELAYED_RELEASE_TABLET | Freq: Every day | ORAL | Status: DC
  Administered 2023-02-03: 40 mg via ORAL
  Filled 2023-02-03: qty 1

## 2023-02-03 MED ORDER — HYDROMORPHONE HCL 1 MG/ML IJ SOLN: 0.5000 mg | INTRAMUSCULAR | Status: DC | PRN

## 2023-02-03 MED ORDER — HYDROMORPHONE HCL 1 MG/ML IJ SOLN
0.2500 mg | INTRAMUSCULAR | Status: DC | PRN
  Administered 2023-02-03 (×2): 0.5 mg via INTRAVENOUS

## 2023-02-03 MED ORDER — CEFAZOLIN SODIUM-DEXTROSE 2-4 GM/100ML-% IV SOLN
2.0000 g | INTRAVENOUS | Status: AC
  Administered 2023-02-03: 2 g via INTRAVENOUS
  Filled 2023-02-03: qty 100

## 2023-02-03 MED ORDER — SUGAMMADEX SODIUM 200 MG/2ML IV SOLN
INTRAVENOUS | Status: DC | PRN
  Administered 2023-02-03: 124.6 mg via INTRAVENOUS

## 2023-02-03 MED ORDER — LAMOTRIGINE 100 MG PO TABS
200.0000 mg | ORAL_TABLET | Freq: Two times a day (BID) | ORAL | Status: DC
  Filled 2023-02-03 (×2): qty 2

## 2023-02-03 MED ORDER — OXYCODONE HCL 5 MG PO TABS: 10.0000 mg | ORAL_TABLET | ORAL | Status: DC | PRN

## 2023-02-03 MED ORDER — ROPINIROLE HCL 1 MG PO TABS
0.5000 mg | ORAL_TABLET | Freq: Every day | ORAL | Status: DC
  Administered 2023-02-03: 0.5 mg via ORAL
  Filled 2023-02-03: qty 1

## 2023-02-03 MED ORDER — ONDANSETRON HCL 4 MG/2ML IJ SOLN
INTRAMUSCULAR | Status: AC
  Filled 2023-02-03: qty 2

## 2023-02-03 MED ORDER — THROMBIN 5000 UNITS EX SOLR: OROMUCOSAL | Status: DC | PRN

## 2023-02-03 MED ORDER — MENTHOL 3 MG MT LOZG: 1.0000 | LOZENGE | OROMUCOSAL | Status: DC | PRN

## 2023-02-03 MED ORDER — LORATADINE 10 MG PO TABS: 10.0000 mg | ORAL_TABLET | Freq: Every day | ORAL | Status: DC

## 2023-02-03 MED ORDER — ORAL CARE MOUTH RINSE: 15.0000 mL | Freq: Once | OROMUCOSAL | Status: AC

## 2023-02-03 MED ORDER — CEFAZOLIN SODIUM-DEXTROSE 2-4 GM/100ML-% IV SOLN
2.0000 g | Freq: Three times a day (TID) | INTRAVENOUS | Status: AC
  Administered 2023-02-03 (×2): 2 g via INTRAVENOUS
  Filled 2023-02-03 (×2): qty 100

## 2023-02-03 MED ORDER — CHLORHEXIDINE GLUCONATE 0.12 % MT SOLN
15.0000 mL | Freq: Once | OROMUCOSAL | Status: AC
  Administered 2023-02-03: 15 mL via OROMUCOSAL
  Filled 2023-02-03: qty 15

## 2023-02-03 MED ORDER — PHENYLEPHRINE HCL (PRESSORS) 10 MG/ML IV SOLN
INTRAVENOUS | Status: DC | PRN
  Administered 2023-02-03 (×2): 80 ug via INTRAVENOUS
  Administered 2023-02-03 (×2): 160 ug via INTRAVENOUS
  Administered 2023-02-03: 80 ug via INTRAVENOUS

## 2023-02-03 MED ORDER — ONDANSETRON HCL 4 MG/2ML IJ SOLN: 4.0000 mg | Freq: Four times a day (QID) | INTRAMUSCULAR | Status: DC | PRN

## 2023-02-03 MED ORDER — BACITRACIN ZINC 500 UNIT/GM EX OINT
TOPICAL_OINTMENT | CUTANEOUS | Status: AC
  Filled 2023-02-03: qty 28.35

## 2023-02-03 MED ORDER — ROCURONIUM BROMIDE 10 MG/ML (PF) SYRINGE
PREFILLED_SYRINGE | INTRAVENOUS | Status: AC
  Filled 2023-02-03: qty 10

## 2023-02-03 MED ORDER — EZETIMIBE 10 MG PO TABS
10.0000 mg | ORAL_TABLET | Freq: Every day | ORAL | Status: DC
  Administered 2023-02-03 (×2): 10 mg via ORAL
  Filled 2023-02-03 (×2): qty 1

## 2023-02-03 MED ORDER — LIDOCAINE-EPINEPHRINE 1 %-1:100000 IJ SOLN
INTRAMUSCULAR | Status: AC
  Filled 2023-02-03: qty 1

## 2023-02-03 MED ORDER — AMISULPRIDE (ANTIEMETIC) 5 MG/2ML IV SOLN: 10.0000 mg | Freq: Once | INTRAVENOUS | Status: DC | PRN

## 2023-02-03 MED ORDER — ACETAMINOPHEN 650 MG RE SUPP: 650.0000 mg | RECTAL | Status: DC | PRN

## 2023-02-03 SURGICAL SUPPLY — 54 items
BAG COUNTER SPONGE SURGICOUNT (BAG) ×1 IMPLANT
BAND RUBBER #18 3X1/16 STRL (MISCELLANEOUS) IMPLANT
BENZOIN TINCTURE PRP APPL 2/3 (GAUZE/BANDAGES/DRESSINGS) ×1 IMPLANT
BIT DRILL NEURO 2X3.1 SFT TUCH (MISCELLANEOUS) ×1 IMPLANT
BLADE CLIPPER SURG (BLADE) IMPLANT
BLADE ULTRA TIP 2M (BLADE) IMPLANT
CANISTER SUCT 3000ML PPV (MISCELLANEOUS) ×1 IMPLANT
CAP CLSR POST CERV (Cap) IMPLANT
DRAPE C-ARM 42X72 X-RAY (DRAPES) ×2 IMPLANT
DRAPE LAPAROTOMY 100X72 PEDS (DRAPES) ×1 IMPLANT
DRAPE MICROSCOPE SLANT 54X150 (MISCELLANEOUS) IMPLANT
DRAPE SURG 17X23 STRL (DRAPES) ×3 IMPLANT
DRILL NEURO 2X3.1 SOFT TOUCH (MISCELLANEOUS) ×2
DRSG OPSITE POSTOP 4X6 (GAUZE/BANDAGES/DRESSINGS) IMPLANT
ELECT BLADE 4.0 EZ CLEAN MEGAD (MISCELLANEOUS) ×1
ELECT REM PT RETURN 9FT ADLT (ELECTROSURGICAL) ×1
ELECTRODE BLDE 4.0 EZ CLN MEGD (MISCELLANEOUS) ×1 IMPLANT
ELECTRODE REM PT RTRN 9FT ADLT (ELECTROSURGICAL) ×1 IMPLANT
GAUZE 4X4 16PLY ~~LOC~~+RFID DBL (SPONGE) IMPLANT
GAUZE SPONGE 4X4 12PLY STRL (GAUZE/BANDAGES/DRESSINGS) ×1 IMPLANT
GLOVE BIO SURGEON STRL SZ8 (GLOVE) ×1 IMPLANT
GLOVE BIO SURGEON STRL SZ8.5 (GLOVE) ×1 IMPLANT
GLOVE EXAM NITRILE XL STR (GLOVE) IMPLANT
GOWN STRL REUS W/ TWL LRG LVL3 (GOWN DISPOSABLE) IMPLANT
GOWN STRL REUS W/ TWL XL LVL3 (GOWN DISPOSABLE) ×1 IMPLANT
GOWN STRL REUS W/TWL 2XL LVL3 (GOWN DISPOSABLE) IMPLANT
GOWN STRL REUS W/TWL LRG LVL3 (GOWN DISPOSABLE)
GOWN STRL REUS W/TWL XL LVL3 (GOWN DISPOSABLE) ×1
HEMOSTAT POWDER KIT SURGIFOAM (HEMOSTASIS) ×1 IMPLANT
KIT BASIN OR (CUSTOM PROCEDURE TRAY) ×1 IMPLANT
KIT TURNOVER KIT B (KITS) ×1 IMPLANT
NDL SPNL 18GX3.5 QUINCKE PK (NEEDLE) ×1 IMPLANT
NEEDLE HYPO 22GX1.5 SAFETY (NEEDLE) ×1 IMPLANT
NEEDLE SPNL 18GX3.5 QUINCKE PK (NEEDLE) ×1 IMPLANT
NS IRRIG 1000ML POUR BTL (IV SOLUTION) ×1 IMPLANT
PACK LAMINECTOMY NEURO (CUSTOM PROCEDURE TRAY) ×1 IMPLANT
PAD ARMBOARD 7.5X6 YLW CONV (MISCELLANEOUS) ×3 IMPLANT
PIN MAYFIELD SKULL DISP (PIN) ×1 IMPLANT
PUTTY DBM 5CC CALC GRAN (Putty) IMPLANT
ROD VIRAGE 3.5X40 (Rod) IMPLANT
SCREW VIRAGE 3.5X14 (Screw) IMPLANT
SPONGE SURGIFOAM ABS GEL 100 (HEMOSTASIS) IMPLANT
SPONGE T-LAP 4X18 ~~LOC~~+RFID (SPONGE) IMPLANT
STAPLER SKIN PROX WIDE 3.9 (STAPLE) IMPLANT
STRIP CLOSURE SKIN 1/2X4 (GAUZE/BANDAGES/DRESSINGS) ×1 IMPLANT
SUT ETHILON 2 0 FS 18 (SUTURE) IMPLANT
SUT VIC AB 0 CT1 18XCR BRD8 (SUTURE) ×1 IMPLANT
SUT VIC AB 0 CT1 8-18 (SUTURE) ×1
SUT VIC AB 2-0 CP2 18 (SUTURE) ×1 IMPLANT
TOWEL GREEN STERILE (TOWEL DISPOSABLE) ×1 IMPLANT
TOWEL GREEN STERILE FF (TOWEL DISPOSABLE) ×1 IMPLANT
TRAY FOLEY MTR SLVR 16FR STAT (SET/KITS/TRAYS/PACK) IMPLANT
UNDERPAD 30X36 HEAVY ABSORB (UNDERPADS AND DIAPERS) IMPLANT
WATER STERILE IRR 1000ML POUR (IV SOLUTION) ×1 IMPLANT

## 2023-02-03 NOTE — Op Note (Signed)
Brief history: Patient is a 72 year old female white female who has had a C5-6 and C6-7 anterior cervicectomy fusion plating but another physician years ago.  She has developed chronic and worsening neck pain.  She was worked up with x-rays which demonstrated findings consistent with a cervical pseudoarthrosis.  I discussed the various treatment options with her.  She has decided proceed with surgery.  Preoperative diagnosis: Cervical pseudoarthrosis, cervicalgia  Postop diagnosis: Same  Procedure: Posterior C5-6 and C6-7 arthrodesis with Zimmer bone graft extender; posterior cervical instrumentation C5-C7 with Zimmer titanium lateral mass screws and rods  Surgeon: Dr. Earle Gell  Assistant: Arnetha Massy, NP  Anesthesia: General endotracheal  Estimated blood loss: 75 cc  Specimens: None  Drains: None  Complications: None  Description of procedure: The patient was brought to the op room by the anesthesia team.  General endotracheal anesthesia was induced.  I applied the [place] 3 point headrest to the patient calvarium.  The patient was turned to the prone position on the chest rolls.  The patient's suboccipital region was then shaved with clippers and under this region the posterior cervical and upper thoracic region was prepared with Betadine scrub and Betadine solution.  Sterile drapes were applied.  I injected the area to be incised with lidocaine with epinephrine solution.  I used a scalpel to make a linear midline incision from approximately C5-C7.  We used a cerebellar retractor for exposure.  We used electrocautery to perform a bilateral subperiosteal dissection exposing the spinous process lamina and lateral masses/facets at C5-6 and C6-7.  We confirmed our location with intraoperative fluoroscopy.  We inserted the [facility] retractor for exposure.  I then identified the center of the lateral masses bilaterally at C5, C6 and C7.  We drilled a 14 mm hole in an cephalad and lateral  direction each lateral mass.  We could use intraoperative fluoroscopy at the C5 lateral masses but not at the level or lateral masses because of the patient's shoulders.  We probe inside the anus to rule out cortical breaches.  We then inserted a 3.5 x 40 mm lateral mass screw into the lateral masses bilaterally at C5, C6 and C7.  We got good bony purchase.  We then connected the unilateral screws with the rod.  We secured the rod in place with caps which were tightened appropriately.  This completed the instrumentation.  We now turned our attention to the posterior arthrodesis.  I used a high-speed drill to decorticate the lateral lamina, facet, lateral masses of C5-6 and C6-7 bilaterally.  We then laid Zimmer bone graft extender over decorticated posterolateral structures completing the posterior arthrodesis bilaterally at C5-6 and C6-7.  We obtained hemostasis with bipolar cautery.  We then removed the retractor.  We injected Exparel.  We reapproximated the patient's fascia with 0 Vicryl suture.  We reapproximated the subcutaneous tissue with interrupted 2-0 Vicryl suture.  We reapproximated the skin with Steri-Strips and benzoin.  The wound was then coated with bacitracin ointment.  A sterile dressing was applied.  The drapes were removed.  The patient was then returned to the supine position.  I then removed the [place] 3 point headrest from the patient's calvarium.  By report all sponge, instrument, and needle counts were correct at the end this case.

## 2023-02-03 NOTE — Anesthesia Procedure Notes (Signed)
Procedure Name: Intubation Date/Time: 02/03/2023 7:38 AM  Performed by: Maude Leriche, CRNAPre-anesthesia Checklist: Patient identified, Emergency Drugs available, Suction available and Patient being monitored Patient Re-evaluated:Patient Re-evaluated prior to induction Oxygen Delivery Method: Circle system utilized Preoxygenation: Pre-oxygenation with 100% oxygen Induction Type: IV induction Ventilation: Mask ventilation without difficulty Laryngoscope Size: Glidescope and 3 Grade View: Grade I Tube type: Oral Tube size: 7.0 mm Number of attempts: 1 Airway Equipment and Method: Stylet, Video-laryngoscopy and Bite block Placement Confirmation: ETT inserted through vocal cords under direct vision, positive ETCO2 and breath sounds checked- equal and bilateral Secured at: 21 cm Tube secured with: Tape Dental Injury: Teeth and Oropharynx as per pre-operative assessment  Comments: Elective glidescope for c-spine mobilization

## 2023-02-03 NOTE — Transfer of Care (Signed)
Immediate Anesthesia Transfer of Care Note  Patient: [name]  Procedure(s) Performed: POSTERIOR CERVICAL FUSION WITH LATERAL MASS FIXATION CERVICAL SIX-CERVICAL SEVEN; EXPLORE CERVICAL FIVE-CERVICAL SIX WITH POSSIBLE  FUSION  Patient Location: PACU  Anesthesia Type:General  Level of Consciousness: awake, alert , and oriented  Airway & Oxygen Therapy: Patient Spontanous Breathing and Patient connected to nasal cannula oxygen  Post-op Assessment: Report given to RN, Post -op Vital signs reviewed and stable, Patient moving all extremities X 4, and Patient able to stick tongue midline  Post vital signs: Reviewed  Last Vitals:  Vitals Value Taken Time  BP 172/93 02/03/23 0945  Temp 97.6   Pulse 105 02/03/23 0947  Resp 23 02/03/23 0947  SpO2 97 % 02/03/23 0947  Vitals shown include unvalidated device data.  Last Pain:  Vitals:   02/03/23 0643  TempSrc:   PainSc: 2       Patients Stated Pain Goal: 0 (56/25/63 8937)  Complications: No notable events documented.

## 2023-02-03 NOTE — Progress Notes (Signed)
Orthopedic Tech Progress Note Patient Details:  [name] 11-08-1951 [id number] [employer] [place] Patient ID: [name], female   DOB: 1951-07-11, 72 y.o.   MRN: [id number]  Chip Boer 02/03/2023, 10:02 AM

## 2023-02-03 NOTE — Anesthesia Postprocedure Evaluation (Signed)
Anesthesia Post Note  Patient: [name]  Procedure(s) Performed: POSTERIOR CERVICAL FUSION WITH LATERAL MASS FIXATION CERVICAL SIX-CERVICAL SEVEN; EXPLORE CERVICAL FIVE-CERVICAL SIX WITH POSSIBLE  FUSION     Patient location during evaluation: PACU Anesthesia Type: General Level of consciousness: sedated and patient cooperative Pain management: pain level controlled Vital Signs Assessment: post-procedure vital signs reviewed and stable Respiratory status: spontaneous breathing Cardiovascular status: stable Anesthetic complications: no   No notable events documented.  Last Vitals:  Vitals:   02/03/23 1604 02/03/23 1947  BP: (!) 119/58 (!) 114/54  Pulse: 86 89  Resp: 16 18  Temp: 36.6 C 36.9 C  SpO2: 95% 96%    Last Pain:  Vitals:   02/03/23 1947  TempSrc: Oral  PainSc:                  [name]

## 2023-02-03 NOTE — OR Nursing (Signed)
Patient arrived to OR with bowel/bladder stimulator still powered on. Nurse attempted to turn generator off using included instructions and controller but was not successful. Short stay nurse spoke to patient husband who stated he did not know how to turn it off either and advised that she has had surgery before with it on. Dr Arnoldo Morale was consulted and decided it was ok to go ahead with surgery and to consult with stimulator rep afterwards.

## 2023-02-03 NOTE — H&P (Signed)
Subjective: The patient is a 72 year old white female who previous anterior cervicectomy fusion and plating.  She has developed chronic and worsening neck pain.  She was worked up with x-rays which demonstrated a pseudoarthrosis.  I discussed the various treatment options with her.  She has decided to proceed with surgery  Past Medical History:  Diagnosis Date   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   Arthritis    back   Asthma    20 yrs., ago one episode   Cancer [facility])    Appendicial   Chronic migraine headaches 03/13/2008   Chronic rhinitis 03/13/2008   Deviated septum 01/23/2020   Dysesthesia    bilateral feet   Dysphagia, oropharyngeal phase 10/21/2009   Dyspnea    Gastroesophageal reflux disease 09/10/2008   Heart murmur    first noticed 41 yrs. 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 , low grade appendiceal mucinous neoplasm    HNP (herniated nucleus pulposus), cervical 05/17/2019   Hypokalemia 12/04/2019   Mitral valve prolapse    states no problem   Multiple sclerosis 1989   Myocarditis 04/25/2019   Neurogenic bladder    Obstructive chronic bronchitis with exacerbation 09/10/2008   Qualifier: Diagnosis of  By: Joya Gaskins MD, Burnett Harry    Occipital neuralgia 05/06/2015   Osteoporosis    Pneumonia    PONV (postoperative nausea and vomiting)    Pulmonary nodule, right upper lobe 01/22/2009   Short of breath on exertion    Sjogren's disease    dryness of eyes, mouth   Spastic gait    Transverse myelitis    T4   Urinary frequency 02/05/2015   Vitamin D deficiency 12/09/2016   Vocal cord disorder 10/21/2009   Wears bilateral ankle braces    AFO braces for stability    Past Surgical History:  Procedure Laterality Date   ANAL RECTAL MANOMETRY N/A 08/18/2016   Procedure: ANO RECTAL MANOMETRY;  Surgeon: Leighton Ruff, MD;  Location: [facility]  ENDOSCOPY;  Service: Endoscopy;  Laterality: N/A;   ANTERIOR CERVICAL DECOMP/DISCECTOMY FUSION  2004;   2003   C4 -- C5 (2004)/   C3 -- C4 (2003)   ANTERIOR CERVICAL DECOMP/DISCECTOMY FUSION N/A 05/17/2019   Procedure: EVACUATION HEMATOMA OF POST OP ANTERIOR CERVICAL DECOMPRESSION FUSION;  Surgeon: Jovita Gamma, MD;  Location: [facility];  Service: Neurosurgery;  Laterality: N/A;   ANTERIOR CERVICAL DECOMP/DISCECTOMY FUSION N/A 05/17/2019   Procedure: REMOVAL OF TETHER CERVICAL PLATE, ANTERIOR CERVICAL DECOMPRESSION/DISCECTOMY FUSION CERVICAL FIVE- CERVICAL SIX, CERVICAL SIX- CERVICAL SEVEN;  Surgeon: Jovita Gamma, MD;  Location: [facility];  Service: Neurosurgery;  Laterality: N/A;   APPENDECTOMY  06/22/2011   laparoscopic   BILATERAL SALPINGOOPHORECTOMY  1982   BLADDER SUSPENSION  1994   sling   BREAST ENHANCEMENT SURGERY Bilateral    BREAST IMPLANT REMOVAL Bilateral 12/03/2008   BUNIONECTOMY     CATARACT EXTRACTION W/ INTRAOCULAR LENS  IMPLANT, BILATERAL  2015   ELBOW SURGERY Right    HEMORROIDECTOMY  08/05/2010;  2013   INSERTION SACRAL NERVE STIMULATOR TEST WIRE  09-28-2016   dr Marcello Moores   INTERCOSTAL NERVE BLOCK  2015   occipital   MENISCUS REPAIR Left 09/26/2018   RECTAL ULTRASOUND N/A 08/18/2016   Procedure: RECTAL ULTRASOUND;  Surgeon: Leighton Ruff, MD;  Location: [facility] ENDOSCOPY;  Service: Endoscopy;  Laterality: N/A;   SHOULDER ARTHROSCOPY Left    SHOULDER ARTHROSCOPY WITH DISTAL CLAVICLE RESECTION Right 09/21/2007   w/  Acrominoplasty,  Debridement Rotator Cuff,  CA ligament release   TONSILLECTOMY  age 64   TRIGGER FINGER RELEASE Left 01/16/2015   Procedure: LEFT THUMB TRIGGER RELEASE ;  Surgeon: Leanora Cover, MD;  Location: [place];  Service: Orthopedics;  Laterality: Left;   VAGINAL HYSTERECTOMY  1981    Allergies  Allergen Reactions   Other Shortness Of Breath    PURPLE LETTUCE   Betaseron [Interferon Beta-1b] Other (See Comments)    HARD NODULAR AREAS AT  INJECTION SITE   Codeine Nausea And Vomiting   Gilenya [Fingolimod Hydrochloride] Other (See Comments)    MACULAR EDEMA   Morphine And Related Other (See Comments)    IV ROUTE - RED STREAKS OF VEIN   Sumatriptan Nausea Only    INJECTABLE ONLY - RAPID HEART RATE, FLUSHING   Tysabri [Natalizumab] Other (See Comments)    DEVELOPED JCV ANTIBODY   Zinc Nicotinate [Zinc]    Latex Rash    Social History   Tobacco Use   Smoking status: Never    Passive exposure: Yes   Smokeless tobacco: Never   Tobacco comments:    parents, husband, & multiple family members  Substance Use Topics   Alcohol use: No    Alcohol/week: 0.0 standard drinks of alcohol    Family History  Problem Relation Age of Onset   Liver disease Father    Cirrhosis Father    Stroke Mother    Cancer Mother        oropharyngeal   Heart attack Mother    Anesthesia problems Mother        post-op nausea   Cancer Brother        twin; tonsillar?   Cancer Sister 9       breast ca, lung ca   Cancer Other        Nephew; appendiceal carcnoid, melanoma   Cancer Paternal Grandmother        cervical   Prior to Admission medications   Medication Sig Start Date End Date Taking? Authorizing Provider  acetaminophen (TYLENOL) 500 MG tablet Take 500-1,000 mg by mouth every 6 (six) hours as needed for moderate pain.   Yes [provider]  baclofen (LIORESAL) 10 MG tablet Take 0.5-1 tablets (5-10 mg total) by mouth 3 (three) times daily as needed. Patient taking differently: Take 10 mg by mouth at bedtime. 12/14/22  Yes Lomax, Amy, NP  Biotin 5000 MCG CAPS Take 5,000 mcg by mouth daily.   Yes [provider]  caffeine 200 MG TABS tablet Take 200 mg by mouth daily.   Yes [provider]  calcium carbonate (OS-CAL) 600 MG TABS tablet Take 1,200 mg by mouth.    Yes [provider]  Dexlansoprazole 30 MG capsule DR Take 30 mg by mouth daily.   Yes [provider]  donepezil (ARICEPT) 10 MG  tablet TAKE 1 TABLET BY MOUTH EVERY NIGHT AT BEDTIME 12/14/22  Yes Lomax, Amy, NP  lamoTRIgine (LAMICTAL) 200 MG tablet Take 1 tablet (200 mg total) by mouth 2 (two) times daily. 12/14/22  Yes Lomax, Amy, NP  loratadine (CLARITIN) 10 MG tablet Take 10 mg by mouth daily.   Yes [provider]  methylphenidate (RITALIN) 10 MG tablet Take 1 tablet (10 mg total) by mouth 2 (two) times daily. 01/10/23  Yes Sater, Nanine Means, MD  Multiple  Vitamins-Minerals (MULTIVITAMIN PO) Take 1 tablet by mouth daily. Brand name - Mature Multivitamin from [employer]   Yes [provider]  Probiotic Product ([place] PO) Take 1 tablet by mouth every morning.   Yes [provider]  rOPINIRole (REQUIP) 0.25 MG tablet TAKE 1 TO 2 TABLETS BY MOUTH AT NIGHT Patient taking differently: Take 0.5 mg by mouth at bedtime. TAKE 1 TO 2 TABLETS BY MOUTH AT NIGHT 08/17/22  Yes Sater, Nanine Means, MD  rosuvastatin (CRESTOR) 20 MG tablet Take 20 mg by mouth at bedtime.   Yes [provider]  vitamin B-12 (CYANOCOBALAMIN) 1000 MCG tablet Take 1,000 mcg by mouth daily.   Yes [provider]  VITAMIN D PO Take 2,000 Units by mouth daily.   Yes [provider]  ZETIA 10 MG tablet Take 10 mg by mouth daily. 01/20/23  Yes [provider]  denosumab (PROLIA) 60 MG/ML SOLN injection Inject 60 mg into the skin every 6 (six) months. Administer in upper arm, thigh, or abdomen    [provider]  estradiol (ESTRACE) 0.1 MG/GM vaginal cream Place 1 Applicatorful vaginally 3 (three) times a week. 09/28/21   [provider]  tamsulosin (FLOMAX) 0.4 MG CAPS capsule Take 1 capsule (0.4 mg total) by mouth daily. Patient not taking: Reported on 01/25/2023 07/20/22   Britt Bottom, MD     Review of Systems  Positive ROS: As above    All other systems have been reviewed and were otherwise negative with the exception of those mentioned in the HPI and as  above.  Objective: Vital signs in last 24 hours: Temp:  [98.4 F (36.9 C)] 98.4 F (36.9 C) (02/08 0557) Pulse Rate:  [74] 74 (02/08 0557) Resp:  [17] 17 (02/08 0557) BP: (112)/(63) 112/63 (02/08 0557) SpO2:  [98 %] 98 % (02/08 0557) Weight:  [62.3 kg] 62.3 kg (02/08 0557) Estimated body mass index is 19.71 kg/m as calculated from the following:   Height as of this encounter: '5\' 10"'$  (1.778 m).   Weight as of this encounter: 62.3 kg.   General Appearance: Alert Head: Normocephalic, without obvious abnormality, atraumatic Eyes: PERRL, conjunctiva/corneas clear, EOM's intact,    Ears: Normal  Throat: Normal  Neck: Her anterior cervical incision is well-healed.  Back: unremarkable Lungs: Clear to auscultation bilaterally, respirations unlabored Heart: Regular rate and rhythm, no murmur, rub or gallop Abdomen: Soft, non-tender Extremities: Extremities normal, atraumatic, no cyanosis or edema Skin: unremarkable  NEUROLOGIC:   Mental status: alert and oriented,Motor Exam - grossly normal Sensory Exam - grossly normal Reflexes:  Coordination - grossly normal Gait - grossly normal Balance - grossly normal Cranial Nerves: I: smell Not tested  II: visual acuity  OS: Normal  OD: Normal   II: visual fields Full to confrontation  II: pupils Equal, round, reactive to light  III,VII: ptosis None  III,IV,VI: extraocular muscles  Full ROM  V: mastication Normal  V: facial light touch sensation  Normal  V,VII: corneal reflex  Present  VII: facial muscle function - upper  Normal  VII: facial muscle function - lower Normal  VIII: hearing Not tested  IX: soft palate elevation  Normal  IX,X: gag reflex Present  XI: trapezius strength  5/5  XI: sternocleidomastoid strength 5/5  XI: neck flexion strength  5/5  XII: tongue strength  Normal    Data Review Lab Results  Component Value Date   WBC 5.6 01/28/2023   HGB 14.5 01/28/2023  HCT 44.3 01/28/2023   MCV 93.7 01/28/2023    PLT 214 01/28/2023   Lab Results  Component Value Date   NA 140 01/28/2023   K 4.1 01/28/2023   CL 103 01/28/2023   CO2 30 01/28/2023   BUN 11 01/28/2023   CREATININE 0.93 01/28/2023   GLUCOSE 89 01/28/2023   Lab Results  Component Value Date   INR 1.0 10/31/2019    Assessment/Plan: Cervical pseudoarthrosis, cervicalgia: I have discussed this with patient.  We discussed the various treatment options for surgery.  I described the surgical treatment option of a posterior cervical instrumentation and fusion I have shown her surgical models.  We have discussed the risk, benefits, alternatives, expected postoperative course, and likelihood of achieving our goals with surgery.  I have answered all her questions.  She has decided proceed with surgery.   Ophelia Charter 02/03/2023 7:20 AM

## 2023-02-04 MED ORDER — OXYCODONE-ACETAMINOPHEN 5-325 MG PO TABS: 1.0000 | ORAL_TABLET | ORAL | 0 refills | Status: AC | PRN

## 2023-02-04 MED ORDER — DOCUSATE SODIUM 100 MG PO CAPS: 100.0000 mg | ORAL_CAPSULE | Freq: Two times a day (BID) | ORAL | 0 refills | Status: DC

## 2023-02-04 MED ORDER — OXYCODONE-ACETAMINOPHEN 5-325 MG PO TABS: 1.0000 | ORAL_TABLET | ORAL | Status: DC | PRN

## 2023-02-04 MED ORDER — CYCLOBENZAPRINE HCL 5 MG PO TABS: 5.0000 mg | ORAL_TABLET | Freq: Three times a day (TID) | ORAL | 0 refills | Status: AC | PRN

## 2023-02-04 MED ORDER — CYCLOBENZAPRINE HCL 5 MG PO TABS: 5.0000 mg | ORAL_TABLET | Freq: Three times a day (TID) | ORAL | Status: DC | PRN

## 2023-02-04 NOTE — Evaluation (Signed)
Occupational Therapy Evaluation/Discharge Patient Details Name: [name] MRN: [id number] DOB: 09-09-1951 Today's Date: 02/04/2023   History of Present Illness Pt is a 72 y/o female admitted for posterior ACDF of C5-6 and C6-7 on 2/8. PMH: asthma, migraines, GERD, heart murmur, hiatal hernia, MS, myocarditis, occipital neuralgia, transverse myelitis   Clinical Impression   PTA, pt lives with spouse and typically completely independent in all daily tasks. Pt presents now s/p sx above and eager to return home. Educated re: cervical precautions for ADLs and general body mechanics during recovery. Pt able to ambulate in room Independently and denies issues with hallway mobility earlier this AM. Pt's spouse present, had assisted pt with getting dressed in prep for DC and plans to assist with ADLs/IADLs as needed at home. Pt/spouse verbalized understanding of all education without need for continued OT services.       Recommendations for follow up therapy are one component of a multi-disciplinary discharge planning process, led by the attending physician.  Recommendations may be updated based on patient status, additional functional criteria and insurance authorization.   Follow Up Recommendations  No OT follow up     Assistance Recommended at Discharge PRN  Patient can return home with the following Assist for transportation;Assistance with cooking/housework    Functional Status Assessment  Patient has not had a recent decline in their functional status  Equipment Recommendations  None recommended by OT    Recommendations for Other Services       Precautions / Restrictions Precautions Precautions: Cervical Precaution Booklet Issued: Yes (comment) Required Braces or Orthoses: Cervical Brace Cervical Brace: Hard collar Restrictions Weight Bearing Restrictions: No      Mobility Bed Mobility               General bed mobility comments: received standing in room     Transfers Overall transfer level: Independent Equipment used: None                      Balance Overall balance assessment: Independent                                         ADL either performed or assessed with clinical judgement   ADL Overall ADL's : Modified independent                                       General ADL Comments: already dressed on entry, reports spouse assisted with LB ADLs though pt able to demo ability to use figure four position to reach easily. Educated on cervical precautions for ADLs, assist with showering as needed as well as compensatory strategies education and use of long handled brush (already has), discussed fall prevention with cats in the home, no driving until follow up appt and general body mechanics for mobility, as well as sleeping/positioning in chair. encouraged frequent mobilization at home, wearing collar at church, when out of house and when walking     Vision Baseline Vision/History: 0 No visual deficits Ability to See in Adequate Light: 0 Adequate Patient Visual Report: No change from baseline Vision Assessment?: No apparent visual deficits     Perception     Praxis      Pertinent Vitals/Pain Pain Assessment Pain Assessment: No/denies pain     Hand Dominance Right  Extremity/Trunk Assessment Upper Extremity Assessment Upper Extremity Assessment: Overall WFL for tasks assessed   Lower Extremity Assessment Lower Extremity Assessment: Defer to PT evaluation   Cervical / Trunk Assessment Cervical / Trunk Assessment: Neck Surgery   Communication Communication Communication: No difficulties   Cognition Arousal/Alertness: Awake/alert Behavior During Therapy: WFL for tasks assessed/performed Overall Cognitive Status: Within Functional Limits for tasks assessed                                       General Comments  Husband at bedside    Exercises      Shoulder Instructions      Home Living Family/patient expects to be discharged to:: Private residence Living Arrangements: Spouse/significant other Available Help at Discharge: Family;Available 24 hours/day Type of Home: Other(Comment) (townhouse) Home Access: Level entry     Home Layout: One level     Bathroom Shower/Tub: Tub/shower unit;Walk-in shower   Bathroom Toilet: Handicapped height     Home Equipment: Hand held shower head          Prior Functioning/Environment Prior Level of Function : Independent/Modified Independent;Driving                        OT Problem List:        OT Treatment/Interventions:      OT Goals(Current goals can be found in the care plan section) Acute Rehab OT Goals Patient Stated Goal: home soon OT Goal Formulation: All assessment and education complete, DC therapy  OT Frequency:      Co-evaluation              AM-PAC OT "6 Clicks" Daily Activity     Outcome Measure Help from another person eating meals?: None Help from another person taking care of personal grooming?: None Help from another person toileting, which includes using toliet, bedpan, or urinal?: None Help from another person bathing (including washing, rinsing, drying)?: None Help from another person to put on and taking off regular upper body clothing?: None Help from another person to put on and taking off regular lower body clothing?: None 6 Click Score: 24   End of Session Equipment Utilized During Treatment: Cervical collar Nurse Communication: Mobility status  Activity Tolerance: Patient tolerated treatment well Patient left: in bed;with call bell/phone within reach;with family/visitor present  OT Visit Diagnosis: Other abnormalities of gait and mobility (R26.89)                [id number] OT Time Calculation (min): 15 min Charges:  OT General Charges $OT Visit: 1 Visit OT Evaluation $OT Eval Low Complexity: 1 Low  Malachy Chamber,  OTR/L Acute Rehab Services Office: [phone]   [name] 02/04/2023, 8:44 AM

## 2023-02-04 NOTE — Plan of Care (Signed)
  Problem: Education: Goal: Ability to verbalize activity precautions or restrictions will improve Outcome: Completed/Met Goal: Knowledge of the prescribed therapeutic regimen will improve Outcome: Completed/Met Goal: Understanding of discharge needs will improve Outcome: Completed/Met   Problem: Activity: Goal: Ability to avoid complications of mobility impairment will improve Outcome: Completed/Met Goal: Ability to tolerate increased activity will improve Outcome: Completed/Met Goal: Will remain free from falls Outcome: Completed/Met   Problem: Bowel/Gastric: Goal: Gastrointestinal status for postoperative course will improve Outcome: Completed/Met   Problem: Clinical Measurements: Goal: Ability to maintain clinical measurements within normal limits will improve Outcome: Completed/Met Goal: Postoperative complications will be avoided or minimized Outcome: Completed/Met Goal: Diagnostic test results will improve Outcome: Completed/Met   Problem: Pain Management: Goal: Pain level will decrease Outcome: Completed/Met   Problem: Skin Integrity: Goal: Will show signs of wound healing Outcome: Completed/Met   Problem: Health Behavior/Discharge Planning: Goal: Identification of resources available to assist in meeting health care needs will improve Outcome: Completed/Met   Problem: Bladder/Genitourinary: Goal: Urinary functional status for postoperative course will improve Outcome: Completed/Met   Problem: Bladder/Genitourinary: Goal: Urinary functional status for postoperative course will improve Outcome: Completed/Met  Patient alert and oriented, ambulate, VSS, void. Surgical site clean and dry. D/c instructions explain and given. Pt. D/c home per order.

## 2023-02-04 NOTE — Discharge Summary (Signed)
Physician Discharge Summary  Patient ID: [name] MRN: [id number] DOB/AGE: [birth year] 72 y.o.  Admit date: 02/03/2023 Discharge date: 02/04/2023  Admission Diagnoses: Cervical pseudoarthrosis, cervicalgia  Discharge Diagnoses: Same Principal Problem:   Cervical pseudoarthrosis [facility])   Discharged Condition: good  Hospital Course: I performed a posterior C5-6 and C6-7 instrumentation and fusion on the patient on 02/03/2023.  The surgery went well.  The patient's postoperative course was unremarkable.  On postoperative day #1 the patient felt well and requested discharge to home.  The patient, and her husband, were given verbal and written discharge instructions.  All her questions were answered.  Consults: OT Significant Diagnostic Studies: None Treatments: C5-6 and C6-7 posterior cervical instrumentation and fusion. Discharge Exam: Blood pressure (!) 98/59, pulse 77, temperature 98.6 F (37 C), temperature source Oral, resp. rate 20, height 5' 10"$  (1.778 m), weight 62.3 kg, SpO2 97 %. The patient is alert and pleasant.  She looks well.  Her strength is normal.  Disposition: Home  Discharge Instructions     Call MD for:  difficulty breathing, headache or visual disturbances   Complete by: As directed    Call MD for:  extreme fatigue   Complete by: As directed    Call MD for:  hives   Complete by: As directed    Call MD for:  persistant dizziness or light-headedness   Complete by: As directed    Call MD for:  persistant nausea and vomiting   Complete by: As directed    Call MD for:  redness, tenderness, or signs of infection (pain, swelling, redness, odor or green/yellow discharge around incision site)   Complete by: As directed    Call MD for:  severe uncontrolled pain   Complete by: As directed    Call MD for:  temperature >100.4   Complete by: As directed    Diet - low sodium heart healthy   Complete by: As directed    Discharge instructions   Complete by: As  directed    Call [phone] for a followup appointment. Take a stool softener while you are using pain medications.   Driving Restrictions   Complete by: As directed    Do not drive for 2 weeks.   Increase activity slowly   Complete by: As directed    Lifting restrictions   Complete by: As directed    Do not lift more than 5 pounds. No excessive bending or twisting.   May shower / Bathe   Complete by: As directed    Remove the dressing for 3 days after surgery.  You may shower, but leave the incision alone.   Remove dressing in 24 hours   Complete by: As directed       Allergies as of 02/04/2023       Reactions   Other Shortness Of Breath   PURPLE LETTUCE   Betaseron [interferon Beta-1b] Other (See Comments)   HARD NODULAR AREAS AT INJECTION SITE   Codeine Nausea And Vomiting   Gilenya [fingolimod Hydrochloride] Other (See Comments)   MACULAR EDEMA   Morphine And Related Other (See Comments)   IV ROUTE - RED STREAKS OF VEIN   Sumatriptan Nausea Only   INJECTABLE ONLY - RAPID HEART RATE, FLUSHING   Tysabri [natalizumab] Other (See Comments)   DEVELOPED JCV ANTIBODY   Zinc Nicotinate [zinc]    Latex Rash        Medication List     STOP taking these medications    acetaminophen 500  MG tablet Commonly known as: TYLENOL       TAKE these medications    baclofen 10 MG tablet Commonly known as: LIORESAL Take 0.5-1 tablets (5-10 mg total) by mouth 3 (three) times daily as needed. What changed:  how much to take when to take this   Biotin 5000 MCG Caps Take 5,000 mcg by mouth daily.   caffeine 200 MG Tabs tablet Take 200 mg by mouth daily.   calcium carbonate 600 MG Tabs tablet Commonly known as: OS-CAL Take 1,200 mg by mouth.   cyanocobalamin 1000 MCG tablet Commonly known as: VITAMIN B12 Take 1,000 mcg by mouth daily.   cyclobenzaprine 5 MG tablet Commonly known as: FLEXERIL Take 1 tablet (5 mg total) by mouth 3 (three) times daily as needed for  muscle spasms.   denosumab 60 MG/ML Soln injection Commonly known as: PROLIA Inject 60 mg into the skin every 6 (six) months. Administer in upper arm, thigh, or abdomen   Dexlansoprazole 30 MG capsule DR Take 30 mg by mouth daily.   docusate sodium 100 MG capsule Commonly known as: COLACE Take 1 capsule (100 mg total) by mouth 2 (two) times daily.   donepezil 10 MG tablet Commonly known as: ARICEPT TAKE 1 TABLET BY MOUTH EVERY NIGHT AT BEDTIME   estradiol 0.1 MG/GM vaginal cream Commonly known as: ESTRACE Place 1 Applicatorful vaginally 3 (three) times a week.   lamoTRIgine 200 MG tablet Commonly known as: LAMICTAL Take 1 tablet (200 mg total) by mouth 2 (two) times daily.   loratadine 10 MG tablet Commonly known as: CLARITIN Take 10 mg by mouth daily.   methylphenidate 10 MG tablet Commonly known as: Ritalin Take 1 tablet (10 mg total) by mouth 2 (two) times daily.   MULTIVITAMIN PO Take 1 tablet by mouth daily. Brand name - Mature Multivitamin from [employer] MG tablet Commonly known as: PERCOCET/ROXICET Take 1-2 tablets by mouth every 4 (four) hours as needed for moderate pain.   [facility] COLON HEALTH PO Take 1 tablet by mouth every morning.   rOPINIRole 0.25 MG tablet Commonly known as: REQUIP TAKE 1 TO 2 TABLETS BY MOUTH AT NIGHT What changed: See the new instructions.   rosuvastatin 20 MG tablet Commonly known as: CRESTOR Take 20 mg by mouth at bedtime.   tamsulosin 0.4 MG Caps capsule Commonly known as: FLOMAX Take 1 capsule (0.4 mg total) by mouth daily.   VITAMIN D PO Take 2,000 Units by mouth daily.   Zetia 10 MG tablet Generic drug: ezetimibe Take 10 mg by mouth daily.         Signed: Ophelia Charter 02/04/2023, 7:41 AM

## 2023-02-04 NOTE — Progress Notes (Signed)
Patient alert and oriented, mae's well, voiding adequate amount of urine, swallowing without difficulty, no c/o pain at time of discharge. Patient discharged home with family. Script and discharged instructions given to patient. Patient and family stated understanding of instructions given. Patient has an appointment with Dr. Arnoldo Morale in 3 weeks

## 2023-02-11 ENCOUNTER — Encounter (HOSPITAL_COMMUNITY): Payer: Self-pay | Admitting: Neurosurgery

## 2023-02-16 ENCOUNTER — Ambulatory Visit: Payer: PPO | Admitting: Cardiology

## 2023-02-16 ENCOUNTER — Encounter: Payer: Self-pay | Admitting: Cardiology

## 2023-02-16 VITALS — BP 118/77 | Resp 16 | Ht 70.0 in | Wt 142.0 lb

## 2023-02-16 DIAGNOSIS — R5383 Other fatigue: Secondary | ICD-10-CM | POA: Diagnosis not present

## 2023-02-16 DIAGNOSIS — R55 Syncope and collapse: Secondary | ICD-10-CM

## 2023-02-16 DIAGNOSIS — G35 Multiple sclerosis: Secondary | ICD-10-CM

## 2023-02-16 DIAGNOSIS — R5381 Other malaise: Secondary | ICD-10-CM | POA: Diagnosis not present

## 2023-02-16 NOTE — Progress Notes (Signed)
Primary Physician/Referring:  Crist Infante, MD  Patient ID: [name], female    DOB: 1951/06/04, 72 y.o.   MRN: [id number]  Chief Complaint  Patient presents with   Loss of Consciousness   Shortness of Breath   Follow-up   HPI:    [name]  is a 72 y.o. Caucasian female patient with multiple sclerosis, has had history of transverse myelitis, residual bilateral leg weakness left worse than the right, last cardiac admission with "STEMI" in April 2020 revealing normal coronary arteries and mid anterior hypokinesis of the left ventricle, suggestive of either Takotsubo or myopericarditis.  Repeat echocardiogram had complete normalization of LVEF in 2020.  She is referred back to me for evaluation of recurrent episodes of syncope.  She has had 3 episodes over the past 3 to 4 months.  Last episode was a month ago when she got up from her bedroom and went to the kitchen and was trying to take a Tums as she was also having some abdominal discomfort, then she fell to the ground and had mild abrasion on her scalp.  Although 3 episodes were preceded by feeling dizzy, mild chest discomfort, episodes of abdominal discomfort and also episode of diarrhea prior to proceeding with syncope.  All episodes occurring while she is standing up on her feet after having been either sitting or laying down.  No other associated symptoms of neurologic deficits, nausea, vomiting.  No chest pain.  Past Medical History:  Diagnosis Date   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   Arthritis    back   Asthma    20 yrs., ago one episode   Cancer [facility])    Appendicial   Chronic migraine headaches 03/13/2008   Chronic rhinitis 03/13/2008   Deviated septum 01/23/2020   Dysesthesia    bilateral feet   Dysphagia, oropharyngeal phase 10/21/2009   Dyspnea    Gastroesophageal reflux disease 09/10/2008   Heart murmur    first noticed 41 yrs. when pregnant, can't hear it now   History of colon polyps     2013;  2015   History of hiatal hernia    History of neoplasm    06-22-2011  s/p  appendectomy --  per path , low grade appendiceal mucinous neoplasm    HNP (herniated nucleus pulposus), cervical 05/17/2019   Hypokalemia 12/04/2019   Mitral valve prolapse    states no problem   Multiple sclerosis 1989   Myocarditis 04/25/2019   Neurogenic bladder    Obstructive chronic bronchitis with exacerbation 09/10/2008   Qualifier: Diagnosis of  By: Joya Gaskins MD, Burnett Harry    Occipital neuralgia 05/06/2015   Osteoporosis    Pneumonia    PONV (postoperative nausea and vomiting)    Pulmonary nodule, right upper lobe 01/22/2009   Short of breath on exertion    Sjogren's disease    dryness of eyes, mouth   Spastic gait    Transverse myelitis    T4   Urinary frequency 02/05/2015   Vitamin D deficiency 12/09/2016   Vocal cord disorder 10/21/2009   Wears bilateral ankle braces    AFO braces for stability   Past Surgical History:  Procedure Laterality Date   ANAL RECTAL MANOMETRY N/A 08/18/2016   Procedure: ANO RECTAL MANOMETRY;  Surgeon: Leighton Ruff, MD;  Location: [facility];  Service: Endoscopy;  Laterality: N/A;   ANTERIOR CERVICAL DECOMP/DISCECTOMY FUSION  2004;   2003   C4 -- C5 (2004)/   C3 -- C4 (2003)  ANTERIOR CERVICAL DECOMP/DISCECTOMY FUSION N/A 05/17/2019   Procedure: EVACUATION HEMATOMA OF POST OP ANTERIOR CERVICAL DECOMPRESSION FUSION;  Surgeon: Jovita Gamma, MD;  Location: [place];  Service: Neurosurgery;  Laterality: N/A;   ANTERIOR CERVICAL DECOMP/DISCECTOMY FUSION N/A 05/17/2019   Procedure: REMOVAL OF TETHER CERVICAL PLATE, ANTERIOR CERVICAL DECOMPRESSION/DISCECTOMY FUSION CERVICAL FIVE- CERVICAL SIX, CERVICAL SIX- CERVICAL SEVEN;  Surgeon: Jovita Gamma, MD;  Location: [place];  Service: Neurosurgery;  Laterality: N/A;   APPENDECTOMY  06/22/2011   laparoscopic   BILATERAL SALPINGOOPHORECTOMY  1982   BLADDER SUSPENSION  1994   sling   BREAST ENHANCEMENT SURGERY  Bilateral    BREAST IMPLANT REMOVAL Bilateral 12/03/2008   BUNIONECTOMY     CATARACT EXTRACTION W/ INTRAOCULAR LENS  IMPLANT, BILATERAL  2015   ELBOW SURGERY Right    HEMORROIDECTOMY  08/05/2010;  2013   INSERTION SACRAL NERVE STIMULATOR TEST WIRE  09-28-2016   dr Marcello Moores   INTERCOSTAL NERVE BLOCK  2015   occipital   MENISCUS REPAIR Left 09/26/2018   POSTERIOR CERVICAL FUSION/FORAMINOTOMY N/A 02/03/2023   Procedure: POSTERIOR CERVICAL FUSION WITH LATERAL MASS FIXATION CERVICAL SIX-CERVICAL SEVEN; EXPLORE CERVICAL FIVE-CERVICAL SIX WITH POSSIBLE  FUSION;  Surgeon: Newman Pies, MD;  Location: [place];  Service: Neurosurgery;  Laterality: N/A;   RECTAL ULTRASOUND N/A 08/18/2016   Procedure: RECTAL ULTRASOUND;  Surgeon: Leighton Ruff, MD;  Location[facility];  Service: Endoscopy;  Laterality: N/A;   SHOULDER ARTHROSCOPY Left    SHOULDER ARTHROSCOPY WITH DISTAL CLAVICLE RESECTION Right 09/21/2007   w/  Acrominoplasty,  Debridement Rotator Cuff,  CA ligament release   TONSILLECTOMY  age 42   TRIGGER FINGER RELEASE Left 01/16/2015   Procedure: LEFT THUMB TRIGGER RELEASE ;  Surgeon: Leanora Cover, MD;  Location: [place];  Service: Orthopedics;  Laterality: Left;   VAGINAL HYSTERECTOMY  1981   Family History  Problem Relation Age of Onset   Stroke Mother    Cancer Mother        oropharyngeal   Heart attack Mother    Anesthesia problems Mother        post-op nausea   Liver disease Father    Cirrhosis Father    Cancer Sister 52       breast ca, lung ca   Cancer Brother        twin; tonsillar?   Cancer Paternal Grandmother        cervical   Cancer Other        Nephew; appendiceal carcnoid, melanoma    Social History   Tobacco Use   Smoking status: Never    Passive exposure: Yes   Smokeless tobacco: Never   Tobacco comments:    parents, husband, & multiple family members  Substance Use Topics   Alcohol use: No    Alcohol/week: 0.0 standard drinks of alcohol    Marital Status: Married  ROS  Review of Systems  Constitutional: Positive for malaise/fatigue.  Cardiovascular:  Positive for dyspnea on exertion and leg swelling (Occasional). Negative for chest pain.  Gastrointestinal:  Negative for melena.   Objective  Blood pressure 118/77, resp. rate 16, height 5' 10"$  (1.778 m), weight 142 lb (64.4 kg), SpO2 95 %. Body mass index is 20.37 kg/m.     02/16/2023   10:28 AM 02/04/2023    7:34 AM 02/04/2023    4:37 AM  Vitals with BMI  Height 5' 10"$     Weight 142 lbs    BMI [id number]    Systolic [id number] 98  [id number]  Diastolic 77 59 73  Pulse  77 83   Orthostatic VS for the past 72 hrs (Last 3 readings):  Orthostatic BP Patient Position BP Location Cuff Size Orthostatic Pulse  02/16/23 1036 107/68 Standing Left Arm Normal 83  02/16/23 1035 120/70 Sitting Left Arm Normal 82  02/16/23 1033 120/73 Supine Left Arm Normal 76      Physical Exam Neck:     Vascular: No carotid bruit or JVD.  Cardiovascular:     Rate and Rhythm: Normal rate and regular rhythm.     Pulses: Intact distal pulses.     Heart sounds: Normal heart sounds. No murmur heard.    No gallop.  Pulmonary:     Effort: Pulmonary effort is normal.     Breath sounds: Normal breath sounds.  Abdominal:     General: Bowel sounds are normal.     Palpations: Abdomen is soft.  Musculoskeletal:     Right lower leg: No edema.     Left lower leg: No edema.      Laboratory examination:   Recent Labs    01/28/23 0933  NA 140  K 4.1  CL 103  CO2 30  GLUCOSE 89  BUN 11  CREATININE 0.93  CALCIUM 9.1  GFRNONAA >60      Latest Ref Rng & Units 01/28/2023    9:33 AM 12/07/2019    5:59 AM 12/05/2019    3:54 AM  CMP  Glucose 70 - 99 mg/dL 89  101  83   BUN 8 - 23 mg/dL 11  5  13   $ Creatinine 0.44 - 1.00 mg/dL 0.93  0.94  0.91   Sodium 135 - 145 mmol/L 140  140  143   Potassium 3.5 - 5.1 mmol/L 4.1  4.1  3.1   Chloride 98 - 111 mmol/L 103  105  111   CO2 22 - 32 mmol/L 30  27  24    $ Calcium 8.9 - 10.3 mg/dL 9.1  8.8  7.6   Total Protein 6.5 - 8.1 g/dL   5.3   Total Bilirubin 0.3 - 1.2 mg/dL   0.2   Alkaline Phos 38 - 126 U/L   67   AST 15 - 41 U/L   13   ALT 0 - 44 U/L   13       Latest Ref Rng & Units 01/28/2023    9:33 AM 12/05/2019    3:54 AM 12/04/2019    4:33 PM  CBC  WBC 4.0 - 10.5 K/uL 5.6  7.0  8.8   Hemoglobin 12.0 - 15.0 g/dL 14.5  12.9  15.1   Hematocrit 36.0 - 46.0 % 44.3  40.0  45.5   Platelets 150 - 400 K/uL 214  226  292     External labs:   Cholesterol, total 202.000 m 10/05/2022 HDL 75.000 mg 10/05/2022 LDL 113.000 m 10/05/2022 Triglycerides 68.000 mg 10/05/2022  A1C 5.500 % 02/09/2021 TSH 2.300 10/05/2022  Medications and allergies   Allergies  Allergen Reactions   Other Shortness Of Breath    PURPLE LETTUCE   Betaseron [Interferon Beta-1b] Other (See Comments)    HARD NODULAR AREAS AT INJECTION SITE   Codeine Nausea And Vomiting   Gilenya [Fingolimod Hydrochloride] Other (See Comments)    MACULAR EDEMA   Morphine And Related Other (See Comments)    IV ROUTE - RED STREAKS OF VEIN   Sumatriptan Nausea Only    INJECTABLE ONLY - RAPID HEART RATE, FLUSHING  Tysabri [Natalizumab] Other (See Comments)    DEVELOPED JCV ANTIBODY   Zinc Nicotinate [Zinc]    Latex Rash     Medication   Current Outpatient Medications:    baclofen (LIORESAL) 10 MG tablet, Take 0.5-1 tablets (5-10 mg total) by mouth 3 (three) times daily as needed. (Patient taking differently: Take 10 mg by mouth at bedtime.), Disp: 90 tablet, Rfl: 1   Biotin 5000 MCG CAPS, Take 5,000 mcg by mouth daily., Disp: , Rfl:    caffeine 200 MG TABS tablet, Take 200 mg by mouth daily., Disp: , Rfl:    calcium carbonate (OS-CAL) 600 MG TABS tablet, Take 1,200 mg by mouth. , Disp: , Rfl:    cyclobenzaprine (FLEXERIL) 5 MG tablet, Take 1 tablet (5 mg total) by mouth 3 (three) times daily as needed for muscle spasms., Disp: 30 tablet, Rfl: 0   denosumab (PROLIA) 60 MG/ML SOLN  injection, Inject 60 mg into the skin every 6 (six) months. Administer in upper arm, thigh, or abdomen, Disp: , Rfl:    Dexlansoprazole 30 MG capsule DR, Take 30 mg by mouth daily., Disp: , Rfl:    docusate sodium (COLACE) 100 MG capsule, Take 1 capsule (100 mg total) by mouth 2 (two) times daily., Disp: 30 capsule, Rfl: 0   donepezil (ARICEPT) 10 MG tablet, TAKE 1 TABLET BY MOUTH EVERY NIGHT AT BEDTIME, Disp: 90 tablet, Rfl: 4   estradiol (ESTRACE) 0.1 MG/GM vaginal cream, Place 1 Applicatorful vaginally 3 (three) times a week., Disp: , Rfl:    loratadine (CLARITIN) 10 MG tablet, Take 10 mg by mouth daily., Disp: , Rfl:    methylphenidate (RITALIN) 10 MG tablet, Take 1 tablet (10 mg total) by mouth 2 (two) times daily., Disp: 60 tablet, Rfl: 0   Multiple Vitamins-Minerals (MULTIVITAMIN PO), Take 1 tablet by mouth daily. Brand name - Mature Multivitamin from [employer], Disp: , Rfl:    oxyCODONE-acetaminophen (PERCOCET/ROXICET) 5-325 MG tablet, Take 1-2 tablets by mouth every 4 (four) hours as needed for moderate pain., Disp: 30 tablet, Rfl: 0   Probiotic Product (Rayne PO), Take 1 tablet by mouth every morning., Disp: , Rfl:    rOPINIRole (REQUIP) 0.25 MG tablet, TAKE 1 TO 2 TABLETS BY MOUTH AT NIGHT (Patient taking differently: Take 0.5 mg by mouth at bedtime. TAKE 1 TO 2 TABLETS BY MOUTH AT NIGHT), Disp: 180 tablet, Rfl: 3   rosuvastatin (CRESTOR) 20 MG tablet, Take 20 mg by mouth at bedtime., Disp: , Rfl:    vitamin B-12 (CYANOCOBALAMIN) 1000 MCG tablet, Take 1,000 mcg by mouth daily., Disp: , Rfl:    VITAMIN D PO, Take 2,000 Units by mouth daily., Disp: , Rfl:    ZETIA 10 MG tablet, Take 10 mg by mouth daily., Disp: , Rfl:    lamoTRIgine (LAMICTAL) 200 MG tablet, Take 1 tablet (200 mg total) by mouth 2 (two) times daily. (Patient not taking: Reported on 02/16/2023), Disp: 180 tablet, Rfl: 3    Radiology:   No results found.  Cardiac Studies:   Coronary angiography  22-May-2019: Normal coronary arteries, LV gram revealing mid chamber hypokinesis, consistent with stress cardiomyopathy or myopericarditis.  Coronary calcium score 09/10/2020 Left Main: 0 LAD: 7.64 LCx: 0 RCA: 0 Total Agatston Score: 7.64. MESA database percentile: 51   AORTA MEASUREMENTS:  Ascending Aorta: 33 mm Descending Aorta: 22 mm  PCV ECHOCARDIOGRAM COMPLETE 12/03/2021  Narrative Echocardiogram 12/03/2021: Left ventricle cavity is normal in size and wall thickness. Normal global wall  motion. Normal LV systolic function with EF 65%. Normal diastolic filling pattern, normal LAP. Trileaflet aortic valve. Mild aortic valve leaflet calcification. Mild (Grade I) aortic regurgitation. Mild to moderate tricuspid regurgitation. Estimated pulmonary artery systolic pressure 25 mmHg.  EKG:   EKG 02/16/2023: Normal sinus rhythm at rate of 78 bpm, normal axis, incomplete right bundle branch block.  Normal EKG.  Compared to 11/25/2021, no significant change.   Assessment     ICD-10-CM   1. Vasovagal syncope  R55 EKG 12-Lead    2. Malaise and fatigue  R53.81 Ambulatory referral to Sleep Studies   R53.83     3. Multiple sclerosis ([place])  [place] Ambulatory referral to Sleep Studies      Medications Discontinued During This Encounter  Medication Reason   tamsulosin (FLOMAX) 0.4 MG CAPS capsule     No orders of the defined types were placed in this encounter.  Orders Placed This Encounter  Procedures   Ambulatory referral to Sleep Studies    Referral Priority:   Routine    Referral Type:   Consultation    Referral Reason:   Specialty Services Required    Number of Visits Requested:   1   EKG 12-Lead   Recommendations:   [name] is a 72 y.o. Caucasian female patient with multiple sclerosis, has had history of transverse myelitis, residual bilateral leg weakness left worse than the right, last cardiac admission with "STEMI" in April 2020 revealing normal coronary arteries  and mid anterior hypokinesis of the left ventricle, suggestive of either Takotsubo or myopericarditis.  Repeat echocardiogram had complete normalization of LVEF in 2020.  Patient referred to me for evaluation of recurrent syncope.  1. Vasovagal syncope Patient symptoms of near syncope and syncope, last episode a month ago prior to fortunately cervical decompression surgery is clearly related to vasovagal episode.  All her episodes so far 3 episodes of near syncope and syncope are clearly preceded by feeling dizzy, mild diaphoresis, abdominal discomfort, and an episode of diarrhea as well prior to syncope I will suggest vasovagal episode and do not suggest cardiac etiology.  Carotid artery massage did not reveal significant pauses although she did have a mild bradycardic episode and recurrence of mild symptoms.  Hence do not suspect carotid hypersensitivity syndrome.  Counterpressure maneuver, avoidance of fall, empirically trying B1, B6, and B12 for 3 to 6 months discussed with the patient.  No further evaluation from cardiac standpoint is indicated.  2. Malaise and fatigue She continues to endorse malaise and fatigue, it may be worthwhile to consider a sleep study, I have taken the liberty of referring her to [place] [facility] for evaluation as she also has multiple sclerosis and may have central sleep apnea as well.  3. Multiple sclerosis ([place]) As dictated above. If multiple sclerosis and deconditioning and sleep apnea are excluded, would then consider referral to physical rehabilitation.  Thank you for the consultation.    Adrian Prows, MD, [facility] 02/16/2023, 11:27 AM Office: [phone]

## 2023-02-22 ENCOUNTER — Other Ambulatory Visit: Payer: Self-pay | Admitting: Family Medicine

## 2023-02-22 MED ORDER — METHYLPHENIDATE HCL 10 MG PO TABS: 10.0000 mg | ORAL_TABLET | Freq: Two times a day (BID) | ORAL | 0 refills | Status: DC

## 2023-02-22 NOTE — Telephone Encounter (Signed)
Pt request refill for  methylphenidate (RITALIN) 10 MG tablet at  [place] [phone]

## 2023-02-22 NOTE — Telephone Encounter (Signed)
Last seen 12/14/22 and next f/u 06/21/23. Per drug registry, last refilled 01/10/23 #60.

## 2023-02-25 DIAGNOSIS — S129XXD Fracture of neck, unspecified, subsequent encounter: Secondary | ICD-10-CM | POA: Diagnosis not present

## 2023-03-07 ENCOUNTER — Other Ambulatory Visit: Payer: Self-pay | Admitting: Neurology

## 2023-03-07 NOTE — Telephone Encounter (Signed)
Per note on 12/14/22 "Continue donepezil for memory support " Follow up scheduled on 06/21/23

## 2023-03-08 ENCOUNTER — Other Ambulatory Visit: Payer: Self-pay | Admitting: *Deleted

## 2023-03-08 MED ORDER — BACLOFEN 10 MG PO TABS: 5.0000 mg | ORAL_TABLET | Freq: Three times a day (TID) | ORAL | 2 refills | Status: DC | PRN

## 2023-03-24 ENCOUNTER — Encounter: Payer: Self-pay | Admitting: Cardiology

## 2023-03-24 ENCOUNTER — Telehealth: Payer: Self-pay

## 2023-03-24 NOTE — Telephone Encounter (Signed)
Spoke to the patient, symptoms appear nonspecific, will continue to observe for now.  MyChart message also sent to the patient to reply to me over the weekend if necessary.

## 2023-03-24 NOTE — Telephone Encounter (Signed)
Patient complaining of mild SOB and weakness. He bp is running 163/93, 163/99. PCP on call told her to contact us.

## 2023-04-12 ENCOUNTER — Other Ambulatory Visit: Payer: Self-pay | Admitting: Family Medicine

## 2023-04-12 MED ORDER — METHYLPHENIDATE HCL 10 MG PO TABS: 10.0000 mg | ORAL_TABLET | Freq: Two times a day (BID) | ORAL | 0 refills | Status: DC

## 2023-04-12 NOTE — Telephone Encounter (Signed)
Last Seen 12/14/2022 Upcoming Appointment 06/21/2023  Ritalin last Filled 02/22/2023

## 2023-04-12 NOTE — Telephone Encounter (Signed)
Pt called requesting a refill for her methylphenidate (RITALIN) 10 MG tablet and have it sent to the [facility] on [address].

## 2023-05-17 ENCOUNTER — Other Ambulatory Visit: Payer: Self-pay | Admitting: Family Medicine

## 2023-05-17 MED ORDER — METHYLPHENIDATE HCL 10 MG PO TABS: 10.0000 mg | ORAL_TABLET | Freq: Two times a day (BID) | ORAL | 0 refills | Status: DC

## 2023-05-17 NOTE — Telephone Encounter (Signed)
Pt last seen on 12/14/22 Follow up scheduled on 06/21/23 Last filled on 04/12/23 #60 tablets (30 day supply)  Rx pending to be signed

## 2023-05-17 NOTE — Telephone Encounter (Signed)
Pt requesting refill on methylphenidate (RITALIN) 10 MG tablet. Should be sent to [facility] [phone]

## 2023-05-25 DIAGNOSIS — H04123 Dry eye syndrome of bilateral lacrimal glands: Secondary | ICD-10-CM | POA: Diagnosis not present

## 2023-05-25 DIAGNOSIS — Z961 Presence of intraocular lens: Secondary | ICD-10-CM | POA: Diagnosis not present

## 2023-05-25 DIAGNOSIS — H349 Unspecified retinal vascular occlusion: Secondary | ICD-10-CM | POA: Diagnosis not present

## 2023-05-27 DIAGNOSIS — N3941 Urge incontinence: Secondary | ICD-10-CM | POA: Diagnosis not present

## 2023-05-31 DIAGNOSIS — I213 ST elevation (STEMI) myocardial infarction of unspecified site: Secondary | ICD-10-CM | POA: Diagnosis not present

## 2023-05-31 DIAGNOSIS — N1831 Chronic kidney disease, stage 3a: Secondary | ICD-10-CM | POA: Diagnosis not present

## 2023-05-31 DIAGNOSIS — I700 Atherosclerosis of aorta: Secondary | ICD-10-CM | POA: Diagnosis not present

## 2023-05-31 DIAGNOSIS — G373 Acute transverse myelitis in demyelinating disease of central nervous system: Secondary | ICD-10-CM | POA: Diagnosis not present

## 2023-05-31 DIAGNOSIS — G35 Multiple sclerosis: Secondary | ICD-10-CM | POA: Diagnosis not present

## 2023-05-31 DIAGNOSIS — G609 Hereditary and idiopathic neuropathy, unspecified: Secondary | ICD-10-CM | POA: Diagnosis not present

## 2023-05-31 DIAGNOSIS — N390 Urinary tract infection, site not specified: Secondary | ICD-10-CM | POA: Diagnosis not present

## 2023-05-31 DIAGNOSIS — E785 Hyperlipidemia, unspecified: Secondary | ICD-10-CM | POA: Diagnosis not present

## 2023-05-31 DIAGNOSIS — I129 Hypertensive chronic kidney disease with stage 1 through stage 4 chronic kidney disease, or unspecified chronic kidney disease: Secondary | ICD-10-CM | POA: Diagnosis not present

## 2023-05-31 DIAGNOSIS — S129XXD Fracture of neck, unspecified, subsequent encounter: Secondary | ICD-10-CM | POA: Diagnosis not present

## 2023-05-31 DIAGNOSIS — M419 Scoliosis, unspecified: Secondary | ICD-10-CM | POA: Diagnosis not present

## 2023-05-31 DIAGNOSIS — M810 Age-related osteoporosis without current pathological fracture: Secondary | ICD-10-CM | POA: Diagnosis not present

## 2023-06-06 ENCOUNTER — Other Ambulatory Visit: Payer: Self-pay

## 2023-06-06 MED ORDER — DONEPEZIL HCL 10 MG PO TABS: 10.0000 mg | ORAL_TABLET | Freq: Every day | ORAL | 0 refills | Status: DC

## 2023-06-10 DIAGNOSIS — T85113A Breakdown (mechanical) of implanted electronic neurostimulator, generator, initial encounter: Secondary | ICD-10-CM | POA: Diagnosis not present

## 2023-06-10 DIAGNOSIS — N3941 Urge incontinence: Secondary | ICD-10-CM | POA: Diagnosis not present

## 2023-06-14 ENCOUNTER — Other Ambulatory Visit: Payer: Self-pay | Admitting: Family Medicine

## 2023-06-14 MED ORDER — METHYLPHENIDATE HCL 10 MG PO TABS: 10.0000 mg | ORAL_TABLET | Freq: Two times a day (BID) | ORAL | 0 refills | Status: DC

## 2023-06-14 NOTE — Telephone Encounter (Signed)
Pt  requesting a refill on methylphenidate (RITALIN) 10 MG tablet. Refill should be sent to  [facility] [phone]

## 2023-06-14 NOTE — Telephone Encounter (Signed)
Last seen 12/14/22 and next f/u 06/21/23.  Last refilled rx 05/18/23 #60.

## 2023-06-21 ENCOUNTER — Ambulatory Visit: Payer: PPO | Admitting: Neurology

## 2023-06-21 ENCOUNTER — Encounter: Payer: Self-pay | Admitting: Neurology

## 2023-06-27 ENCOUNTER — Ambulatory Visit: Payer: PPO | Admitting: Neurology

## 2023-06-27 ENCOUNTER — Encounter: Payer: Self-pay | Admitting: Neurology

## 2023-06-27 VITALS — BP 113/72 | HR 86 | Ht 70.0 in | Wt 135.6 lb

## 2023-06-27 DIAGNOSIS — R413 Other amnesia: Secondary | ICD-10-CM

## 2023-06-27 DIAGNOSIS — G373 Acute transverse myelitis in demyelinating disease of central nervous system: Secondary | ICD-10-CM

## 2023-06-27 DIAGNOSIS — F988 Other specified behavioral and emotional disorders with onset usually occurring in childhood and adolescence: Secondary | ICD-10-CM | POA: Diagnosis not present

## 2023-06-27 DIAGNOSIS — R32 Unspecified urinary incontinence: Secondary | ICD-10-CM

## 2023-06-27 DIAGNOSIS — Z981 Arthrodesis status: Secondary | ICD-10-CM

## 2023-06-27 NOTE — Progress Notes (Addendum)
[facility]  PATIENT: [name] DOB: May 11, 1951  REFERRING CLINICIAN: Rodrigo Ran HISTORY FROM: patient   REASON FOR VISIT: transverse myelitis, MS?   HISTORICAL  CHIEF COMPLAINT:  Chief Complaint  Patient presents with   Follow-up    Pt in room 10. Here for Transverse myelitis follow up.  Pt report she is doing well, no concerns.     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     Update 06/27/2023:  She has not had any more episodes of syncope and has felt well in general.     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    Currently, she is at baseline.  She continues to have symptoms from the transverse myelitis in the past.  She has cramps in her left foot > right.  These are worse at night.   When more severe they go up to the calf.    She feels strength is usually baseline but muscles fatigue easily.      Gait and balance are mildly off but no falls.   She is not using her cane now.    Neck pain improved after surgery by Dr. Newell Coral (had pseudoarthrosis after prior ACDF and had posterior fusion 01/2023).   She stopped the lamotrigine and noted no major difference.    Cognitively, she is doing better  She does better when she gets a good night sleep.      She is on Ritalin with some benefit.   Over the years she has had some difficulty with memory, processing and word finding.   She is driving well.     Her husband handles finances.    She also feels mood is doing well.       She has urinary incontinence.  She does note hesitancy and does not feel that she completely empties.  She has never been on tamsulosin.  In the past, Ditropan had not help.    She has a stimulator x 5 years and is seeing Dr. Sherron Monday  In April 2021 she had an MI but catheterization was fine.   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    EF =  45%.  She notes SOB with exertion.    She has had SOB, with exertion.   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           12/14/2022   11:20 AM 12/08/2021    1:08 PM 06/08/2021   11:47 AM 12/08/2020   11:16 AM 09/17/2019    4:57 PM  Montreal Cognitive Assessment   Visuospatial/ Executive (0/5) 3 4 4 2 2   Naming (0/3) 3 3 3 3 3   Attention: Read list of digits (0/2) 2 2 2 1  0  Attention: Read list of letters (0/1) 1 1 1 1 1   Attention: Serial 7 subtraction starting at 100 (0/3) 1 1 1 1 1   Language: Repeat phrase (0/2) 1 2 1  0 0  Language : Fluency (0/1) 1 1 1  0 0  Abstraction (0/2) 2 2 2 2  0  Delayed Recall (0/5) 3 3 5 4 3   Orientation (0/6) 6 6 6 5 6   Total 23 25 26 19 16   Adjusted Score (based on education) 23 25 26 19  16  TM/MS History:  In 1989, she had severe numbness in one side of her body.  She had clumsiness and poor gait.  MRI was reportedly normal.     She saw Dr. Sandria Manly who did an LP that showed oligoclonal bands.   She received IV steroids and was told she had MS.   In 1993, she woke up numb from the waist down in both legs.  This numbness was more intense than the prior episodes and gait was very poor.    She was admitted x 28 days, receiving many days of IV steroids followed by Rehab.     An MRI of the brain was normal but the MRI of the thoracic spine showed a plaque at T4.     She was started on Betaseron and did well in general.  She had some fluctuating symptom but nothing resembling any of the other 3 episodes.   In November 2014 she had additional imaging studies showing a normal MRI of the brain, an abnormal MRI of the thoracic spine with a focus at T4, and a normal cervical spinal cord. She did have evidence of prior fusion surgery in the neck.  MRI of the cervical spine in September 2016 showed stable postoperative ACDF at C4-C5. The spinal cord appeared normal.   There was no myelopathy in the  cervical spine. She also underwent a lumbar puncture in November 2014. It was normal and did not show oligoclonal bands or increased IgG index.    She has been treated with Betaseron, Avonex, Tysabri and Gilenya for presumptive MS in the past.   She stopped Tysabri as was JCV Ab positive and stopped Gilenya as she had macula edema.   She tried [place] but stopped due to insurance.  No DMT since 2014.    We have discussed that she appears to have isolated transverse myelitis rather than MS.  Imaging: MRI of the brain 09/15/2015 showed normal brain for age.  MRI of the cervical spine 09/25/2015 showed ACDF.  The spinal cord was normal.  My of the thoracic spine 02/19/2015 showed a single focus adjacent to T4 consistent with a chronic demyelinating plaque or chronic myelitis.  No change compared to 2014.  CT myelogram cervical spine 04/20/2019 showed a left central disc protrusion at C6-C7 effacing the ventral CSF.  There is no foraminal narrowing.  There is moderate foraminal narrowing bilaterally at C5-C6 but no spinal stenosis.  X-ray 05/17/2019 showed prior fusion at C3-C5 and fusion at C5-C7 associated with anterior fixation hardware.  MRI cervical spine 06/18/2021 shows stable ACDF.  She is fused C3-C7.  No spinal stenosis or nerve root  compression.      Other: EEG 07/28/2022 was normal  REVIEW OF SYSTEMS:  Constitutional: No fevers, chills, sweats, or change in .  She reports fatigue Eyes: as above Ear, nose and throat: No hearing loss, ear pain, nasal congestion, sore throat Cardiovascular: No chest pain, palpitations Respiratory:  No shortness of breath at rest or with exertion.   No wheezes GastrointestinaI: No nausea, vomiting, diarrhea, abdominal pain.  Reports fecal incontinence.   Reports dysphagia Genitourinary:  see above Musculoskeletal:  No neck pain, back pain.   Hasmuscle cramps Integumentary: No rash, pruritus, skin lesions Neurological: as above Psychiatric: No depression at  this time.  No anxiety Endocrine: No palpitations, diaphoresis, change in appetite, change in weigh or increased thirst Hematologic/Lymphatic:  No anemia, purpura, petechiae. Allergic/Immunologic: No itchy/runny eyes, nasal congestion, recent allergic reactions, rashes  ALLERGIES: Allergies  Allergen Reactions   Other Shortness Of Breath    PURPLE LETTUCE   Betaseron [Interferon Beta-1b] Other (See Comments)    HARD NODULAR AREAS AT INJECTION SITE   Codeine Nausea And Vomiting   Gilenya [Fingolimod Hydrochloride] Other (See Comments)    MACULAR EDEMA   Morphine And Codeine Other (See Comments)    IV ROUTE - RED STREAKS OF VEIN   Sumatriptan Nausea Only    INJECTABLE ONLY - RAPID HEART RATE, FLUSHING   Tysabri [Natalizumab] Other (See Comments)    DEVELOPED JCV ANTIBODY   Zinc Nicotinate [Zinc]    Latex Rash    HOME MEDICATIONS: Outpatient Medications Prior to Visit  Medication Sig Dispense Refill   baclofen (LIORESAL) 10 MG tablet Take 0.5-1 tablets (5-10 mg total) by mouth 3 (three) times daily as needed. 90 tablet 2   Biotin 5000 MCG CAPS Take 5,000 mcg by mouth daily.     denosumab (PROLIA) 60 MG/ML SOLN injection Inject 60 mg into the skin every 6 (six) months. Administer in upper arm, thigh, or abdomen     Dexlansoprazole 30 MG capsule DR Take 30 mg by mouth daily.     docusate sodium (COLACE) 100 MG capsule Take 1 capsule (100 mg total) by mouth 2 (two) times daily. 30 capsule 0   donepezil (ARICEPT) 10 MG tablet Take 1 tablet (10 mg total) by mouth at bedtime. 90 tablet 0   estradiol (ESTRACE) 0.1 MG/GM vaginal cream Place 1 Applicatorful vaginally 3 (three) times a week.     loratadine (CLARITIN) 10 MG tablet Take 10 mg by mouth daily.     methylphenidate (RITALIN) 10 MG tablet Take 1 tablet (10 mg total) by mouth 2 (two) times daily. 60 tablet 0   Multiple Vitamins-Minerals (MULTIVITAMIN PO) Take 1 tablet by mouth daily. Brand name - Mature Multivitamin from [employer]      Probiotic Product (PHILLIPS COLON HEALTH PO) Take 1 tablet by mouth every morning.     rOPINIRole (REQUIP) 0.25 MG tablet TAKE 1 TO 2 TABLETS BY MOUTH AT NIGHT (Patient taking differently: Take 0.5 mg by mouth at bedtime. TAKE 1 TO 2 TABLETS BY MOUTH AT NIGHT) 180 tablet 3   rosuvastatin (CRESTOR) 20 MG tablet Take 20 mg by mouth at bedtime.     vitamin B-12 (CYANOCOBALAMIN) 1000 MCG tablet Take 1,000 mcg by mouth daily.     VITAMIN D PO Take 2,000 Units by mouth daily.     ZETIA 10 MG tablet Take 10 mg by mouth daily.     caffeine 200 MG TABS tablet Take 200 mg by mouth daily. (Patient not taking: Reported on 06/27/2023)     calcium carbonate (OS-CAL) 600 MG TABS tablet Take 1,200 mg by mouth.  (Patient not taking: Reported on 06/27/2023)     cyclobenzaprine (FLEXERIL) 5 MG tablet Take 1 tablet (5 mg total) by mouth 3 (three) times daily as needed for muscle spasms. (Patient not taking: Reported on 06/27/2023) 30 tablet 0   lamoTRIgine (LAMICTAL) 200 MG tablet Take 1 tablet (200 mg total) by mouth 2 (two) times daily. (Patient not taking: Reported on 02/16/2023) 180 tablet 3   oxyCODONE-acetaminophen (PERCOCET/ROXICET) 5-325 MG tablet Take 1-2 tablets by mouth every 4 (four) hours as needed for moderate pain. (Patient not taking: Reported on 06/27/2023) 30 tablet 0   No facility-administered medications prior to visit.    PAST MEDICAL HISTORY: Past Medical History:  Diagnosis Date   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   Arthritis  back   Asthma    20 yrs., ago one episode   Cancer [facility])    Appendicial   Chronic migraine headaches 03/13/2008   Chronic rhinitis 03/13/2008   Deviated septum 01/23/2020   Dysesthesia    bilateral feet   Dysphagia, oropharyngeal phase 10/21/2009   Dyspnea    Gastroesophageal reflux disease 09/10/2008   Heart murmur    first noticed 41 yrs. when pregnant, can't hear it now   History of colon polyps    2013;  2015   History of hiatal hernia    History  of neoplasm    06-22-2011  s/p  appendectomy --  per path , low grade appendiceal mucinous neoplasm    HNP (herniated nucleus pulposus), cervical 05/17/2019   Hypokalemia 12/04/2019   Mitral valve prolapse    states no problem   Multiple sclerosis 1989   Myocarditis 04/25/2019   Neurogenic bladder    Obstructive chronic bronchitis with exacerbation 09/10/2008   Qualifier: Diagnosis of  By: Delford Field MD, Charlcie Cradle    Occipital neuralgia 05/06/2015   Osteoporosis    Pneumonia    PONV (postoperative nausea and vomiting)    Pulmonary nodule, right upper lobe 01/22/2009   Short of breath on exertion    Sjogren's disease    dryness of eyes, mouth   Spastic gait    Transverse myelitis    T4   Urinary frequency 02/05/2015   Vitamin D deficiency 12/09/2016   Vocal cord disorder 10/21/2009   Wears bilateral ankle braces    AFO braces for stability    PAST SURGICAL HISTORY: Past Surgical History:  Procedure Laterality Date   ANAL RECTAL MANOMETRY N/A 08/18/2016   Procedure: ANO RECTAL MANOMETRY;  Surgeon: Romie Levee, MD;  Location: [facility];  Service: Endoscopy;  Laterality: N/A;   ANTERIOR CERVICAL DECOMP/DISCECTOMY FUSION  2004;   2003   C4 -- C5 (2004)/   C3 -- C4 (2003)   ANTERIOR CERVICAL DECOMP/DISCECTOMY FUSION N/A 05/17/2019   Procedure: EVACUATION HEMATOMA OF POST OP ANTERIOR CERVICAL DECOMPRESSION FUSION;  Surgeon: Shirlean Kelly, MD;  Location: [facility] OR;  Service: Neurosurgery;  Laterality: N/A;   ANTERIOR CERVICAL DECOMP/DISCECTOMY FUSION N/A 05/17/2019   Procedure: REMOVAL OF TETHER CERVICAL PLATE, ANTERIOR CERVICAL DECOMPRESSION/DISCECTOMY FUSION CERVICAL FIVE- CERVICAL SIX, CERVICAL SIX- CERVICAL SEVEN;  Surgeon: Shirlean Kelly, MD;  Location: [facility] OR;  Service: Neurosurgery;  Laterality: N/A;   APPENDECTOMY  06/22/2011   laparoscopic   BILATERAL SALPINGOOPHORECTOMY  1982   BLADDER SUSPENSION  1994   sling   BREAST ENHANCEMENT SURGERY Bilateral    BREAST IMPLANT  REMOVAL Bilateral 12/03/2008   BUNIONECTOMY     CATARACT EXTRACTION W/ INTRAOCULAR LENS  IMPLANT, BILATERAL  2015   ELBOW SURGERY Right    HEMORROIDECTOMY  08/05/2010;  2013   INSERTION SACRAL NERVE STIMULATOR TEST WIRE  09-28-2016   dr Maisie Fus   INTERCOSTAL NERVE BLOCK  2015   occipital   MENISCUS REPAIR Left 09/26/2018   POSTERIOR CERVICAL FUSION/FORAMINOTOMY N/A 02/03/2023   Procedure: POSTERIOR CERVICAL FUSION WITH LATERAL MASS FIXATION CERVICAL SIX-CERVICAL SEVEN; EXPLORE CERVICAL FIVE-CERVICAL SIX WITH POSSIBLE  FUSION;  Surgeon: Tressie Stalker, MD;  Location: [facility] OR;  Service: Neurosurgery;  Laterality: N/A;   RECTAL ULTRASOUND N/A 08/18/2016   Procedure: RECTAL ULTRASOUND;  Surgeon: Romie Levee, MD;  Location: [facility];  Service: Endoscopy;  Laterality: N/A;   SHOULDER ARTHROSCOPY Left    SHOULDER ARTHROSCOPY WITH DISTAL CLAVICLE RESECTION Right 09/21/2007   w/  Acrominoplasty,  Debridement Rotator Cuff,  CA ligament release   TONSILLECTOMY  age 63   TRIGGER FINGER RELEASE Left 01/16/2015   Procedure: LEFT THUMB TRIGGER RELEASE ;  Surgeon: Betha Loa, MD;  Location: [facility];  Service: Orthopedics;  Laterality: Left;   VAGINAL HYSTERECTOMY  1981    FAMILY HISTORY: Family History  Problem Relation Age of Onset   Stroke Mother    Cancer Mother        oropharyngeal   Heart attack Mother    Anesthesia problems Mother        post-op nausea   Liver disease Father    Cirrhosis Father    Cancer Sister 41       breast ca, lung ca   Cancer Brother        twin; tonsillar?   Cancer Paternal Grandmother        cervical   Cancer Other        Nephew; appendiceal carcnoid, melanoma    SOCIAL HISTORY:  Social History   Socioeconomic History   Marital status: Married    Spouse name: Tom   Number of children: 2   Years of education: 13   Highest education level: Some college, no degree  Occupational History   Occupation: Retired  Tobacco Use   Smoking  status: Never    Passive exposure: Yes   Smokeless tobacco: Never   Tobacco comments:    parents, husband, & multiple family members  Vaping Use   Vaping Use: Never used  Substance and Sexual Activity   Alcohol use: No    Alcohol/week: 0.0 standard drinks of alcohol   Drug use: No   Sexual activity: Not on file  Other Topics Concern   Not on file  Social History Narrative   Pt lives at home with her spouse of 37 years.   Caffeine Use- Drinks caffeine occasionally.      [facility] [facility]:   From [place]. Previously did administrative work. She currently has a cat & dog. No bird exposure. No mold or hot tub exposure. No carpet or draperies.       Social Determinants of Health   Financial Resource Strain: Not on file  Food Insecurity: Not on file  Transportation Needs: Not on file  Physical Activity: Not on file  Stress: Not on file  Social Connections: Not on file  Intimate Partner Violence: Not on file     PHYSICAL EXAM  Vitals:   06/27/23 1006  BP: 113/72  Pulse: 86  Weight: 135 lb 9.6 oz (61.5 kg)  Height: 5\' 10"  (1.778 m)     Body mass index is 19.46 kg/m.   General: The patient is well-developed and well-nourished and in no acute distress  Neck:    She has tenderness in the occiput, right greater than left.Marland Kitchen ROM is reduced in the neck..    Neurologic Exam  Mental status: The patient is alert and oriented at the time of the examination.  She scored 25 /30 on the [facility] cognitive assessment (details are above).  ..  Speech was normal..    Cranial nerves: Extraocular movements are full.  Facial strength is normal.. No dysarthria is noted.    Motor:  Very mild essential tremor noted with writing.   Muscle bulk and tone are normal.  Strength was5/5 in arms  Strength was 5/5 in the left arm.  Rapid altering movements were normal I hands   Strength in her legs was 4+/5 p   Sensory:  She has reduced sensation to touch below the mid thoracic level.  Mild reduced  left arm sensation.  She has reduced vibration sensation in both legs symmetrically worse on left.   Coordination: Cerebellar testing shows good finger-nose-finger but reduced heel-to-shin bilaterally..  Gait and station: Station is stable eyes open.  Foot drops are better and she can walk without cane and has a better turn.   Romberg is positive.  Reflexes: Deep tendon reflexes are symmetric and normal in the arms but increased at the legs with crossed abductors at the knees.  No ankle clonus       ASSESSMENT AND PLAN  Transverse myelitis (HCC)  Urinary incontinence, unspecified type  Attention deficit disorder, unspecified hyperactivity presence  Memory loss  History of fusion of cervical spine   1.   She has transverse myelitis (T4) with fairly stable exam..  She has not developed other lesions so MS is unlikely.  2.   Continue methylphenidate prn for ADD.   Continue ropinirole for restless legs   Add baclofen prn for spasms 3.   Gait is better an she no longer needs her cane 4.    RTC 6 months, sooner if problems or based on the results.  This visit is part of a comprehensive longitudinal care medical relationship regarding the patients primary diagnosis of transverse myelitis and related concerns.   Taquanna Borras A. Epimenio Foot, MD, PhD 06/27/2023, 10:21 AM Certified in Neurology, Clinical Neurophysiology, Sleep Medicine, Pain Medicine and Neuroimaging  [facility] [address], [address] [place], [place] [address] [phone]

## 2023-06-27 NOTE — Addendum Note (Signed)
Addended by: Despina Arias A on: 06/27/2023 12:36 PM   Modules accepted: Level of Service

## 2023-07-21 ENCOUNTER — Other Ambulatory Visit: Payer: Self-pay | Admitting: Neurology

## 2023-07-21 MED ORDER — METHYLPHENIDATE HCL 10 MG PO TABS: 10.0000 mg | ORAL_TABLET | Freq: Two times a day (BID) | ORAL | 0 refills | Status: DC

## 2023-07-21 NOTE — Telephone Encounter (Signed)
Pt is needing her  methylphenidate (RITALIN) 10 MG tablet called in to the [facility] on [address].

## 2023-07-21 NOTE — Telephone Encounter (Signed)
Patient last seen on 06/27/23 Follow up scheduled on 01/10/24 Last filled on 06/22/2023 #60 tablets (30 day supply) Rx pending to be signed

## 2023-07-22 DIAGNOSIS — N3941 Urge incontinence: Secondary | ICD-10-CM | POA: Diagnosis not present

## 2023-07-22 DIAGNOSIS — R350 Frequency of micturition: Secondary | ICD-10-CM | POA: Diagnosis not present

## 2023-08-04 ENCOUNTER — Other Ambulatory Visit (HOSPITAL_COMMUNITY): Payer: Self-pay

## 2023-08-07 ENCOUNTER — Other Ambulatory Visit: Payer: Self-pay | Admitting: Neurology

## 2023-08-08 ENCOUNTER — Ambulatory Visit (HOSPITAL_COMMUNITY)
Admission: RE | Admit: 2023-08-08 | Discharge: 2023-08-08 | Disposition: A | Discharge status: Home / Self Care | Payer: PPO | Source: Ambulatory Visit | Attending: Internal Medicine | Admitting: Internal Medicine

## 2023-08-08 DIAGNOSIS — M810 Age-related osteoporosis without current pathological fracture: Secondary | ICD-10-CM | POA: Diagnosis not present

## 2023-08-08 DIAGNOSIS — Z79620 Long term (current) use of immunosuppressive biologic: Secondary | ICD-10-CM | POA: Insufficient documentation

## 2023-08-08 MED ORDER — DENOSUMAB 60 MG/ML ~~LOC~~ SOSY
PREFILLED_SYRINGE | SUBCUTANEOUS | Status: AC
  Filled 2023-08-08: qty 1

## 2023-08-08 MED ORDER — DENOSUMAB 60 MG/ML ~~LOC~~ SOSY
60.0000 mg | PREFILLED_SYRINGE | Freq: Once | SUBCUTANEOUS | Status: AC
  Administered 2023-08-08: 60 mg via SUBCUTANEOUS

## 2023-08-09 NOTE — Telephone Encounter (Signed)
Last seen on 06/27/23 Follow up scheduled on 01/10/24

## 2023-08-12 DIAGNOSIS — H349 Unspecified retinal vascular occlusion: Secondary | ICD-10-CM | POA: Diagnosis not present

## 2023-08-12 DIAGNOSIS — H26491 Other secondary cataract, right eye: Secondary | ICD-10-CM | POA: Diagnosis not present

## 2023-08-12 DIAGNOSIS — Z961 Presence of intraocular lens: Secondary | ICD-10-CM | POA: Diagnosis not present

## 2023-08-30 ENCOUNTER — Other Ambulatory Visit: Payer: Self-pay | Admitting: Neurology

## 2023-08-30 DIAGNOSIS — N3941 Urge incontinence: Secondary | ICD-10-CM | POA: Diagnosis not present

## 2023-08-30 DIAGNOSIS — T85113A Breakdown (mechanical) of implanted electronic neurostimulator, generator, initial encounter: Secondary | ICD-10-CM | POA: Diagnosis not present

## 2023-08-30 DIAGNOSIS — T85193A Other mechanical complication of implanted electronic neurostimulator, generator, initial encounter: Secondary | ICD-10-CM | POA: Diagnosis not present

## 2023-08-30 MED ORDER — METHYLPHENIDATE HCL 10 MG PO TABS: 10.0000 mg | ORAL_TABLET | Freq: Two times a day (BID) | ORAL | 0 refills | Status: DC

## 2023-08-30 NOTE — Telephone Encounter (Signed)
Pt is requesting a refill for methylphenidate (RITALIN) 10 MG tablet .  Pharmacy: [place] [phone]

## 2023-08-30 NOTE — Addendum Note (Signed)
Addended by: Aura Camps on: 08/30/2023 05:15 PM   Modules accepted: Orders

## 2023-08-30 NOTE — Telephone Encounter (Addendum)
Pt last seen on 06/27/23 Follow up scheduled on 01/10/24 Last filled on 07/22/23 #60 tablets (30 day supply) Rx pending to be signed

## 2023-09-07 DIAGNOSIS — R350 Frequency of micturition: Secondary | ICD-10-CM | POA: Diagnosis not present

## 2023-09-07 DIAGNOSIS — N3941 Urge incontinence: Secondary | ICD-10-CM | POA: Diagnosis not present

## 2023-09-12 DIAGNOSIS — R911 Solitary pulmonary nodule: Secondary | ICD-10-CM | POA: Diagnosis not present

## 2023-09-12 DIAGNOSIS — G373 Acute transverse myelitis in demyelinating disease of central nervous system: Secondary | ICD-10-CM | POA: Diagnosis not present

## 2023-09-12 DIAGNOSIS — J45998 Other asthma: Secondary | ICD-10-CM | POA: Diagnosis not present

## 2023-09-12 DIAGNOSIS — I129 Hypertensive chronic kidney disease with stage 1 through stage 4 chronic kidney disease, or unspecified chronic kidney disease: Secondary | ICD-10-CM | POA: Diagnosis not present

## 2023-09-12 DIAGNOSIS — J302 Other seasonal allergic rhinitis: Secondary | ICD-10-CM | POA: Diagnosis not present

## 2023-09-12 DIAGNOSIS — J069 Acute upper respiratory infection, unspecified: Secondary | ICD-10-CM | POA: Diagnosis not present

## 2023-09-12 DIAGNOSIS — G35 Multiple sclerosis: Secondary | ICD-10-CM | POA: Diagnosis not present

## 2023-09-12 DIAGNOSIS — N1831 Chronic kidney disease, stage 3a: Secondary | ICD-10-CM | POA: Diagnosis not present

## 2023-09-12 DIAGNOSIS — R051 Acute cough: Secondary | ICD-10-CM | POA: Diagnosis not present

## 2023-09-20 DIAGNOSIS — N3941 Urge incontinence: Secondary | ICD-10-CM | POA: Diagnosis not present

## 2023-09-20 DIAGNOSIS — R350 Frequency of micturition: Secondary | ICD-10-CM | POA: Diagnosis not present

## 2023-10-04 ENCOUNTER — Other Ambulatory Visit: Payer: Self-pay | Admitting: Neurology

## 2023-10-04 MED ORDER — METHYLPHENIDATE HCL 10 MG PO TABS: 10.0000 mg | ORAL_TABLET | Freq: Two times a day (BID) | ORAL | 0 refills | Status: DC

## 2023-10-04 NOTE — Telephone Encounter (Signed)
Pt is requesting a refill for methylphenidate (RITALIN) 10 MG tablet .  Pharmacy: [place] [phone]

## 2023-10-04 NOTE — Telephone Encounter (Signed)
Last seen 06/27/23 and next f/u 01/10/24. Last refilled 08/30/23 #60.

## 2023-11-08 ENCOUNTER — Other Ambulatory Visit: Payer: Self-pay | Admitting: Neurology

## 2023-11-08 MED ORDER — METHYLPHENIDATE HCL 10 MG PO TABS: 10.0000 mg | ORAL_TABLET | Freq: Two times a day (BID) | ORAL | 0 refills | Status: DC

## 2023-11-08 NOTE — Telephone Encounter (Signed)
Last seen on 06/27/23 Follow up scheduled on 01/10/24 Last filled on 10/04/23 #60 tablets (30 day supply) Rx pending to be signed

## 2023-11-08 NOTE — Telephone Encounter (Signed)
At 4:01 pm pt left a vm requesting a refill on her methylphenidate (RITALIN) 10 MG tablet to [facility] [phone]

## 2023-11-14 DIAGNOSIS — Z6820 Body mass index (BMI) 20.0-20.9, adult: Secondary | ICD-10-CM | POA: Diagnosis not present

## 2023-11-14 DIAGNOSIS — I509 Heart failure, unspecified: Secondary | ICD-10-CM | POA: Diagnosis not present

## 2023-11-14 DIAGNOSIS — I110 Hypertensive heart disease with heart failure: Secondary | ICD-10-CM | POA: Diagnosis not present

## 2023-11-25 DIAGNOSIS — I129 Hypertensive chronic kidney disease with stage 1 through stage 4 chronic kidney disease, or unspecified chronic kidney disease: Secondary | ICD-10-CM | POA: Diagnosis not present

## 2023-11-25 DIAGNOSIS — J45998 Other asthma: Secondary | ICD-10-CM | POA: Diagnosis not present

## 2023-11-25 DIAGNOSIS — R5383 Other fatigue: Secondary | ICD-10-CM | POA: Diagnosis not present

## 2023-11-25 DIAGNOSIS — G35 Multiple sclerosis: Secondary | ICD-10-CM | POA: Diagnosis not present

## 2023-11-25 DIAGNOSIS — Z1152 Encounter for screening for COVID-19: Secondary | ICD-10-CM | POA: Diagnosis not present

## 2023-11-25 DIAGNOSIS — G373 Acute transverse myelitis in demyelinating disease of central nervous system: Secondary | ICD-10-CM | POA: Diagnosis not present

## 2023-11-25 DIAGNOSIS — N1831 Chronic kidney disease, stage 3a: Secondary | ICD-10-CM | POA: Diagnosis not present

## 2023-11-25 DIAGNOSIS — J302 Other seasonal allergic rhinitis: Secondary | ICD-10-CM | POA: Diagnosis not present

## 2023-11-25 DIAGNOSIS — R051 Acute cough: Secondary | ICD-10-CM | POA: Diagnosis not present

## 2023-11-25 DIAGNOSIS — J069 Acute upper respiratory infection, unspecified: Secondary | ICD-10-CM | POA: Diagnosis not present

## 2023-11-25 DIAGNOSIS — R911 Solitary pulmonary nodule: Secondary | ICD-10-CM | POA: Diagnosis not present

## 2023-12-05 DIAGNOSIS — N1831 Chronic kidney disease, stage 3a: Secondary | ICD-10-CM | POA: Diagnosis not present

## 2023-12-05 DIAGNOSIS — E785 Hyperlipidemia, unspecified: Secondary | ICD-10-CM | POA: Diagnosis not present

## 2023-12-05 DIAGNOSIS — I129 Hypertensive chronic kidney disease with stage 1 through stage 4 chronic kidney disease, or unspecified chronic kidney disease: Secondary | ICD-10-CM | POA: Diagnosis not present

## 2023-12-05 DIAGNOSIS — E559 Vitamin D deficiency, unspecified: Secondary | ICD-10-CM | POA: Diagnosis not present

## 2023-12-05 DIAGNOSIS — M810 Age-related osteoporosis without current pathological fracture: Secondary | ICD-10-CM | POA: Diagnosis not present

## 2023-12-05 DIAGNOSIS — Z1212 Encounter for screening for malignant neoplasm of rectum: Secondary | ICD-10-CM | POA: Diagnosis not present

## 2023-12-06 ENCOUNTER — Telehealth: Payer: Self-pay | Admitting: Neurology

## 2023-12-06 DIAGNOSIS — Z01411 Encounter for gynecological examination (general) (routine) with abnormal findings: Secondary | ICD-10-CM | POA: Diagnosis not present

## 2023-12-06 DIAGNOSIS — Z1331 Encounter for screening for depression: Secondary | ICD-10-CM | POA: Diagnosis not present

## 2023-12-06 DIAGNOSIS — Z124 Encounter for screening for malignant neoplasm of cervix: Secondary | ICD-10-CM | POA: Diagnosis not present

## 2023-12-06 DIAGNOSIS — Z90711 Acquired absence of uterus with remaining cervical stump: Secondary | ICD-10-CM | POA: Diagnosis not present

## 2023-12-06 DIAGNOSIS — Z01419 Encounter for gynecological examination (general) (routine) without abnormal findings: Secondary | ICD-10-CM | POA: Diagnosis not present

## 2023-12-06 MED ORDER — BACLOFEN 10 MG PO TABS: 5.0000 mg | ORAL_TABLET | Freq: Three times a day (TID) | ORAL | 0 refills | Status: DC | PRN

## 2023-12-06 NOTE — Telephone Encounter (Signed)
Pt is requesting a refill for  baclofen (LIORESAL) 10 MG tablet .  Pharmacy: [facility] [phone]

## 2023-12-06 NOTE — Telephone Encounter (Signed)
Last seen on 06/27/23 Follow up scheduled on 01/10/24

## 2023-12-08 NOTE — Telephone Encounter (Signed)
Pt requesting refill of methylphenidate (RITALIN) 10 MG tablet to be called into [facility] [phone]

## 2023-12-12 DIAGNOSIS — N1831 Chronic kidney disease, stage 3a: Secondary | ICD-10-CM | POA: Diagnosis not present

## 2023-12-12 DIAGNOSIS — E785 Hyperlipidemia, unspecified: Secondary | ICD-10-CM | POA: Diagnosis not present

## 2023-12-12 DIAGNOSIS — G373 Acute transverse myelitis in demyelinating disease of central nervous system: Secondary | ICD-10-CM | POA: Diagnosis not present

## 2023-12-12 DIAGNOSIS — G609 Hereditary and idiopathic neuropathy, unspecified: Secondary | ICD-10-CM | POA: Diagnosis not present

## 2023-12-12 DIAGNOSIS — G35 Multiple sclerosis: Secondary | ICD-10-CM | POA: Diagnosis not present

## 2023-12-12 DIAGNOSIS — I129 Hypertensive chronic kidney disease with stage 1 through stage 4 chronic kidney disease, or unspecified chronic kidney disease: Secondary | ICD-10-CM | POA: Diagnosis not present

## 2023-12-12 DIAGNOSIS — J45998 Other asthma: Secondary | ICD-10-CM | POA: Diagnosis not present

## 2023-12-12 DIAGNOSIS — Z0000 Encounter for general adult medical examination without abnormal findings: Secondary | ICD-10-CM | POA: Diagnosis not present

## 2023-12-12 DIAGNOSIS — I959 Hypotension, unspecified: Secondary | ICD-10-CM | POA: Diagnosis not present

## 2023-12-12 DIAGNOSIS — M810 Age-related osteoporosis without current pathological fracture: Secondary | ICD-10-CM | POA: Diagnosis not present

## 2023-12-12 DIAGNOSIS — Z23 Encounter for immunization: Secondary | ICD-10-CM | POA: Diagnosis not present

## 2023-12-12 DIAGNOSIS — K219 Gastro-esophageal reflux disease without esophagitis: Secondary | ICD-10-CM | POA: Diagnosis not present

## 2023-12-12 DIAGNOSIS — I700 Atherosclerosis of aorta: Secondary | ICD-10-CM | POA: Diagnosis not present

## 2023-12-13 ENCOUNTER — Other Ambulatory Visit: Payer: Self-pay | Admitting: Neurology

## 2023-12-13 MED ORDER — METHYLPHENIDATE HCL 10 MG PO TABS: 10.0000 mg | ORAL_TABLET | Freq: Two times a day (BID) | ORAL | 0 refills | Status: DC

## 2023-12-13 NOTE — Telephone Encounter (Signed)
Pt is needing a refill on her  methylphenidate (RITALIN) 10 MG tablet sent to the [facility] in [place]

## 2023-12-13 NOTE — Telephone Encounter (Signed)
Last seen on 06/27/23 Follow up scheduled on 01/10/24 Last filled on 11/08/23 #60 tablets (30 day supply) Rx pending to be signed

## 2023-12-30 DIAGNOSIS — R350 Frequency of micturition: Secondary | ICD-10-CM | POA: Diagnosis not present

## 2023-12-30 DIAGNOSIS — N3941 Urge incontinence: Secondary | ICD-10-CM | POA: Diagnosis not present

## 2024-01-06 DIAGNOSIS — K2100 Gastro-esophageal reflux disease with esophagitis, without bleeding: Secondary | ICD-10-CM | POA: Diagnosis not present

## 2024-01-06 DIAGNOSIS — R195 Other fecal abnormalities: Secondary | ICD-10-CM | POA: Diagnosis not present

## 2024-01-09 DIAGNOSIS — M810 Age-related osteoporosis without current pathological fracture: Secondary | ICD-10-CM | POA: Diagnosis not present

## 2024-01-09 DIAGNOSIS — Z780 Asymptomatic menopausal state: Secondary | ICD-10-CM | POA: Diagnosis not present

## 2024-01-10 ENCOUNTER — Ambulatory Visit: Payer: PPO | Admitting: Neurology

## 2024-01-16 DIAGNOSIS — N3941 Urge incontinence: Secondary | ICD-10-CM | POA: Diagnosis not present

## 2024-01-16 DIAGNOSIS — R339 Retention of urine, unspecified: Secondary | ICD-10-CM | POA: Diagnosis not present

## 2024-01-17 ENCOUNTER — Other Ambulatory Visit: Payer: Self-pay

## 2024-01-17 ENCOUNTER — Other Ambulatory Visit: Payer: Self-pay | Admitting: Neurology

## 2024-01-17 MED ORDER — METHYLPHENIDATE HCL 10 MG PO TABS: 10.0000 mg | ORAL_TABLET | Freq: Two times a day (BID) | ORAL | 0 refills | Status: DC

## 2024-01-17 NOTE — Telephone Encounter (Signed)
Pt Last Seen 06/27/2023 (Dr. Epimenio Foot) Upcoming Appointment 02/01/2024  Ritalin Last Filled 12/14/2023 Escript 01/17/2024

## 2024-01-17 NOTE — Telephone Encounter (Signed)
Pt request refill for methylphenidate (RITALIN) 10 MG tablet send  to [facility] [phone]

## 2024-01-25 ENCOUNTER — Other Ambulatory Visit: Payer: Self-pay | Admitting: Neurology

## 2024-01-30 ENCOUNTER — Telehealth: Payer: Self-pay | Admitting: Neurology

## 2024-01-30 NOTE — Telephone Encounter (Signed)
 MYC conf

## 2024-01-31 NOTE — Telephone Encounter (Signed)
Pt rescheduling due to have tested positive for Covid

## 2024-02-01 ENCOUNTER — Ambulatory Visit: Payer: PPO | Admitting: Neurology

## 2024-02-08 DIAGNOSIS — J45998 Other asthma: Secondary | ICD-10-CM | POA: Diagnosis not present

## 2024-02-08 DIAGNOSIS — G35 Multiple sclerosis: Secondary | ICD-10-CM | POA: Diagnosis not present

## 2024-02-08 DIAGNOSIS — R058 Other specified cough: Secondary | ICD-10-CM | POA: Diagnosis not present

## 2024-02-08 DIAGNOSIS — J209 Acute bronchitis, unspecified: Secondary | ICD-10-CM | POA: Diagnosis not present

## 2024-02-15 DIAGNOSIS — K5730 Diverticulosis of large intestine without perforation or abscess without bleeding: Secondary | ICD-10-CM | POA: Diagnosis not present

## 2024-02-15 DIAGNOSIS — K5289 Other specified noninfective gastroenteritis and colitis: Secondary | ICD-10-CM | POA: Diagnosis not present

## 2024-02-15 DIAGNOSIS — Z83719 Family history of colon polyps, unspecified: Secondary | ICD-10-CM | POA: Diagnosis not present

## 2024-02-15 DIAGNOSIS — R195 Other fecal abnormalities: Secondary | ICD-10-CM | POA: Diagnosis not present

## 2024-02-17 DIAGNOSIS — N3941 Urge incontinence: Secondary | ICD-10-CM | POA: Diagnosis not present

## 2024-02-17 DIAGNOSIS — K5289 Other specified noninfective gastroenteritis and colitis: Secondary | ICD-10-CM | POA: Diagnosis not present

## 2024-02-17 DIAGNOSIS — R350 Frequency of micturition: Secondary | ICD-10-CM | POA: Diagnosis not present

## 2024-03-05 ENCOUNTER — Other Ambulatory Visit: Payer: Self-pay | Admitting: Neurology

## 2024-03-05 ENCOUNTER — Other Ambulatory Visit: Payer: Self-pay

## 2024-03-05 DIAGNOSIS — Z1231 Encounter for screening mammogram for malignant neoplasm of breast: Secondary | ICD-10-CM | POA: Diagnosis not present

## 2024-03-05 MED ORDER — METHYLPHENIDATE HCL 10 MG PO TABS: 10.0000 mg | ORAL_TABLET | Freq: Two times a day (BID) | ORAL | 0 refills | Status: DC

## 2024-03-05 MED ORDER — DONEPEZIL HCL 10 MG PO TABS: 10.0000 mg | ORAL_TABLET | Freq: Every day | ORAL | 0 refills | Status: DC

## 2024-03-05 NOTE — Telephone Encounter (Signed)
 Pt called needing a refill on her methylphenidate (RITALIN) 10 MG tablet Please have it sent to the [facility] on [address]. Pt is completely out.

## 2024-03-05 NOTE — Telephone Encounter (Signed)
 Last seen 06/27/23, next appt 08/16/24 Dispenses   Dispensed Days Supply Quantity Provider Pharmacy  METHYLPHENIDATE 10MG  TABLETS 01/17/2024 30 60 each Sater, Pearletha Furl, MD [facility] #...  METHYLPHENIDATE 10MG  TABLETS 12/14/2023 30 60 each Sater, Pearletha Furl, MD [facility] #...  METHYLPHENIDATE 10MG  TABLETS 11/08/2023 30 60 each Sater, Pearletha Furl, MD [facility]ORE #...  METHYLPHENIDATE 10MG  TABLETS 10/04/2023 30 60 each Sater, Pearletha Furl, MD [facility] #...  METHYLPHENIDATE 10MG  TABLETS 08/30/2023 30 60 each Sater, Pearletha Furl, MD [facility] DR[facility] STORE #...  METHYLPHENIDATE 10MG  TABLETS 07/22/2023 30 60 each Sater, Pearletha Furl, MD [facility] DR[facility] STORE #...  METHYLPHENIDATE 10MG  TABLETS 06/22/2023 30 60 each Lomax, Amy, NP [facility] #...  METHYLPHENIDATE 10MG  TABLETS 05/18/2023 30 60 each Sater, Pearletha Furl, MD [facility] DR[facility] STORE #...  METHYLPHENIDATE 10MG  TABLETS 04/12/2023 30 60 each Lomax, Amy, NP [facility] #.Marland KitchenMarland Kitchen

## 2024-03-14 ENCOUNTER — Other Ambulatory Visit: Payer: Self-pay

## 2024-03-14 MED ORDER — BACLOFEN 10 MG PO TABS: ORAL_TABLET | ORAL | 5 refills | Status: DC

## 2024-03-21 ENCOUNTER — Other Ambulatory Visit: Payer: Self-pay | Admitting: *Deleted

## 2024-03-21 MED ORDER — ROPINIROLE HCL 0.25 MG PO TABS: ORAL_TABLET | ORAL | 1 refills | Status: DC

## 2024-03-21 NOTE — Telephone Encounter (Signed)
 Last seen on 06/27/23 Follow up scheduled on 08/16/24

## 2024-04-04 DIAGNOSIS — J4489 Other specified chronic obstructive pulmonary disease: Secondary | ICD-10-CM | POA: Diagnosis not present

## 2024-04-04 DIAGNOSIS — R053 Chronic cough: Secondary | ICD-10-CM | POA: Diagnosis not present

## 2024-04-04 DIAGNOSIS — J310 Chronic rhinitis: Secondary | ICD-10-CM | POA: Diagnosis not present

## 2024-04-09 ENCOUNTER — Other Ambulatory Visit: Payer: Self-pay | Admitting: Neurology

## 2024-04-09 MED ORDER — METHYLPHENIDATE HCL 10 MG PO TABS: 10.0000 mg | ORAL_TABLET | Freq: Two times a day (BID) | ORAL | 0 refills | Status: DC

## 2024-04-09 NOTE — Telephone Encounter (Signed)
 Pt called stating that she has been out of her methylphenidate (RITALIN) 10 MG tablet and is needing a refill request sent to the [facility] [address]

## 2024-04-10 ENCOUNTER — Other Ambulatory Visit: Payer: Self-pay | Admitting: Neurology

## 2024-04-23 DIAGNOSIS — M25561 Pain in right knee: Secondary | ICD-10-CM | POA: Diagnosis not present

## 2024-04-23 DIAGNOSIS — S46011D Strain of muscle(s) and tendon(s) of the rotator cuff of right shoulder, subsequent encounter: Secondary | ICD-10-CM | POA: Diagnosis not present

## 2024-05-08 ENCOUNTER — Other Ambulatory Visit: Payer: Self-pay | Admitting: Neurology

## 2024-05-08 MED ORDER — DONEPEZIL HCL 10 MG PO TABS: 10.0000 mg | ORAL_TABLET | Freq: Every day | ORAL | 0 refills | Status: DC

## 2024-05-08 NOTE — Telephone Encounter (Signed)
 Pt is requesting a refill for donepezil  (ARICEPT ) 10 MG tablet .  [phone]

## 2024-05-08 NOTE — Telephone Encounter (Signed)
 Last seen on 06/27/23 Follow up scheduled on 08/16/24  Was patient to continue Rx?

## 2024-05-10 ENCOUNTER — Other Ambulatory Visit: Payer: Self-pay | Admitting: Neurology

## 2024-05-10 MED ORDER — METHYLPHENIDATE HCL 10 MG PO TABS: 10.0000 mg | ORAL_TABLET | Freq: Two times a day (BID) | ORAL | 0 refills | Status: DC

## 2024-05-10 NOTE — Telephone Encounter (Signed)
 Pt called needing her  methylphenidate  (RITALIN ) 10 MG tablet refill [facility] [address]. Pt states that she is completely out.

## 2024-05-10 NOTE — Telephone Encounter (Signed)
 Last seen 06/27/23 and next f/u 08/16/24. Last refilled 04/09/24 #60.

## 2024-05-23 DIAGNOSIS — M25561 Pain in right knee: Secondary | ICD-10-CM | POA: Diagnosis not present

## 2024-05-28 DIAGNOSIS — L820 Inflamed seborrheic keratosis: Secondary | ICD-10-CM | POA: Diagnosis not present

## 2024-05-28 DIAGNOSIS — L821 Other seborrheic keratosis: Secondary | ICD-10-CM | POA: Diagnosis not present

## 2024-05-28 DIAGNOSIS — L814 Other melanin hyperpigmentation: Secondary | ICD-10-CM | POA: Diagnosis not present

## 2024-05-28 DIAGNOSIS — L218 Other seborrheic dermatitis: Secondary | ICD-10-CM | POA: Diagnosis not present

## 2024-05-28 DIAGNOSIS — L718 Other rosacea: Secondary | ICD-10-CM | POA: Diagnosis not present

## 2024-05-28 DIAGNOSIS — D1801 Hemangioma of skin and subcutaneous tissue: Secondary | ICD-10-CM | POA: Diagnosis not present

## 2024-05-28 DIAGNOSIS — Z85828 Personal history of other malignant neoplasm of skin: Secondary | ICD-10-CM | POA: Diagnosis not present

## 2024-06-01 DIAGNOSIS — M25511 Pain in right shoulder: Secondary | ICD-10-CM | POA: Diagnosis not present

## 2024-06-06 ENCOUNTER — Other Ambulatory Visit: Payer: Self-pay | Admitting: Neurology

## 2024-06-06 MED ORDER — METHYLPHENIDATE HCL 10 MG PO TABS: 10.0000 mg | ORAL_TABLET | Freq: Two times a day (BID) | ORAL | 0 refills | Status: DC

## 2024-06-06 NOTE — Telephone Encounter (Signed)
 Last seen on 06/27/23 Follow up scheduled on 08/16/24   Dispensed Days Supply Quantity Provider Pharmacy  METHYLPHENIDATE  10 MG TABLET 05/10/2024 30 60 each Sater, Sherida Dimmer, MD [phone] - [facility]...   Rx pending to be signed

## 2024-06-06 NOTE — Telephone Encounter (Signed)
 Pt is requesting a refill for methylphenidate  (RITALIN ) 10 MG tablet .  [facility] [phone]

## 2024-06-08 ENCOUNTER — Telehealth: Payer: Self-pay

## 2024-06-08 DIAGNOSIS — S46011D Strain of muscle(s) and tendon(s) of the rotator cuff of right shoulder, subsequent encounter: Secondary | ICD-10-CM | POA: Diagnosis not present

## 2024-06-08 NOTE — Telephone Encounter (Signed)
 Left message for pt to call our office to schedule IN OFFICE Preop appt.

## 2024-06-08 NOTE — Telephone Encounter (Signed)
   Pre-operative Risk Assessment    Patient Name: [name]  DOB: 07/04/1951 MRN: [id number]   Date of last office visit: 02/16/23 Knox Perl, MD Date of next office visit: NONE   Request for Surgical Clearance    Procedure:  RIGHT KNEE SCOPE  Date of Surgery:  Clearance TBD                                Surgeon:  Osa Blase, MD Surgeon's Group or Practice Name:  [facility] Phone number:  [phone]  EXT 3132 Fax number:  [phone]  ATTN: Mark Sil   Type of Clearance Requested:   - Medical    Type of Anesthesia:  CHOICE   Additional requests/questions:    Kita Perish   06/08/2024, 2:46 PM

## 2024-06-08 NOTE — Telephone Encounter (Signed)
 Patient returned call and scheduled for 06/19/24 at 1:55 PM with PA Dayna Dunn.

## 2024-06-17 NOTE — Progress Notes (Signed)
 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 , low grade appendiceal mucinous neoplasm    HNP (herniated nucleus pulposus), cervical 05/17/2019   Hypokalemia 12/04/2019   Mitral valve prolapse    states no problem   Multiple sclerosis 1989   Myocarditis 04/25/2019   Neurogenic bladder    Obstructive chronic bronchitis with exacerbation 09/10/2008   Qualifier: Diagnosis of  By: [name]    Occipital neuralgia 05/06/2015   Osteoporosis    Pneumonia    PONV (postoperative nausea and vomiting)    Pulmonary nodule, right upper lobe 01/22/2009   Short of breath on exertion    Sjogren's disease    dryness of eyes, mouth   Spastic gait    Transverse myelitis    T4   Urinary frequency 02/05/2015   Vitamin D  deficiency 12/09/2016   Vocal cord disorder 10/21/2009   Wears bilateral ankle braces    AFO braces for stability    History of Present Illness  [name] is a 73 y.o. female with a PMH of multiple sclerosis, asthma, MVP Sjogren's disease, syncope who presents today for preoperative clearance.  [name] was seen initially by Dr. Ladona in 2022 for palpitations and  dyspnea on exertion.  She was admitted on 03/2019 at [name] with possible STEMI with abnormal troponins and underwent LHC that showed normal coronaries with mid anterior apical hypokinesis.  2D echo was completed showing EF of 45% and repeat 2D echo showed normal EF.  She completed a repeat 2D echo by Dr. Ladona that showed normal EF of 65% with mild aortic valve calcification and mild aortic regurg with mild to moderate TR.  She completed coronary calcium  [name] on 09/10/2020 that showed [name] of 7 calcium  in the LAD.  She was last seen by Dr. Ladona on 02/16/2023 with complaint of syncope.  She reported 3 episodes over the past 3 to 4 months with all episodes occurring while standing.  She had carotid massage completed that did not reveal any significant cause and syncope deemed noncardiac and vasovagal in nature.  There was no further evaluation from a cardiac standpoint at that time.   Patient denies chest pain, palpitations, dyspnea, PND, orthopnea, nausea, vomiting, dizziness, syncope, edema, weight gain, or early satiety.   Discussed the use of AI scribe software for clinical note transcription with the patient, who gave verbal consent to proceed.  History of Present Illness    ***Notes: Preop clearance for right knee scope   Review of Systems  Please see the history of present  illness.    All other systems reviewed and are otherwise negative except as noted above.  Physical Exam    Wt Readings from Last 3 Encounters:  06/27/23 135 lb 9.6 oz (61.5 kg)  02/16/23 142 lb (64.4 kg)  02/03/23 137 lb 6.4 oz (62.3 kg)   CD:Uyzmz were no vitals filed for this visit.,There is no height or weight on file to calculate BMI. GEN: Well nourished, well developed in no acute distress Neck: No JVD; No carotid bruits Pulmonary: Clear to auscultation without rales, wheezing or rhonchi  Cardiovascular: Normal rate. Regular rhythm. Normal S1. Normal S2.   Murmurs: There is no murmur.  ABDOMEN:  Soft, non-tender, non-distended EXTREMITIES:  No edema; No deformity   EKG/LABS/ Recent Cardiac Studies   ECG personally reviewed by me today - ***  Risk Assessment/Calculations:   {Does this patient have ATRIAL FIBRILLATION?:[id number]}      Lab Results  Component Value Date   WBC 5.6 01/28/2023   HGB 14.5 01/28/2023   HCT 44.3 01/28/2023   MCV 93.7 01/28/2023   PLT 214 01/28/2023   Lab Results  Component Value Date   CREATININE 0.93 01/28/2023   BUN 11 01/28/2023   NA 140 01/28/2023   K 4.1 01/28/2023   CL 103 01/28/2023   CO2 30 01/28/2023   No results found for: CHOL, HDL, LDLCALC, LDLDIRECT, TRIG, CHOLHDL  No results found for: HGBA1C Assessment & Plan    Assessment and Plan Assessment & Plan     1.  Preoperative clearance  2.  History of nonrheumatic MR  3.  History of syncope  4.  History of MS      Disposition: Follow-up with None or APP in *** months {Are you ordering a CV Procedure (e.g. stress test, cath, DCCV, TEE, etc)?   Press F2        :[id number]}   Signed, Wyn Raddle, Jackee Shove, NP 06/17/2024, 1:55 PM [name]

## 2024-06-18 ENCOUNTER — Encounter: Payer: Self-pay | Admitting: Nurse Practitioner

## 2024-06-18 ENCOUNTER — Ambulatory Visit: Attending: Nurse Practitioner | Admitting: Nurse Practitioner

## 2024-06-18 VITALS — BP 108/60 | HR 90 | Ht 70.0 in | Wt 142.6 lb

## 2024-06-18 DIAGNOSIS — G35 Multiple sclerosis: Secondary | ICD-10-CM | POA: Diagnosis not present

## 2024-06-18 DIAGNOSIS — R55 Syncope and collapse: Secondary | ICD-10-CM | POA: Diagnosis not present

## 2024-06-18 DIAGNOSIS — R0600 Dyspnea, unspecified: Secondary | ICD-10-CM | POA: Diagnosis not present

## 2024-06-18 DIAGNOSIS — Z01810 Encounter for preprocedural cardiovascular examination: Secondary | ICD-10-CM

## 2024-06-18 DIAGNOSIS — I340 Nonrheumatic mitral (valve) insufficiency: Secondary | ICD-10-CM

## 2024-06-18 NOTE — Patient Instructions (Addendum)
 Medication Instructions:  Your physician recommends that you continue on your current medications as directed. Please refer to the Current Medication list given to you today. *If you need a refill on your cardiac medications before your next appointment, please call your pharmacy*  Lab Work: NONE ORDERED If you have labs (blood work) drawn today and your tests are completely normal, you will receive your results only by: MyChart Message (if you have MyChart) OR A paper copy in the mail If you have any lab test that is abnormal or we need to change your treatment, we will call you to review the results.  Testing/Procedures: NONE ORDERED  Follow-Up: At [facility], you and your health needs are our priority.  As part of our continuing mission to provide you with exceptional heart care, our providers are all part of one team.  This team includes your primary Cardiologist (physician) and Advanced Practice Providers or APPs (Physician Assistants and Nurse Practitioners) who all work together to provide you with the care you need, when you need it.  Your next appointment:   12 month(s)  Provider:   Gordy Schwalbe, MD  We recommend signing up for the patient portal called MyChart.  Sign up information is provided on this After Visit Summary.  MyChart is used to connect with patients for Virtual Visits (Telemedicine).  Patients are able to view lab/test results, encounter notes, upcoming appointments, etc.  Non-urgent messages can be sent to your provider as well.   To learn more about what you can do with MyChart, go to [email or web address].   Other Instructions

## 2024-06-19 ENCOUNTER — Ambulatory Visit: Admitting: Physician Assistant

## 2024-06-28 DIAGNOSIS — M948X6 Other specified disorders of cartilage, lower leg: Secondary | ICD-10-CM | POA: Diagnosis not present

## 2024-06-28 DIAGNOSIS — S83271A Complex tear of lateral meniscus, current injury, right knee, initial encounter: Secondary | ICD-10-CM | POA: Diagnosis not present

## 2024-06-28 DIAGNOSIS — S83282A Other tear of lateral meniscus, current injury, left knee, initial encounter: Secondary | ICD-10-CM | POA: Diagnosis not present

## 2024-07-03 DIAGNOSIS — J029 Acute pharyngitis, unspecified: Secondary | ICD-10-CM | POA: Diagnosis not present

## 2024-07-03 DIAGNOSIS — J449 Chronic obstructive pulmonary disease, unspecified: Secondary | ICD-10-CM | POA: Diagnosis not present

## 2024-07-03 DIAGNOSIS — J209 Acute bronchitis, unspecified: Secondary | ICD-10-CM | POA: Diagnosis not present

## 2024-07-03 DIAGNOSIS — J45998 Other asthma: Secondary | ICD-10-CM | POA: Diagnosis not present

## 2024-07-03 DIAGNOSIS — R058 Other specified cough: Secondary | ICD-10-CM | POA: Diagnosis not present

## 2024-07-03 DIAGNOSIS — G35 Multiple sclerosis: Secondary | ICD-10-CM | POA: Diagnosis not present

## 2024-07-03 DIAGNOSIS — Z1152 Encounter for screening for COVID-19: Secondary | ICD-10-CM | POA: Diagnosis not present

## 2024-07-03 DIAGNOSIS — R519 Headache, unspecified: Secondary | ICD-10-CM | POA: Diagnosis not present

## 2024-07-16 DIAGNOSIS — G35 Multiple sclerosis: Secondary | ICD-10-CM | POA: Diagnosis not present

## 2024-07-16 DIAGNOSIS — E785 Hyperlipidemia, unspecified: Secondary | ICD-10-CM | POA: Diagnosis not present

## 2024-07-16 DIAGNOSIS — M810 Age-related osteoporosis without current pathological fracture: Secondary | ICD-10-CM | POA: Diagnosis not present

## 2024-07-16 DIAGNOSIS — M3500 Sicca syndrome, unspecified: Secondary | ICD-10-CM | POA: Diagnosis not present

## 2024-07-16 DIAGNOSIS — G43909 Migraine, unspecified, not intractable, without status migrainosus: Secondary | ICD-10-CM | POA: Diagnosis not present

## 2024-07-16 DIAGNOSIS — I129 Hypertensive chronic kidney disease with stage 1 through stage 4 chronic kidney disease, or unspecified chronic kidney disease: Secondary | ICD-10-CM | POA: Diagnosis not present

## 2024-07-16 DIAGNOSIS — J45998 Other asthma: Secondary | ICD-10-CM | POA: Diagnosis not present

## 2024-07-16 DIAGNOSIS — J209 Acute bronchitis, unspecified: Secondary | ICD-10-CM | POA: Diagnosis not present

## 2024-07-16 DIAGNOSIS — G609 Hereditary and idiopathic neuropathy, unspecified: Secondary | ICD-10-CM | POA: Diagnosis not present

## 2024-07-16 DIAGNOSIS — J449 Chronic obstructive pulmonary disease, unspecified: Secondary | ICD-10-CM | POA: Diagnosis not present

## 2024-07-16 DIAGNOSIS — N1831 Chronic kidney disease, stage 3a: Secondary | ICD-10-CM | POA: Diagnosis not present

## 2024-07-16 DIAGNOSIS — G373 Acute transverse myelitis in demyelinating disease of central nervous system: Secondary | ICD-10-CM | POA: Diagnosis not present

## 2024-07-16 DIAGNOSIS — I2510 Atherosclerotic heart disease of native coronary artery without angina pectoris: Secondary | ICD-10-CM | POA: Diagnosis not present

## 2024-07-17 ENCOUNTER — Other Ambulatory Visit: Payer: Self-pay | Admitting: Neurology

## 2024-07-17 MED ORDER — METHYLPHENIDATE HCL 10 MG PO TABS: 10.0000 mg | ORAL_TABLET | Freq: Two times a day (BID) | ORAL | 0 refills | Status: DC

## 2024-07-17 NOTE — Telephone Encounter (Signed)
 Pt called needing a refill on her methylphenidate  (RITALIN ) 10 MG tablet and having it sent to the [facility] on [address].

## 2024-07-17 NOTE — Telephone Encounter (Signed)
 Last seen 06/27/23. Next follow up 08/16/24. Last refilled methylphenidate  06/08/24 #60.

## 2024-07-18 ENCOUNTER — Telehealth (HOSPITAL_COMMUNITY): Payer: Self-pay

## 2024-07-18 NOTE — Telephone Encounter (Signed)
 Auth Submission: NO AUTH NEEDED Site of care: [facility] Payer: [employer] Medication & CPT/J Code(s) submitted: Prolia (Denosumab) [id number] Diagnosis Code: M81.0 Route of submission (phone, fax, portal):  Phone # Fax # Auth type: Buy/Bill HB Units/visits requested: 60mg  x 2 doses Reference number: [id number] (Viek C) Approval from: 07/18/24 to 07/18/25

## 2024-07-26 ENCOUNTER — Other Ambulatory Visit (HOSPITAL_COMMUNITY): Payer: Self-pay | Admitting: *Deleted

## 2024-07-27 ENCOUNTER — Ambulatory Visit (HOSPITAL_COMMUNITY)
Admission: RE | Admit: 2024-07-27 | Discharge: 2024-07-27 | Disposition: A | Discharge status: Home / Self Care | Source: Ambulatory Visit | Attending: Internal Medicine | Admitting: Internal Medicine

## 2024-07-27 DIAGNOSIS — M810 Age-related osteoporosis without current pathological fracture: Secondary | ICD-10-CM | POA: Insufficient documentation

## 2024-07-27 MED ORDER — DENOSUMAB 60 MG/ML ~~LOC~~ SOSY
PREFILLED_SYRINGE | SUBCUTANEOUS | Status: AC
  Filled 2024-07-27: qty 1

## 2024-07-27 MED ORDER — DENOSUMAB 60 MG/ML ~~LOC~~ SOSY
60.0000 mg | PREFILLED_SYRINGE | Freq: Once | SUBCUTANEOUS | Status: AC
  Administered 2024-07-27: 60 mg via SUBCUTANEOUS

## 2024-08-08 ENCOUNTER — Telehealth: Payer: Self-pay | Admitting: *Deleted

## 2024-08-08 ENCOUNTER — Telehealth: Payer: Self-pay

## 2024-08-08 DIAGNOSIS — S46011D Strain of muscle(s) and tendon(s) of the rotator cuff of right shoulder, subsequent encounter: Secondary | ICD-10-CM | POA: Diagnosis not present

## 2024-08-08 NOTE — Telephone Encounter (Signed)
   Patient Name: [name]  DOB: 1951/11/24 MRN: [id number]  Primary Cardiologist: Vonda  Chart reviewed as part of pre-operative protocol coverage. Given past medical history and time since last visit, based on ACC/AHA guidelines, [name] is at acceptable risk for the planned procedure without further cardiovascular testing.   Preoperative cardiac evaluation Revised cardiac index score of 0.4% indicates less than 1% risk of major cardiac event. Functional status confirmed for surgery. - Proceed with surgery as planned.  The patient was advised that if she develops new symptoms prior to surgery to contact our office to arrange for a follow-up visit, and she verbalized understanding.  I will route this recommendation to the requesting party via Epic fax function and remove from pre-op pool.  Please call with questions.  Lamarr Satterfield, NP 08/08/2024, 11:54 AM

## 2024-08-08 NOTE — Telephone Encounter (Signed)
   Pre-operative Risk Assessment    Patient Name: [name]  DOB: 1951/12/02 MRN: [id number]   Date of last office visit: 06/18/24 JACKEE WYN RADDLE, NP Date of next office visit: NONE   Request for Surgical Clearance    Procedure:  RIGHT REVERSE TOTAL SHOULDER ARTHROPLASTY  Date of Surgery:  Clearance 10/25/24                                Surgeon:  FONDA OLMSTED, MD Surgeon's Group or Practice Name:  [facility] Phone number:  [phone]  EXT [phone] Fax number:  [phone]  ATTN: MAEOLA DIVERS   Type of Clearance Requested:   - Medical    Type of Anesthesia:  General WITH INTERSCALENE BLOCK   Additional requests/questions:    SignedLucie DELENA Ku   08/08/2024, 11:42 AM

## 2024-08-08 NOTE — Telephone Encounter (Signed)
 Called pt at [phone]. Relayed we received surgical clearance form. Offered 08/10/51 at 10a with Dr. Vear. She accepted. Cx 08/16/24 appt since she is coming in sooner.

## 2024-08-09 ENCOUNTER — Encounter: Payer: Self-pay | Admitting: Neurology

## 2024-08-09 ENCOUNTER — Telehealth: Payer: Self-pay | Admitting: *Deleted

## 2024-08-09 ENCOUNTER — Ambulatory Visit (INDEPENDENT_AMBULATORY_CARE_PROVIDER_SITE_OTHER): Admitting: Neurology

## 2024-08-09 VITALS — BP 119/63 | HR 84 | Ht 70.0 in | Wt 139.0 lb

## 2024-08-09 DIAGNOSIS — R32 Unspecified urinary incontinence: Secondary | ICD-10-CM

## 2024-08-09 DIAGNOSIS — R5383 Other fatigue: Secondary | ICD-10-CM

## 2024-08-09 DIAGNOSIS — R41840 Attention and concentration deficit: Secondary | ICD-10-CM | POA: Diagnosis not present

## 2024-08-09 DIAGNOSIS — G373 Acute transverse myelitis in demyelinating disease of central nervous system: Secondary | ICD-10-CM

## 2024-08-09 MED ORDER — METHYLPHENIDATE HCL 10 MG PO TABS: 10.0000 mg | ORAL_TABLET | Freq: Two times a day (BID) | ORAL | 0 refills | Status: DC

## 2024-08-09 MED ORDER — ROPINIROLE HCL 0.25 MG PO TABS: ORAL_TABLET | ORAL | 3 refills | Status: AC

## 2024-08-09 NOTE — Progress Notes (Signed)
 [facility]  PATIENT: [name] DOB: [birth year]  REFERRING CLINICIAN: Oneil Neth HISTORY FROM: patient   REASON FOR VISIT: transverse myelitis, MS?   HISTORICAL  CHIEF COMPLAINT:  Chief Complaint  Patient presents with   Follow-up    Pt in room 11. Alone.Here for MS follow up and surgical clearance for Oct.  Pt report doing well from MS standpoint. Pt states balance if not so good. Otherwise okay.  Having right shoulder replacement.     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     Update 08/09/2024:  She reports generally doing well with no exacerbation or significant new neurologic symptoms.  Currently, she is at baseline.  She continues to have symptoms from the transverse myelitis in the past.  Her gait is mildly off balanced but she does not need to use a cane   she uses the bannister on stairs.  She has cramps in her left foot > right.  These are worse at night.   When more severe they go up to the calf.    She feels strength is usually baseline but muscles fatigue easily.      She has urinary hesitancy and  urinary incontinence.   In the past, Ditropan had not help.    She has a stimulator x 5 years and is seeing Dr. Gaston.  With a fall it apparently became dislodged and no longer functional  Vision is stable.  Neck pain improved after surgery  (had pseudoarthrosis after prior ACDF and had posterior fusion 01/2023).   She stopped the lamotrigine  and noted no major difference.    Cognitively, she is better with the Ritalin . Memory is better than a couple years ago.   Driving well.    She does better when she gets a good night sleep.        Over the years she has had some difficulty with memory, processing and word finding.   She is driving well.     Her husband handles finances.    She also feels mood is doing well.       Other:  In April 2021 she had an MI but catheterization was fine.   She  was felt to have stress cardiomyopathy (less likely focal viral myocarditis).   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    EF = 45%.  She notes SOB with exertion.    She has had SOB, with exertion.   She recently saw cardiology  She will be having a reverse shoulder replacement on the right       12/14/2022   11:20 AM 12/08/2021    1:08 PM 06/08/2021   11:47 AM 12/08/2020   11:16 AM 09/17/2019    4:57 PM  Montreal Cognitive Assessment   Visuospatial/ Executive (0/5) 3 4 4 2 2   Naming (0/3) 3 3 3 3 3   Attention: Read list of digits (0/2) 2 2 2 1  0  Attention: Read list of letters (0/1) 1 1 1 1 1   Attention: Serial 7 subtraction starting at 100 (0/3) 1 1 1 1 1   Language: Repeat phrase (0/2) 1 2 1  0 0  Language : Fluency (0/1) 1 1 1  0 0  Abstraction (0/2) 2 2 2 2  0  Delayed Recall (0/5) 3 3 5 4 3   Orientation (0/6) 6 6 6 5 6   Total 23 25 26 19 16   Adjusted Score (based on education) 23 25  26 19 16      TM/MS History:  In 1989, she had severe numbness in one side of her body.  She had clumsiness and poor gait.  MRI was reportedly normal.     She saw Dr. Maurice who did an LP that showed oligoclonal bands.   She received IV steroids and was told she had MS.   In 1993, she woke up numb from the waist down in both legs.  This numbness was more intense than the prior episodes and gait was very poor.    She was admitted x 28 days, receiving many days of IV steroids followed by Rehab.     An MRI of the brain was normal but the MRI of the thoracic spine showed a plaque at T4.     She was started on Betaseron and did well in general.  She had some fluctuating symptom but nothing resembling any of the other 3 episodes.   In November 2014 she had additional imaging studies showing a normal MRI of the brain, an abnormal MRI of the thoracic spine with a focus at T4, and a normal cervical spinal cord. She did have evidence of prior fusion surgery in the neck.  MRI of the cervical spine in September 2016 showed  stable postoperative ACDF at C4-C5. The spinal cord appeared normal.   There was no myelopathy in the cervical spine. She also underwent a lumbar puncture in November 2014. It was normal and did not show oligoclonal bands or increased IgG index.    She has been treated with Betaseron, Avonex, Tysabri and Gilenya for presumptive MS in the past.   She stopped Tysabri as was JCV Ab positive and stopped Gilenya as she had macula edema.   She tried Aubagio  but stopped due to insurance.  No DMT since 2014.    We have discussed that she appears to have isolated transverse myelitis rather than MS.  Imaging: MRI of the brain 09/15/2015 showed normal brain for age.  MRI of the cervical spine 09/25/2015 showed ACDF.  The spinal cord was normal.  My of the thoracic spine 02/19/2015 showed a single focus adjacent to T4 consistent with a chronic demyelinating plaque or chronic myelitis.  No change compared to 2014.  CT myelogram cervical spine 04/20/2019 showed a left central disc protrusion at C6-C7 effacing the ventral CSF.  There is no foraminal narrowing.  There is moderate foraminal narrowing bilaterally at C5-C6 but no spinal stenosis.  X-ray 05/17/2019 showed prior fusion at C3-C5 and fusion at C5-C7 associated with anterior fixation hardware.  MRI cervical spine 06/18/2021 shows stable ACDF.  She is fused C3-C7.  No spinal stenosis or nerve root  compression.      Other: EEG 07/28/2022 was normal  REVIEW OF SYSTEMS:  Constitutional: No fevers, chills, sweats, or change in .  She reports fatigue Eyes: as above Ear, nose and throat: No hearing loss, ear pain, nasal congestion, sore throat Cardiovascular: No chest pain, palpitations Respiratory:  No shortness of breath at rest or with exertion.   No wheezes GastrointestinaI: No nausea, vomiting, diarrhea, abdominal pain.  Reports fecal incontinence.   Reports dysphagia Genitourinary:  see above Musculoskeletal:  No neck pain, back pain.   Hasmuscle  cramps Integumentary: No rash, pruritus, skin lesions Neurological: as above Psychiatric: No depression at this time.  No anxiety Endocrine: No palpitations, diaphoresis, change in appetite, change in weigh or increased thirst Hematologic/Lymphatic:  No anemia, purpura, petechiae. Allergic/Immunologic: No itchy/runny eyes, nasal congestion,  recent allergic reactions, rashes  ALLERGIES: Allergies  Allergen Reactions   Other Shortness Of Breath    PURPLE LETTUCE   Betaseron [Interferon Beta-1b] Other (See Comments)    HARD NODULAR AREAS AT INJECTION SITE   Codeine Nausea And Vomiting   Gilenya [Fingolimod Hydrochloride] Other (See Comments)    MACULAR EDEMA   Morphine And Codeine Other (See Comments)    IV ROUTE - RED STREAKS OF VEIN   Sumatriptan Nausea Only    INJECTABLE ONLY - RAPID HEART RATE, FLUSHING   Tysabri [Natalizumab] Other (See Comments)    DEVELOPED JCV ANTIBODY   Zinc  Nicotinate [Zinc ]    Latex Rash    HOME MEDICATIONS: Outpatient Medications Prior to Visit  Medication Sig Dispense Refill   acetaminophen  (TYLENOL ) 500 MG tablet Take 1,000 mg by mouth every 8 (eight) hours as needed for moderate pain (pain score 4-6).     albuterol  (VENTOLIN  HFA) 108 (90 Base) MCG/ACT inhaler Inhale 2 puffs into the lungs every 6 (six) hours as needed for wheezing or shortness of breath.     amantadine  (SYMMETREL ) 100 MG capsule Take 100 mg by mouth at bedtime.     amphetamine -dextroamphetamine  (ADDERALL) 15 MG tablet Take 15 mg by mouth 2 (two) times daily.     baclofen  (LIORESAL ) 10 MG tablet TAKE 1/2 TO 1 TABLET(5 TO 10 MG) BY MOUTH THREE TIMES DAILY AS NEEDED 90 tablet 5   Biotin  5000 MCG CAPS Take 5,000 mcg by mouth daily.     caffeine  200 MG TABS tablet Take 200 mg by mouth daily.     calcium  carbonate (OS-CAL) 600 MG TABS tablet Take 1,200 mg by mouth.      cyclobenzaprine  (FLEXERIL ) 5 MG tablet Take 1 tablet (5 mg total) by mouth 3 (three) times daily as needed for muscle  spasms. 30 tablet 0   denosumab  (PROLIA ) 60 MG/ML SOLN injection Inject 60 mg into the skin every 6 (six) months. Administer in upper arm, thigh, or abdomen     Dexlansoprazole 30 MG capsule DR Take 30 mg by mouth daily.     docusate sodium  (COLACE) 100 MG capsule Take 1 capsule (100 mg total) by mouth 2 (two) times daily. 30 capsule 0   donepezil  (ARICEPT ) 10 MG tablet Take 1 tablet (10 mg total) by mouth at bedtime. 90 tablet 0   estradiol (ESTRACE) 0.1 MG/GM vaginal cream Place 1 Applicatorful vaginally 3 (three) times a week.     fluticasone (FLONASE) 50 MCG/ACT nasal spray Place 2 sprays into both nostrils as needed.     furosemide  (LASIX ) 20 MG tablet Take 20 mg by mouth as needed for fluid or edema.     loratadine  (CLARITIN ) 10 MG tablet Take 10 mg by mouth daily.     meloxicam (MOBIC) 15 MG tablet Take 15 mg by mouth as needed for pain.     methylphenidate  (RITALIN ) 10 MG tablet Take 1 tablet (10 mg total) by mouth 2 (two) times daily. 60 tablet 0   Multiple Vitamins-Minerals (MULTIVITAMIN PO) Take 1 tablet by mouth daily. Brand name - Mature Multivitamin from [employer]     potassium chloride  (KLOR-CON ) 20 MEQ packet Take 20 mEq by mouth as needed (with the furosemide ).     Probiotic Product (PHILLIPS COLON HEALTH PO) Take 1 tablet by mouth every morning.     rOPINIRole  (REQUIP ) 0.25 MG tablet TAKE 1 TO 2 TABLETS BY MOUTH AT NIGHT 180 tablet 1   rosuvastatin (CRESTOR) 20 MG tablet Take  20 mg by mouth at bedtime.     vitamin B-12 (CYANOCOBALAMIN ) 1000 MCG tablet Take 1,000 mcg by mouth daily.     VITAMIN D  PO Take 2,000 Units by mouth daily.     ZETIA  10 MG tablet Take 10 mg by mouth daily.     lamoTRIgine  (LAMICTAL ) 200 MG tablet Take 1 tablet (200 mg total) by mouth 2 (two) times daily. 180 tablet 3   oxyCODONE -acetaminophen  (PERCOCET/ROXICET) 5-325 MG tablet Take 1-2 tablets by mouth every 4 (four) hours as needed for moderate pain. (Patient not taking: Reported on 08/09/2024) 30 tablet  0   No facility-administered medications prior to visit.    PAST MEDICAL HISTORY: Past Medical History:  Diagnosis Date   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   Arthritis    back   Asthma    20 yrs., ago one episode   Cancer [facility])    Appendicial   Chronic migraine headaches 03/13/2008   Chronic rhinitis 03/13/2008   Deviated septum 01/23/2020   Dysesthesia    bilateral feet   Dysphagia, oropharyngeal phase 10/21/2009   Dyspnea    Gastroesophageal reflux disease 09/10/2008   Heart murmur    first noticed 41 yrs. 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 , low grade appendiceal mucinous neoplasm    HNP (herniated nucleus pulposus), cervical 05/17/2019   Hypokalemia 12/04/2019   Mitral valve prolapse    states no problem   Multiple sclerosis 1989   Myocarditis 04/25/2019   Neurogenic bladder    Obstructive chronic bronchitis with exacerbation 09/10/2008   Qualifier: Diagnosis of  By: Brien MD, Belvie BRAVO    Occipital neuralgia 05/06/2015   Osteoporosis    Pneumonia    PONV (postoperative nausea and vomiting)    Pulmonary nodule, right upper lobe 01/22/2009   Short of breath on exertion    Sjogren's disease    dryness of eyes, mouth   Spastic gait    Transverse myelitis    T4   Urinary frequency 02/05/2015   Vitamin D  deficiency 12/09/2016   Vocal cord disorder 10/21/2009   Wears bilateral ankle braces    AFO braces for stability    PAST SURGICAL HISTORY: Past Surgical History:  Procedure Laterality Date   ANAL RECTAL MANOMETRY N/A 08/18/2016   Procedure: ANO RECTAL MANOMETRY;  Surgeon: Bernarda Ned, MD;  Location: [facility];  Service: Endoscopy;  Laterality: N/A;   ANTERIOR CERVICAL DECOMP/DISCECTOMY FUSION  2004;   2003   C4 -- C5 (2004)/   C3 -- C4 (2003)   ANTERIOR CERVICAL DECOMP/DISCECTOMY FUSION N/A 05/17/2019    Procedure: EVACUATION HEMATOMA OF POST OP ANTERIOR CERVICAL DECOMPRESSION FUSION;  Surgeon: Alix Charleston, MD;  Location: [facility] OR;  Service: Neurosurgery;  Laterality: N/A;   ANTERIOR CERVICAL DECOMP/DISCECTOMY FUSION N/A 05/17/2019   Procedure: REMOVAL OF TETHER CERVICAL PLATE, ANTERIOR CERVICAL DECOMPRESSION/DISCECTOMY FUSION CERVICAL FIVE- CERVICAL SIX, CERVICAL SIX- CERVICAL SEVEN;  Surgeon: Alix Charleston, MD;  Location: [facility] OR;  Service: Neurosurgery;  Laterality: N/A;   APPENDECTOMY  06/22/2011   laparoscopic   BILATERAL SALPINGOOPHORECTOMY  1982   BLADDER SUSPENSION  1994   sling   BREAST ENHANCEMENT SURGERY Bilateral    BREAST IMPLANT REMOVAL Bilateral 12/03/2008   BUNIONECTOMY     CATARACT EXTRACTION W/ INTRAOCULAR LENS  IMPLANT, BILATERAL  2015   ELBOW SURGERY Right    HEMORROIDECTOMY  08/05/2010;  2013   INSERTION SACRAL NERVE STIMULATOR TEST WIRE  09-28-2016   dr debby   INTERCOSTAL NERVE BLOCK  2015   occipital   KNEE SURGERY Right    MENISCUS REPAIR Left 09/26/2018   POSTERIOR CERVICAL FUSION/FORAMINOTOMY N/A 02/03/2023   Procedure: POSTERIOR CERVICAL FUSION WITH LATERAL MASS FIXATION CERVICAL SIX-CERVICAL SEVEN; EXPLORE CERVICAL FIVE-CERVICAL SIX WITH POSSIBLE  FUSION;  Surgeon: Mavis Purchase, MD;  Location: [facility] OR;  Service: Neurosurgery;  Laterality: N/A;   RECTAL ULTRASOUND N/A 08/18/2016   Procedure: RECTAL ULTRASOUND;  Surgeon: Bernarda debby, MD;  Location: [facility];  Service: Endoscopy;  Laterality: N/A;   SHOULDER ARTHROSCOPY Left    SHOULDER ARTHROSCOPY WITH DISTAL CLAVICLE RESECTION Right 09/21/2007   w/  Acrominoplasty,  Debridement Rotator Cuff,  CA ligament release   TONSILLECTOMY  age 32   TRIGGER FINGER RELEASE Left 01/16/2015   Procedure: LEFT THUMB TRIGGER RELEASE ;  Surgeon: Franky Curia, MD;  Location: [facility];  Service: Orthopedics;  Laterality: Left;   VAGINAL HYSTERECTOMY  1981    FAMILY HISTORY: Family History  Problem  Relation Age of Onset   Stroke Mother    Cancer Mother        oropharyngeal   Heart attack Mother    Anesthesia problems Mother        post-op nausea   Liver disease Father    Cirrhosis Father    Cancer Sister 63       breast ca, lung ca   Cancer Brother        twin; tonsillar?   Cancer Paternal Grandmother        cervical   Cancer Other        Nephew; appendiceal carcnoid, melanoma    SOCIAL HISTORY:  Social History   Socioeconomic History   Marital status: Married    Spouse name: Tom   Number of children: 2   Years of education: 13   Highest education level: Some college, no degree  Occupational History   Occupation: Retired  Tobacco Use   Smoking status: Never    Passive exposure: Yes   Smokeless tobacco: Never   Tobacco comments:    parents, husband, & multiple family members  Vaping Use   Vaping status: Never Used  Substance and Sexual Activity   Alcohol  use: No    Alcohol /week: 0.0 standard drinks of alcohol    Drug use: No   Sexual activity: Not on file  Other Topics Concern   Not on file  Social History Narrative   Pt lives at home with her spouse of 37 years.   Caffeine  Use- Drinks caffeine  occasionally.      [facility]:   From [place]. Previously did administrative work. She currently has a cat & dog. No bird exposure. No mold or hot tub exposure. No carpet or draperies.       Social Drivers of Corporate investment banker Strain: Not on file  Food Insecurity: Low Risk  (04/04/2024)   Received from [facility]   Hunger Vital Sign    Within the past 12 months, you worried that your food would run out before you got money to buy more: Never true    Within the past 12 months, the food you bought just didn't last and you didn't have money to get more. : Never true  Transportation Needs: No Transportation Needs (04/04/2024)   Received from [facility]  Transportation    In the past 12 months, has lack of reliable transportation kept you from  medical appointments, meetings, work or from getting things needed for daily living? : No  Physical Activity: Not on file  Stress: Not on file  Social Connections: Not on file  Intimate Partner Violence: Not on file     PHYSICAL EXAM  Vitals:   08/09/24 0957  BP: 119/63  Pulse: 84  Weight: 139 lb (63 kg)  Height: 5' 10 (1.778 m)     Body mass index is 19.94 kg/m.   General: The patient is well-developed and well-nourished and in no acute distress  Neck:    She has tenderness in the occiput, right greater than left.SABRA ROM is reduced in the neck..    Neurologic Exam  Mental status: The patient is alert and oriented at the time of the examination.  She scored 25 /30 on the [facility] cognitive assessment (details are above).  ..  Speech was normal..    Cranial nerves: Extraocular movements are full.  Facial strength is normal.. No dysarthria is noted.    Motor:  Very mild essential tremor noted with writing.   Muscle bulk and tone are normal.  Strength was5/5 in arms  Strength was 5/5 in the left arm.  Rapid altering movements were normal I hands   Strength in her legs was 4+/5 p   Sensory: She has reduced sensation to touch below the mid thoracic level.  Mild reduced left arm sensation.  She has reduced vibration sensation in both legs symmetrically worse on left.   Coordination: Cerebellar testing shows good finger-nose-finger but reduced heel-to-shin bilaterally..  Gait and station: Station is stable eyes open.  She is walking without a cane.  There is slight foot drops.  Her turn is slightly unstable.   Romberg is positive.  Reflexes: Deep tendon reflexes are symmetric and normal in the arms but increased at the legs with crossed abductors at the knees.  No ankle clonus       ASSESSMENT AND PLAN  Transverse myelitis (HCC)  Urinary incontinence, unspecified type  Attention or concentration deficit  Other fatigue   1.   She has transverse myelitis (T4) with  fairly stable exam..  She has not developed other lesions so MS is unlikely.  2.   Continue methylphenidate  prn for ADD.   Continue ropinirole  for restless legs   Add baclofen  prn for spasms 3.   Gait is better an she no longer needs her cane 4.    If memory worsens consider ATN testing RTC 6 months, sooner if problems or based on the results.  This visit is part of a comprehensive longitudinal care medical relationship regarding the patients primary diagnosis of transverse myelitis and related concerns.   Nikcole Eischeid A. Vear, MD, PhD 08/09/2024, 10:28 AM Certified in Neurology, Clinical Neurophysiology, Sleep Medicine, Pain Medicine and Neuroimaging  [facility] [address] [place], [place] [address] [phone]

## 2024-08-09 NOTE — Telephone Encounter (Signed)
 Clearance form faxed to [phone] confirmation received.

## 2024-08-16 ENCOUNTER — Ambulatory Visit: Payer: PPO | Admitting: Neurology

## 2024-09-03 DIAGNOSIS — H524 Presbyopia: Secondary | ICD-10-CM | POA: Diagnosis not present

## 2024-09-03 DIAGNOSIS — H04123 Dry eye syndrome of bilateral lacrimal glands: Secondary | ICD-10-CM | POA: Diagnosis not present

## 2024-09-03 DIAGNOSIS — H26491 Other secondary cataract, right eye: Secondary | ICD-10-CM | POA: Diagnosis not present

## 2024-09-03 DIAGNOSIS — H348312 Tributary (branch) retinal vein occlusion, right eye, stable: Secondary | ICD-10-CM | POA: Diagnosis not present

## 2024-09-03 DIAGNOSIS — Z961 Presence of intraocular lens: Secondary | ICD-10-CM | POA: Diagnosis not present

## 2024-09-07 ENCOUNTER — Other Ambulatory Visit: Payer: Self-pay | Admitting: Neurology

## 2024-09-10 NOTE — Telephone Encounter (Signed)
 Last seen 08/09/24 Follow up scheduled on 02/25/25   Was patient to continued taking donepezil  10 mg tablet?

## 2024-09-26 ENCOUNTER — Other Ambulatory Visit: Payer: Self-pay | Admitting: Neurology

## 2024-09-26 MED ORDER — METHYLPHENIDATE HCL 10 MG PO TABS: 10.0000 mg | ORAL_TABLET | Freq: Two times a day (BID) | ORAL | 0 refills | Status: DC

## 2024-09-26 NOTE — Telephone Encounter (Signed)
 Dr.Penumalli you are work in provider this afternoon Last seen on 08/09/24 Follow up scheduled on 02/25/25   Dispensed Days Supply Quantity Provider Pharmacy  METHYLPHENIDATE  10 MG TABLET 08/23/2024 30 60 each Sater, Charlie LABOR, MD [facility] [phone] - G...     Rx pending to be signed

## 2024-09-26 NOTE — Telephone Encounter (Signed)
 Patient request refill for methylphenidate  (RITALIN ) 10 MG tablet send to  [facility] [phone]

## 2024-10-06 ENCOUNTER — Other Ambulatory Visit: Payer: Self-pay | Admitting: Neurology

## 2024-10-08 DIAGNOSIS — M75121 Complete rotator cuff tear or rupture of right shoulder, not specified as traumatic: Secondary | ICD-10-CM | POA: Diagnosis not present

## 2024-10-08 DIAGNOSIS — M25511 Pain in right shoulder: Secondary | ICD-10-CM | POA: Diagnosis not present

## 2024-10-08 NOTE — Telephone Encounter (Signed)
 Last seen on 08/09/24 per note  Add baclofen  prn for spasms  Follow up scheduled on 02/25/25

## 2024-10-25 DIAGNOSIS — M75121 Complete rotator cuff tear or rupture of right shoulder, not specified as traumatic: Secondary | ICD-10-CM | POA: Diagnosis not present

## 2024-10-25 DIAGNOSIS — G8918 Other acute postprocedural pain: Secondary | ICD-10-CM | POA: Diagnosis not present

## 2024-10-29 DIAGNOSIS — M6281 Muscle weakness (generalized): Secondary | ICD-10-CM | POA: Diagnosis not present

## 2024-10-29 DIAGNOSIS — M25611 Stiffness of right shoulder, not elsewhere classified: Secondary | ICD-10-CM | POA: Diagnosis not present

## 2024-10-29 DIAGNOSIS — M19011 Primary osteoarthritis, right shoulder: Secondary | ICD-10-CM | POA: Diagnosis not present

## 2024-10-30 ENCOUNTER — Encounter (INDEPENDENT_AMBULATORY_CARE_PROVIDER_SITE_OTHER): Payer: Self-pay | Admitting: Otolaryngology

## 2024-10-30 ENCOUNTER — Ambulatory Visit (INDEPENDENT_AMBULATORY_CARE_PROVIDER_SITE_OTHER): Admitting: Otolaryngology

## 2024-10-30 VITALS — BP 107/70 | HR 90 | Temp 99.1°F | Ht 70.0 in | Wt 130.4 lb

## 2024-10-30 DIAGNOSIS — R0982 Postnasal drip: Secondary | ICD-10-CM | POA: Diagnosis not present

## 2024-10-30 DIAGNOSIS — R0981 Nasal congestion: Secondary | ICD-10-CM

## 2024-10-30 DIAGNOSIS — J3489 Other specified disorders of nose and nasal sinuses: Secondary | ICD-10-CM

## 2024-10-30 DIAGNOSIS — J310 Chronic rhinitis: Secondary | ICD-10-CM

## 2024-10-30 DIAGNOSIS — J343 Hypertrophy of nasal turbinates: Secondary | ICD-10-CM

## 2024-10-30 MED ORDER — IPRATROPIUM BROMIDE 0.06 % NA SOLN: 2.0000 | Freq: Two times a day (BID) | NASAL | 12 refills | Status: AC | PRN

## 2024-10-30 NOTE — Progress Notes (Signed)
 CC: Chronic nasal congestion, nasal drainage  Discussed the use of AI scribe software for clinical note transcription with the patient, who gave verbal consent to proceed.  History of Present Illness [name] is a 73 year old female who presents with chronic nasal congestion, nasal drainage, and eye tearing.   She has experienced nasal congestion and nasal drainage for over a year. Her eyes are constantly tearing, and her nose is persistently dripping. She underwent a procedure at [facility] where stents were placed from her eyes into her nose, but her symptoms have worsened since then.  No recent sinus infections, but she feels 'stocked up' and needs to blow her nose frequently, with congestion returning quickly.  She has a history of seasonal allergies, managed with Allegra or similar medications. She has not used nasal sprays such as Flonase, Nasonex, or Atrovent. She has not seen an allergist in decades and has never undergone allergy testing or received allergy shots.   Past Medical History:  Diagnosis Date   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   Arthritis    back   Asthma    20 yrs., ago one episode   Cancer [facility])    Appendicial   Chronic migraine headaches 03/13/2008   Chronic rhinitis 03/13/2008   Deviated septum 01/23/2020   Dysesthesia    bilateral feet   Dysphagia, oropharyngeal phase 10/21/2009   Dyspnea    Gastroesophageal reflux disease 09/10/2008   Heart murmur    first noticed 41 yrs. 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 , low grade appendiceal mucinous neoplasm    HNP (herniated nucleus pulposus), cervical 05/17/2019   Hypokalemia 12/04/2019   Mitral valve prolapse    states no problem   Multiple sclerosis 1989   Myocarditis 04/25/2019   Neurogenic bladder    Obstructive chronic bronchitis with exacerbation  09/10/2008   Qualifier: Diagnosis of  By: Brien MD, Belvie BRAVO    Occipital neuralgia 05/06/2015   Osteoporosis    Pneumonia    PONV (postoperative nausea and vomiting)    Pulmonary nodule, right upper lobe 01/22/2009   Short of breath on exertion    Sjogren's disease    dryness of eyes, mouth   Spastic gait    Transverse myelitis    T4   Urinary frequency 02/05/2015   Vitamin D  deficiency 12/09/2016   Vocal cord disorder 10/21/2009   Wears bilateral ankle braces    AFO braces for stability    Past Surgical History:  Procedure Laterality Date   ANAL RECTAL MANOMETRY N/A 08/18/2016   Procedure: ANO RECTAL MANOMETRY;  Surgeon: Bernarda Ned, MD;  Location: [facility];  Service: Endoscopy;  Laterality: N/A;   ANTERIOR CERVICAL DECOMP/DISCECTOMY FUSION  2004;   2003   C4 -- C5 (2004)/   C3 -- C4 (2003)   ANTERIOR CERVICAL DECOMP/DISCECTOMY FUSION N/A 05/17/2019   Procedure: EVACUATION HEMATOMA OF POST OP ANTERIOR CERVICAL DECOMPRESSION FUSION;  Surgeon: Alix Charleston, MD;  Location: [facility] OR;  Service: Neurosurgery;  Laterality: N/A;   ANTERIOR CERVICAL DECOMP/DISCECTOMY FUSION N/A 05/17/2019   Procedure: REMOVAL OF TETHER CERVICAL PLATE, ANTERIOR CERVICAL DECOMPRESSION/DISCECTOMY FUSION CERVICAL FIVE- CERVICAL SIX, CERVICAL SIX- CERVICAL SEVEN;  Surgeon: Alix Charleston, MD;  Location: [facility] OR;  Service: Neurosurgery;  Laterality: N/A;   APPENDECTOMY  06/22/2011  laparoscopic   BILATERAL SALPINGOOPHORECTOMY  1982   BLADDER SUSPENSION  1994   sling   BREAST ENHANCEMENT SURGERY Bilateral    BREAST IMPLANT REMOVAL Bilateral 12/03/2008   BUNIONECTOMY     CATARACT EXTRACTION W/ INTRAOCULAR LENS  IMPLANT, BILATERAL  2015   ELBOW SURGERY Right    HEMORROIDECTOMY  08/05/2010;  2013   INSERTION SACRAL NERVE STIMULATOR TEST WIRE  09-28-2016   dr debby   INTERCOSTAL NERVE BLOCK  2015   occipital   KNEE SURGERY Right    MENISCUS REPAIR Left 09/26/2018   POSTERIOR CERVICAL  FUSION/FORAMINOTOMY N/A 02/03/2023   Procedure: POSTERIOR CERVICAL FUSION WITH LATERAL MASS FIXATION CERVICAL SIX-CERVICAL SEVEN; EXPLORE CERVICAL FIVE-CERVICAL SIX WITH POSSIBLE  FUSION;  Surgeon: Mavis Purchase, MD;  Location: [facility] OR;  Service: Neurosurgery;  Laterality: N/A;   RECTAL ULTRASOUND N/A 08/18/2016   Procedure: RECTAL ULTRASOUND;  Surgeon: Bernarda Debby, MD;  Location: [facility];  Service: Endoscopy;  Laterality: N/A;   SHOULDER ARTHROSCOPY Left    SHOULDER ARTHROSCOPY WITH DISTAL CLAVICLE RESECTION Right 09/21/2007   w/  Acrominoplasty,  Debridement Rotator Cuff,  CA ligament release   TONSILLECTOMY  age 89   TRIGGER FINGER RELEASE Left 01/16/2015   Procedure: LEFT THUMB TRIGGER RELEASE ;  Surgeon: Franky Curia, MD;  Location: [facility];  Service: Orthopedics;  Laterality: Left;   VAGINAL HYSTERECTOMY  1981    Family History  Problem Relation Age of Onset   Stroke Mother    Cancer Mother        oropharyngeal   Heart attack Mother    Anesthesia problems Mother        post-op nausea   Liver disease Father    Cirrhosis Father    Cancer Sister 3       breast ca, lung ca   Cancer Brother        twin; tonsillar?   Cancer Paternal Grandmother        cervical   Cancer Other        Nephew; appendiceal carcnoid, melanoma    Social History:  reports that she has never smoked. She has been exposed to tobacco smoke. She has never used smokeless tobacco. She reports that she does not drink alcohol  and does not use drugs.  Allergies:  Allergies  Allergen Reactions   Other Shortness Of Breath    PURPLE LETTUCE   Betaseron [Interferon Beta-1b] Other (See Comments)    HARD NODULAR AREAS AT INJECTION SITE   Codeine Nausea And Vomiting   Gilenya [Fingolimod Hydrochloride] Other (See Comments)    MACULAR EDEMA   Morphine And Codeine Other (See Comments)    IV ROUTE - RED STREAKS OF VEIN   Sumatriptan Nausea Only    INJECTABLE ONLY - RAPID HEART RATE,  FLUSHING   Tysabri [Natalizumab] Other (See Comments)    DEVELOPED JCV ANTIBODY   Zinc  Nicotinate [Zinc ]    Latex Rash    Prior to Admission medications   Medication Sig Start Date End Date Taking? Authorizing Provider  acetaminophen  (TYLENOL ) 500 MG tablet Take 1,000 mg by mouth every 8 (eight) hours as needed for moderate pain (pain score 4-6). 04/25/19  Yes [provider]  albuterol  (VENTOLIN  HFA) 108 (90 Base) MCG/ACT inhaler Inhale 2 puffs into the lungs every 6 (six) hours as needed for wheezing or shortness of breath. 02/08/24  Yes [provider]  amantadine  (SYMMETREL ) 100 MG capsule Take 100 mg by mouth at bedtime. 04/25/19  Yes [provider]  baclofen  (LIORESAL ) 10 MG tablet TAKE 1/2 TO 1 TABLET(5 TO 10 MG) BY MOUTH THREE TIMES DAILY AS NEEDED 10/08/24  Yes Sater, Charlie LABOR, MD  Biotin  5000 MCG CAPS Take 5,000 mcg by mouth daily.   Yes [provider]  caffeine  200 MG TABS tablet Take 200 mg by mouth daily.   Yes [provider]  calcium  carbonate (OS-CAL) 600 MG TABS tablet Take 1,200 mg by mouth.    Yes [provider]  cyclobenzaprine  (FLEXERIL ) 5 MG tablet Take 1 tablet (5 mg total) by mouth 3 (three) times daily as needed for muscle spasms. 02/04/23  Yes Mavis Purchase, MD  denosumab  (PROLIA ) 60 MG/ML SOLN injection Inject 60 mg into the skin every 6 (six) months. Administer in upper arm, thigh, or abdomen   Yes [provider]  Dexlansoprazole 30 MG capsule DR Take 30 mg by mouth daily.   Yes [provider]  donepezil  (ARICEPT ) 10 MG tablet TAKE 1 TABLET BY MOUTH AT BEDTIME 09/10/24  Yes Sater, Charlie LABOR, MD  estradiol (ESTRACE) 0.1 MG/GM vaginal cream Place 1 Applicatorful vaginally 3 (three) times a week. 09/28/21  Yes [provider]  fluticasone (FLONASE) 50 MCG/ACT nasal spray Place 2 sprays into both nostrils as needed. 04/04/24 04/04/25 Yes [provider]  furosemide  (LASIX ) 20 MG  tablet Take 20 mg by mouth as needed for fluid or edema. 10/25/19  Yes [provider]  loratadine  (CLARITIN ) 10 MG tablet Take 10 mg by mouth daily.   Yes [provider]  meloxicam (MOBIC) 15 MG tablet Take 15 mg by mouth as needed for pain. 05/20/24  Yes [provider]  methylphenidate  (RITALIN ) 10 MG tablet Take 1 tablet (10 mg total) by mouth 2 (two) times daily. 09/26/24  Yes Penumalli, Vikram R, MD  Multiple Vitamins-Minerals (MULTIVITAMIN PO) Take 1 tablet by mouth daily. Brand name - Mature Multivitamin from [employer]   Yes [provider]  potassium chloride  (KLOR-CON ) 20 MEQ packet Take 20 mEq by mouth as needed (with the furosemide ). 12/31/19  Yes [provider]  Probiotic Product (PHILLIPS COLON HEALTH PO) Take 1 tablet by mouth every morning.   Yes [provider]  rOPINIRole  (REQUIP ) 0.25 MG tablet TAKE 1 TO 2 TABLETS BY MOUTH AT NIGHT 08/09/24  Yes Sater, Charlie LABOR, MD  rosuvastatin (CRESTOR) 20 MG tablet Take 20 mg by mouth at bedtime.   Yes [provider]  vitamin B-12 (CYANOCOBALAMIN ) 1000 MCG tablet Take 1,000 mcg by mouth daily.   Yes [provider]  VITAMIN D  PO Take 2,000 Units by mouth daily.   Yes [provider]  ZETIA  10 MG tablet Take 10 mg by mouth daily. 01/20/23  Yes [provider]  docusate sodium  (COLACE) 100 MG capsule Take 1 capsule (100 mg total) by mouth 2 (two) times daily. Patient not taking: Reported on 10/30/2024 02/04/23   Mavis Purchase, MD  oxyCODONE -acetaminophen  (PERCOCET/ROXICET) 5-325 MG tablet Take 1-2 tablets by mouth every 4 (four) hours as needed for moderate pain. Patient not taking: Reported on 08/09/2024 02/04/23   Mavis Purchase, MD    Blood pressure 107/70, pulse 90, temperature 99.1 F (37.3 C), height 5' 10 (1.778 m), weight 130 lb 6.4 oz (59.1 kg), SpO2 95%. Exam: General: Communicates without difficulty, well nourished, no acute distress. Head:  Normocephalic, no evidence injury, no tenderness, facial buttresses intact without stepoff. Face/sinus: No tenderness to palpation and percussion. Facial movement is normal and symmetric.  Eyes: PERRL, EOMI. No scleral icterus, conjunctivae clear. Neuro: CN II exam reveals vision grossly intact.  No nystagmus at any point of gaze. Ears: Auricles well formed without lesions.  Ear canals are intact without mass or lesion.  No erythema or edema is appreciated.  The TMs are intact without fluid. Nose: External evaluation reveals normal support and skin without lesions.  Dorsum is intact.  Anterior rhinoscopy reveals congested mucosa over anterior aspect of inferior turbinates and intact septum.  No purulence noted. Oral:  Oral cavity and oropharynx are intact, symmetric, without erythema or edema.  Mucosa is moist without lesions. Neck: Full range of motion without pain.  There is no significant lymphadenopathy.  No masses palpable.  Thyroid  bed within normal limits to palpation.  Parotid glands and submandibular glands equal bilaterally without mass.  Trachea is midline. Neuro:  CN 2-12 grossly intact.   Procedure:  Flexible Nasal Endoscopy: Description: Risks, benefits, and alternatives of flexible endoscopy were explained to the patient.  Specific mention was made of the risk of throat numbness with difficulty swallowing, possible bleeding from the nose and mouth, and pain from the procedure.  The patient gave oral consent to proceed.  The flexible scope was inserted into the right nasal cavity.  Endoscopy of the interior nasal cavity, superior, inferior, and middle meatus was performed. The sphenoid-ethmoid recess was examined. Edematous mucosa was noted.  No polyp, mass, or lesion was appreciated. Olfactory cleft was clear.  Nasopharynx was clear.  Turbinates were hypertrophied but without mass.  The procedure was repeated on the contralateral side with similar findings.  The patient tolerated the procedure well.    Assessment and Plan Assessment & Plan Chronic nasal obstruction, secondary to nasal mucosal congestion and bilateral inferior turbinate hypertrophy.  She also has chronic nasal drainage. Chronic nasal congestion and rhinorrhea for over a year. Examination reveals significant nasal congestion and swollen inferior turbinates. No signs of infection in ears or nose.  - The patient is reassured and no infection is noted today. - Prescribed Atrovent nasal spray, two sprays in each nostril once or twice daily depending on severity of symptoms. - Advised to avoid use if symptoms are not problematic to prevent excessive dryness. - Scheduled follow-up appointment in two months to assess treatment efficacy. - Consider allergy referral and evaluation if she continues to be symptomatic.   [name] 10/30/2024, 4:19 PM

## 2024-10-31 DIAGNOSIS — M19011 Primary osteoarthritis, right shoulder: Secondary | ICD-10-CM | POA: Diagnosis not present

## 2024-10-31 DIAGNOSIS — M25611 Stiffness of right shoulder, not elsewhere classified: Secondary | ICD-10-CM | POA: Diagnosis not present

## 2024-10-31 DIAGNOSIS — M6281 Muscle weakness (generalized): Secondary | ICD-10-CM | POA: Diagnosis not present

## 2024-11-05 ENCOUNTER — Other Ambulatory Visit: Payer: Self-pay | Admitting: Neurology

## 2024-11-05 DIAGNOSIS — M6281 Muscle weakness (generalized): Secondary | ICD-10-CM | POA: Diagnosis not present

## 2024-11-05 DIAGNOSIS — M25611 Stiffness of right shoulder, not elsewhere classified: Secondary | ICD-10-CM | POA: Diagnosis not present

## 2024-11-05 DIAGNOSIS — M19011 Primary osteoarthritis, right shoulder: Secondary | ICD-10-CM | POA: Diagnosis not present

## 2024-11-05 MED ORDER — METHYLPHENIDATE HCL 10 MG PO TABS: 10.0000 mg | ORAL_TABLET | Freq: Two times a day (BID) | ORAL | 0 refills | Status: DC

## 2024-11-05 NOTE — Telephone Encounter (Signed)
 Pt called needing a refill request for her methylphenidate  (RITALIN ) 10 MG tablet to [facility] [address].

## 2024-11-05 NOTE — Telephone Encounter (Signed)
 Last seen 08/09/24 and next f/u 02/25/25. Last refilled 09/26/24 #60

## 2024-11-07 DIAGNOSIS — Z96611 Presence of right artificial shoulder joint: Secondary | ICD-10-CM | POA: Diagnosis not present

## 2024-11-12 ENCOUNTER — Telehealth: Payer: Self-pay | Admitting: Neurology

## 2024-11-12 DIAGNOSIS — M19011 Primary osteoarthritis, right shoulder: Secondary | ICD-10-CM | POA: Diagnosis not present

## 2024-11-12 DIAGNOSIS — M6281 Muscle weakness (generalized): Secondary | ICD-10-CM | POA: Diagnosis not present

## 2024-11-12 DIAGNOSIS — M25611 Stiffness of right shoulder, not elsewhere classified: Secondary | ICD-10-CM | POA: Diagnosis not present

## 2024-11-12 NOTE — Telephone Encounter (Signed)
 Pt is requesting a refill for methylphenidate  (RITALIN ) 10 MG tablet .  [facility] [phone]

## 2024-11-12 NOTE — Telephone Encounter (Signed)
 Last seen on 08/09/24 Follow up scheduled 02/25/25  Dispensed Days Supply Quantity Provider Pharmacy  METHYLPHENIDATE  HCL 10 MG TABLET 11/05/2024 30 60 tablet Sater, Charlie LABOR, MD [phone] - [facility]...       Called patient and left detailed message that Rx was just filled on 11/05/24 at same pharmacy.   I asked pt to call pharmacy to see if Rx is there.

## 2024-11-16 DIAGNOSIS — M6281 Muscle weakness (generalized): Secondary | ICD-10-CM | POA: Diagnosis not present

## 2024-11-16 DIAGNOSIS — M25611 Stiffness of right shoulder, not elsewhere classified: Secondary | ICD-10-CM | POA: Diagnosis not present

## 2024-11-16 DIAGNOSIS — M19011 Primary osteoarthritis, right shoulder: Secondary | ICD-10-CM | POA: Diagnosis not present

## 2024-11-19 DIAGNOSIS — M6281 Muscle weakness (generalized): Secondary | ICD-10-CM | POA: Diagnosis not present

## 2024-11-19 DIAGNOSIS — M25611 Stiffness of right shoulder, not elsewhere classified: Secondary | ICD-10-CM | POA: Diagnosis not present

## 2024-11-19 DIAGNOSIS — M19011 Primary osteoarthritis, right shoulder: Secondary | ICD-10-CM | POA: Diagnosis not present

## 2024-11-30 DIAGNOSIS — M25611 Stiffness of right shoulder, not elsewhere classified: Secondary | ICD-10-CM | POA: Diagnosis not present

## 2024-11-30 DIAGNOSIS — M6281 Muscle weakness (generalized): Secondary | ICD-10-CM | POA: Diagnosis not present

## 2024-11-30 DIAGNOSIS — M19011 Primary osteoarthritis, right shoulder: Secondary | ICD-10-CM | POA: Diagnosis not present

## 2024-12-03 DIAGNOSIS — M6281 Muscle weakness (generalized): Secondary | ICD-10-CM | POA: Diagnosis not present

## 2024-12-03 DIAGNOSIS — M19011 Primary osteoarthritis, right shoulder: Secondary | ICD-10-CM | POA: Diagnosis not present

## 2024-12-03 DIAGNOSIS — M25611 Stiffness of right shoulder, not elsewhere classified: Secondary | ICD-10-CM | POA: Diagnosis not present

## 2024-12-10 ENCOUNTER — Other Ambulatory Visit: Payer: Self-pay | Admitting: Neurology

## 2024-12-18 ENCOUNTER — Other Ambulatory Visit: Payer: Self-pay | Admitting: Neurology

## 2024-12-31 ENCOUNTER — Encounter (INDEPENDENT_AMBULATORY_CARE_PROVIDER_SITE_OTHER): Payer: Self-pay | Admitting: Otolaryngology

## 2024-12-31 ENCOUNTER — Other Ambulatory Visit: Payer: Self-pay | Admitting: Neurology

## 2024-12-31 ENCOUNTER — Ambulatory Visit (INDEPENDENT_AMBULATORY_CARE_PROVIDER_SITE_OTHER): Admitting: Otolaryngology

## 2024-12-31 ENCOUNTER — Encounter (INDEPENDENT_AMBULATORY_CARE_PROVIDER_SITE_OTHER): Payer: Self-pay

## 2024-12-31 VITALS — BP 132/81 | HR 72 | Ht 70.0 in | Wt 130.0 lb

## 2024-12-31 DIAGNOSIS — R0982 Postnasal drip: Secondary | ICD-10-CM | POA: Diagnosis not present

## 2024-12-31 DIAGNOSIS — J310 Chronic rhinitis: Secondary | ICD-10-CM | POA: Diagnosis not present

## 2024-12-31 MED ORDER — METHYLPHENIDATE HCL 10 MG PO TABS: 10.0000 mg | ORAL_TABLET | Freq: Two times a day (BID) | ORAL | 0 refills | Status: AC

## 2024-12-31 NOTE — Telephone Encounter (Signed)
 Requested Prescriptions   Pending Prescriptions Disp Refills   methylphenidate  (RITALIN ) 10 MG tablet 60 tablet 0    Sig: Take 1 tablet (10 mg total) by mouth 2 (two) times daily.   Last seen 08/09/24 Next appt 02/25/25 Dispenses   Dispensed Days Supply Quantity Provider Pharmacy  METHYLPHENIDATE  10 MG TABLET 11/05/2024 30 60 each Sater, Charlie LABOR, MD [facility] [phone] - [facility]...  METHYLPHENIDATE  10 MG TABLET 09/26/2024 30 60 each Penumalli, Vikram R, MD [facility] [phone] - [facility]...  METHYLPHENIDATE  10 MG TABLET 08/23/2024 30 60 each Sater, Charlie LABOR, MD [facility] [phone] - [facility]...  METHYLPHENIDATE  10 MG TABLET 07/17/2024 30 60 each Sater, Charlie LABOR, MD [facility] [phone] - G...  METHYLPHENIDATE  10 MG TABLET 06/08/2024 30 60 each Sater, Charlie LABOR, MD [facility] [phone] - G...  METHYLPHENIDATE  10 MG TABLET 05/10/2024 30 60 each Sater, Charlie LABOR, MD [facility] [phone] - [facility]...  METHYLPHENIDATE  10 MG TABLET 04/09/2024 30 60 each Sater, Charlie LABOR, MD [facility] [phone] - G...  METHYLPHENIDATE  10MG  TABLETS 03/05/2024 30 60 each Sater, Charlie LABOR, MD [facility] #...  METHYLPHENIDATE  10MG  TABLETS 01/17/2024 30 60 each Sater, Charlie LABOR, MD [facility] #.[email or web address]

## 2024-12-31 NOTE — Progress Notes (Signed)
 Patient ID: ADYA WIRZ, female   DOB: 30-Sep-1951, 74 y.o.   MRN: [id number]  Follow up: Chronic nasal drainage, nasal congestion  History of Present Illness [name] is a 74 year old female with chronic nasal drainage who presents for evaluation of persistent symptoms despite prior medical interventions.    She reports continuous anterior and posterior nasal drainage, unresponsive to nose blowing, and exacerbated by exposure to various odors. The severity of drainage is described as worse than ever and significantly impairs her quality of life. She denies nasal congestion and maintains normal nasal airflow.  She has been using Atrovent  nasal spray twice daily without symptomatic improvement.  She previously underwent outpatient placement of permanent nasal and lacrimal stents at [facility], but her symptoms have worsened since the procedure. She is unable to recall the specific provider involved in her prior care.  Exam: General: Communicates without difficulty, well nourished, no acute distress. Head: Normocephalic, no evidence injury, no tenderness, facial buttresses intact without stepoff. Face/sinus: No tenderness to palpation and percussion. Facial movement is normal and symmetric. Eyes: PERRL, EOMI. No scleral icterus, conjunctivae clear. Neuro: CN II exam reveals vision grossly intact.  No nystagmus at any point of gaze. Ears: Auricles well formed without lesions.  Ear canals are intact without mass or lesion.  No erythema or edema is appreciated.  The TMs are intact without fluid. Nose: External evaluation reveals normal support and skin without lesions.  Dorsum is intact.  Anterior rhinoscopy reveals congested mucosa over anterior aspect of inferior turbinates and intact septum.  No purulence noted. Oral:  Oral cavity and oropharynx are intact, symmetric, without erythema or edema.  Mucosa is moist without lesions. Neck: Full range of motion without pain.  There is no significant  lymphadenopathy.  No masses palpable.  Thyroid  bed within normal limits to palpation.  Parotid glands and submandibular glands equal bilaterally without mass.  Trachea is midline. Neuro:  CN 2-12 grossly intact.    Assessment and Plan Assessment & Plan Chronic nasal drainage Chronic nasal drainage persists despite ongoing use of Atrovent  nasal spray, which was ineffective. Symptoms are refractory to medical therapy and exacerbated by environmental irritants, significantly impacting her quality of life. - Recommended continuation of Atrovent  nasal spray twice daily. - Advised initiation of nasal saline irrigation (neti pot or squeeze bottle) once or twice daily. - Discussed referral to a rhinologist for evaluation and consideration of cryoablation therapy for refractory symptoms.

## 2024-12-31 NOTE — Telephone Encounter (Signed)
 Pt called to request medication refill methylphenidate  (RITALIN ) 10 MG tablet   [id number] [place], [place] - [address] (Ph: [phone])

## 2025-01-28 ENCOUNTER — Encounter (HOSPITAL_COMMUNITY): Admission: EM | Payer: Self-pay | Source: Home / Self Care | Attending: Cardiology

## 2025-01-28 ENCOUNTER — Encounter (HOSPITAL_COMMUNITY): Payer: Self-pay

## 2025-01-28 ENCOUNTER — Other Ambulatory Visit: Payer: Self-pay

## 2025-01-28 ENCOUNTER — Inpatient Hospital Stay (HOSPITAL_COMMUNITY): Admission: EM | Admit: 2025-01-28 | Source: Home / Self Care | Attending: Cardiology | Admitting: Cardiology

## 2025-01-28 ENCOUNTER — Inpatient Hospital Stay (HOSPITAL_COMMUNITY)

## 2025-01-28 DIAGNOSIS — I5023 Acute on chronic systolic (congestive) heart failure: Secondary | ICD-10-CM | POA: Diagnosis not present

## 2025-01-28 DIAGNOSIS — I5181 Takotsubo syndrome: Secondary | ICD-10-CM | POA: Diagnosis not present

## 2025-01-28 DIAGNOSIS — I213 ST elevation (STEMI) myocardial infarction of unspecified site: Principal | ICD-10-CM

## 2025-01-28 LAB — LIPID PANEL
Cholesterol: 174 mg/dL (ref 0–200)
HDL: 84 mg/dL
LDL Cholesterol: 63 mg/dL (ref 0–99)
Total CHOL/HDL Ratio: 2.1 ratio
Triglycerides: 137 mg/dL
VLDL: 27 mg/dL (ref 0–40)

## 2025-01-28 LAB — POCT I-STAT 7, (LYTES, BLD GAS, ICA,H+H)
Acid-Base Excess: 1 mmol/L (ref 0.0–2.0)
Bicarbonate: 25.4 mmol/L (ref 20.0–28.0)
Calcium, Ion: 1.21 mmol/L (ref 1.15–1.40)
HCT: 42 % (ref 36.0–46.0)
Hemoglobin: 14.3 g/dL (ref 12.0–15.0)
O2 Saturation: 98 %
Potassium: 3.7 mmol/L (ref 3.5–5.1)
Sodium: 140 mmol/L (ref 135–145)
TCO2: 26 mmol/L (ref 22–32)
pCO2 arterial: 37 mmHg (ref 32–48)
pH, Arterial: 7.445 (ref 7.35–7.45)
pO2, Arterial: 105 mmHg (ref 83–108)

## 2025-01-28 LAB — POCT I-STAT EG7
Acid-Base Excess: 2 mmol/L (ref 0.0–2.0)
Acid-Base Excess: 2 mmol/L (ref 0.0–2.0)
Bicarbonate: 27.9 mmol/L (ref 20.0–28.0)
Bicarbonate: 28.3 mmol/L — ABNORMAL HIGH (ref 20.0–28.0)
Calcium, Ion: 1.23 mmol/L (ref 1.15–1.40)
Calcium, Ion: 1.24 mmol/L (ref 1.15–1.40)
HCT: 42 % (ref 36.0–46.0)
HCT: 42 % (ref 36.0–46.0)
Hemoglobin: 14.3 g/dL (ref 12.0–15.0)
Hemoglobin: 14.3 g/dL (ref 12.0–15.0)
O2 Saturation: 54 %
O2 Saturation: 55 %
Potassium: 3.8 mmol/L (ref 3.5–5.1)
Potassium: 3.9 mmol/L (ref 3.5–5.1)
Sodium: 140 mmol/L (ref 135–145)
Sodium: 140 mmol/L (ref 135–145)
TCO2: 29 mmol/L (ref 22–32)
TCO2: 30 mmol/L (ref 22–32)
pCO2, Ven: 47.6 mmHg (ref 44–60)
pCO2, Ven: 48.2 mmHg (ref 44–60)
pH, Ven: 7.376 (ref 7.25–7.43)
pH, Ven: 7.377 (ref 7.25–7.43)
pO2, Ven: 29 mmHg — CL (ref 32–45)
pO2, Ven: 30 mmHg — CL (ref 32–45)

## 2025-01-28 LAB — COOXEMETRY PANEL
Carboxyhemoglobin: 0.9 % (ref 0.5–1.5)
Carboxyhemoglobin: 1.1 % (ref 0.5–1.5)
Methemoglobin: <0.7 % (ref 0.0–1.5)
Methemoglobin: <0.7 % (ref 0.0–1.5)
O2 Saturation: 57.2 %
O2 Saturation: 57.6 %
Total hemoglobin: 15 g/dL (ref 12.0–16.0)
Total hemoglobin: 15 g/dL (ref 12.0–16.0)

## 2025-01-28 LAB — BASIC METABOLIC PANEL WITH GFR
Anion gap: 12 (ref 5–15)
BUN: 21 mg/dL (ref 8–23)
CO2: 27 mmol/L (ref 22–32)
Calcium: 10.1 mg/dL (ref 8.9–10.3)
Chloride: 101 mmol/L (ref 98–111)
Creatinine, Ser: 0.91 mg/dL (ref 0.44–1.00)
GFR, Estimated: >60 mL/min
Glucose, Bld: 165 mg/dL — ABNORMAL HIGH (ref 70–99)
Potassium: 4 mmol/L (ref 3.5–5.1)
Sodium: 140 mmol/L (ref 135–145)

## 2025-01-28 LAB — CBC
HCT: 48.7 % — ABNORMAL HIGH (ref 36.0–46.0)
Hemoglobin: 16.4 g/dL — ABNORMAL HIGH (ref 12.0–15.0)
MCH: 31.5 pg (ref 26.0–34.0)
MCHC: 33.7 g/dL (ref 30.0–36.0)
MCV: 93.7 fL (ref 80.0–100.0)
Platelets: 234 10*3/uL (ref 150–400)
RBC: 5.2 MIL/uL — ABNORMAL HIGH (ref 3.87–5.11)
RDW: 12.8 % (ref 11.5–15.5)
WBC: 10.6 10*3/uL — ABNORMAL HIGH (ref 4.0–10.5)
nRBC: 0 % (ref 0.0–0.2)

## 2025-01-28 LAB — PROTIME-INR
INR: 0.9 (ref 0.8–1.2)
Prothrombin Time: 13 s (ref 11.4–15.2)

## 2025-01-28 LAB — MRSA NEXT GEN BY PCR, NASAL: MRSA by PCR Next Gen: NOT DETECTED

## 2025-01-28 LAB — APTT: aPTT: 34 s (ref 24–36)

## 2025-01-28 LAB — I-STAT CG4 LACTIC ACID, ED: Lactic Acid, Venous: 2.3 mmol/L (ref 0.5–1.9)

## 2025-01-28 LAB — HEMOGLOBIN A1C
Hgb A1c MFr Bld: 5.4 % (ref 4.8–5.6)
Mean Plasma Glucose: 108.28 mg/dL

## 2025-01-28 LAB — POCT I-STAT, CHEM 8
BUN: 24 mg/dL — ABNORMAL HIGH (ref 8–23)
Calcium, Ion: 1.19 mmol/L (ref 1.15–1.40)
Chloride: 104 mmol/L (ref 98–111)
Creatinine, Ser: 0.8 mg/dL (ref 0.44–1.00)
Glucose, Bld: 202 mg/dL — ABNORMAL HIGH (ref 70–99)
HCT: 43 % (ref 36.0–46.0)
Hemoglobin: 14.6 g/dL (ref 12.0–15.0)
Potassium: 3.5 mmol/L (ref 3.5–5.1)
Sodium: 141 mmol/L (ref 135–145)
TCO2: 24 mmol/L (ref 22–32)

## 2025-01-28 LAB — CG4 I-STAT (LACTIC ACID): Lactic Acid, Venous: 1.6 mmol/L (ref 0.5–1.9)

## 2025-01-28 LAB — TROPONIN T, HIGH SENSITIVITY
Troponin T High Sensitivity: 633 ng/L (ref 0–19)
Troponin T High Sensitivity: 2901 ng/L (ref 0–19)

## 2025-01-28 MED ORDER — ACETAMINOPHEN 325 MG PO TABS
650.0000 mg | ORAL_TABLET | ORAL | Status: DC | PRN
  Administered 2025-01-28: 650 mg via ORAL
  Filled 2025-01-28: qty 2

## 2025-01-28 MED ORDER — FUROSEMIDE 10 MG/ML IJ SOLN
INTRAMUSCULAR | Status: AC
  Filled 2025-01-28: qty 4

## 2025-01-28 MED ORDER — FUROSEMIDE 10 MG/ML IJ SOLN
INTRAMUSCULAR | Status: DC | PRN
  Administered 2025-01-28: 40 mg via INTRAVENOUS

## 2025-01-28 MED ORDER — DONEPEZIL HCL 10 MG PO TABS
10.0000 mg | ORAL_TABLET | Freq: Every day | ORAL | Status: AC
  Administered 2025-01-28 – 2025-02-01 (×5): 10 mg via ORAL
  Filled 2025-01-28 (×5): qty 1

## 2025-01-28 MED ORDER — HEPARIN SODIUM (PORCINE) 1000 UNIT/ML IJ SOLN
INTRAMUSCULAR | Status: AC
  Filled 2025-01-28: qty 10

## 2025-01-28 MED ORDER — HEPARIN (PORCINE) IN NACL 1000-0.9 UT/500ML-% IV SOLN
INTRAVENOUS | Status: DC | PRN
  Administered 2025-01-28: 1000 mL

## 2025-01-28 MED ORDER — MILRINONE LACTATE IN DEXTROSE 20-5 MG/100ML-% IV SOLN
INTRAVENOUS | Status: AC
  Administered 2025-01-28: 0.25 ug/kg/min via INTRAVENOUS
  Filled 2025-01-28: qty 100

## 2025-01-28 MED ORDER — FENTANYL CITRATE (PF) 50 MCG/ML IJ SOSY: 50.0000 ug | PREFILLED_SYRINGE | Freq: Once | INTRAMUSCULAR | Status: DC

## 2025-01-28 MED ORDER — VERAPAMIL HCL 2.5 MG/ML IV SOLN
INTRAVENOUS | Status: AC
  Filled 2025-01-28: qty 2

## 2025-01-28 MED ORDER — ASPIRIN 81 MG PO CHEW
324.0000 mg | CHEWABLE_TABLET | Freq: Once | ORAL | Status: AC
  Administered 2025-01-28: 324 mg via ORAL

## 2025-01-28 MED ORDER — ONDANSETRON HCL 4 MG/2ML IJ SOLN
4.0000 mg | Freq: Four times a day (QID) | INTRAMUSCULAR | Status: AC | PRN
  Administered 2025-01-29: 4 mg via INTRAVENOUS
  Filled 2025-01-28: qty 2

## 2025-01-28 MED ORDER — PANTOPRAZOLE SODIUM 40 MG PO TBEC
40.0000 mg | DELAYED_RELEASE_TABLET | Freq: Every day | ORAL | Status: AC
  Administered 2025-01-28 – 2025-02-01 (×5): 40 mg via ORAL
  Filled 2025-01-28 (×5): qty 1

## 2025-01-28 MED ORDER — HEPARIN SODIUM (PORCINE) 5000 UNIT/ML IJ SOLN
5000.0000 [IU] | Freq: Three times a day (TID) | INTRAMUSCULAR | Status: DC
  Administered 2025-01-28: 5000 [IU] via SUBCUTANEOUS
  Filled 2025-01-28: qty 1

## 2025-01-28 MED ORDER — MILRINONE LACTATE IN DEXTROSE 20-5 MG/100ML-% IV SOLN: 0.2500 ug/kg/min | INTRAVENOUS | Status: DC

## 2025-01-28 MED ORDER — HEPARIN SODIUM (PORCINE) 5000 UNIT/ML IJ SOLN
4000.0000 [IU] | Freq: Once | INTRAMUSCULAR | Status: AC
  Administered 2025-01-28: 4000 [IU] via INTRAVENOUS

## 2025-01-28 MED ORDER — MILRINONE LACTATE IN DEXTROSE 20-5 MG/100ML-% IV SOLN
0.3750 ug/kg/min | INTRAVENOUS | Status: DC
  Administered 2025-01-28: 0.25 ug/kg/min via INTRAVENOUS
  Administered 2025-01-29 (×2): 0.375 ug/kg/min via INTRAVENOUS
  Administered 2025-01-30: 0.125 ug/kg/min via INTRAVENOUS
  Filled 2025-01-28 (×3): qty 100

## 2025-01-28 MED ORDER — CHLORHEXIDINE GLUCONATE CLOTH 2 % EX PADS
6.0000 | MEDICATED_PAD | Freq: Every day | CUTANEOUS | Status: AC
  Administered 2025-01-28 – 2025-02-01 (×5): 6 via TOPICAL

## 2025-01-28 MED ORDER — SODIUM CHLORIDE 0.9 % IV SOLN
INTRAVENOUS | Status: AC | PRN
  Administered 2025-01-28 (×2): 10 mL/h via INTRAVENOUS

## 2025-01-28 MED ORDER — SODIUM CHLORIDE 0.9 % IV SOLN: 250.0000 mL | INTRAVENOUS | Status: AC | PRN

## 2025-01-28 MED ORDER — LIDOCAINE HCL (PF) 1 % IJ SOLN
INTRAMUSCULAR | Status: DC | PRN
  Administered 2025-01-28: 5 mL via INTRADERMAL

## 2025-01-28 MED ORDER — LIDOCAINE HCL (PF) 1 % IJ SOLN
INTRAMUSCULAR | Status: AC
  Filled 2025-01-28: qty 30

## 2025-01-28 MED ORDER — AMANTADINE HCL 100 MG PO CAPS
100.0000 mg | ORAL_CAPSULE | Freq: Every day | ORAL | Status: DC
  Administered 2025-01-28: 100 mg via ORAL
  Filled 2025-01-28 (×2): qty 1

## 2025-01-28 MED ORDER — METHYLPHENIDATE HCL 10 MG PO TABS: 10.0000 mg | ORAL_TABLET | Freq: Two times a day (BID) | ORAL | Status: DC

## 2025-01-28 MED ORDER — SODIUM CHLORIDE 0.9 % IV BOLUS
250.0000 mL | Freq: Once | INTRAVENOUS | Status: AC
  Administered 2025-01-28: 250 mL via INTRAVENOUS

## 2025-01-28 MED ORDER — EZETIMIBE 10 MG PO TABS
10.0000 mg | ORAL_TABLET | Freq: Every day | ORAL | Status: AC
  Administered 2025-01-28 – 2025-02-01 (×5): 10 mg via ORAL
  Filled 2025-01-28 (×5): qty 1

## 2025-01-28 MED ORDER — SODIUM CHLORIDE 0.9% FLUSH: 3.0000 mL | INTRAVENOUS | Status: AC | PRN

## 2025-01-28 MED ORDER — ONDANSETRON HCL 4 MG/2ML IJ SOLN: 4.0000 mg | Freq: Once | INTRAMUSCULAR | Status: DC

## 2025-01-28 MED ORDER — VERAPAMIL HCL 2.5 MG/ML IV SOLN
INTRAVENOUS | Status: DC | PRN
  Administered 2025-01-28: 10 mL via INTRA_ARTERIAL

## 2025-01-28 MED ORDER — LACTATED RINGERS IV BOLUS
500.0000 mL | Freq: Once | INTRAVENOUS | Status: AC
  Administered 2025-01-28: 500 mL via INTRAVENOUS

## 2025-01-28 MED ORDER — DIGOXIN 125 MCG PO TABS
0.1250 mg | ORAL_TABLET | Freq: Every day | ORAL | Status: AC
  Administered 2025-01-28 – 2025-02-01 (×5): 0.125 mg via ORAL
  Filled 2025-01-28 (×5): qty 1

## 2025-01-28 MED ORDER — HEPARIN SODIUM (PORCINE) 1000 UNIT/ML IJ SOLN
INTRAMUSCULAR | Status: DC | PRN
  Administered 2025-01-28: 6000 [IU] via INTRAVENOUS

## 2025-01-28 MED ORDER — SODIUM CHLORIDE 0.9% FLUSH
3.0000 mL | Freq: Two times a day (BID) | INTRAVENOUS | Status: AC
  Administered 2025-01-28 – 2025-02-01 (×9): 3 mL via INTRAVENOUS

## 2025-01-28 MED ORDER — ROSUVASTATIN CALCIUM 20 MG PO TABS
20.0000 mg | ORAL_TABLET | Freq: Every day | ORAL | Status: AC
  Administered 2025-01-28 – 2025-02-01 (×5): 20 mg via ORAL
  Filled 2025-01-28 (×5): qty 1

## 2025-01-28 MED ORDER — SODIUM CHLORIDE 0.9 % IV SOLN: INTRAVENOUS | Status: AC

## 2025-01-28 MED ORDER — IOHEXOL 350 MG/ML SOLN
INTRAVENOUS | Status: DC | PRN
  Administered 2025-01-28: 50 mL

## 2025-01-28 NOTE — Progress Notes (Signed)
 SPIRITUAL CARE AND COUNSELING CONSULT NOTE   VISIT SUMMARY Chaplain responded to Code Stemi. Escorted patient's husband, Charlena, to [facility] waiting area. Provided prayer in Cath Lab per Tom's request and with pt [name]'s consent.  Chaplains will follow up as able and continue to remain available.   SPIRITUAL ENCOUNTER                                                                                                                                                                      Type of Visit: Initial Care provided to:: Pt and family Conversation partners present during encounter: Nurse Referral source: Code page Reason for visit: Code OnCall Visit: No  INTERVENTIONS   Spiritual Care Interventions Made: Established relationship of care and support, Compassionate presence, Reflective listening, Prayer   If immediate needs arise, please contact [facility] 24 hour on call [phone]   Donnice JINNY Shuck, Chaplain  01/28/2025 5:07 PM

## 2025-01-28 NOTE — ED Triage Notes (Signed)
 Patient states that she started getting shob a couple hours ago and she is starting to get chest tightness and increased HR. She states that shob is worse than the heart rate and her blood pressure has been high as well. Denies being sick recently and denies fever.

## 2025-01-28 NOTE — Progress Notes (Signed)
 PAP 25/11, CVP 5; CI up to 1.6 from 1.3.  Co-ox: 57->57 MAP: 65-70 (by cuff)  HR 106. Still w/ ST seg changes.  Lactic acid 0.75

## 2025-01-28 NOTE — Progress Notes (Signed)
 Advanced HF/Shock Team Notr  74 yo woman with multiple sclerosis. 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    Admitted with ACS with severe CP, marked anterior ST elevation and hstrop ~600.   Taken emergent to cath lab by Dr Jordan. Minimal CAD. Severe LV dysfunction EF ~20% with apical ballooning.   RHC with PCWP~ 30 and CI 1.4  I saw her in ICU. CP resolving but still with anterior ST elevation on monitor. Denies recent stressful events.   HR 110-120 sbp 110   Swan numbers done personally   Cvp 6 PA 35/18 Fick CI 1.5  SVR 2400.   Sitting up in bed  Neck supple RIJ swan  Reg tachy no rub  Lungs clear  Ab soft nt  Ext warm. No edema.   Assessment 1) acute systolic HF -> cardiogenic shock 2) multiple sclerosis   Plan/Assessment   She has cardiogenic shock in setting of recurrent takotsubo CM.  Will start milrinone  and digoxin . Follow closely for need for mechanical support. Will need cMRI this admit.   Critical Care Time 50 mins   Toribio Fuel, MD

## 2025-01-29 ENCOUNTER — Inpatient Hospital Stay (HOSPITAL_COMMUNITY)

## 2025-01-29 DIAGNOSIS — I5021 Acute systolic (congestive) heart failure: Secondary | ICD-10-CM | POA: Diagnosis not present

## 2025-01-29 LAB — ECHOCARDIOGRAM COMPLETE
AR max vel: 2.11 cm2
AV Area VTI: 2.23 cm2
AV Area mean vel: 2.19 cm2
AV Mean grad: 10.3 mmHg
AV Peak grad: 19 mmHg
Ao pk vel: 2.18 m/s
S' Lateral: 3.2 cm
Weight: 2186.96 [oz_av]

## 2025-01-29 LAB — BASIC METABOLIC PANEL WITH GFR
Anion gap: 11 (ref 5–15)
BUN: 19 mg/dL (ref 8–23)
CO2: 27 mmol/L (ref 22–32)
Calcium: 8.7 mg/dL — ABNORMAL LOW (ref 8.9–10.3)
Chloride: 100 mmol/L (ref 98–111)
Creatinine, Ser: 0.87 mg/dL (ref 0.44–1.00)
GFR, Estimated: >60 mL/min
Glucose, Bld: 216 mg/dL — ABNORMAL HIGH (ref 70–99)
Potassium: 3.8 mmol/L (ref 3.5–5.1)
Sodium: 138 mmol/L (ref 135–145)

## 2025-01-29 LAB — CBC
HCT: 40.8 % (ref 36.0–46.0)
Hemoglobin: 13.6 g/dL (ref 12.0–15.0)
MCH: 31.1 pg (ref 26.0–34.0)
MCHC: 33.3 g/dL (ref 30.0–36.0)
MCV: 93.2 fL (ref 80.0–100.0)
Platelets: 218 10*3/uL (ref 150–400)
RBC: 4.38 MIL/uL (ref 3.87–5.11)
RDW: 13 % (ref 11.5–15.5)
WBC: 12.6 10*3/uL — ABNORMAL HIGH (ref 4.0–10.5)
nRBC: 0 % (ref 0.0–0.2)

## 2025-01-29 LAB — GLUCOSE, CAPILLARY
Glucose-Capillary: 178 mg/dL — ABNORMAL HIGH (ref 70–99)
Glucose-Capillary: 107 mg/dL — ABNORMAL HIGH (ref 70–99)
Glucose-Capillary: 110 mg/dL — ABNORMAL HIGH (ref 70–99)
Glucose-Capillary: 125 mg/dL — ABNORMAL HIGH (ref 70–99)

## 2025-01-29 LAB — COOXEMETRY PANEL
Carboxyhemoglobin: 1.1 % (ref 0.5–1.5)
Carboxyhemoglobin: 1.2 % (ref 0.5–1.5)
Methemoglobin: <0.7 % (ref 0.0–1.5)
Methemoglobin: <0.7 % (ref 0.0–1.5)
O2 Saturation: 56.5 %
O2 Saturation: 57.9 %
Total hemoglobin: 13.5 g/dL (ref 12.0–16.0)
Total hemoglobin: 13.9 g/dL (ref 12.0–16.0)

## 2025-01-29 LAB — TROPONIN T, HIGH SENSITIVITY: Troponin T High Sensitivity: 1985 ng/L (ref 0–19)

## 2025-01-29 LAB — MAGNESIUM: Magnesium: 1.7 mg/dL (ref 1.7–2.4)

## 2025-01-29 LAB — CG4 I-STAT (LACTIC ACID): Lactic Acid, Venous: 0.8 mmol/L (ref 0.5–1.9)

## 2025-01-29 MED ORDER — NOREPINEPHRINE 4 MG/250ML-% IV SOLN
0.0000 ug/min | INTRAVENOUS | Status: AC
  Administered 2025-01-29: 2 ug/min via INTRAVENOUS
  Administered 2025-01-29 – 2025-01-30 (×2): 3 ug/min via INTRAVENOUS
  Administered 2025-01-30: 1 ug/min via INTRAVENOUS
  Administered 2025-01-31: 3 ug/min via INTRAVENOUS
  Filled 2025-01-29 (×2): qty 250
  Filled 2025-01-29: qty 500
  Filled 2025-01-29 (×2): qty 250

## 2025-01-29 MED ORDER — MAGNESIUM SULFATE 4 GM/100ML IV SOLN
4.0000 g | Freq: Once | INTRAVENOUS | Status: AC
  Administered 2025-01-29: 4 g via INTRAVENOUS
  Filled 2025-01-29: qty 100

## 2025-01-29 MED ORDER — ROPINIROLE HCL 0.25 MG PO TABS
0.2500 mg | ORAL_TABLET | Freq: Every day | ORAL | Status: AC
  Administered 2025-01-29 – 2025-02-01 (×4): 0.25 mg via ORAL
  Filled 2025-01-29 (×4): qty 1

## 2025-01-29 MED ORDER — ENOXAPARIN SODIUM 60 MG/0.6ML IJ SOSY
60.0000 mg | PREFILLED_SYRINGE | Freq: Two times a day (BID) | INTRAMUSCULAR | Status: DC
  Administered 2025-01-29 – 2025-01-30 (×3): 60 mg via SUBCUTANEOUS
  Filled 2025-01-29 (×3): qty 0.6

## 2025-01-29 MED ORDER — INSULIN ASPART 100 UNIT/ML IJ SOLN
0.0000 [IU] | Freq: Three times a day (TID) | INTRAMUSCULAR | Status: AC
  Administered 2025-01-29: 2 [IU] via SUBCUTANEOUS
  Administered 2025-01-30 (×2): 1 [IU] via SUBCUTANEOUS
  Administered 2025-01-31: 2 [IU] via SUBCUTANEOUS
  Administered 2025-01-31 – 2025-02-01 (×3): 1 [IU] via SUBCUTANEOUS
  Filled 2025-01-29 (×4): qty 1
  Filled 2025-01-29: qty 2
  Filled 2025-01-29: qty 4

## 2025-01-29 MED ORDER — PERFLUTREN LIPID MICROSPHERE
1.0000 mL | INTRAVENOUS | Status: AC | PRN
  Administered 2025-01-29: 3 mL via INTRAVENOUS

## 2025-01-29 MED ORDER — POTASSIUM CHLORIDE CRYS ER 20 MEQ PO TBCR
40.0000 meq | EXTENDED_RELEASE_TABLET | Freq: Once | ORAL | Status: AC
  Administered 2025-01-29: 40 meq via ORAL
  Filled 2025-01-29: qty 2

## 2025-01-29 MED ORDER — INSULIN ASPART 100 UNIT/ML IJ SOLN: 0.0000 [IU] | Freq: Every day | INTRAMUSCULAR | Status: AC

## 2025-01-29 MED ORDER — ASPIRIN 81 MG PO TBEC
81.0000 mg | DELAYED_RELEASE_TABLET | Freq: Every day | ORAL | Status: DC
  Administered 2025-01-29 – 2025-02-01 (×4): 81 mg via ORAL
  Filled 2025-01-29 (×4): qty 1

## 2025-01-29 NOTE — Plan of Care (Signed)
  Problem: Education: Goal: Knowledge of General Education information will improve Description: Including pain rating scale, medication(s)/side effects and non-pharmacologic comfort measures Outcome: Progressing   Problem: Health Behavior/Discharge Planning: Goal: Ability to manage health-related needs will improve Outcome: Progressing   Problem: Clinical Measurements: Goal: Ability to maintain clinical measurements within normal limits will improve Outcome: Progressing Goal: Will remain free from infection Outcome: Progressing Goal: Diagnostic test results will improve Outcome: Progressing Goal: Respiratory complications will improve Outcome: Progressing Goal: Cardiovascular complication will be avoided Outcome: Progressing   Problem: Activity: Goal: Risk for activity intolerance will decrease Outcome: Progressing   Problem: Nutrition: Goal: Adequate nutrition will be maintained Outcome: Progressing   Problem: Coping: Goal: Level of anxiety will decrease Outcome: Progressing   Problem: Elimination: Goal: Will not experience complications related to bowel motility Outcome: Progressing Goal: Will not experience complications related to urinary retention Outcome: Progressing   Problem: Pain Managment: Goal: General experience of comfort will improve and/or be controlled Outcome: Progressing   Problem: Safety: Goal: Ability to remain free from injury will improve Outcome: Progressing   Problem: Skin Integrity: Goal: Risk for impaired skin integrity will decrease Outcome: Progressing   Problem: Education: Goal: Understanding of CV disease, CV risk reduction, and recovery process will improve Outcome: Progressing   Problem: Activity: Goal: Ability to return to baseline activity level will improve Outcome: Progressing   Problem: Cardiovascular: Goal: Ability to achieve and maintain adequate cardiovascular perfusion will improve Outcome: Progressing Goal:  Vascular access site(s) Level 0-1 will be maintained Outcome: Progressing   Problem: Health Behavior/Discharge Planning: Goal: Ability to safely manage health-related needs after discharge will improve Outcome: Progressing   Problem: Education: Goal: Ability to describe self-care measures that may prevent or decrease complications (Diabetes Survival Skills Education) will improve Outcome: Progressing   Problem: Coping: Goal: Ability to adjust to condition or change in health will improve Outcome: Progressing   Problem: Fluid Volume: Goal: Ability to maintain a balanced intake and output will improve Outcome: Progressing   Problem: Health Behavior/Discharge Planning: Goal: Ability to identify and utilize available resources and services will improve Outcome: Progressing Goal: Ability to manage health-related needs will improve Outcome: Progressing   Problem: Metabolic: Goal: Ability to maintain appropriate glucose levels will improve Outcome: Progressing   Problem: Nutritional: Goal: Maintenance of adequate nutrition will improve Outcome: Progressing Goal: Progress toward achieving an optimal weight will improve Outcome: Progressing   Problem: Skin Integrity: Goal: Risk for impaired skin integrity will decrease Outcome: Progressing   Problem: Tissue Perfusion: Goal: Adequacy of tissue perfusion will improve Outcome: Progressing

## 2025-01-29 NOTE — Progress Notes (Signed)
 NE added for SBP goal > 90.

## 2025-01-29 NOTE — Progress Notes (Signed)
 Echocardiogram 2D Echocardiogram has been performed.  [name] 01/29/2025, 10:39 AM

## 2025-01-30 ENCOUNTER — Other Ambulatory Visit (HOSPITAL_COMMUNITY): Payer: Self-pay

## 2025-01-30 ENCOUNTER — Other Ambulatory Visit: Payer: Self-pay

## 2025-01-30 ENCOUNTER — Telehealth (HOSPITAL_COMMUNITY): Payer: Self-pay | Admitting: Pharmacy Technician

## 2025-01-30 LAB — GLUCOSE, CAPILLARY
Glucose-Capillary: 124 mg/dL — ABNORMAL HIGH (ref 70–99)
Glucose-Capillary: 101 mg/dL — ABNORMAL HIGH (ref 70–99)
Glucose-Capillary: 124 mg/dL — ABNORMAL HIGH (ref 70–99)
Glucose-Capillary: 104 mg/dL — ABNORMAL HIGH (ref 70–99)

## 2025-01-30 LAB — CBC
HCT: 38.5 % (ref 36.0–46.0)
Hemoglobin: 12.7 g/dL (ref 12.0–15.0)
MCH: 31.1 pg (ref 26.0–34.0)
MCHC: 33 g/dL (ref 30.0–36.0)
MCV: 94.1 fL (ref 80.0–100.0)
Platelets: 159 10*3/uL (ref 150–400)
RBC: 4.09 MIL/uL (ref 3.87–5.11)
RDW: 13.2 % (ref 11.5–15.5)
WBC: 11.6 10*3/uL — ABNORMAL HIGH (ref 4.0–10.5)
nRBC: 0 % (ref 0.0–0.2)

## 2025-01-30 LAB — COOXEMETRY PANEL
Carboxyhemoglobin: 1.5 % (ref 0.5–1.5)
Carboxyhemoglobin: 1.5 % (ref 0.5–1.5)
Methemoglobin: 0.8 % (ref 0.0–1.5)
Methemoglobin: 1.3 % (ref 0.0–1.5)
O2 Saturation: 67.8 %
O2 Saturation: 67.7 %
Total hemoglobin: 13.1 g/dL (ref 12.0–16.0)
Total hemoglobin: 12.7 g/dL (ref 12.0–16.0)

## 2025-01-30 LAB — BASIC METABOLIC PANEL WITH GFR
Anion gap: 6 (ref 5–15)
BUN: 18 mg/dL (ref 8–23)
CO2: 29 mmol/L (ref 22–32)
Calcium: 8.2 mg/dL — ABNORMAL LOW (ref 8.9–10.3)
Chloride: 103 mmol/L (ref 98–111)
Creatinine, Ser: 0.81 mg/dL (ref 0.44–1.00)
GFR, Estimated: >60 mL/min
Glucose, Bld: 132 mg/dL — ABNORMAL HIGH (ref 70–99)
Potassium: 4.3 mmol/L (ref 3.5–5.1)
Sodium: 138 mmol/L (ref 135–145)

## 2025-01-30 LAB — MAGNESIUM: Magnesium: 2.3 mg/dL (ref 1.7–2.4)

## 2025-01-30 MED ORDER — SODIUM CHLORIDE 0.9% FLUSH
10.0000 mL | Freq: Two times a day (BID) | INTRAVENOUS | Status: AC
  Administered 2025-01-30 – 2025-02-01 (×5): 10 mL

## 2025-01-30 MED ORDER — SODIUM CHLORIDE 0.9% FLUSH
10.0000 mL | Freq: Two times a day (BID) | INTRAVENOUS | Status: AC
  Administered 2025-01-30 – 2025-02-01 (×4): 10 mL

## 2025-01-30 MED ORDER — ORAL CARE MOUTH RINSE: 15.0000 mL | OROMUCOSAL | Status: AC | PRN

## 2025-01-30 MED ORDER — ENSURE PLUS HIGH PROTEIN PO LIQD
237.0000 mL | Freq: Two times a day (BID) | ORAL | Status: AC
  Administered 2025-01-30 – 2025-02-01 (×5): 237 mL via ORAL

## 2025-01-30 MED ORDER — ACETAMINOPHEN 325 MG PO TABS
650.0000 mg | ORAL_TABLET | ORAL | Status: AC | PRN
  Administered 2025-01-30 – 2025-02-01 (×7): 650 mg via ORAL
  Filled 2025-01-30 (×7): qty 2

## 2025-01-30 MED ORDER — SODIUM CHLORIDE 0.9% FLUSH: 10.0000 mL | INTRAVENOUS | Status: AC | PRN

## 2025-01-30 MED ORDER — APIXABAN 5 MG PO TABS
5.0000 mg | ORAL_TABLET | Freq: Two times a day (BID) | ORAL | Status: AC
  Administered 2025-01-30 – 2025-02-01 (×5): 5 mg via ORAL
  Filled 2025-01-30 (×5): qty 1

## 2025-01-30 NOTE — Evaluation (Signed)
 Physical Therapy Evaluation Patient Details Name: [name] MRN: [id number] DOB: 07/19/1951 Today's Date: 01/30/2025  History of Present Illness  Pt is 74 year old presented to [facility] on  01/28/25 for chest pain. Pt with cardiogenic shock in the setting of recurrent Takotsubo cardiomyopathy.  PMH - rt TSA (October 2025), ACDF, asthma, migraines, Takotsubo cardiomyopathy, heart murmur, hiatal hernia, MS, myocarditis, occipital neuralgia, transverse myelitis  Clinical Impression  Pt admitted with above diagnosis and presents to PT with functional limitations due to deficits listed below (See PT problem list). Pt needs skilled PT to maximize independence and safety. Pt lives at home with supportive husband and typically is independent and active. Has felt generally weak in weeks prior to admission. Pt did well today mobilizing for the first time in several days. Very motivated and expect she will make excellent progress to return home with husband.           If plan is discharge home, recommend the following: Assistance with cooking/housework;Assist for transportation;A little help with walking and/or transfers;A little help with bathing/dressing/bathroom   Can travel by private vehicle        Equipment Recommendations Other (comment) (To be determined)  Recommendations for Other Services       Functional Status Assessment Patient has had a recent decline in their functional status and demonstrates the ability to make significant improvements in function in a reasonable and predictable amount of time.     Precautions / Restrictions Precautions Precautions: Fall;Other (comment) Recall of Precautions/Restrictions: Intact Precaution/Restrictions Comments: watch BP Restrictions Weight Bearing Restrictions Per Provider Order: No      Mobility  Bed Mobility Overal bed mobility: Needs Assistance Bed Mobility: Supine to Sit     Supine to sit: Contact guard           Transfers Overall transfer level: Needs assistance Equipment used: Rollator (4 wheels) Transfers: Sit to/from Stand Sit to Stand: Min assist, +2 safety/equipment           General transfer comment: Assist to power up    Ambulation/Gait Ambulation/Gait assistance: Contact guard assist, +2 safety/equipment Gait Distance (Feet): 240 Feet Assistive device: Rollator (4 wheels) Gait Pattern/deviations: Step-through pattern, Decreased stride length Gait velocity: decr Gait velocity interpretation: 1.31 - 2.62 ft/sec, indicative of limited community ambulator   General Gait Details: Assist for safety and lines  Stairs            Wheelchair Mobility     Tilt Bed    Modified Rankin (Stroke Patients Only)       Balance Overall balance assessment: Needs assistance Sitting-balance support: No upper extremity supported, Feet supported Sitting balance-Leahy Scale: Good     Standing balance support: No upper extremity supported, During functional activity Standing balance-Leahy Scale: Fair                               Pertinent Vitals/Pain Pain Assessment Pain Assessment: No/denies pain    Home Living Family/patient expects to be discharged to:: Private residence Living Arrangements: Spouse/significant other Available Help at Discharge: Family;Available 24 hours/day Type of Home: House Home Access: Level entry       Home Layout: One level Home Equipment: Cane - single point;Hand held shower head;Grab bars - toilet;Grab bars - tub/shower;Electric scooter      Prior Function Prior Level of Function : Independent/Modified Independent             Mobility Comments: No  assistive device. Reports fatigue for several weeks prior to admission.       Extremity/Trunk Assessment   Upper Extremity Assessment Upper Extremity Assessment: Defer to OT evaluation    Lower Extremity Assessment Lower Extremity Assessment: Generalized weakness        Communication   Communication Communication: No apparent difficulties    Cognition Arousal: Alert Behavior During Therapy: WFL for tasks assessed/performed   PT - Cognitive impairments: No apparent impairments                         Following commands: Intact       Cueing Cueing Techniques: Verbal cues     General Comments General comments (skin integrity, edema, etc.): BP soft but stable with progression to upright and with amb. 90's/50's to 100's/60's    Exercises     Assessment/Plan    PT Assessment Patient needs continued PT services  PT Problem List Decreased strength;Decreased balance;Decreased activity tolerance;Decreased mobility       PT Treatment Interventions DME instruction;Gait training;Stair training;Functional mobility training;Therapeutic activities;Therapeutic exercise;Balance training;Patient/family education    PT Goals (Current goals can be found in the Care Plan section)  Acute Rehab PT Goals Patient Stated Goal: Return to prior level of function PT Goal Formulation: With patient/family Time For Goal Achievement: 02/13/25 Potential to Achieve Goals: Good    Frequency Min 2X/week     Co-evaluation PT/OT/SLP Co-Evaluation/Treatment: Yes Reason for Co-Treatment: For patient/therapist safety;To address functional/ADL transfers PT goals addressed during session: Mobility/safety with mobility;Balance         AM-PAC PT 6 Clicks Mobility  Outcome Measure Help needed turning from your back to your side while in a flat bed without using bedrails?: None Help needed moving from lying on your back to sitting on the side of a flat bed without using bedrails?: A Little Help needed moving to and from a bed to a chair (including a wheelchair)?: A Little Help needed standing up from a chair using your arms (e.g., wheelchair or bedside chair)?: A Little Help needed to walk in hospital room?: A Little Help needed climbing 3-5 steps with a  railing? : A Little 6 Click Score: 19    End of Session Equipment Utilized During Treatment: Gait belt Activity Tolerance: Patient tolerated treatment well Patient left: in chair;with call bell/phone within reach;with chair alarm set Nurse Communication: Mobility status PT Visit Diagnosis: Other abnormalities of gait and mobility (R26.89);Unsteadiness on feet (R26.81);Muscle weakness (generalized) (M62.81)    Time: 8567-8490 PT Time Calculation (min) (ACUTE ONLY): 37 min   Charges:   PT Evaluation $PT Eval Moderate Complexity: 1 Mod   PT General Charges $$ ACUTE PT VISIT: 1 Visit         [facility] PT Acute Rehabilitation Services Office [phone]   [facility] ORN [facility] 01/30/2025, 4:07 PM

## 2025-01-30 NOTE — Progress Notes (Addendum)
 "    Advanced Heart Failure Rounding Note  Cardiologist: Gordy Bergamo, MD  [facility] Cardiologist: Dr. Cherrie  Chief Complaint: Cardiogenic shock  Patient Profile   [name] is a 74 y.o. female with history of MS and prior Takotsubo cardiomyopathy vs myocarditis in 2020 at [facility].  Significant events:   2/2: ACS with marked anterior STE. Cath w/ minimal CAD, LVEF 20% with apical ballooning. Moved to ICU with PA catheter and cardiogenic shock. Started milrinone  + Norepi.  Subjective:    Continues on 0.375 milrinone  + 4 Norepi.  Swan #s CVP 5 PA 19/15 CO 3.7 CI 2.1 Co-ox 68%.  MAP 70s but systolic pressure remains soft in the 90s.  No shortness of breath or chest pain. No complaints other than discomfort from PA catheter. Wants to get out of bed.   Objective:   Weight Range: 62 kg Body mass index is 19.61 kg/m.   Vital Signs:   Temp:  [98.4 F (36.9 C)-100.6 F (38.1 C)] 99.1 F (37.3 C) (02/04 0900) Pulse Rate:  [84-116] 97 (02/04 0900) Resp:  [11-27] 20 (02/04 0900) BP: (80-122)/(53-96) 101/65 (02/04 0900) SpO2:  [81 %-99 %] 98 % (02/04 0900) Last BM Date :  (PTA)  Weight change: Filed Weights   01/28/25 1900 01/29/25 0500  Weight: 59.5 kg 62 kg    Intake/Output:   Intake/Output Summary (Last 24 hours) at 01/30/2025 0936 Last data filed at 01/30/2025 0900 Gross per 24 hour  Intake 688.58 ml  Output 900 ml  Net -211.42 ml     Physical Exam   General:  Sitting up in bed. NO distress.  Neck: R internal jugular PA catheter Cor: Regular rate & rhythm. No murmurs. Lungs: clear Abdomen: soft, nontender, nondistended. Extremities: no edema Neuro: alert & orientedx3. Affect pleasant   Telemetry   Sinus tach 100s  Labs   CBC Recent Labs    01/29/25 0425 01/30/25 0519  WBC 12.6* 11.6*  HGB 13.6 12.7  HCT 40.8 38.5  MCV 93.2 94.1  PLT 218 159   Basic Metabolic Panel Recent Labs    97/96/73 0425 01/30/25 0519  NA 138 138  K 3.8 4.3   CL 100 103  CO2 27 29  GLUCOSE 216* 132*  BUN 19 18  CREATININE 0.87 0.81  CALCIUM  8.7* 8.2*  MG 1.7 2.3   Liver Function Tests No results for input(s): AST, ALT, ALKPHOS, BILITOT, PROT, ALBUMIN in the last 72 hours. No results for input(s): LIPASE, AMYLASE in the last 72 hours. Cardiac Enzymes No results for input(s): CKTOTAL, CKMB, CKMBINDEX, TROPONINI in the last 72 hours.  BNP: BNP (last 3 results) No results for input(s): BNP in the last 8760 hours.  ProBNP (last 3 results) No results for input(s): PROBNP in the last 8760 hours.   D-Dimer No results for input(s): DDIMER in the last 72 hours. Hemoglobin A1C Recent Labs    01/28/25 1635  HGBA1C 5.4   Fasting Lipid Panel Recent Labs    01/28/25 1635  CHOL 174  HDL 84  LDLCALC 63  TRIG 137  CHOLHDL 2.1   Medications:   Scheduled Medications:  aspirin  EC  81 mg Oral Daily   Chlorhexidine  Gluconate Cloth  6 each Topical Daily   digoxin   0.125 mg Oral Daily   donepezil   10 mg Oral QHS   enoxaparin  (LOVENOX ) injection  60 mg Subcutaneous BID   ezetimibe   10 mg Oral Daily   insulin  aspart  0-5 Units Subcutaneous QHS  insulin  aspart  0-9 Units Subcutaneous TID WC   pantoprazole   40 mg Oral Daily   rOPINIRole   0.25 mg Oral QHS   rosuvastatin   20 mg Oral QHS   sodium chloride  flush  10-40 mL Intracatheter Q12H   sodium chloride  flush  3 mL Intravenous Q12H    Infusions:  milrinone  0.25 mcg/kg/min (01/30/25 0900)   norepinephrine  (LEVOPHED ) Adult infusion 4 mcg/min (01/30/25 0900)    PRN Medications: acetaminophen , ondansetron  (ZOFRAN ) IV, mouth rinse, sodium chloride  flush, sodium chloride  flush  Assessment/Plan   Cardiogenic shock - In setting of recurrent Takotsubo CM. Hx prior Takotsubo vs myocarditis in 2020. EF was 65% on echo in 2022.  - Significant anterior STE on presenting ECG. R/LHC w/ minimal CAD, LVEF 20% and apical ballooning. RA 5, PA 28/21 (25), PCWP 25,  LVEDP 29, Fick CO/CI 2.7/1.57 - Lactic acid peaked 2.3>>cleared - Echo EF 25-30%, WMA c/w takotsubo cardiomyopathy, dynamic outflow tract obstruction w/ SAM and peak gradient of 40 mmHg with valsava, RV okay - Currently on 0.375 milrinone  + 4 NE. Co-ox 68% and TD CI is 2.1. Wean milrinone  to 0.25. If co-ox stable this afternoon will wean further. - Continue digoxin  - No BP room for GDMT - Will eventually need cMRI, needs to have Swan out - Transition lovenox  to eliquis , continue anticoagulation until LV function improves  MS - Continue home meds   Remove Swan. Can keep introducer until PICC placed.  Mobilize today Placed PT/OT consults. Reports she had been struggling with more weakness prior to hospitalization.  CRITICAL CARE Performed by: COLLETTA SHAVER N   Total critical care time: 10 minutes  Critical care time was exclusive of separately billable procedures and treating other patients.  Critical care was necessary to treat or prevent imminent or life-threatening deterioration.  Critical care was time spent personally by me on the following activities: development of treatment plan with patient and/or surrogate as well as nursing, discussions with consultants, evaluation of patient's response to treatment, examination of patient, obtaining history from patient or surrogate, ordering and performing treatments and interventions, ordering and review of laboratory studies, ordering and review of radiographic studies, pulse oximetry and re-evaluation of patient's condition.   Length of Stay: 2  FINCH, SHAVER SAILOR, PA-C  01/30/2025, 9:36 AM  Advanced Heart Failure Team Pager [phone] (M-F; 7a - 5p)   Please visit [email or web address]: For overnight coverage please call cardiology fellow first. If fellow not available call Shock/ECMO MD on call.  For ECMO / Mechanical Support (Impella, IABP, LVAD) issues call Shock / ECMO MD on call.    Patient seen and examined with the above-signed Advanced  Practice Provider and/or Housestaff. I personally reviewed laboratory data, imaging studies and relevant notes. I independently examined the patient and formulated the important aspects of the plan. I have edited the note to reflect any of my changes or salient points. I have personally discussed the plan with the patient and/or family.  Feeling better. Co-ox looks good on milrinone  0.375, CVP low  I did POCUS Echo EF ~30% with persistent apical ballooning. EF perhaps some better. Still with narrow LVOT  General:  Sitting up in bed. No resp difficulty HEENT: normal Neck: supple. no JVD.  Cor: Regular rate & rhythm. 2/6 SEM LSB Lungs: clear Abdomen: soft, nontender, nondistended.Good bowel sounds. Extremities: no cyanosis, clubbing, rash, edema Neuro: alert & orientedx3, cranial nerves grossly intact. moves all 4 extremities w/o difficulty. Affect pleasant  Improving slowly. Will wean milrinone . Remove swan. Place picc  to follow co-ox. Keep in icu today start GDMT as tolerated. Avoid over diuresis  Toribio Fuel, MD  2:31 PM   "

## 2025-01-30 NOTE — Progress Notes (Signed)
 Occupational Therapy Treatment Patient Details Name: [name] MRN: [id number] DOB: [birth year] Today's Date: 01/30/2025   History of present illness Pt is 74 year old presented to [facility] on  01/28/25 for chest pain. Pt with cardiogenic shock in the setting of recurrent Takotsubo cardiomyopathy.  PMH - rt TSA (October 2025), ACDF, asthma, migraines, Takotsubo cardiomyopathy, heart murmur, hiatal hernia, MS, myocarditis, occipital neuralgia, transverse myelitis   OT comments  PTA, pt lived with spouse and was mod I until recently when she began experiencing fatigue daily limiting her to basic mobility in the home and decreased engagement in daily activity. Upon eval, pt mobilizing and performing BADL with up to min A this session. Pt very motivated and suspect she will progress well to return home with spouse and no follow up OT. Will follow acutely.       If plan is discharge home, recommend the following:  A little help with walking and/or transfers;A little help with bathing/dressing/bathroom;Assistance with cooking/housework;Assist for transportation;Help with stairs or ramp for entrance   Equipment Recommendations  None recommended by OT    Recommendations for Other Services      Precautions / Restrictions Precautions Precautions: Fall;Other (comment) (watch BP  ) Recall of Precautions/Restrictions: Intact Precaution/Restrictions Comments: watch BP Restrictions Weight Bearing Restrictions Per Provider Order: No       Mobility Bed Mobility Overal bed mobility: Needs Assistance Bed Mobility: Supine to Sit     Supine to sit: Contact guard          Transfers Overall transfer level: Needs assistance Equipment used: Rollator (4 wheels) Transfers: Sit to/from Stand Sit to Stand: Min assist, +2 safety/equipment           General transfer comment: Assist to power up     Balance Overall balance assessment: Needs assistance Sitting-balance support: No upper extremity  supported, Feet supported Sitting balance-Leahy Scale: Good     Standing balance support: No upper extremity supported, During functional activity Standing balance-Leahy Scale: Fair                             ADL either performed or assessed with clinical judgement   ADL Overall ADL's : Needs assistance/impaired Eating/Feeding: Modified independent;Sitting   Grooming: Contact guard assist;Standing   Upper Body Bathing: Set up;Sitting   Lower Body Bathing: Sit to/from stand;Minimal assistance   Upper Body Dressing : Set up;Sitting   Lower Body Dressing: Contact guard assist;Sitting/lateral leans   Toilet Transfer: Ambulation;Rollator (4 wheels);Minimal assistance;+2 for physical assistance;+2 for safety/equipment   Toileting- Clothing Manipulation and Hygiene: Sit to/from stand;Minimal assistance       Functional mobility during ADLs: Contact guard assist;+2 for safety/equipment;Rollator (4 wheels)      Extremity/Trunk Assessment Upper Extremity Assessment Upper Extremity Assessment: Generalized weakness   Lower Extremity Assessment Lower Extremity Assessment: Defer to PT evaluation   Cervical / Trunk Assessment Cervical / Trunk Assessment: Normal    Vision Patient Visual Report: No change from baseline Vision Assessment?: No apparent visual deficits   Perception     Praxis     Communication Communication Communication: No apparent difficulties (not formally assessed; hx of MS.)   Cognition Arousal: Alert Behavior During Therapy: WFL for tasks assessed/performed Cognition: No apparent impairments                               Following commands: Intact  Cueing   Cueing Techniques: Verbal cues  Exercises      Shoulder Instructions       General Comments BP soft but stable with progression to upright and with amb. 90's/50's to 100's/60's    Pertinent Vitals/ Pain       Pain Assessment Pain Assessment: Faces Faces  Pain Scale: Hurts little more Pain Location: knee Pain Descriptors / Indicators: Sore Pain Intervention(s): Monitored during session  Home Living Family/patient expects to be discharged to:: Private residence Living Arrangements: Spouse/significant other Available Help at Discharge: Family;Available 24 hours/day Type of Home: House (town home?  ) Home Access: Level entry     Home Layout: One level     Bathroom Shower/Tub: Producer, Television/film/video: Handicapped height Bathroom Accessibility: Yes   Home Equipment: Hand held shower head;Shower seat - built in;Grab bars - tub/shower          Prior Functioning/Environment              Frequency  Min 2X/week        Progress Toward Goals  OT Goals(current goals can now be found in the care plan section)     Acute Rehab OT Goals Patient Stated Goal: get better OT Goal Formulation: With patient Time For Goal Achievement: 02/13/25 Potential to Achieve Goals: Good  Plan      Co-evaluation      Reason for Co-Treatment: For patient/therapist safety;To address functional/ADL transfers PT goals addressed during session: Mobility/safety with mobility;Balance        AM-PAC OT 6 Clicks Daily Activity     Outcome Measure   Help from another person eating meals?: None Help from another person taking care of personal grooming?: A Little Help from another person toileting, which includes using toliet, bedpan, or urinal?: A Little Help from another person bathing (including washing, rinsing, drying)?: A Little Help from another person to put on and taking off regular upper body clothing?: A Little Help from another person to put on and taking off regular lower body clothing?: A Little 6 Click Score: 19    End of Session Equipment Utilized During Treatment: Rollator (4 wheels);Gait belt  OT Visit Diagnosis: Unsteadiness on feet (R26.81);Muscle weakness (generalized) (M62.81);Other (comment) (decr activity  tolerance)   Activity Tolerance Patient tolerated treatment well   Patient Left in chair;with call bell/phone within reach;with chair alarm set;with family/visitor present   Nurse Communication Mobility status        Time: 1450-1511 OT Time Calculation (min): 21 min  Charges: OT General Charges $OT Visit: 1 Visit OT Evaluation $OT Eval Moderate Complexity: 1 Mod  Elma JONETTA Lebron FREDERICK, OTR/L [facility] Acute Rehabilitation Office: [phone]   Elma JONETTA Lebron 01/30/2025, 5:31 PM

## 2025-01-30 NOTE — Progress Notes (Signed)
 Peripherally Inserted Central Catheter Placement  The IV Nurse has discussed with the patient and/or persons authorized to consent for the patient, the purpose of this procedure and the potential benefits and risks involved with this procedure.  The benefits include less needle sticks, lab draws from the catheter, and the patient may be discharged home with the catheter. Risks include, but not limited to, infection, bleeding, blood clot (thrombus formation), and puncture of an artery; nerve damage and irregular heartbeat and possibility to perform a PICC exchange if needed/ordered by physician.  Alternatives to this procedure were also discussed.  Bard Power PICC patient education guide, fact sheet on infection prevention and patient information card has been provided to patient /or left at bedside.    PICC Placement Documentation  PICC Triple Lumen 01/30/25 Right Basilic 40 cm 0 cm (Active)  Indication for Insertion or Continuance of Line Vasoactive infusions 01/30/25 2046  Exposed Catheter (cm) 0 cm 01/30/25 2046  Site Assessment Clean, Dry, Intact 01/30/25 2046  Lumen #1 Status Flushed;Saline locked;Blood return noted 01/30/25 2046  Lumen #2 Status Flushed;Saline locked;Blood return noted 01/30/25 2046  Lumen #3 Status Flushed;Saline locked;Blood return noted 01/30/25 2046  Dressing Type Transparent;Securing device 01/30/25 2046  Dressing Status Antimicrobial disc/dressing in place;Clean, Dry, Intact 01/30/25 2046  Line Care Connections checked and tightened 01/30/25 2046  Line Adjustment (NICU/IV Team Only) No 01/30/25 2046  Dressing Intervention New dressing;Adhesive placed at insertion site (IV team only) 01/30/25 2046  Dressing Change Due 02/06/25 01/30/25 2046       Elayah Klooster, Cherene Place 01/30/2025, 8:47 PM

## 2025-01-30 NOTE — Discharge Instructions (Addendum)
 Information on my medicine - ELIQUIS  (apixaban )  This medication education was reviewed with me or my healthcare representative as part of my discharge preparation.  The pharmacist that spoke with me during my hospital stay was:  Elnor Renovato, Harlene BROCKS, [facility]  Why was Eliquis  prescribed for you? Eliquis  was prescribed for you to reduce the risk of a blood clot forming that can cause a stroke if you have a medical condition called atrial fibrillation (a type of irregular heartbeat).  What do You need to know about Eliquis  ? Take your Eliquis  TWICE DAILY - one tablet in the morning and one tablet in the evening with or without food. If you have difficulty swallowing the tablet whole please discuss with your pharmacist how to take the medication safely.  Take Eliquis  exactly as prescribed by your doctor and DO NOT stop taking Eliquis  without talking to the doctor who prescribed the medication.  Stopping may increase your risk of developing a stroke.  Refill your prescription before you run out.  After discharge, you should have regular check-up appointments with your healthcare provider that is prescribing your Eliquis .  In the future your dose may need to be changed if your kidney function or weight changes by a significant amount or as you get older.  What do you do if you miss a dose? If you miss a dose, take it as soon as you remember on the same day and resume taking twice daily.  Do not take more than one dose of ELIQUIS  at the same time to make up a missed dose.  Important Safety Information A possible side effect of Eliquis  is bleeding. You should call your healthcare provider right away if you experience any of the following: Bleeding from an injury or your nose that does not stop. Unusual colored urine (red or dark brown) or unusual colored stools (red or black). Unusual bruising for unknown reasons. A serious fall or if you hit your head (even if there is no bleeding).  Some  medicines may interact with Eliquis  and might increase your risk of bleeding or clotting while on Eliquis . To help avoid this, consult your healthcare provider or pharmacist prior to using any new prescription or non-prescription medications, including herbals, vitamins, non-steroidal anti-inflammatory drugs (NSAIDs) and supplements.  This website has more information on Eliquis  (apixaban ): http://[email or web address] .com/eliquis /home     High-Calorie, High-Protein Nutrition Therapy (2021) A high-calorie, high-protein diet has been recommended to you. Your registered dietitian nutritionist (RDN) may have recommended this diet because you are having difficulty eating enough calories throughout the day, you have lost weight, and/or you need to add protein to your diet. Sometimes you may not feel like eating, even if you know the importance of good nutrition. The recommendations in this handout can help you with the following: Regaining your strength and energy Keeping your body healthy Healing and recovering from surgery or illness and fighting infection Tips: Schedule Your Meals and Snacks Several small meals and snacks are often better tolerated and digested than large meals. Strategies Plan to eat 3 meals and 3 snacks daily. Experiment with timing meals to find out when you have a larger appetite. Appetite may be greatest in the morning after not eating all night so you may prefer to eat your larger meals and snacks in the morning and at lunch. Breakfast-type foods are often better tolerated so eat foods such as eggs, pancakes, waffles and cereal for any meal or snack. Carry snacks with you so you are prepared to  eat every 2 to 3 hours. Determine what works best for you if your bodys cues for feeling hungry or full are not working. Eat a small meal or snack even if you dont feel hungry. Set a timer to remind you when it is time to eat. Take a walk before you eat (with health care providers  approval). Light or moderate physical activity can help you maintain muscle and increase your appetite. Make Eating Enjoyable Taking steps to make the experience enjoyable may help to increase your interest in eating and improve your appetite. Strategies: Eat with others whenever possible. Include your favorite foods to make meals more enjoyable. Try new foods. Save your beverage for the end of the meal so that you have more room for food before you get full. Add Calories to Your Meals and Snacks Try adding calorie-dense foods so that each bite provides more nutrition. Strategies Drink milk, chocolate milk, soy milk, or smoothies instead of low-calorie beverages such as diet drinks or water. Cook with milk or soy milk instead of water when making dishes such as hot cereal, cocoa, or pudding. Add jelly, jam, honey, butter or margarine to bread and crackers. Add jam or fruit to ice cream and as a topping over cake. Mix dried fruit, nuts, granola, honey, or dry cereal with yogurt or hot cereals. Enjoy snacks such as milkshakes, smoothies, pudding, ice cream, or custard. Blend a fruit smoothie of a banana, frozen berries, milk or soy milk, and 1 tablespoon nonfat powdered milk or protein powder. Add Protein to Your Meals and Snacks Choose at least one protein food at each meal and snack to increase your daily intake. Strategies Add  cup nonfat dry milk powder or protein powder to make a high-protein milk to drink or to use in recipes that call for milk. Vanilla or peppermint extract or unsweetened cocoa powder could help to boost the flavor. Add hard-cooked eggs, leftover meat, grated cheese, canned beans or tofu to noodles, rice, salads, sandwiches, soups, casseroles, pasta, tuna and other mixed dishes. Add powdered milk or protein powder to hot cereals, meatloaf, casseroles, scrambled eggs, sauces, cream soups, and shakes. Add beans and lentils to salads, soups, casseroles, and vegetable  dishes. Eat cottage cheese or yogurt, especially Greek yogurt, with fruit as a snack or dessert. Eat peanut or other nut butters on crackers, bread, toast, waffles, apples, bananas or celery sticks. Add it to milkshakes, smoothies, or desserts. Consider a ready-made protein shake. Your RDN will make recommendations. Add Fats to Your Meals and Snacks Try adding fats to your meals and snacks. Fat provides more calories in fewer bites than carbohydrate or protein and adds flavors to your foods. Strategies Snack on nuts and seeds or add them to foods like salads, pasta, cereals, yogurt, and ice cream.  Saut or stir-fry vegetables, meats, chicken, fish or tofu in olive or canola oil.  Add olive oil, other vegetable oils, butter or margarine to soups, vegetables, potatoes, cooked cereal, rice, pasta, bread, crackers, pancakes, or waffles. Snack on olives or add to pasta, pizza, or salad. Add avocado or guacamole to your salads, sandwiches, and other entrees. Include fatty fish such as salmon in your weekly meal plan. For general food safety tips, especially for clients with immunocompromised conditions, ask your RDN for the Food Safety Nutrition Therapy handout. Small Meal and Snack Ideas These snacks and meals are recommended when you have to eat but arent necessarily hungry.  They are good choices because they are high in  protein and high in calories.  2 graham crackers 2 tablespoons peanut or other nut butter 1 cup milk 2 slices whole wheat toast topped with:  avocado, mashed Seasoning of your choice   cup Greek yogurt  cup fruit  cup granola 2 deviled egg halves 5 whole wheat crackers  1 cup cream of tomato soup  grilled cheese sandwich 1 toasted waffle topped with: 2 tablespoons peanut or nut butter 1 tablespoon jam  Trail mix made with:  cup nuts  cup dried fruit  cup cold cereal, any variety  cup oatmeal or cream of wheat cereal 1 tablespoon peanut or nut butter  cup  diced fruit   High-Calorie, High-Protein Sample 1-Day Menu View Nutrient Info Breakfast 1 egg, scrambled 1 ounce cheddar cheese 1 English muffin, whole wheat 1 tablespoon margarine 1 tablespoon jam  cup orange juice, fortified with calcium  and vitamin D   Morning Snack 1 tablespoon peanut butter 1 banana 1 cup 1% milk  Lunch Tuna salad sandwich made with: 2 slices bread, whole wheat 3 ounces tuna mixed with: 1 tablespoon mayonnaise  cup pudding  Afternoon Snack  cup hummus  cup carrots 1 pita  Evening Meal Enchilada casserole made with: 2 corn tortillas 3 ounces ground beef, cooked  cup black beans, cooked  cup corn, cooked 1 ounce grated cheddar cheese  cup enchilada sauce  avocado, sliced, topping for enchilada 1 tablespoon sour cream, topping for enchilada Salad:  cup lettuce, shredded  cup tomatoes, chopped, for salad 1 tablespoon olive oil and vinegar dressing, for salad  Evening Snack  cup Greek yogurt  cup blueberries  cup granola

## 2025-01-30 NOTE — Telephone Encounter (Signed)
 Patient Product/process Development Scientist completed.    The patient is insured through [employer]/ [employer]. Patient has Medicare and is not eligible for a copay card, but may be able to apply for patient assistance or Medicare RX Payment Plan (Patient Must reach out to their plan, if eligible for payment plan), if available.    Ran test claim for Eliquis  5 mg and the current 30 day co-pay is $49.79.   This test claim was processed through [facility]- copay amounts may vary at other pharmacies due to pharmacy/plan contracts, or as the patient moves through the different stages of their insurance plan.     Reyes Sharps, CPHT Pharmacy Technician Patient Advocate Specialist Lead [facility] Patient Advocate Team Direct Number: [phone]  Fax: [phone]

## 2025-01-30 NOTE — Progress Notes (Signed)
 Initial Nutrition Assessment  DOCUMENTATION CODES:   Severe malnutrition in context of chronic illness  INTERVENTION:  Liberalized diet to regular to provide increased options and promote adequate intake  Added Ensure Plus High Protein po BID, each supplement provides 350 kcal and 20 grams of protein.  Added Magic cup TID with meals, each supplement provides 290 kcal and 9 grams of protein  NUTRITION DIAGNOSIS:   Severe Malnutrition related to chronic illness as evidenced by severe fat depletion, severe muscle depletion.  GOAL:   Patient will meet greater than or equal to 90% of their needs   MONITOR:   PO intake, Supplement acceptance  REASON FOR ASSESSMENT:   Consult Assessment of nutrition requirement/status  ASSESSMENT:   Pt with hx of MS, Sjogren's disease, osteoporosis, and prior Takotsubo cardiomyopathy. Admitted with mild chest discomfort, diagnosed Takotsubo cardiomyopathy.  2/2 admitted; emergent cath lab, swan placed  Met with pt and husband at bedside. Pt reports feeling better and had lunch tray in front of her at time of assessment. Pt reports she feels like she has a good appetite for the first time since admission. No GI discomforts to report at this time, will continue to monitor for bowel movement.   PTA, pt reports she would eat 2-3 x per day. Husband is the main cook in the house but pt still prepares some food. 24 h recall:  B: something light, cereal w/ milk L: chicken w/ vegetables or soup D: Chicken w/ vegetables Husband reports they tend not to eat red meat due to the price of red meat. Pt reports eating vegetables and fruit. Pt endorses drinking 1-2 Ensure shakes daily as well.   Pt was not using any mobility assistive devices PTA and was fairly independent.   Pt endorses some muscle loss since rotator cuff surgery a few years ago but unsure how much weight overall she feels like she has lost. Nutrition focused physical exam shows severe  muscle depletions and severe fat depletions, indicative of malnutrition. Suspect malnutrition related to pt's increased calorie and protein needs associated with chronic illness and suspect issue has been ongoing for some time.   Encouraged continued use of Ensure supplements and added magic cups to trays. Recommend liberalized diet to provide as many options to pt as possible and promote adequate PO intake while hospitalized.   Pt would benefit from high calorie, high protein nutrition therapy education prior to discharge, added information to AVS.  Admit weight: 59.5 kg  Current weight: 62 kg  Drains/Lines: Swan R internal jugular Sheath R internal jugular  External Urinary Cath   Nutritionally Relevant Medications: Scheduled Meds:  feeding supplement  237 mL Oral BID BM   insulin  aspart  0-5 Units Subcutaneous QHS   insulin  aspart  0-9 Units Subcutaneous TID WC   pantoprazole   40 mg Oral Daily   rOPINIRole   0.25 mg Oral QHS   rosuvastatin   20 mg Oral QHS   Continuous Infusions:  milrinone  0.25 mcg/kg/min (01/30/25 1200)   norepinephrine  (LEVOPHED ) Adult infusion 4 mcg/min (01/30/25 1200)   PRN Meds:.acetaminophen , ondansetron  (ZOFRAN ) IV, mouth rinse, sodium chloride  flush, sodium chloride  flush  Labs Reviewed: CBG ranges from 101-125 mg/dL over the last 24 hours HgbA1c 5.4%  NUTRITION - FOCUSED PHYSICAL EXAM:  Flowsheet Row Most Recent Value  Orbital Region Severe depletion  Upper Arm Region Moderate depletion  Thoracic and Lumbar Region Severe depletion  Buccal Region Moderate depletion  Temple Region Severe depletion  Clavicle Bone Region Severe depletion  Clavicle  and Acromion Bone Region Severe depletion  Scapular Bone Region Severe depletion  Dorsal Hand Severe depletion  Patellar Region Unable to assess  Anterior Thigh Region Unable to assess  Posterior Calf Region Unable to assess  Edema (RD Assessment) Mild  [BLE]  Hair Reviewed  Eyes Reviewed  Mouth  Reviewed  Skin Reviewed  Nails Reviewed    Diet Order:   Diet Order             Diet regular Room service appropriate? Yes; Fluid consistency: Thin  Diet effective now                   EDUCATION NEEDS:   Education needs have been addressed  Skin:  Skin Assessment: Reviewed RN Assessment  Last BM:  PTA  Height:   Ht Readings from Last 1 Encounters:  12/31/24 5' 10 (1.778 m)    Weight:   Wt Readings from Last 1 Encounters:  01/29/25 62 kg    Ideal Body Weight:  68.2 kg  BMI:  Body mass index is 19.61 kg/m.  Estimated Nutritional Needs:   Kcal:  1600-1800  Protein:  65-80g  Fluid:  1.6-1.8L    Josette Glance, MS, RDN, LDN Clinical Dietitian I Please reach out via secure chat

## 2025-01-31 ENCOUNTER — Inpatient Hospital Stay (HOSPITAL_COMMUNITY)

## 2025-01-31 LAB — BASIC METABOLIC PANEL WITH GFR
Anion gap: 7 (ref 5–15)
BUN: 15 mg/dL (ref 8–23)
CO2: 27 mmol/L (ref 22–32)
Calcium: 8.6 mg/dL — ABNORMAL LOW (ref 8.9–10.3)
Chloride: 103 mmol/L (ref 98–111)
Creatinine, Ser: 0.67 mg/dL (ref 0.44–1.00)
GFR, Estimated: >60 mL/min
Glucose, Bld: 147 mg/dL — ABNORMAL HIGH (ref 70–99)
Potassium: 4.2 mmol/L (ref 3.5–5.1)
Sodium: 138 mmol/L (ref 135–145)

## 2025-01-31 LAB — GLUCOSE, CAPILLARY
Glucose-Capillary: 123 mg/dL — ABNORMAL HIGH (ref 70–99)
Glucose-Capillary: 166 mg/dL — ABNORMAL HIGH (ref 70–99)
Glucose-Capillary: 97 mg/dL (ref 70–99)
Glucose-Capillary: 119 mg/dL — ABNORMAL HIGH (ref 70–99)

## 2025-01-31 LAB — LIPOPROTEIN A (LPA): Lipoprotein (a): 211.4 nmol/L — ABNORMAL HIGH

## 2025-01-31 LAB — COOXEMETRY PANEL
Carboxyhemoglobin: 1 % (ref 0.5–1.5)
Methemoglobin: 1.8 % — ABNORMAL HIGH (ref 0.0–1.5)
O2 Saturation: 66.5 %
Total hemoglobin: 13.6 g/dL (ref 12.0–16.0)

## 2025-01-31 LAB — MAGNESIUM: Magnesium: 2.1 mg/dL (ref 1.7–2.4)

## 2025-01-31 MED ORDER — GADOBUTROL 1 MMOL/ML IV SOLN
8.5000 mL | Freq: Once | INTRAVENOUS | Status: AC | PRN
  Administered 2025-01-31: 8.5 mL via INTRAVENOUS

## 2025-01-31 MED ORDER — BACLOFEN 10 MG PO TABS
10.0000 mg | ORAL_TABLET | Freq: Every day | ORAL | Status: AC
  Administered 2025-01-31 – 2025-02-01 (×2): 10 mg via ORAL
  Filled 2025-01-31 (×2): qty 1

## 2025-01-31 NOTE — Progress Notes (Signed)
 Physical Therapy Treatment Patient Details Name: [name] MRN: [id number] DOB: February 02, 1951 Today's Date: 01/31/2025   History of Present Illness Pt is 74 year old presented [facility] on  01/28/25 for chest pain. Pt with cardiogenic shock in the setting of recurrent Takotsubo cardiomyopathy.  PMH - rt TSA (October 2025), ACDF, asthma, migraines, Takotsubo cardiomyopathy, heart murmur, hiatal hernia, MS, myocarditis, occipital neuralgia, transverse myelitis    PT Comments  This session was limited due to transport arriving to take her to an MRI. The goal of this session was to simulate ambulation without an AD because she was walking without an AD prior to admission. Currently, pt is CGA for supine to sit and min assist for sit to supine. Pt was able to ambulate 51ft CGA to min A without AD but displays shuffled, incoordination, fatigue, muscle weakness and need for support when ambulating. Pt still continues to be limited by activity tolerance, endurance, mobility, balance, strength. Pt would likely benefit from outpatient PT to d/c home due to recent fatigue, weakness and pt was active without AD prior to admission. Will continue to follow acutely.   If plan is discharge home, recommend the following: Assistance with cooking/housework;Assist for transportation;A little help with walking and/or transfers;A little help with bathing/dressing/bathroom   Can travel by private Psychologist, Clinical (4 wheels)    Recommendations for Other Services       Precautions / Restrictions Precautions Precautions: Fall;Other (comment) Recall of Precautions/Restrictions: Intact Precaution/Restrictions Comments: watch BP Restrictions Weight Bearing Restrictions Per Provider Order: No     Mobility  Bed Mobility Overal bed mobility: Needs Assistance Bed Mobility: Supine to Sit, Sit to Supine     Supine to sit: Contact guard (HOB elevated 8 degrees) Sit to supine: Min  assist, HOB elevated (HOB elevated 8 degrees)   General bed mobility comments: Pt was CGA for supine to sit. Pt was min A for sit to supine needing assistance with bringing the R lower extremity onto the bed.    Transfers Overall transfer level: Needs assistance Equipment used: None Transfers: Sit to/from Stand Sit to Stand: Min assist           General transfer comment: Pt was min A needing tactile cues around the back to assist to power up.    Ambulation/Gait Ambulation/Gait assistance: Contact guard assist, Min assist Gait Distance (Feet): 30 Feet (2 bouts of 53ft each) Assistive device: None Gait Pattern/deviations: Step-to pattern, Trunk flexed, Narrow base of support, Shuffle, Decreased step length - right, Decreased step length - left, Decreased stride length Gait velocity: Reduced Gait velocity interpretation: <1.31 ft/sec, indicative of household ambulator   General Gait Details: Pt was CGA without AD but pt was very unsteady and tended to shuffle feet when walking. Pt displayed a narrow base of support, decreased stride length and bilateral decreased step length. Pt had increased difficulty going around obstacles and at times intermittently min A needing to hold IV pole for support.   Stairs             Wheelchair Mobility     Tilt Bed    Modified Rankin (Stroke Patients Only)       Balance Overall balance assessment: Needs assistance Sitting-balance support: No upper extremity supported, Feet supported Sitting balance-Leahy Scale: Good Sitting balance - Comments: sititng at EOB and on toilet without loss of balance. Pt able to reach off center of gravity to perform pericare.  Standing balance support: No upper extremity supported, During functional activity, Reliant on assistive device for balance Standing balance-Leahy Scale: Poor Standing balance comment: Pt displays increased difficulty when standing needing intermittent IV pole for balance for  support.                            Communication Communication Communication: No apparent difficulties  Cognition Arousal: Alert Behavior During Therapy: WFL for tasks assessed/performed   PT - Cognitive impairments: No apparent impairments                       PT - Cognition Comments: WFL Following commands: Intact      Cueing Cueing Techniques: Verbal cues  Exercises      General Comments General comments (skin integrity, edema, etc.): VSS on RA      Pertinent Vitals/Pain Pain Assessment Pain Assessment: Faces Faces Pain Scale: Hurts little more Pain Location: R shoulder (previous joint replacement surgery) Pain Descriptors / Indicators: Discomfort, Sore Pain Intervention(s): Limited activity within patient's tolerance, Monitored during session    Home Living                          Prior Function            PT Goals (current goals can now be found in the care plan section) Acute Rehab PT Goals Patient Stated Goal: to go home PT Goal Formulation: With patient Time For Goal Achievement: 02/13/25 Potential to Achieve Goals: Good Progress towards PT goals: Progressing toward goals    Frequency    Min 2X/week      PT Plan      Co-evaluation              AM-PAC PT 6 Clicks Mobility   Outcome Measure  Help needed turning from your back to your side while in a flat bed without using bedrails?: A Little Help needed moving from lying on your back to sitting on the side of a flat bed without using bedrails?: A Little Help needed moving to and from a bed to a chair (including a wheelchair)?: A Little Help needed standing up from a chair using your arms (e.g., wheelchair or bedside chair)?: A Little Help needed to walk in hospital room?: A Little Help needed climbing 3-5 steps with a railing? : A Little 6 Click Score: 18    End of Session   Activity Tolerance: Patient tolerated treatment well Patient left: in  bed;Other (comment);with nursing/sitter in room (Transport came for MRI) Nurse Communication: Mobility status PT Visit Diagnosis: Other abnormalities of gait and mobility (R26.89);Unsteadiness on feet (R26.81);Muscle weakness (generalized) (M62.81);Pain;Difficulty in walking, not elsewhere classified (R26.2) Pain - Right/Left: Right Pain - part of body: Shoulder     Time: 8371-8352 PT Time Calculation (min) (ACUTE ONLY): 19 min  Charges:    $Therapeutic Activity: 8-22 mins PT General Charges $$ ACUTE PT VISIT: 1 Visit                     [name], [name]    Hidaya Daniel 01/31/2025, 6:04 PM

## 2025-01-31 NOTE — Progress Notes (Signed)
 Monitored transport to MRI.

## 2025-01-31 NOTE — TOC Progression Note (Signed)
 Transition of Care [facility]) - Progression Note    Patient Details  Name: [name] MRN: [id number] Date of Birth: July 23, 1951  [facility] CM/SW Contact  Arlana JINNY Nicholaus [place] Phone Number: [phone] 01/31/2025, 1:21 PM  Clinical Narrative:   Per chart review, patient is not medically stable for dc. ICM will continue to follow.   HF CSW/Unit CM will continue to follow and monitor for dc readiness.     Expected Discharge Plan: Home/Self Care Barriers to Discharge: Continued Medical Work up               Expected Discharge Plan and Services       Living arrangements for the past 2 months: Single Family Home                                       Social Drivers of Health (SDOH) Interventions SDOH Screenings   Food Insecurity: Low Risk (04/04/2024)   Received from [facility]  Housing: Low Risk (04/04/2024)   Received from [facility]  Transportation Needs: No Transportation Needs (04/04/2024)   Received from [facility]  Utilities: Low Risk (04/04/2024)   Received from [facility]  Tobacco Use: Medium Risk (01/28/2025)    Readmission Risk Interventions     No data to display

## 2025-01-31 NOTE — Progress Notes (Addendum)
 "    Advanced Heart Failure Rounding Note  Cardiologist: Gordy Bergamo, MD  [facility] Cardiologist: Dr. Cherrie  Chief Complaint: Cardiogenic shock  Patient Profile   [name] is a 74 y.o. female with history of MS and prior Takotsubo cardiomyopathy vs myocarditis in 2020 at [facility].  Significant events:   2/2: ACS with marked anterior STE. Cath w/ minimal CAD, LVEF 20% with apical ballooning. Moved to ICU with PA catheter and cardiogenic shock. Started milrinone  + Norepi.  Subjective:    Continues on 0.125 milrinone  + 2 NE. Coox is 67%.  CVP 5.   Able to ambulate yesterday. No shortness of breath, chest pain or lightheadedness.  Objective:   Weight Range: 57.6 kg Body mass index is 18.21 kg/m.   Vital Signs:   Temp:  [98.2 F (36.8 C)-99.5 F (37.5 C)] 98.2 F (36.8 C) (02/05 0352) Pulse Rate:  [85-132] 98 (02/05 0700) Resp:  [0-26] 14 (02/05 0700) BP: (74-120)/(40-88) 96/70 (02/05 0700) SpO2:  [83 %-100 %] 94 % (02/05 0700) Weight:  [57.6 kg] 57.6 kg (02/05 0500) Last BM Date : 01/30/25  Weight change: Filed Weights   01/28/25 1900 01/29/25 0500 01/31/25 0500  Weight: 59.5 kg 62 kg 57.6 kg    Intake/Output:   Intake/Output Summary (Last 24 hours) at 01/31/2025 0714 Last data filed at 01/31/2025 0711 Gross per 24 hour  Intake 576.39 ml  Output 1300 ml  Net -723.61 ml     Physical Exam   General:  Sitting up in chair. Neck: no JVD  Cor: Regular rate & rhythm. No murmurs. Lungs: clear Abdomen: soft, nontender, nondistended. Extremities: no edema Neuro: alert & orientedx3. Affect pleasant  Telemetry   SR 90s  Labs   CBC Recent Labs    01/29/25 0425 01/30/25 0519  WBC 12.6* 11.6*  HGB 13.6 12.7  HCT 40.8 38.5  MCV 93.2 94.1  PLT 218 159   Basic Metabolic Panel Recent Labs    97/95/73 0519 01/31/25 0534  NA 138 138  K 4.3 4.2  CL 103 103  CO2 29 27  GLUCOSE 132* 147*  BUN 18 15  CREATININE 0.81 0.67  CALCIUM  8.2* 8.6*  MG 2.3  2.1   Liver Function Tests No results for input(s): AST, ALT, ALKPHOS, BILITOT, PROT, ALBUMIN in the last 72 hours. No results for input(s): LIPASE, AMYLASE in the last 72 hours. Cardiac Enzymes No results for input(s): CKTOTAL, CKMB, CKMBINDEX, TROPONINI in the last 72 hours.  BNP: BNP (last 3 results) No results for input(s): BNP in the last 8760 hours.  ProBNP (last 3 results) No results for input(s): PROBNP in the last 8760 hours.   D-Dimer No results for input(s): DDIMER in the last 72 hours. Hemoglobin A1C Recent Labs    01/28/25 1635  HGBA1C 5.4   Fasting Lipid Panel Recent Labs    01/28/25 1635  CHOL 174  HDL 84  LDLCALC 63  TRIG 137  CHOLHDL 2.1   Medications:   Scheduled Medications:  apixaban   5 mg Oral BID   aspirin  EC  81 mg Oral Daily   Chlorhexidine  Gluconate Cloth  6 each Topical Daily   digoxin   0.125 mg Oral Daily   donepezil   10 mg Oral QHS   ezetimibe   10 mg Oral Daily   feeding supplement  237 mL Oral BID BM   insulin  aspart  0-5 Units Subcutaneous QHS   insulin  aspart  0-9 Units Subcutaneous TID WC   pantoprazole   40 mg  Oral Daily   rOPINIRole   0.25 mg Oral QHS   rosuvastatin   20 mg Oral QHS   sodium chloride  flush  10-40 mL Intracatheter Q12H   sodium chloride  flush  10-40 mL Intracatheter Q12H   sodium chloride  flush  3 mL Intravenous Q12H    Infusions:  milrinone  0.125 mcg/kg/min (01/31/25 0711)   norepinephrine  (LEVOPHED ) Adult infusion 2 mcg/min (01/31/25 0711)    PRN Medications: acetaminophen , ondansetron  (ZOFRAN ) IV, mouth rinse, sodium chloride  flush, sodium chloride  flush, sodium chloride  flush  Assessment/Plan   Cardiogenic shock - In setting of recurrent Takotsubo CM. Hx prior Takotsubo vs myocarditis in 2020. EF was 65% on echo in 2022.  - Significant anterior STE on presenting ECG. R/LHC w/ minimal CAD, LVEF 20% and apical ballooning. RA 5, PA 28/21 (25), PCWP 25, LVEDP 29, Fick CO/CI  2.7/1.57 - Lactic acid peaked 2.3>>cleared - Echo EF 25-30%, WMA c/w takotsubo cardiomyopathy, dynamic outflow tract obstruction w/ SAM and peak gradient of 40 mmHg with valsava, RV okay - Currently on 0.125 milrinone  + 2 NE. Co-ox stable. Stop milrinone . Wean NE as able. - Volume status low. No diuresis today. - Will review digoxin  with Dr. Tonette Koehne given LVOT gradient - No BP room for GDMT - Continue eliquis  until LV function improves - cMRI today. Has an implanted stimulator but reports it is MRI compatible.  MS - Continue home meds    CRITICAL CARE Performed by: COLLETTA SHAVER N   Total critical care time: 14 minutes  Critical care time was exclusive of separately billable procedures and treating other patients.  Critical care was necessary to treat or prevent imminent or life-threatening deterioration.  Critical care was time spent personally by me on the following activities: development of treatment plan with patient and/or surrogate as well as nursing, discussions with consultants, evaluation of patient's response to treatment, examination of patient, obtaining history from patient or surrogate, ordering and performing treatments and interventions, ordering and review of laboratory studies, ordering and review of radiographic studies, pulse oximetry and re-evaluation of patient's condition.    Length of Stay: 3  FINCH, LINDSAY N, PA-C  01/31/2025, 7:14 AM  Advanced Heart Failure Team Pager [phone] (M-F; 7a - 5p)   Please visit [email or web address]: For overnight coverage please call cardiology fellow first. If fellow not available call Shock/ECMO MD on call.  For ECMO / Mechanical Support (Impella, IABP, LVAD) issues call Shock / ECMO MD on call.    Agree with above. Remains on low-dose NE and milrinone . Breathing ok. SBP in 90s  No further CP  General:  Sitting up in bed. No resp difficulty HEENT: normal Neck: supple. no JVD.  Cor: Regular rate & rhythm. 2/6 SEM Lungs:  clear Abdomen: soft, nontender, nondistended.Good bowel sounds. Extremities: no cyanosis, clubbing, rash, edema Neuro: alert & orientedx3, cranial nerves grossly intact. moves all 4 extremities w/o difficulty. Affect pleasant  Still on NE and milrinone . Swan numbers ok. Will stop milrinone . Wean NE to keep SBP >=90.  Plan cMRI today. Unable to titrate GDMT currently due to low BP and need for NE   Await cMRI results, Ambulate   CRITICAL CARE Performed by: Cherrie Sieving  Total critical care time: 35 minutes  Critical care time was exclusive of separately billable procedures and treating other patients.  Critical care was necessary to treat or prevent imminent or life-threatening deterioration.  Critical care was time spent personally by me (independent of midlevel providers or residents) on the following activities: development of treatment plan  with patient and/or surrogate as well as nursing, discussions with consultants, evaluation of patient's response to treatment, examination of patient, obtaining history from patient or surrogate, ordering and performing treatments and interventions, ordering and review of laboratory studies, ordering and review of radiographic studies, pulse oximetry and re-evaluation of patient's condition.  Toribio Fuel, MD  10:53 PM   "

## 2025-02-01 LAB — BASIC METABOLIC PANEL WITH GFR
Anion gap: 8 (ref 5–15)
BUN: 15 mg/dL (ref 8–23)
CO2: 27 mmol/L (ref 22–32)
Calcium: 8.4 mg/dL — ABNORMAL LOW (ref 8.9–10.3)
Chloride: 105 mmol/L (ref 98–111)
Creatinine, Ser: 0.69 mg/dL (ref 0.44–1.00)
GFR, Estimated: >60 mL/min
Glucose, Bld: 167 mg/dL — ABNORMAL HIGH (ref 70–99)
Potassium: 3.8 mmol/L (ref 3.5–5.1)
Sodium: 139 mmol/L (ref 135–145)

## 2025-02-01 LAB — COOXEMETRY PANEL
Carboxyhemoglobin: 1.6 % — ABNORMAL HIGH (ref 0.5–1.5)
Carboxyhemoglobin: 1.6 % — ABNORMAL HIGH (ref 0.5–1.5)
Methemoglobin: <0.7 % (ref 0.0–1.5)
Methemoglobin: 0.7 % (ref 0.0–1.5)
O2 Saturation: 80.6 %
O2 Saturation: 78.1 %
Total hemoglobin: 13.3 g/dL (ref 12.0–16.0)
Total hemoglobin: 14.4 g/dL (ref 12.0–16.0)

## 2025-02-01 LAB — GLUCOSE, CAPILLARY
Glucose-Capillary: 103 mg/dL — ABNORMAL HIGH (ref 70–99)
Glucose-Capillary: 134 mg/dL — ABNORMAL HIGH (ref 70–99)
Glucose-Capillary: 143 mg/dL — ABNORMAL HIGH (ref 70–99)
Glucose-Capillary: 87 mg/dL (ref 70–99)

## 2025-02-01 LAB — MAGNESIUM: Magnesium: 2.1 mg/dL (ref 1.7–2.4)

## 2025-02-01 MED ORDER — MIDODRINE HCL 5 MG PO TABS
2.5000 mg | ORAL_TABLET | Freq: Two times a day (BID) | ORAL | Status: AC
  Administered 2025-02-01 (×2): 2.5 mg via ORAL
  Filled 2025-02-01 (×2): qty 1

## 2025-02-01 MED ORDER — MIDODRINE HCL 5 MG PO TABS: 2.5000 mg | ORAL_TABLET | Freq: Three times a day (TID) | ORAL | Status: DC

## 2025-02-01 MED ORDER — DICLOFENAC SODIUM 1 % EX GEL
2.0000 g | Freq: Four times a day (QID) | CUTANEOUS | Status: AC
  Administered 2025-02-01 (×4): 2 g via TOPICAL
  Filled 2025-02-01: qty 100

## 2025-02-01 MED ORDER — METOPROLOL SUCCINATE ER 25 MG PO TB24
25.0000 mg | ORAL_TABLET | Freq: Every day | ORAL | Status: AC
  Administered 2025-02-01: 25 mg via ORAL
  Filled 2025-02-01: qty 1

## 2025-02-01 MED ORDER — MIDODRINE HCL 5 MG PO TABS: 2.5000 mg | ORAL_TABLET | ORAL | Status: DC

## 2025-02-01 MED ORDER — POTASSIUM CHLORIDE CRYS ER 20 MEQ PO TBCR
40.0000 meq | EXTENDED_RELEASE_TABLET | Freq: Once | ORAL | Status: AC
  Administered 2025-02-01: 40 meq via ORAL
  Filled 2025-02-01: qty 2

## 2025-02-01 NOTE — Progress Notes (Addendum)
 "    Advanced Heart Failure Rounding Note  Cardiologist: Gordy Bergamo, MD  [facility] Cardiologist: Dr. Cherrie  Chief Complaint: Cardiogenic shock  Patient Profile   [name] is a 74 y.o. 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  Significant events:   2/2: ACS with marked anterior STE. Cath w/ minimal CAD, LVEF 20% with apical ballooning. Moved to ICU with PA catheter and cardiogenic shock. Started milrinone  + Norepi. 2/5: cMRI LVEF 33%, RV 68%, no LGE; findings c/w Takotsubo cardiomyopathy; incidental finding of LLL pulmonary nodule   Subjective:    Off Milrinone . Remains on low dose NE @ 3 mcg  Co-ox 81% (up from 60s). WBC 12K on admission but not f/u since. Afebrile. No infectious symptoms.   CVP 4.   Ambulated this morning and had mild DOE. No CP.   Objective:   Weight Range: 57.4 kg Body mass index is 18.16 kg/m.   Vital Signs:   Temp:  [97.6 F (36.4 C)-98.9 F (37.2 C)] 98 F (36.7 C) (02/05 2356) Pulse Rate:  [73-107] 85 (02/06 0700) Resp:  [10-30] 20 (02/06 0700) BP: (69-123)/(43-91) 112/74 (02/06 0700) SpO2:  [74 %-100 %] 90 % (02/06 0700) Weight:  [57.4 kg] 57.4 kg (02/06 0645) Last BM Date : 01/31/25  Weight change: Filed Weights   01/29/25 0500 01/31/25 0500 02/01/25 0645  Weight: 62 kg 57.6 kg 57.4 kg    Intake/Output:   Intake/Output Summary (Last 24 hours) at 02/01/2025 0848 Last data filed at 02/01/2025 0707 Gross per 24 hour  Intake 456.5 ml  Output 450 ml  Net 6.5 ml     Physical Exam   GENERAL: NAD Lungs- clear CARDIAC:  JVP not elevated         Normal rate with regular rhythm. 2/6 MR murmur.  No LEE ABDOMEN: Soft, non-tender, non-distended.  EXTREMITIES: Warm and well perfused.  NEUROLOGIC: No obvious FND   Telemetry   NSR 80s, personally reviewed   Labs   CBC Recent Labs    01/30/25 0519  WBC 11.6*  HGB 12.7  HCT 38.5  MCV 94.1  PLT 159   Basic Metabolic  Panel Recent Labs    01/31/25 0534 02/01/25 0355  NA 138 139  K 4.2 3.8  CL 103 105  CO2 27 27  GLUCOSE 147* 167*  BUN 15 15  CREATININE 0.67 0.69  CALCIUM  8.6* 8.4*  MG 2.1 2.1   Liver Function Tests No results for input(s): AST, ALT, ALKPHOS, BILITOT, PROT, ALBUMIN in the last 72 hours. No results for input(s): LIPASE, AMYLASE in the last 72 hours. Cardiac Enzymes No results for input(s): CKTOTAL, CKMB, CKMBINDEX, TROPONINI in the last 72 hours.  BNP: BNP (last 3 results) No results for input(s): BNP in the last 8760 hours.  ProBNP (last 3 results) No results for input(s): PROBNP in the last 8760 hours.   D-Dimer No results for input(s): DDIMER in the last 72 hours. Hemoglobin A1C No results for input(s): HGBA1C in the last 72 hours.  Fasting Lipid Panel No results for input(s): CHOL, HDL, LDLCALC, TRIG, CHOLHDL, LDLDIRECT in the last 72 hours.  Medications:   Scheduled Medications:  apixaban   5 mg Oral BID   aspirin  EC  81 mg Oral Daily   baclofen   10 mg Oral QHS   Chlorhexidine  Gluconate Cloth  6 each Topical Daily   digoxin   0.125 mg Oral Daily   donepezil   10 mg Oral QHS  ezetimibe   10 mg Oral Daily   feeding supplement  237 mL Oral BID BM   insulin  aspart  0-5 Units Subcutaneous QHS   insulin  aspart  0-9 Units Subcutaneous TID WC   pantoprazole   40 mg Oral Daily   rOPINIRole   0.25 mg Oral QHS   rosuvastatin   20 mg Oral QHS   sodium chloride  flush  10-40 mL Intracatheter Q12H   sodium chloride  flush  10-40 mL Intracatheter Q12H   sodium chloride  flush  3 mL Intravenous Q12H    Infusions:  norepinephrine  (LEVOPHED ) Adult infusion 3 mcg/min (02/01/25 0707)    PRN Medications: acetaminophen , ondansetron  (ZOFRAN ) IV, mouth rinse, sodium chloride  flush, sodium chloride  flush, sodium chloride  flush  Assessment/Plan   Cardiogenic shock/Systolic Heart Failure/Takotsubo CM  - In setting of recurrent Takotsubo  CM. Hx prior Takotsubo vs myocarditis in 2020. EF was 65% on echo in 2022.  - Significant anterior STE on presenting ECG. R/LHC w/ minimal CAD, LVEF 20% and apical ballooning. RA 5, PA 28/21 (25), PCWP 25, LVEDP 29, Fick CO/CI 2.7/1.57 - Lactic acid peaked 2.3>>cleared - Echo EF 25-30%, WMA c/w takotsubo cardiomyopathy, dynamic outflow tract obstruction w/ SAM and peak gradient of 40 mmHg with valsava, RV okay - cMRI c/w Takotsubo cardiomyopathy normal LV basal systolic function with mid to apical AK, no LGE, LVEF 33%, RV nl - Volume status low, CVP 4. No diuretics today  - start midodrine  2.5 mg bid to help wean off NE  - start Toprol  XL 25 mg daily   - Continue digoxin  0.125 mg  - Continue eliquis  until LV function improves  MS - Continue home meds  LLL pulmonary nodule  - incidental finding on cMRI  - pt reports she has had this for years and followed   CRITICAL CARE Performed by: [name]   Total critical care time: 15 minutes  Critical care time was exclusive of separately billable procedures and treating other patients.  Critical care was necessary to treat or prevent imminent or life-threatening deterioration.  Critical care was time spent personally by me on the following activities: development of treatment plan with patient and/or surrogate as well as nursing, discussions with consultants, evaluation of patient's response to treatment, examination of patient, obtaining history from patient or surrogate, ordering and performing treatments and interventions, ordering and review of laboratory studies, ordering and review of radiographic studies, pulse oximetry and re-evaluation of patient's condition.    Length of Stay: [address], [name]  02/01/2025, 8:48 AM  Advanced Heart Failure Team Pager [phone] (M-F; 7a - 5p)   Please visit [email or web address]: For overnight coverage please call cardiology fellow first. If fellow not available call Shock/ECMO MD on call.   For ECMO / Mechanical Support (Impella, IABP, LVAD) issues call Shock / ECMO MD on call.    Agree with above.   Remains on low-dose NE to maintain BP. Feels ok   cMRI results reviewed  Denies CP or SOB  General:  Sitting up in bed. No resp difficulty HEENT: normal Neck: supple. no JVD.  Cor: Regular rate & rhythm. 2/6 SEM LSB increases with valsalve Lungs: clear Abdomen: soft, nontender, nondistended.Good bowel sounds. Extremities: no cyanosis, clubbing, rash, edema Neuro: alert & orientedx3, cranial nerves grossly intact. moves all 4 extremities w/o difficulty. Affect pleasant  cMRI consistent with Tako-Tsubl CM (versus acute myocarditis)  Continues to require NE for BP support. Management c/b dynamic LVOT obstruction  Will add midodrine  to help wean NE. Start Toprol . Avoid  diuresis   CRITICAL CARE Performed by: [name]  Total critical care time: 34 minutes  Critical care time was exclusive of separately billable procedures and treating other patients.  Critical care was necessary to treat or prevent imminent or life-threatening deterioration.  Critical care was time spent personally by me (independent of midlevel providers or residents) on the following activities: development of treatment plan with patient and/or surrogate as well as nursing, discussions with consultants, evaluation of patient's response to treatment, examination of patient, obtaining history from patient or surrogate, ordering and performing treatments and interventions, ordering and review of laboratory studies, ordering and review of radiographic studies, pulse oximetry and re-evaluation of patient's condition.   [name] Cherrie, MD  4:40 PM    "

## 2025-02-01 NOTE — Progress Notes (Signed)
 Occupational Therapy Treatment Patient Details Name: [name] MRN: [id number] DOB: [birth year] Today's Date: 02/01/2025   History of present illness Pt is 74 year old presented to [facility] on  01/28/25 for chest pain. Pt with cardiogenic shock in the setting of recurrent Takotsubo cardiomyopathy.  PMH - rt TSA (October 2025), ACDF, asthma, migraines, Takotsubo cardiomyopathy, heart murmur, hiatal hernia, MS, myocarditis, occipital neuralgia, transverse myelitis   OT comments  Pt states she feels much better. Husband expresses concern regarding her BP. After session, BP 98/73. Began education regarding E cons and strategies to reduce risk of falls. Pt complaining of dizziness with L side rolling- will ask for vestibular therapist to see if possible. Also c/o neck pain  - heat provided.Recommend equip below for safe DC home. Acute OT will follow to facilitate safe DC home, however no OT follow up needed after DC.       If plan is discharge home, recommend the following:  A little help with walking and/or transfers;A little help with bathing/dressing/bathroom;Assistance with cooking/housework;Assist for transportation;Help with stairs or ramp for entrance   Equipment Recommendations  BSC/3in1    Recommendations for Other Services      Precautions / Restrictions Precautions Precautions: Fall;Other (comment) Precaution/Restrictions Comments: watch BP       Mobility Bed Mobility Overal bed mobility: Modified Independent                  Transfers Overall transfer level: Needs assistance Equipment used: Rolling walker (2 wheels) Transfers: Sit to/from Stand Sit to Stand: Supervision           General transfer comment: able to complete 10 sit<> stand before stating she felt her heart was racing; HR stable     Balance     Sitting balance-Leahy Scale: Good       Standing balance-Leahy Scale: Fair Standing balance comment: however pt states she feels unsteady                            ADL either performed or assessed with clinical judgement   ADL                                       Functional mobility during ADLs: Rolling walker (2 wheels);Supervision/safety General ADL Comments: overall set up for ADL tasks @ RW level. Discussed using 3in1 by bed at night due to increased issues with incontinence - follows with a specialist for her incontinence - states increased difficulty since this hospitalization; began education regarding E cons techniques    Extremity/Trunk Assessment Upper Extremity Assessment Upper Extremity Assessment: Generalized weakness;RUE deficits/detail RUE Deficits / Details: recent R TSA 10/25 with Dr Josefina - currently undergoing rehab with PT RUE Coordination: decreased gross motor            Vision       Perception     Praxis     Communication     Cognition Arousal: Alert Behavior During Therapy: [facility] for tasks assessed/performed Cognition: No apparent impairments                                        Cueing      Exercises      Shoulder Instructions       General  Comments      Pertinent Vitals/ Pain       Pain Assessment Pain Assessment: Faces Faces Pain Scale: Hurts little more Pain Location: R shoulder Pain Descriptors / Indicators: Discomfort, Sore Pain Intervention(s): Limited activity within patient's tolerance  Home Living                                          Prior Functioning/Environment              Frequency  Min 2X/week        Progress Toward Goals  OT Goals(current goals can now be found in the care plan section)  Progress towards OT goals: Progressing toward goals  Acute Rehab OT Goals Patient Stated Goal: get stronger OT Goal Formulation: With patient Time For Goal Achievement: 02/13/25 Potential to Achieve Goals: Good ADL Goals Pt Will Perform Grooming: with modified independence;standing Pt Will  Perform Lower Body Dressing: with modified independence;sit to/from stand Pt Will Transfer to Toilet: with modified independence;ambulating Additional ADL Goal #1: pt will identify and implement 2+ energy conservation techniques for use in the home setting. Additional ADL Goal #2: pt will perform 10 + mins OOB ADL with no more than supervision  Plan      Co-evaluation                 AM-PAC OT 6 Clicks Daily Activity     Outcome Measure   Help from another person eating meals?: None Help from another person taking care of personal grooming?: A Little Help from another person toileting, which includes using toliet, bedpan, or urinal?: A Little Help from another person bathing (including washing, rinsing, drying)?: A Little Help from another person to put on and taking off regular upper body clothing?: A Little Help from another person to put on and taking off regular lower body clothing?: A Little 6 Click Score: 19    End of Session Equipment Utilized During Treatment: Rolling walker (2 wheels)  OT Visit Diagnosis: Unsteadiness on feet (R26.81);Muscle weakness (generalized) (M62.81);Other (comment)   Activity Tolerance Patient tolerated treatment well   Patient Left in bed;with call bell/phone within reach;with family/visitor present   Nurse Communication Mobility status        Time: 8542-8484 OT Time Calculation (min): 18 min  Charges: OT General Charges $OT Visit: 1 Visit OT Treatments $Self Care/Home Management : 8-22 mins  Kreg Sink, OT/L   Acute OT Clinical Specialist Acute Rehabilitation Services Pager [phone] Office [phone]   [facility] 02/01/2025, 3:40 PM

## 2025-02-25 ENCOUNTER — Ambulatory Visit: Admitting: Family Medicine
